# Patient Record
Sex: Male | Born: 1940 | Race: Black or African American | Hispanic: No | Marital: Married | Smoking: Former smoker
Health system: Southern US, Community
[De-identification: ages and names within clinical notes are randomized; demographics above are authoritative.]

## PROBLEM LIST (undated history)

## (undated) DIAGNOSIS — E785 Hyperlipidemia, unspecified: Secondary | ICD-10-CM

## (undated) DIAGNOSIS — E119 Type 2 diabetes mellitus without complications: Secondary | ICD-10-CM

## (undated) DIAGNOSIS — D126 Benign neoplasm of colon, unspecified: Secondary | ICD-10-CM

## (undated) DIAGNOSIS — G4730 Sleep apnea, unspecified: Secondary | ICD-10-CM

## (undated) DIAGNOSIS — I10 Essential (primary) hypertension: Secondary | ICD-10-CM

## (undated) HISTORY — DX: Hyperlipidemia, unspecified: E78.5

## (undated) HISTORY — DX: Sleep apnea, unspecified: G47.30

---
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
## 2000-11-21 ENCOUNTER — Encounter: Payer: Self-pay | Admitting: *Deleted

## 2000-11-21 ENCOUNTER — Emergency Department (HOSPITAL_COMMUNITY): Admission: EM | Admit: 2000-11-21 | Discharge: 2000-11-21 | Payer: Self-pay | Admitting: *Deleted

## 2000-11-29 ENCOUNTER — Ambulatory Visit (HOSPITAL_COMMUNITY): Admission: RE | Admit: 2000-11-29 | Discharge: 2000-11-29 | Payer: Self-pay | Admitting: Cardiology

## 2001-04-22 ENCOUNTER — Emergency Department (HOSPITAL_COMMUNITY): Admission: EM | Admit: 2001-04-22 | Discharge: 2001-04-22 | Payer: Self-pay

## 2001-05-23 ENCOUNTER — Encounter: Payer: Self-pay | Admitting: Urology

## 2001-05-23 ENCOUNTER — Ambulatory Visit (HOSPITAL_BASED_OUTPATIENT_CLINIC_OR_DEPARTMENT_OTHER): Admission: RE | Admit: 2001-05-23 | Discharge: 2001-05-23 | Payer: Self-pay | Admitting: Urology

## 2005-06-07 ENCOUNTER — Ambulatory Visit: Payer: Self-pay | Admitting: Orthopedic Surgery

## 2005-07-14 ENCOUNTER — Ambulatory Visit: Payer: Self-pay | Admitting: Orthopedic Surgery

## 2006-06-13 ENCOUNTER — Ambulatory Visit: Payer: Self-pay | Admitting: Orthopedic Surgery

## 2006-06-22 ENCOUNTER — Ambulatory Visit: Payer: Self-pay | Admitting: Gastroenterology

## 2006-06-22 LAB — CONVERTED CEMR LAB
Ferritin: 18.4 ng/mL — ABNORMAL LOW (ref 22.0–322.0)
Folate: >20 ng/mL
Iron: 64 ug/dL (ref 42–165)
Saturation Ratios: 17.1 % — ABNORMAL LOW (ref 20.0–50.0)
Transferrin: 266.7 mg/dL (ref >=212.0)
Vitamin B-12: 411 pg/mL (ref 211–911)

## 2006-07-03 DIAGNOSIS — D126 Benign neoplasm of colon, unspecified: Secondary | ICD-10-CM

## 2006-07-03 HISTORY — DX: Benign neoplasm of colon, unspecified: D12.6

## 2006-07-13 ENCOUNTER — Ambulatory Visit: Payer: Self-pay | Admitting: Gastroenterology

## 2006-07-13 ENCOUNTER — Encounter: Payer: Self-pay | Admitting: Gastroenterology

## 2006-07-13 LAB — CONVERTED CEMR LAB
Fecal Occult Blood: NEGATIVE
OCCULT 1: NEGATIVE
OCCULT 2: NEGATIVE
OCCULT 3: NEGATIVE
OCCULT 4: NEGATIVE
OCCULT 5: NEGATIVE

## 2008-11-11 DIAGNOSIS — M1990 Unspecified osteoarthritis, unspecified site: Secondary | ICD-10-CM

## 2008-11-11 HISTORY — DX: Unspecified osteoarthritis, unspecified site: M19.90

## 2009-02-01 HISTORY — PX: CATARACT EXTRACTION W/ INTRAOCULAR LENS  IMPLANT, BILATERAL: SHX1307

## 2010-06-16 NOTE — Assessment & Plan Note (Signed)
[facility]                         GASTROENTEROLOGY OFFICE NOTE   LORN, BUTCHER                         MRN:          [id number]  DATE:06/22/2006                            DOB:          [birth year]    OFFICE CONSULTATION:   REASON FOR REFERRAL:  Anemia.   HISTORY OF PRESENT ILLNESS:  Mr. Talarico is a very nice 70 year old AA male  referred through the courtesy of Dr. Jacky Kindle.  He underwent a screening  colonoscopy in March 2004 that showed internal hemorrhoids and three  small colon polyps, which proved to be hyperplastic.  During a routine  physical examination he was found to have a normocytic anemia with a  hemoglobin of 11.7 and an MCV of 82.  He denies any prior history of  anemia.  He occasionally has some mild itching related to his  hemorrhoids but he denies any rectal bleeding, change in stool caliber,  change in bowel habits, diarrhea, constipation, abdominal pain, rectal  pain, weight loss, nausea, vomiting or reflux symptoms.  He relates some  occasional dull left-sided chest pain that has been occurring  intermittently for about the past 2 to 3 years since he began Toprol.  There is no family history of colon cancer, colon polyps, or  inflammatory bowel disease.   PAST MEDICAL HISTORY:  1. Hypertension.  2. Diabetes mellitus.  3. Hyperlipidemia.  4. Internal hemorrhoids.  5. Hyperplastic colon polyps.  6. Status post circumcision in 2003.   CURRENT MEDICATIONS:  Listed on the chart, updated and reviewed.   MEDICATION ALLERGIES:  None known.   SOCIAL HISTORY:  Per the handwritten form.   REVIEW OF SYSTEMS:  Per the handwritten form.   PHYSICAL EXAMINATION:  GENERAL:  This is an overweight African-American  male in no acute distress.  VITAL SIGNS:  Height is 6 feet, weight 228.2 pounds.  Blood pressure is  146/66, pulse 88 and regular.  HEENT:  Anicteric sclerae.  Oropharynx clear.  CHEST:  Clear to auscultation  bilaterally.  CARDIAC:  Regular rate and rhythm without murmurs appreciated.  ABDOMEN:  Soft, nontender, nondistended, normoactive bowel sounds.  No  palpable organomegaly, masses or hernias.  RECTAL:  Deferred to the time of colonoscopy.  EXTREMITIES:  Without clubbing, cyanosis, or edema.  NEUROLOGIC:  Alert and oriented x3.  Grossly nonfocal.   ASSESSMENT AND PLAN:  Normocytic anemia.  Obtain iron, B12 and folate  studies.  Rule out occult gastrointestinal losses.  Obtain stool  Hemoccults.  Risks, benefits, and alternatives to colonoscopy with  possible biopsy and possible polypectomy and upper endoscopy with  possible biopsy discussed with the patient.  He consents to proceed and  this will be scheduled electively.     Venita Lick. Russella Dar, MD, [facility]  Electronically Signed    MTS/MedQ  DD: 06/22/2006  DT: 06/22/2006  Job #: [id number]   cc:   Geoffry Paradise, M.D.

## 2010-06-19 NOTE — Cardiovascular Report (Signed)
[place]. [facility]  Patient:    ROTH, RESS Visit Number: [id number] MRN: [id number]          Service Type: CAT Location: [facility] 2855 [id number] Attending Physician:  [place], Peter Manning Dictated by:   Peter M. [place], M.D. Proc. Date: 11/29/00 Admit Date:  11/29/2000 Discharge Date: 11/29/2000                          Cardiac Catheterization  INDICATIONS FOR PROCEDURE: The patient is a 70 year old black male with a history of diabetes mellitus, elevated blood pressure, mild hyperlipidemia, who has recent onset of chest pain. Stress test was abnormal.  ACCESS: Via the right femoral artery using the standard Seldinger technique.  EQUIPMENT: The 6 French 4 cm right and left Judkins catheter, 6 French pigtail catheter, 6 French arterial sheath.  MEDICATIONS: Local anesthesia with 1% Xylocaine.  CONTRAST: Omnipaque 120 cc.  HEMODYNAMIC DATA: Aortic pressure is 138/81 with a mean of 106.  Left ventricular pressure is 135 with an EDP of 17 mmHg.  ANGIOGRAPHIC DATA: Left coronary artery: The left coronary artery arises and distributes normally.  Left main: The left main coronary artery is normal.  Left anterior descending: The left anterior descending artery is normal. It supplies the two diagonal branches which are moderate in size and are also normal.  Left circumflex: The left circumflex coronary artery gives rise to a single marginal branch and this is normal.  Right coronary artery: The right coronary artery arises and distributes normally. It is a dominant vessel. It has minor wall irregularities in the proximal and mid vessel less than 10%.  LEFT VENTRICULAR ANGIOGRAPHY: The left ventricular angiography performed in the RAO view demonstrates normal left ventricular size and contractility with normal systolic function.  Ejection fraction is calculated at 67%. There is mild mitral insufficiency. There is no evidence of mitral valve prolapse.   The aortic valve appears normal.  FINAL INTERPRETATION: 1. No significant atherosclerotic coronary artery disease. 2. Normal left ventricular function. Dictated by:   Peter M. [place], M.D. Attending Physician:  [place], Peter Manning DD:  11/29/00 TD:  11/29/00 Job: [id number]

## 2010-06-19 NOTE — Op Note (Signed)
[facility]  Patient:    Carlos Gill, Carlos Gill Visit Number: [id number] MRN: [id number]          Service Type: NES Location: [facility] Attending Physician:  Evlyn Clines Dictated by:   Excell Seltzer. Annabell Howells, M.D. Proc. Date: 05/23/01 Admit Date:  05/23/2001   CC:         Richard A. Jacky Kindle, M.D.   Operative Report  PREOPERATIVE DIAGNOSIS:  Phimosis.  POSTOPERATIVE DIAGNOSIS:  Phimosis.  PROCEDURE:  Circumcision.  SURGEON:  Excell Seltzer. Annabell Howells, M.D.  ANESTHESIA:  General.  COMPLICATIONS:  None.  INDICATIONS:  The patient is a 70 year old black male with diabetes, who developed phimosis, and is to undergo circumcision.  FINDINGS AND DESCRIPTION OF PROCEDURE:  The patient was taken to the operating room where a general anesthetic was induced.  He was left in the supine position.  The penis and genitalia was prepped with Betadine solution, and he was draped in the usual sterile fashion.  A sleeve resection of the redundant foreskin was performed.  Hemostasis was achieved with the needle-tip Bovie. The skin edges were then reapproximated using a running 4-0 chromic interrupted in the quadrants.  At the completion of the circumcision, the patient was noted to have an appropriate amount of skin on stretch length to avoid any scrotal tethering with erection.  There was slightly redundancy when it was slashed, but I felt it best to leave it a little bit loose to ensure that he did not have tethering with the erection.  A dressing of Xeroform, Kling, and Coban was applied.  Approximately 8 cc of 1% lidocaine without epinephrine was used to perform a penile block for postoperative pain control.  His anesthetic was reversed.  He was taken to the recovery room in stable condition and there were no complications. Dictated by:   Excell Seltzer. Annabell Howells, M.D. Attending Physician:  Evlyn Clines DD:  05/23/01 TD:  05/23/01 Job: [id number]

## 2010-06-19 NOTE — H&P (Signed)
[place]. [facility]  Patient:    Carlos Gill, Carlos Gill Visit Number: [id number] MRN: [id number]          Service Type: EMS Location: [facility] Attending Physician:  Carmelina Peal Dictated by:   Peter M. [place], M.D. Admit Date:  11/21/2000 Discharge Date: 11/21/2000   CC:         Richard A. Jacky Kindle, M.D.                         History and Physical  CHIEF COMPLAINT: Chest pain.  HISTORY OF PRESENT ILLNESS: Carlos Gill is a 70 year old black male, with a history of diabetes mellitus, type 2, who presented for evaluation of chest pain.  The patient reports a one week history of substernal chest pain.  This is described as a pain and a pressure in the middle of his chest, radiating to his left arm.  It is worse with exertion but also has occurred with rest.  The patient was seen in the emergency room on November 21, 2000.  At that time his cardiac evaluation was unremarkable, with negative cardiac enzymes and ECG, and he was discharged home for further evaluation.  He was subsequently evaluated here on November 24, 2000.  He was started on Toprol XL and aspirin. He was scheduled for a stress Cardiolite study, which was performed on November 28, 2000.  On this study he was able to exercise for six minutes on the Bruce protocol.  He complained of marked fatigue and dyspnea.  He had no chest pain. ECG showed 3 mm of ST segment depression inferolaterally consistent with ischemia.  His Cardiolite study suggested apical ischemia.  He is now admitted for cardiac catheterization.  PAST MEDICAL HISTORY:  1. Diabetes mellitus, type 2.  2. History of mild hypercholesterolemia.  3. History of elevated blood pressure, but has not been treated for     hypertension in the past.  ALLERGIES: None known.  PAST SURGICAL HISTORY: No prior surgeries.  CURRENT MEDICATIONS:  1. Glucovance 25/500 mg b.i.d.  2. Toprol XL 50 mg q.d.  3. Aspirin q.d.  SOCIAL HISTORY: The patient is retired.   He is widowed.  He has one child, in good health.  He quit smoking ten years ago.  He denies alcohol use.  FAMILY HISTORY: Father died at age 72 of suicide.  Mother died at 61 of unknown causes.  One brother died with a brain aneurysm and one died in an accident.  Five other siblings are alive and well.  REVIEW OF SYSTEMS: The patient does note some increased fatigability and shortness of breath.  He has had no edema or orthopnea.  No history of TIA or stroke.  No claudication symptoms.  All other Review Of Systems are negative.  PHYSICAL EXAMINATION:  GENERAL: The patient is a pleasant white male, in no apparent distress.  VITAL SIGNS: Weight 224 pounds.  Blood pressure 140/80, pulse 68 and regular.  HEENT: Unremarkable.  PERRLA.  Extraocular movements were full.  Oropharynx clear.  NECK: Supple without JVD, adenopathy, thyromegaly, or bruits.  LUNGS: Clear to auscultation and percussion.  CARDIAC: Regular rate and rhythm without gallops, murmurs, rubs, or clicks.  ABDOMEN: Soft, nontender.  No hepatosplenomegaly, masses, or bruits.  EXTREMITIES: Femoral and pedal pulses are 2+ and symmetric.  No edema.  NEUROLOGIC: Nonfocal.  ASSESSMENT:  1. Recent onset of angina pectoris with abnormal stress Cardiolite study.  2. Diabetes mellitus, type 2.  3. Mild  hypercholesterolemia.  4. Hypertension.  PLAN: The patient will be admitted for cardiac catheterization, with further therapy pending these results. Dictated by:   Peter M. [place], M.D. Attending Physician:  Carmelina Peal DD:  11/28/00 TD:  11/29/00 Job: [id number]

## 2010-10-08 ENCOUNTER — Telehealth: Payer: Self-pay | Admitting: Cardiology

## 2010-10-08 NOTE — Telephone Encounter (Signed)
Nevermind the chart was purged.

## 2010-10-15 ENCOUNTER — Other Ambulatory Visit: Payer: Self-pay | Admitting: Specialist

## 2010-10-15 ENCOUNTER — Encounter (HOSPITAL_COMMUNITY): Payer: Medicare Other

## 2010-10-15 ENCOUNTER — Other Ambulatory Visit (HOSPITAL_COMMUNITY): Payer: Self-pay | Admitting: Specialist

## 2010-10-15 ENCOUNTER — Ambulatory Visit (HOSPITAL_COMMUNITY)
Admission: RE | Admit: 2010-10-15 | Discharge: 2010-10-15 | Disposition: A | Discharge status: Home / Self Care | Payer: Medicare Other | Source: Ambulatory Visit | Attending: Specialist | Admitting: Specialist

## 2010-10-15 DIAGNOSIS — Z01812 Encounter for preprocedural laboratory examination: Secondary | ICD-10-CM | POA: Insufficient documentation

## 2010-10-15 DIAGNOSIS — Z01818 Encounter for other preprocedural examination: Secondary | ICD-10-CM

## 2010-10-15 DIAGNOSIS — M7500 Adhesive capsulitis of unspecified shoulder: Secondary | ICD-10-CM | POA: Insufficient documentation

## 2010-10-15 DIAGNOSIS — M25819 Other specified joint disorders, unspecified shoulder: Secondary | ICD-10-CM | POA: Insufficient documentation

## 2010-10-15 LAB — BASIC METABOLIC PANEL
BUN: 23 mg/dL (ref 6–23)
CO2: 25 mEq/L (ref 19–32)
Calcium: 9.9 mg/dL (ref 8.4–10.5)
Chloride: 105 mEq/L (ref 96–112)
Creatinine, Ser: 1.53 mg/dL — ABNORMAL HIGH (ref 0.50–1.35)
GFR calc Af Amer: 55 mL/min — ABNORMAL LOW (ref >60)
GFR calc non Af Amer: 45 mL/min — ABNORMAL LOW (ref >60)
Glucose, Bld: 78 mg/dL (ref 70–99)
Potassium: 4.3 mEq/L (ref 3.5–5.1)
Sodium: 138 mEq/L (ref 135–145)

## 2010-10-15 LAB — SURGICAL PCR SCREEN
MRSA, PCR: NEGATIVE
Staphylococcus aureus: NEGATIVE

## 2010-10-15 LAB — CBC
HCT: 36.9 % — ABNORMAL LOW (ref 39.0–52.0)
Hemoglobin: 12.2 g/dL — ABNORMAL LOW (ref 13.0–17.0)
MCH: 27 pg (ref 26.0–34.0)
MCHC: 33.1 g/dL (ref 30.0–36.0)
MCV: 81.6 fL (ref 78.0–100.0)
Platelets: 189 10*3/uL (ref 150–400)
RBC: 4.52 MIL/uL (ref 4.22–5.81)
RDW: 13.6 % (ref 11.5–15.5)
WBC: 4.5 10*3/uL (ref 4.0–10.5)

## 2010-10-15 IMAGING — CR DG CHEST 2V
2 series · 2 of 2 positions shown · non-contrast
Comparison: None.

CLINICAL DATA: Preop, shoulder surgery

CHEST - 2 VIEW

[w chest pa]
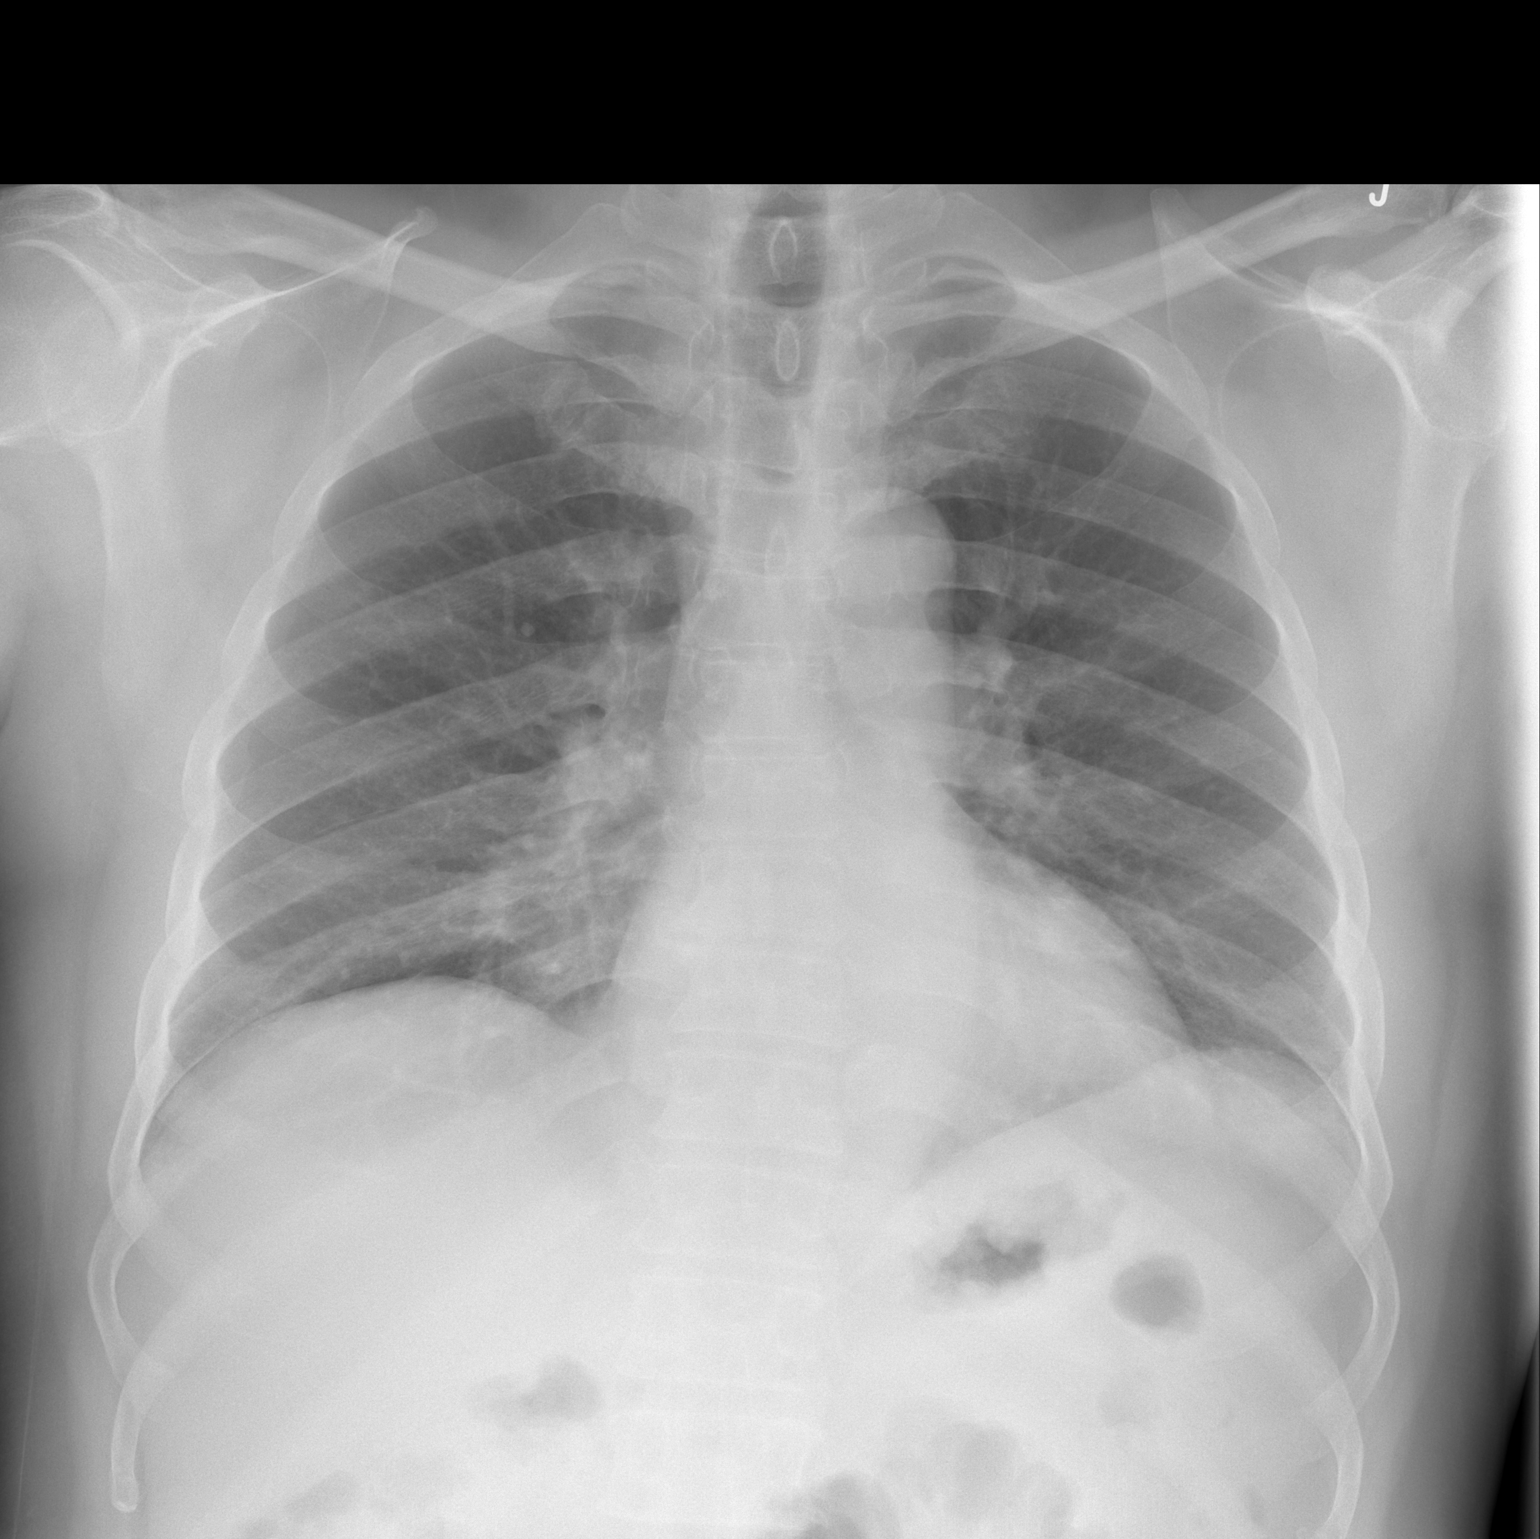

[w chest lat]
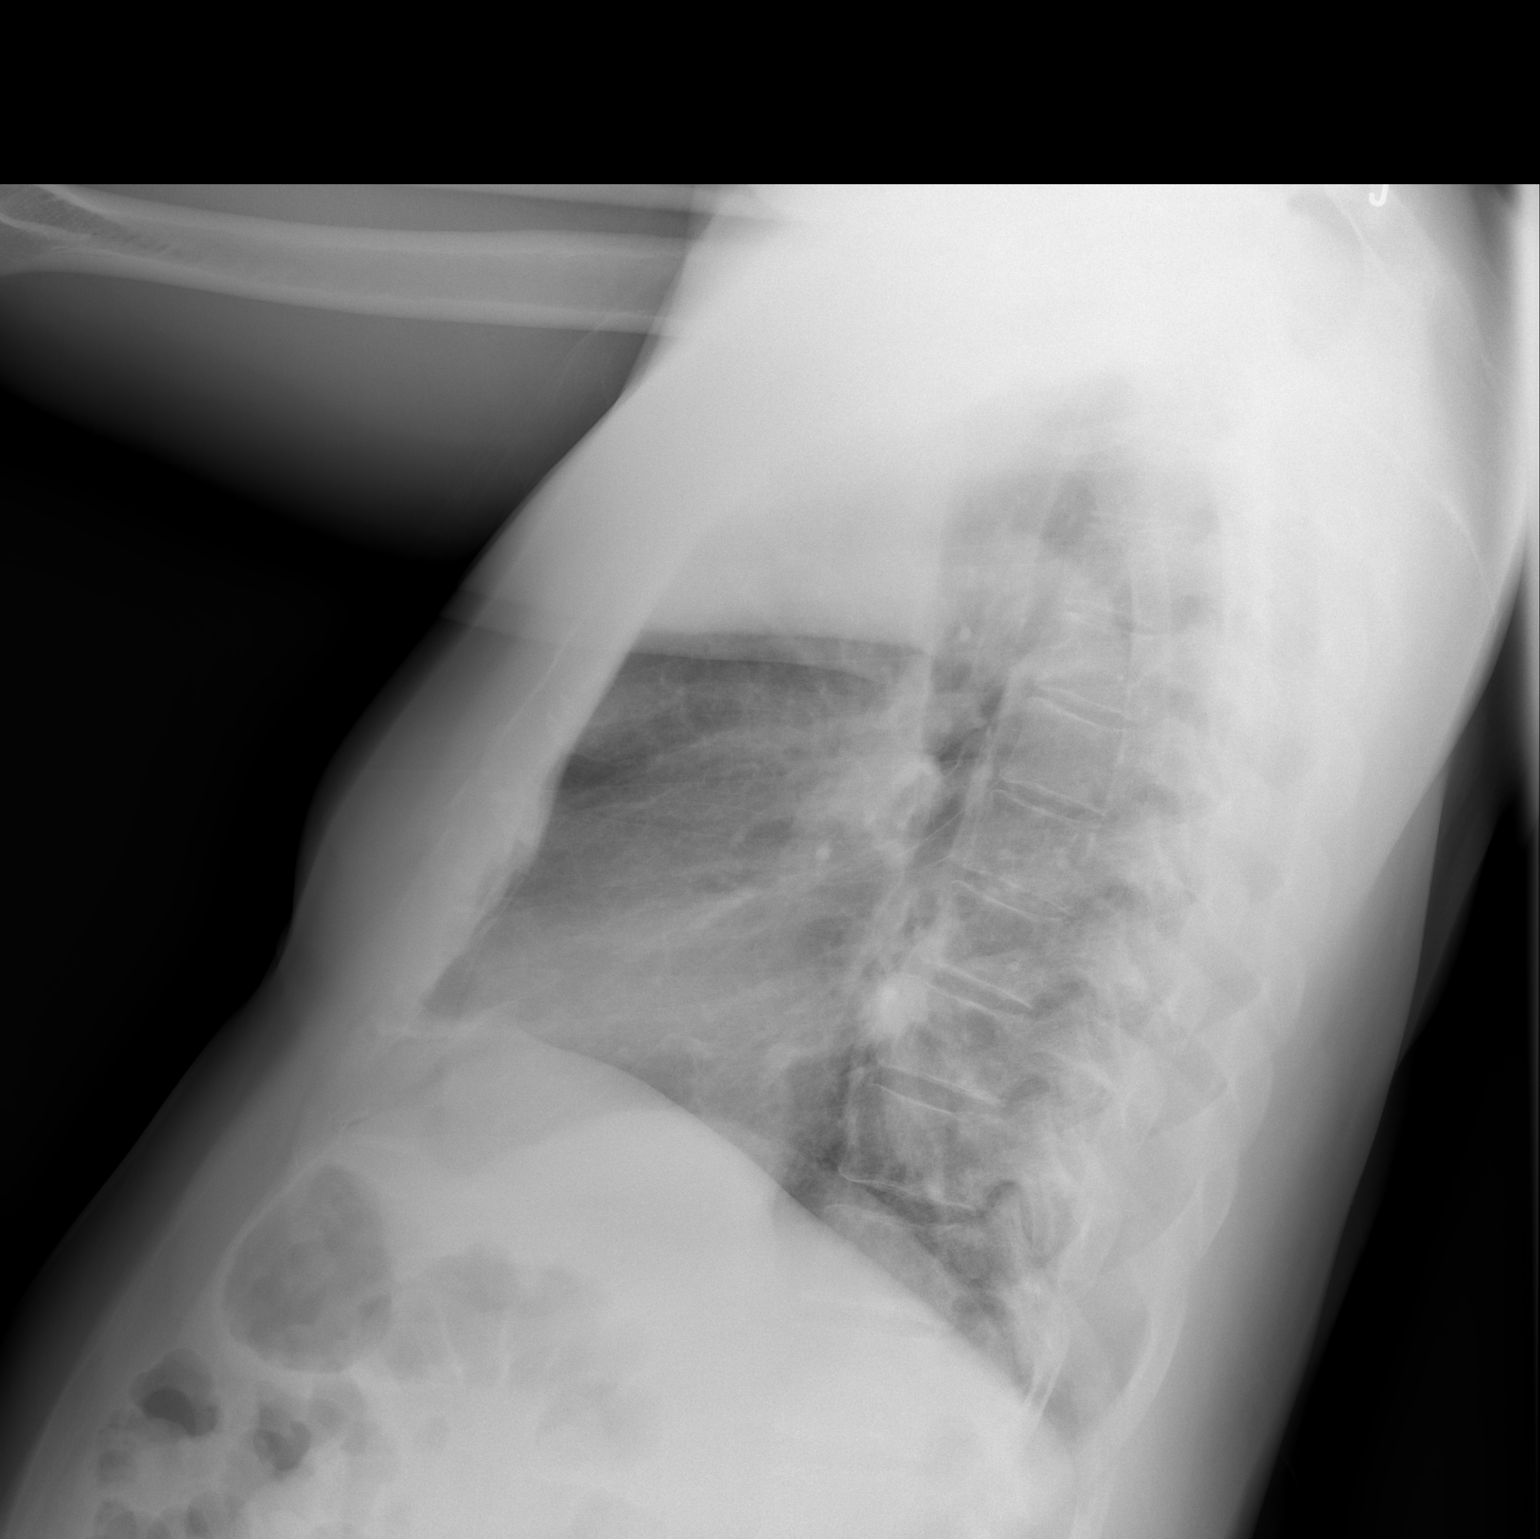

[2 of 2 positions shown; findings below may reference images not displayed]

FINDINGS: Normal mediastinum and heart silhouette.  Costophrenic
angles are clear.  No evidence effusion, infiltrate, or
pneumothorax.  The lateral projection there is a infrahilar nodular
density projecting over the anterior margin of the thoracic spine.
This is not placed on the anterior projection.
IMPRESSION: 1.  No acute cardiopulmonary process.
2.  Nodular density on the lateral projection is indeterminate.
Recommend CT of the thorax for further evaluation.

## 2010-10-22 ENCOUNTER — Ambulatory Visit (HOSPITAL_COMMUNITY): Payer: Medicare Other

## 2010-10-22 ENCOUNTER — Ambulatory Visit (HOSPITAL_COMMUNITY)
Admission: RE | Admit: 2010-10-22 | Discharge: 2010-10-22 | Disposition: A | Discharge status: Home / Self Care | Payer: Medicare Other | Source: Ambulatory Visit | Attending: Specialist | Admitting: Specialist

## 2010-10-22 DIAGNOSIS — E109 Type 1 diabetes mellitus without complications: Secondary | ICD-10-CM | POA: Insufficient documentation

## 2010-10-22 DIAGNOSIS — R748 Abnormal levels of other serum enzymes: Secondary | ICD-10-CM | POA: Insufficient documentation

## 2010-10-22 DIAGNOSIS — M25819 Other specified joint disorders, unspecified shoulder: Secondary | ICD-10-CM | POA: Insufficient documentation

## 2010-10-22 DIAGNOSIS — Z01810 Encounter for preprocedural cardiovascular examination: Secondary | ICD-10-CM | POA: Insufficient documentation

## 2010-10-22 DIAGNOSIS — M7500 Adhesive capsulitis of unspecified shoulder: Secondary | ICD-10-CM | POA: Insufficient documentation

## 2010-10-22 DIAGNOSIS — Z01812 Encounter for preprocedural laboratory examination: Secondary | ICD-10-CM | POA: Insufficient documentation

## 2010-10-22 DIAGNOSIS — I10 Essential (primary) hypertension: Secondary | ICD-10-CM | POA: Insufficient documentation

## 2010-10-22 LAB — GLUCOSE, CAPILLARY
Glucose-Capillary: 132 mg/dL — ABNORMAL HIGH (ref 70–99)
Glucose-Capillary: 158 mg/dL — ABNORMAL HIGH (ref 70–99)

## 2010-10-22 LAB — BASIC METABOLIC PANEL
BUN: 31 mg/dL — ABNORMAL HIGH (ref 6–23)
CO2: 24 mEq/L (ref 19–32)
Calcium: 9.1 mg/dL (ref 8.4–10.5)
Chloride: 105 mEq/L (ref 96–112)
Creatinine, Ser: 1.58 mg/dL — ABNORMAL HIGH (ref 0.50–1.35)
Glucose, Bld: 137 mg/dL — ABNORMAL HIGH (ref 70–99)

## 2010-10-23 NOTE — Op Note (Signed)
NAMEASHDON, GILLSON                  ACCOUNT NO.:  [id number]  MEDICAL RECORD NO.:  [id number]  LOCATION:  [facility]                         FACILITY:  [facility]  PHYSICIAN:  Jene Every, M.D.    DATE OF BIRTH:  08/08/1940  DATE OF PROCEDURE: DATE OF DISCHARGE:                              OPERATIVE REPORT   PREOPERATIVE DIAGNOSES:  Bilateral adhesive capsulitis, shoulders and impingement syndrome, left shoulder.  POSTOPERATIVE DIAGNOSES:  Bilateral adhesive capsulitis, shoulders and impingement syndrome, left shoulder.  PROCEDURES PERFORMED: 1. Bilateral exam under anesthesia of the shoulders. 2. Bilateral manipulation under anesthesia, shoulders. 3. Left shoulder arthroscopy, subacromial decompression,     acromioplasty, debridement, partial tear of the rotator cuff.  ANESTHESIA:  General.  ASSISTANT:  Roma Schanz, P.A.  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 Arthroscopy evaluation of the rotator cuff repair of significant tear was noted.  Risks and benefits were discussed including bleeding, infection, suboptimal range of motion, need for repeat manipulation, DVT, PE, anesthetic complications etc.  TECHNIQUE:  The patient in supine position after adequate general anesthesia 2 g Kefzol, examined the right shoulder under anesthesia.  He had 90 degrees of forward flexion and 90 degrees of abduction and 30 degrees of external rotation and internal rotation was full.  With the scapula stabilized, I performed a manipulative maneuver in the forward flexion plane, securing the humerus proximally.  With gentle manipulative maneuver, I was able to appreciate lysis of adhesions  and an increase in his forward flexion 140-160 degrees.  I then performed a manipulative maneuver in the abduction plane with the scapulothoracic region stabilized by abducting and then improved him to 120 degrees of abduction.  Then again in securing the humerus proximal and stabilizing the scapulothoracic region, we performed internal-external rotation manipulative maneuvers as well, increasing the range of motion of both. The neck and head was in the neutral position, retractor was applied to the extremity.  Next, attention was turned towards the contralateral side in a similar fashion.  We decreased forward flexion to 90 and abduction to 90, decreased external rotation as well.  Performed the same manipulative maneuver and increased his forward flexion to 160 degrees, abduction to 120 degrees, external rotation of 30 degrees, and internal rotation slightly diminished.  No significant pressure was applied to this and humerus was moving as one unit, when the final range of motion was evaluated.  Then placed the patient in the upright beach- chair position, and prepped and draped in the left upper extremity in usual sterile fashion, used a marker to delineate acromiocoracoid in the [facility] joint.  Then, I made small incision to the skin only with a #11 blade posterolaterally and anterolaterally.  With the arm in a 70-30 position with gentle traction applied by the assistant, I advanced the arthroscopic cannula in the glenohumeral joint, penetrating atraumatically in line with the coracoid.  Irrigant was utilized to insufflate the joint, we used 65 mmHg.  The joint was copiously lavaged. There was no evidence of rotator cuff tear, there was degenerative changes to glenohumeral joint, labral fraying was noted throughout.  The biceps tendon was intact as was the subscap.  I copiously lavaged the joint.  Next, we redirected camera in the subacromial space triangulating.  There was hypertrophic  and exuberant bursa, which was excised with a shaver 3.5 Cuda shaver.  However , it was fairly tight. We released the CA and morselized the CA ligament, performed acromioplasty and anterolateral aspect at the acromion.  Mild fraying of the rotator cuff was debrided as well.  Good bleeding tissue was noted. We had performed a full bursectomy and following this, there was ample room in the subacromial space.  No evidence of a significant tear requiring repair, and this was probed extensively.  Removed all instrumentations, portals were closed with 4-0 nylon simple sutures. 0.25% Marcaine with epinephrine was infiltrated.  The joint was dressed sterilely without difficulty, transported to recovery in satisfactory condition.  The patient tolerated the procedure well.  There were no complications. Minimal blood loss.     Jene Every, M.D.     Cordelia Pen  D:  10/22/2010  T:  10/22/2010  Job:  [id number]  Electronically Signed by Jene Every M.D. on 10/23/2010 03:11:12 PM

## 2010-11-05 DIAGNOSIS — E1121 Type 2 diabetes mellitus with diabetic nephropathy: Secondary | ICD-10-CM

## 2010-11-05 DIAGNOSIS — E1165 Type 2 diabetes mellitus with hyperglycemia: Secondary | ICD-10-CM | POA: Insufficient documentation

## 2010-11-05 HISTORY — DX: Type 2 diabetes mellitus with diabetic nephropathy: E11.21

## 2011-04-28 ENCOUNTER — Encounter: Payer: Self-pay | Admitting: Gastroenterology

## 2012-02-02 HISTORY — PX: ACHILLES TENDON REPAIR: SUR1153

## 2012-05-05 ENCOUNTER — Encounter: Payer: Self-pay | Admitting: Gastroenterology

## 2012-06-03 ENCOUNTER — Emergency Department (HOSPITAL_COMMUNITY): Payer: Medicare Other

## 2012-06-03 ENCOUNTER — Encounter (HOSPITAL_COMMUNITY): Payer: Self-pay | Admitting: Emergency Medicine

## 2012-06-03 ENCOUNTER — Emergency Department (HOSPITAL_COMMUNITY)
Admission: EM | Admit: 2012-06-03 | Discharge: 2012-06-03 | Disposition: A | Discharge status: Home / Self Care | Payer: Medicare Other | Attending: Emergency Medicine | Admitting: Emergency Medicine

## 2012-06-03 DIAGNOSIS — M66369 Spontaneous rupture of flexor tendons, unspecified lower leg: Secondary | ICD-10-CM | POA: Insufficient documentation

## 2012-06-03 DIAGNOSIS — Z79899 Other long term (current) drug therapy: Secondary | ICD-10-CM | POA: Insufficient documentation

## 2012-06-03 DIAGNOSIS — W010XXA Fall on same level from slipping, tripping and stumbling without subsequent striking against object, initial encounter: Secondary | ICD-10-CM | POA: Insufficient documentation

## 2012-06-03 DIAGNOSIS — I10 Essential (primary) hypertension: Secondary | ICD-10-CM | POA: Insufficient documentation

## 2012-06-03 DIAGNOSIS — Y939 Activity, unspecified: Secondary | ICD-10-CM | POA: Insufficient documentation

## 2012-06-03 DIAGNOSIS — S86012A Strain of left Achilles tendon, initial encounter: Secondary | ICD-10-CM

## 2012-06-03 DIAGNOSIS — Z794 Long term (current) use of insulin: Secondary | ICD-10-CM | POA: Insufficient documentation

## 2012-06-03 DIAGNOSIS — Z7982 Long term (current) use of aspirin: Secondary | ICD-10-CM | POA: Insufficient documentation

## 2012-06-03 DIAGNOSIS — Y929 Unspecified place or not applicable: Secondary | ICD-10-CM | POA: Insufficient documentation

## 2012-06-03 DIAGNOSIS — E119 Type 2 diabetes mellitus without complications: Secondary | ICD-10-CM | POA: Insufficient documentation

## 2012-06-03 HISTORY — DX: Type 2 diabetes mellitus without complications: E11.9

## 2012-06-03 HISTORY — DX: Essential (primary) hypertension: I10

## 2012-06-03 MED ORDER — IBUPROFEN 800 MG PO TABS: 800.0000 mg | ORAL_TABLET | Freq: Three times a day (TID) | ORAL | Status: DC

## 2012-06-03 MED ORDER — HYDROCODONE-ACETAMINOPHEN 5-325 MG PO TABS: 2.0000 | ORAL_TABLET | ORAL | Status: DC | PRN

## 2012-06-03 NOTE — ED Notes (Signed)
States that he tripped over a gait and landed on his left knee.  States that he is having left knee and left lower leg pain since then.

## 2012-06-03 NOTE — ED Provider Notes (Signed)
History  This chart was scribed for Carlos Octave, MD by Carlos Gill, ED Scribe. This patient was seen in room APA04/APA04 and the patient's care was started at 3:05 PM.  CSN: [id number]  Arrival date & time 06/03/12  1445   First MD Initiated Contact with Patient 06/03/12 1455      Chief Complaint  Patient presents with  . Fall  . Knee Pain  . Leg Pain    The history is provided by the patient. No language interpreter was used.    HPI Comments: Carlos Gill is a 72 y.o. male who presents to the Emergency Department complaining of a trip and fall today with associated left knee pain since then. He reports that he was stepping over a dog gait when his left foot got caught and sent him landing on his left knee. He denies hearing or feeling a "pop" or "crack". He denies LOC and head trauma. He reports that he has been using a cane to ambulate since the incident but at baseline walks unassisted. He denies HA, back pain, CP, abdominal pain and neck pain as associated symptoms. He is currently on ASA but denies being on any other anticoagulants. He has a h/o HTN and DM.   PCP is Dr. Jacky Gill  Past Medical History  Diagnosis Date  . Hypertension   . Diabetes mellitus without complication     Past Surgical History  Procedure Laterality Date  . Eye surgery      No family history on file.  History  Substance Use Topics  . Smoking status: Not on file  . Smokeless tobacco: Not on file  . Alcohol Use: Not on file      Review of Systems  A complete 10 system review of systems was obtained and all systems are negative except as noted in the HPI and PMH.   Allergies  Review of patient's allergies indicates no known allergies.  Home Medications   Current Outpatient Rx  Name  Route  Sig  Dispense  Refill  . aspirin EC 81 MG tablet   Oral   Take 81 mg by mouth every morning.         Marland Kitchen gemfibrozil (LOPID) 600 MG tablet   Oral   Take 600 mg by mouth 2 (two) times  daily.         . insulin aspart (NOVOLOG) 100 UNIT/ML injection   Subcutaneous   Inject 6-18 Units into the skin 3 (three) times daily with meals.         . insulin glargine (LANTUS) 100 units/mL SOLN   Subcutaneous   Inject 50 Units into the skin at bedtime.         . Multiple Vitamin (MULTIVITAMIN WITH MINERALS) TABS   Oral   Take 1 tablet by mouth daily.         . Multiple Vitamins-Minerals (ICAPS) TABS   Oral   Take 1 tablet by mouth daily.         . potassium chloride SA (K-DUR,KLOR-CON) 20 MEQ tablet   Oral   Take 20 mEq by mouth daily.         . traMADol (ULTRAM) 50 MG tablet   Oral   Take 50 mg by mouth every 6 (six) hours as needed for pain.         Marland Kitchen triamterene-hydrochlorothiazide (MAXZIDE-25) 37.5-25 MG per tablet   Oral   Take 1 tablet by mouth daily.         Marland Kitchen  HYDROcodone-acetaminophen (NORCO/VICODIN) 5-325 MG per tablet   Oral   Take 2 tablets by mouth every 4 (four) hours as needed for pain.   10 tablet   0   . ibuprofen (ADVIL,MOTRIN) 800 MG tablet   Oral   Take 1 tablet (800 mg total) by mouth 3 (three) times daily.   21 tablet   0     Triage Vitals: BP 141/65  Pulse 92  Temp(Src) 97.3 F (36.3 C) (Oral)  Resp 20  Ht 6' (1.829 m)  Wt 238 lb (107.956 kg)  BMI 32.27 kg/m2  SpO2 97%  Physical Exam  Nursing note and vitals reviewed. Constitutional: He is oriented to person, place, and time. He appears well-developed and well-nourished. No distress.  HENT:  Head: Normocephalic and atraumatic.  Eyes: Conjunctivae and EOM are normal.  Neck: Neck supple. No tracheal deviation present.  Cardiovascular: Normal rate.   Pulmonary/Chest: Effort normal. No respiratory distress.  Musculoskeletal:  Thompson's test shows no plantar flexion and there is a palpable defect on the left leg Achilles tendon, weak plantar flexion on the left, full ROM of the left knee, intact DP and PT pulses, compartments are soft  Neurological: He is alert  and oriented to person, place, and time.  Skin: Skin is warm and dry.  Psychiatric: He has a normal mood and affect. His behavior is normal.    ED Course  Procedures (including critical care time)  DIAGNOSTIC STUDIES: Oxygen Saturation is 97% on room air, normal by my interpretation.    COORDINATION OF CARE: 3:13 PM-Advised pt that I believe that he has a left Achille's tendon tear. Discussed treatment plan which includes consult with orthopedist with pt at bedside and pt agreed to plan. Pt declined pain medications.  Labs Reviewed - No data to display Dg Ankle Complete Left  06/03/2012  *RADIOLOGY REPORT*  Clinical Data: Ankle pain status post fall today.  LEFT ANKLE COMPLETE - 3+ VIEW  Comparison: None.  Findings: The mineralization and alignment are normal.  There is no evidence of acute fracture or dislocation.  The joint spaces are maintained.  No focal soft tissue swelling is evident.  IMPRESSION: No acute osseous findings.   Original Report Authenticated By: Carlos Gill, M.D.    Dg Knee Complete 4 Views Left  06/03/2012  *RADIOLOGY REPORT*  Clinical Data: Knee pain status post fall today.  LEFT KNEE - COMPLETE 4+ VIEW  Comparison: None.  Findings: The mineralization and alignment are normal.  There is no evidence of acute fracture or dislocation.  There is mild medial joint space loss.  No significant joint effusion is demonstrated on the lateral view.  IMPRESSION: No acute osseous findings.  Mild medial compartment joint space loss.   Original Report Authenticated By: Carlos Gill, M.D.      1. Achilles tendon rupture, left, initial encounter       MDM  Mechanical fall with posterior L leg pain. Did not hit head or LOC.  No anticoagulants. No dizziness or lightheadedness.  Palpable achilles defect with weak plantar flexion on L.  D/w Dr. Hilda Gill.  Advised to have patient come to office on Monday morning.  Will place in fracture boot and crutches.  X-rays are negative for  fracture. We'll treat patient for Achilles tendon rupture. Followup with Dr. Hilda Gill on Monday.  I personally performed the services described in this documentation, which was scribed in my presence. The recorded information has been reviewed and is accurate.  Carlos Octave, MD 06/03/12 [phone]

## 2012-06-03 NOTE — ED Notes (Signed)
Pt alert & oriented x4, stable gait. Patient given discharge instructions, paperwork & prescription(s). Patient  instructed to stop at the registration desk to finish any additional paperwork. Patient verbalized understanding. Pt left department w/ no further questions. 

## 2013-08-30 ENCOUNTER — Encounter: Payer: Self-pay | Admitting: Gastroenterology

## 2013-10-30 ENCOUNTER — Ambulatory Visit: Payer: Medicare Other | Admitting: *Deleted

## 2013-10-30 VITALS — Ht 72.0 in | Wt 239.4 lb

## 2013-10-30 DIAGNOSIS — Z86010 Personal history of colonic polyps: Secondary | ICD-10-CM

## 2013-10-30 MED ORDER — MOVIPREP 100 G PO SOLR: ORAL | Status: DC

## 2013-10-30 NOTE — Progress Notes (Signed)
No allergies to eggs or soy. No problems with anesthesia.  Pt given Emmi instructions for colonoscopy  No oxygen use  No diet drug use  

## 2013-11-08 ENCOUNTER — Encounter: Payer: Self-pay | Admitting: Gastroenterology

## 2013-11-13 ENCOUNTER — Encounter: Payer: Self-pay | Admitting: Gastroenterology

## 2013-11-13 ENCOUNTER — Ambulatory Visit (AMBULATORY_SURGERY_CENTER): Payer: Medicare Other | Admitting: Gastroenterology

## 2013-11-13 VITALS — BP 139/79 | HR 66 | Temp 96.6°F | Resp 24 | Ht 72.0 in | Wt 239.0 lb

## 2013-11-13 DIAGNOSIS — D122 Benign neoplasm of ascending colon: Secondary | ICD-10-CM

## 2013-11-13 DIAGNOSIS — Z86010 Personal history of colonic polyps: Secondary | ICD-10-CM

## 2013-11-13 LAB — GLUCOSE, CAPILLARY
GLUCOSE-CAPILLARY: 154 mg/dL — AB (ref 70–99)
Glucose-Capillary: 134 mg/dL — ABNORMAL HIGH (ref 70–99)

## 2013-11-13 MED ORDER — SODIUM CHLORIDE 0.9 % IV SOLN
500.0000 mL | INTRAVENOUS | Status: DC
Start: 2013-11-13 — End: 2013-11-13

## 2013-11-13 NOTE — Patient Instructions (Signed)
Discharge instructions given with verbal understanding. Handouts on polyps,diverticulosis and hemorrhoids. Resume previous medications. YOU HAD AN ENDOSCOPIC PROCEDURE TODAY AT THE [facility]: Refer to the procedure report that was given to you for any specific questions about what was found during the examination.  If the procedure report does not answer your questions, please call your gastroenterologist to clarify.  If you requested that your care partner not be given the details of your procedure findings, then the procedure report has been included in a sealed envelope for you to review at your convenience later.  YOU SHOULD EXPECT: Some feelings of bloating in the abdomen. Passage of more gas than usual.  Walking can help get rid of the air that was put into your GI tract during the procedure and reduce the bloating. If you had a lower endoscopy (such as a colonoscopy or flexible sigmoidoscopy) you may notice spotting of blood in your stool or on the toilet paper. If you underwent a bowel prep for your procedure, then you may not have a normal bowel movement for a few days.  DIET: Your first meal following the procedure should be a light meal and then it is ok to progress to your normal diet.  A half-sandwich or bowl of soup is an example of a good first meal.  Heavy or fried foods are harder to digest and may make you feel nauseous or bloated.  Likewise meals heavy in dairy and vegetables can cause extra gas to form and this can also increase the bloating.  Drink plenty of fluids but you should avoid alcoholic beverages for 24 hours.  ACTIVITY: Your care partner should take you home directly after the procedure.  You should plan to take it easy, moving slowly for the rest of the day.  You can resume normal activity the day after the procedure however you should NOT DRIVE or use heavy machinery for 24 hours (because of the sedation medicines used during the test).    SYMPTOMS TO REPORT  IMMEDIATELY: A gastroenterologist can be reached at any hour.  During normal business hours, 8:30 AM to 5:00 PM Monday through Friday, call [phone].  After hours and on weekends, please call the GI answering service at [phone] who will take a message and have the physician on call contact you.   Following lower endoscopy (colonoscopy or flexible sigmoidoscopy):  Excessive amounts of blood in the stool  Significant tenderness or worsening of abdominal pains  Swelling of the abdomen that is new, acute  Fever of 100F or higher  FOLLOW UP: If any biopsies were taken you will be contacted by phone or by letter within the next 1-3 weeks.  Call your gastroenterologist if you have not heard about the biopsies in 3 weeks.  Our staff will call the home number listed on your records the next business day following your procedure to check on you and address any questions or concerns that you may have at that time regarding the information given to you following your procedure. This is a courtesy call and so if there is no answer at the home number and we have not heard from you through the emergency physician on call, we will assume that you have returned to your regular daily activities without incident.  SIGNATURES/CONFIDENTIALITY: You and/or your care partner have signed paperwork which will be entered into your electronic medical record.  These signatures attest to the fact that that the information above on your After Visit Summary   has been reviewed and is understood.  Full responsibility of the confidentiality of this discharge information lies with you and/or your care-partner. 

## 2013-11-13 NOTE — Progress Notes (Signed)
Report to PACU, RN, vss, BBS= Clear.  

## 2013-11-13 NOTE — Op Note (Signed)
[place]  [address] [place], [id number]   COLONOSCOPY PROCEDURE REPORT  PATIENT: Carlos Gill, Carlos Gill  MR#: [id number] BIRTHDATE: [birth year] , 73  yrs. old GENDER: male ENDOSCOPIST: Ladene Artist, MD, [facility] PROCEDURE DATE:  11/13/2013 PROCEDURE:   Colonoscopy with snare polypectomy First Screening Colonoscopy - Avg.  risk and is 50 yrs.  old or older - No.  Prior Negative Screening - Now for repeat screening. N/A  History of Adenoma - Now for follow-up colonoscopy & has been > or = to 3 yrs.  Yes hx of adenoma.  Has been 3 or more years since last colonoscopy.  Polyps Removed Today? Yes. ASA CLASS:   Class III INDICATIONS:surveillance colonoscopy based on a history of adenomatous colonic polyp(s). MEDICATIONS: Monitored anesthesia care and Propofol 200 mg IV DESCRIPTION OF PROCEDURE:   After the risks benefits and alternatives of the procedure were thoroughly explained, informed consent was obtained.  The digital rectal exam revealed no abnormalities of the rectum.   The LB [id number]  endoscope was introduced through the anus and advanced to the cecum, which was identified by both the appendix and ileocecal valve. No adverse events experienced.   The quality of the prep was excellent, using MoviPrep  The instrument was then slowly withdrawn as the colon was fully examined.  COLON FINDINGS: A sessile polyp measuring 7 mm in size was found in the ascending colon.  A polypectomy was performed with a cold snare.  The resection was complete, the polyp tissue was completely retrieved and sent to histology.   There was mild diverticulosis noted in the transverse colon and ascending colon.   The examination was otherwise normal.  Retroflexed views revealed internal Grade II hemorrhoids. The time to cecum=1 minutes 56 seconds.  Withdrawal time=9 minutes 01 seconds.  The scope was withdrawn and the procedure completed. COMPLICATIONS: There were no immediate  complications.  ENDOSCOPIC IMPRESSION: 1.   Sessile polyp in the ascending colon; polypectomy performed with a cold snare 2.   Mild diverticulosis in the transverse colon and ascending colon  3.   Grade II internal hemorrhoids  RECOMMENDATIONS: 1.  Await pathology results 2.  High fiber diet with liberal fluid intake. 3.  Repeat Colonoscopy in 5 years.  eSigned:  Ladene Artist, MD, [facility] 11/13/2013 10:14 AM   cc: Burnard Bunting, MD

## 2013-11-13 NOTE — Progress Notes (Signed)
Called to room to assist during endoscopic procedure.  Patient ID and intended procedure confirmed with present staff. Received instructions for my participation in the procedure from the performing physician.  

## 2013-11-14 ENCOUNTER — Telehealth: Payer: Self-pay | Admitting: *Deleted

## 2013-11-14 NOTE — Telephone Encounter (Signed)
  Follow up Call-  Call back number 11/13/2013  Post procedure Call Back phone  # [phone]  Permission to leave phone message Yes     Patient questions:  Do you have a fever, pain , or abdominal swelling? No. Pain Score  0 *  Have you tolerated food without any problems? Yes.    Have you been able to return to your normal activities? Yes.    Do you have any questions about your discharge instructions: Diet   No. Medications  No. Follow up visit  No.  Do you have questions or concerns about your Care? No.  Actions: * If pain score is 4 or above: No action needed, pain <4.

## 2013-11-20 ENCOUNTER — Encounter: Payer: Self-pay | Admitting: Gastroenterology

## 2014-05-19 ENCOUNTER — Emergency Department (HOSPITAL_COMMUNITY): Payer: Medicare Other

## 2014-05-19 ENCOUNTER — Emergency Department (HOSPITAL_COMMUNITY)
Admission: EM | Admit: 2014-05-19 | Discharge: 2014-05-19 | Disposition: A | Discharge status: Home / Self Care | Payer: Medicare Other | Attending: Emergency Medicine | Admitting: Emergency Medicine

## 2014-05-19 ENCOUNTER — Encounter (HOSPITAL_COMMUNITY): Payer: Self-pay | Admitting: Emergency Medicine

## 2014-05-19 DIAGNOSIS — R1030 Lower abdominal pain, unspecified: Secondary | ICD-10-CM | POA: Diagnosis present

## 2014-05-19 DIAGNOSIS — Z7982 Long term (current) use of aspirin: Secondary | ICD-10-CM | POA: Insufficient documentation

## 2014-05-19 DIAGNOSIS — G4733 Obstructive sleep apnea (adult) (pediatric): Secondary | ICD-10-CM | POA: Insufficient documentation

## 2014-05-19 DIAGNOSIS — Z9981 Dependence on supplemental oxygen: Secondary | ICD-10-CM | POA: Diagnosis not present

## 2014-05-19 DIAGNOSIS — I10 Essential (primary) hypertension: Secondary | ICD-10-CM | POA: Diagnosis not present

## 2014-05-19 DIAGNOSIS — Z87891 Personal history of nicotine dependence: Secondary | ICD-10-CM | POA: Diagnosis not present

## 2014-05-19 DIAGNOSIS — Z791 Long term (current) use of non-steroidal anti-inflammatories (NSAID): Secondary | ICD-10-CM | POA: Insufficient documentation

## 2014-05-19 DIAGNOSIS — Z79899 Other long term (current) drug therapy: Secondary | ICD-10-CM | POA: Insufficient documentation

## 2014-05-19 DIAGNOSIS — N492 Inflammatory disorders of scrotum: Secondary | ICD-10-CM | POA: Diagnosis not present

## 2014-05-19 DIAGNOSIS — R609 Edema, unspecified: Secondary | ICD-10-CM

## 2014-05-19 DIAGNOSIS — E119 Type 2 diabetes mellitus without complications: Secondary | ICD-10-CM | POA: Diagnosis not present

## 2014-05-19 DIAGNOSIS — Z794 Long term (current) use of insulin: Secondary | ICD-10-CM | POA: Diagnosis not present

## 2014-05-19 DIAGNOSIS — Z792 Long term (current) use of antibiotics: Secondary | ICD-10-CM | POA: Insufficient documentation

## 2014-05-19 LAB — URINALYSIS, ROUTINE W REFLEX MICROSCOPIC
BILIRUBIN URINE: NEGATIVE
Glucose, UA: NEGATIVE mg/dL
Hgb urine dipstick: NEGATIVE
Ketones, ur: NEGATIVE mg/dL
LEUKOCYTES UA: NEGATIVE
NITRITE: NEGATIVE
Protein, ur: NEGATIVE mg/dL
Specific Gravity, Urine: 1.018 (ref 1.005–1.030)
Urobilinogen, UA: 0.2 mg/dL (ref 0.0–1.0)
pH: 5 (ref 5.0–8.0)

## 2014-05-19 LAB — BASIC METABOLIC PANEL
Anion gap: 12 (ref 5–15)
BUN: 28 mg/dL — AB (ref 6–23)
CHLORIDE: 103 mmol/L (ref 96–112)
CO2: 22 mmol/L (ref 19–32)
CREATININE: 1.72 mg/dL — AB (ref 0.50–1.35)
Calcium: 9.3 mg/dL (ref 8.4–10.5)
GFR calc Af Amer: 43 mL/min — ABNORMAL LOW (ref >90)
GFR calc non Af Amer: 37 mL/min — ABNORMAL LOW (ref >90)
Glucose, Bld: 125 mg/dL — ABNORMAL HIGH (ref 70–99)
Potassium: 4.2 mmol/L (ref 3.5–5.1)
Sodium: 137 mmol/L (ref 135–145)

## 2014-05-19 LAB — CBC
HCT: 35.4 % — ABNORMAL LOW (ref 39.0–52.0)
Hemoglobin: 11.8 g/dL — ABNORMAL LOW (ref 13.0–17.0)
MCH: 27 pg (ref 26.0–34.0)
MCHC: 33.3 g/dL (ref 30.0–36.0)
MCV: 81 fL (ref 78.0–100.0)
PLATELETS: 236 10*3/uL (ref 150–400)
RBC: 4.37 MIL/uL (ref 4.22–5.81)
RDW: 14.1 % (ref 11.5–15.5)
WBC: 8.1 10*3/uL (ref 4.0–10.5)

## 2014-05-19 MED ORDER — DOXYCYCLINE HYCLATE 100 MG PO TABS
100.0000 mg | ORAL_TABLET | Freq: Once | ORAL | Status: AC
  Administered 2014-05-19: 100 mg via ORAL
  Filled 2014-05-19: qty 1

## 2014-05-19 MED ORDER — DOXYCYCLINE HYCLATE 100 MG PO CAPS: 100.0000 mg | ORAL_CAPSULE | Freq: Two times a day (BID) | ORAL | Status: DC

## 2014-05-19 NOTE — ED Notes (Addendum)
C/o "knot"/swelling to L groin x 2 days.  Reports same symptoms approx 6 weeks ago.  Seen at [facility] hospital and states symptoms resolved after taking antibiotic.  Reports frequent urination but states that is normal for him.

## 2014-05-19 NOTE — Discharge Instructions (Signed)
Return to the ED with any concerns incuding fever/chills, increased area of pain or swelling, vomiting and not able to keep down liquids, decreased level of alertness/lethargy, or any other alarming symptoms

## 2014-05-19 NOTE — ED Notes (Addendum)
Called Carlos Gill.  They stated pt is up for Carlos Gill in 45 minutes.  Pt notified.

## 2014-05-19 NOTE — ED Provider Notes (Signed)
CSN: [id number]     Arrival date & time 05/19/14  1538 History   First MD Initiated Contact with Patient 05/19/14 1713     Chief Complaint  Patient presents with  . Groin Pain     (Consider location/radiation/quality/duration/timing/severity/associated sxs/prior Treatment) HPI  Pt presents with swelling and pain overlying left scrotum.  He states symptoms have been present for the past 2 days.  He has had similar symptoms several weeks ago- he was seen at [facility] and given course of antibiotics and this area completely resolved.  Wife thinks it may have been clindamycin. No dysuria, noabdominal pain, no vomiting or change in stools.  No fever/chills or other systemic symptoms.  Pain in the area is worse with palpation.  There are no other associated systemic symptoms, there are no other alleviating or modifying factors.   Past Medical History  Diagnosis Date  . Hypertension   . Diabetes mellitus without complication   . Sleep apnea     cpap  . Hyperlipidemia    Past Surgical History  Procedure Laterality Date  . Cataract extraction w/ intraocular lens  implant, bilateral  2011  . Achilles tendon repair Left 2014   Family History  Problem Relation Age of Onset  . Colon cancer Neg Hx   . Diabetes Mother   . Prostate cancer Maternal Grandfather    History  Substance Use Topics  . Smoking status: Former Smoker    Quit date: 02/01/1990  . Smokeless tobacco: Never Used  . Alcohol Use: No    Review of Systems  ROS reviewed and all otherwise negative except for mentioned in HPI    Allergies  Review of patient's allergies indicates no known allergies.  Home Medications   Prior to Admission medications   Medication Sig Start Date End Date Taking? Authorizing Provider  gemfibrozil (LOPID) 600 MG tablet Take 600 mg by mouth 2 (two) times daily.   Yes Historical Provider, MD  ACCU-CHEK AVIVA PLUS test strip  10/02/13   Historical Provider, MD  aspirin EC 81 MG tablet Take 81 mg by  mouth every morning.    Historical Provider, MD  doxycycline (VIBRAMYCIN) 100 MG capsule Take 1 capsule (100 mg total) by mouth 2 (two) times daily. 05/19/14   Alfonzo Beers, MD  ibuprofen (ADVIL,MOTRIN) 800 MG tablet Take 1 tablet (800 mg total) by mouth 3 (three) times daily. 06/03/12   Ezequiel Essex, MD  insulin aspart (NOVOLOG) 100 UNIT/ML injection Inject 6-18 Units into the skin 3 (three) times daily with meals.    Historical Provider, MD  insulin glargine (LANTUS) 100 units/mL SOLN Inject 30 Units into the skin 2 (two) times daily.     Historical Provider, MD  losartan (COZAAR) 100 MG tablet Take 100 mg by mouth daily.    Historical Provider, MD  meloxicam (MOBIC) 15 MG tablet Take 15 mg by mouth daily. 05/14/14   Historical Provider, MD  Multiple Vitamin (MULTIVITAMIN WITH MINERALS) TABS Take 1 tablet by mouth daily.    Historical Provider, MD  Multiple Vitamins-Minerals (ICAPS) TABS Take 1 tablet by mouth daily.    Historical Provider, MD  omeprazole (PRILOSEC) 20 MG capsule Take 20 mg by mouth daily.    Historical Provider, MD  potassium chloride SA (K-DUR,KLOR-CON) 20 MEQ tablet Take 20 mEq by mouth daily.    Historical Provider, MD  tamsulosin (FLOMAX) 0.4 MG CAPS capsule Take 0.4 mg by mouth. Takes 2 tablets daily    Historical Provider, MD  traMADol Veatrice Bourbon) 50  MG tablet Take 50 mg by mouth every 6 (six) hours as needed for pain.    Historical Provider, MD  triamterene-hydrochlorothiazide (MAXZIDE-25) 37.5-25 MG per tablet Take 1 tablet by mouth daily.    Historical Provider, MD  trospium (SANCTURA) 20 MG tablet Take 20 mg by mouth 2 (two) times daily.    Historical Provider, MD   BP 146/72 mmHg  Pulse 97  Temp(Src) 98.1 F (36.7 C) (Oral)  Resp 16  Ht 6' (1.829 m)  Wt 236 lb 7 oz (107.247 kg)  BMI 32.06 kg/m2  SpO2 100%  Vitals reviewed Physical Exam  Physical Examination: General appearance - alert, well appearing, and in no distress Mental status - alert, oriented to  person, place, and time Eyes - no conjunctival injection no scleral icterus Chest - clear to auscultation, no wheezes, rales or rhonchi, symmetric air entry Heart - normal rate, regular rhythm, normal S1, S2, no murmurs, rubs, clicks or gallops Abdomen - soft, nontender, nondistended, no masses or organomegaly GU Male - no penile lesions or discharge, ttp over thickened area of scrotal skin on left, no fluctuance, no drainage, no hernias, no testicular tenderness Extremities - peripheral pulses normal, no pedal edema, no clubbing or cyanosis Skin - normal coloration and turgor, no rashes other than noted on left scrotum above  ED Course  Procedures (including critical care time) Labs Review Labs Reviewed  CBC - Abnormal; Notable for the following:    Hemoglobin 11.8 (*)    HCT 35.4 (*)    All other components within normal limits  BASIC METABOLIC PANEL - Abnormal; Notable for the following:    Glucose, Bld 125 (*)    BUN 28 (*)    Creatinine, Ser 1.72 (*)    GFR calc non Af Amer 37 (*)    GFR calc Af Amer 43 (*)    All other components within normal limits  URINALYSIS, ROUTINE W REFLEX MICROSCOPIC    Imaging Review US Scrotum  05/19/2014   CLINICAL DATA:  Left testicle swelling  EXAM: SCROTAL ULTRASOUND  DOPPLER ULTRASOUND OF THE TESTICLES  TECHNIQUE: Complete ultrasound examination of the testicles, epididymis, and other scrotal structures was performed. Color and spectral Doppler ultrasound were also utilized to evaluate blood flow to the testicles.  COMPARISON:  None.  FINDINGS: Right testicle  Measurements: 4.4 x 2.1 x 2.5 cm. No mass or microlithiasis visualized.  Left testicle  Measurements: 4.3 x 2.3 x 3.0 cm. No mass or microlithiasis visualized.  Right epididymis:  3 mm epididymal cyst.  No hypervascularity.  Left epididymis:  3 mm epididymal cyst.  No hypervascularity.  Hydrocele:  Right greater than left, small to moderate  Varicocele:  Present bilaterally.  Other: Marked  scrotal wall thickening and increased vascularity, left greater than right.  Pulsed Doppler interrogation of both testes demonstrates normal low resistance arterial and venous waveforms bilaterally.  IMPRESSION: Marked scrotal wall thickening and increased vascularity is nonspecific. This can be seen in the setting of cellulitis or third spacing/ edema.   Electronically Signed   By: Carlos Levering M.D.   On: 05/19/2014 21:20   [place] Art/ven Flow Abd Pelv Doppler  05/19/2014   CLINICAL DATA:  Left testicle swelling  EXAM: SCROTAL ULTRASOUND  DOPPLER ULTRASOUND OF THE TESTICLES  TECHNIQUE: Complete ultrasound examination of the testicles, epididymis, and other scrotal structures was performed. Color and spectral Doppler ultrasound were also utilized to evaluate blood flow to the testicles.  COMPARISON:  None.  FINDINGS: Right testicle  Measurements: 4.4 x 2.1 x 2.5 cm. No mass or microlithiasis visualized.  Left testicle  Measurements: 4.3 x 2.3 x 3.0 cm. No mass or microlithiasis visualized.  Right epididymis:  3 mm epididymal cyst.  No hypervascularity.  Left epididymis:  3 mm epididymal cyst.  No hypervascularity.  Hydrocele:  Right greater than left, small to moderate  Varicocele:  Present bilaterally.  Other: Marked scrotal wall thickening and increased vascularity, left greater than right.  Pulsed Doppler interrogation of both testes demonstrates normal low resistance arterial and venous waveforms bilaterally.  IMPRESSION: Marked scrotal wall thickening and increased vascularity is nonspecific. This can be seen in the setting of cellulitis or third spacing/ edema.   Electronically Signed   By: Carlos Levering M.D.   On: 05/19/2014 21:20     EKG Interpretation None      MDM   Final diagnoses:  Swelling  Cellulitis of scrotum    Pt with area of swelling and pain in left scrotum- ultrasound shows celllulitis- no abscess, no testicular abnormalities.  Pt was given course of clindmaycin several  weeks ago and symptoms resolved.  Will treat with doxycyline today and given information for followup with urology.  No signs of fourniers gangrene or rapidly spreading infection.  Discharged with strict return precautions.  Pt agreeable with plan.    Alfonzo Beers, MD 05/21/14 1040

## 2015-08-15 DIAGNOSIS — D649 Anemia, unspecified: Secondary | ICD-10-CM | POA: Insufficient documentation

## 2015-08-15 HISTORY — DX: Anemia, unspecified: D64.9

## 2015-12-23 DIAGNOSIS — R5383 Other fatigue: Secondary | ICD-10-CM | POA: Insufficient documentation

## 2015-12-23 HISTORY — DX: Other fatigue: R53.83

## 2016-05-02 ENCOUNTER — Emergency Department (HOSPITAL_COMMUNITY): Payer: Medicare Other

## 2016-05-02 ENCOUNTER — Encounter (HOSPITAL_COMMUNITY): Payer: Self-pay | Admitting: *Deleted

## 2016-05-02 ENCOUNTER — Emergency Department (HOSPITAL_COMMUNITY)
Admission: EM | Admit: 2016-05-02 | Discharge: 2016-05-02 | Disposition: A | Discharge status: Home / Self Care | Payer: Medicare Other | Attending: Emergency Medicine | Admitting: Emergency Medicine

## 2016-05-02 DIAGNOSIS — R6889 Other general symptoms and signs: Secondary | ICD-10-CM

## 2016-05-02 DIAGNOSIS — R509 Fever, unspecified: Secondary | ICD-10-CM | POA: Diagnosis present

## 2016-05-02 DIAGNOSIS — R05 Cough: Secondary | ICD-10-CM | POA: Diagnosis not present

## 2016-05-02 DIAGNOSIS — Z794 Long term (current) use of insulin: Secondary | ICD-10-CM | POA: Diagnosis not present

## 2016-05-02 DIAGNOSIS — I10 Essential (primary) hypertension: Secondary | ICD-10-CM | POA: Insufficient documentation

## 2016-05-02 DIAGNOSIS — E119 Type 2 diabetes mellitus without complications: Secondary | ICD-10-CM | POA: Insufficient documentation

## 2016-05-02 DIAGNOSIS — R062 Wheezing: Secondary | ICD-10-CM | POA: Insufficient documentation

## 2016-05-02 DIAGNOSIS — Z7982 Long term (current) use of aspirin: Secondary | ICD-10-CM | POA: Insufficient documentation

## 2016-05-02 DIAGNOSIS — Z87891 Personal history of nicotine dependence: Secondary | ICD-10-CM | POA: Diagnosis not present

## 2016-05-02 LAB — BASIC METABOLIC PANEL
Anion gap: 9 (ref 5–15)
BUN: 24 mg/dL — ABNORMAL HIGH (ref 6–20)
CHLORIDE: 108 mmol/L (ref 101–111)
CO2: 21 mmol/L — ABNORMAL LOW (ref 22–32)
Calcium: 9.3 mg/dL (ref 8.9–10.3)
Creatinine, Ser: 1.73 mg/dL — ABNORMAL HIGH (ref 0.61–1.24)
GFR calc Af Amer: 42 mL/min — ABNORMAL LOW (ref >60)
GFR, EST NON AFRICAN AMERICAN: 37 mL/min — AB (ref >60)
Glucose, Bld: 249 mg/dL — ABNORMAL HIGH (ref 65–99)
POTASSIUM: 4.7 mmol/L (ref 3.5–5.1)
SODIUM: 138 mmol/L (ref 135–145)

## 2016-05-02 LAB — CBC
HEMATOCRIT: 35.2 % — AB (ref 39.0–52.0)
Hemoglobin: 11.3 g/dL — ABNORMAL LOW (ref 13.0–17.0)
MCH: 26.7 pg (ref 26.0–34.0)
MCHC: 32.1 g/dL (ref 30.0–36.0)
MCV: 83 fL (ref 78.0–100.0)
Platelets: 189 10*3/uL (ref 150–400)
RBC: 4.24 MIL/uL (ref 4.22–5.81)
RDW: 14.2 % (ref 11.5–15.5)
WBC: 3.5 10*3/uL — ABNORMAL LOW (ref 4.0–10.5)

## 2016-05-02 LAB — I-STAT TROPONIN, ED: Troponin i, poc: 0 ng/mL (ref 0.00–0.08)

## 2016-05-02 LAB — D-DIMER, QUANTITATIVE (NOT AT ARMC): D DIMER QUANT: 1.01 ug{FEU}/mL — AB (ref 0.00–0.50)

## 2016-05-02 MED ORDER — OSELTAMIVIR PHOSPHATE 75 MG PO CAPS: 75.0000 mg | ORAL_CAPSULE | Freq: Two times a day (BID) | ORAL | 0 refills | Status: AC

## 2016-05-02 MED ORDER — IOPAMIDOL (ISOVUE-370) INJECTION 76%
INTRAVENOUS | Status: AC
  Administered 2016-05-02: 80 mL
  Filled 2016-05-02: qty 100

## 2016-05-02 MED ORDER — SODIUM CHLORIDE 0.9 % IV BOLUS (SEPSIS)
1000.0000 mL | Freq: Once | INTRAVENOUS | Status: AC
  Administered 2016-05-02: 1000 mL via INTRAVENOUS

## 2016-05-02 MED ORDER — BENZONATATE 100 MG PO CAPS: 100.0000 mg | ORAL_CAPSULE | Freq: Three times a day (TID) | ORAL | 0 refills | Status: DC | PRN

## 2016-05-02 NOTE — ED Notes (Signed)
ED Provider at bedside. 

## 2016-05-02 NOTE — ED Provider Notes (Signed)
[facility] DEPT Provider Note   CSN: [id number] Arrival date & time: 05/02/16  1326     History   Chief Complaint Chief Complaint  Patient presents with  . Chest Pain    HPI Carlos Gill is a 76 y.o. male presenting with 24 hours of sudden onset body aches, fever, chills, cough, mild congestion, chest pain with coughing only or slightly pleuritic in nature on deep inhale. He has been feeling fatigued since yesterday. No chest pain/sob on exertion or at rest unless coughing, no syncope or near-syncope. Reports likely ill contacts with children sick. Patient reports having had a flu shot back in the fall. Denies history of DVT/PE, recent surgery, recent prolonged immobilization or travel, lower leg swelling, calf pain, hemoptysis, nausea, vomiting, diarrhea.  HPI  Past Medical History:  Diagnosis Date  . Diabetes mellitus without complication ([place])   . Hyperlipidemia   . Hypertension   . Sleep apnea    cpap    There are no active problems to display for this patient.   Past Surgical History:  Procedure Laterality Date  . ACHILLES TENDON REPAIR Left 2014  . CATARACT EXTRACTION W/ INTRAOCULAR LENS  IMPLANT, BILATERAL  2011       Home Medications    Prior to Admission medications   Medication Sig Start Date End Date Taking? Authorizing Provider  aspirin EC 81 MG tablet Take 81 mg by mouth every morning.   Yes Historical Provider, MD  DM-Doxylamine-Acetaminophen (NYQUIL COLD & FLU PO) Take 30 mLs by mouth daily as needed (cold).   Yes Historical Provider, MD  gemfibrozil (LOPID) 600 MG tablet Take 600 mg by mouth 2 (two) times daily.   Yes Historical Provider, MD  insulin aspart (NOVOLOG) 100 UNIT/ML injection Inject 6-18 Units into the skin 3 (three) times daily with meals.   Yes Historical Provider, MD  insulin glargine (LANTUS) 100 units/mL SOLN Inject 46 Units into the skin 2 (two) times daily.    Yes Historical Provider, MD  losartan (COZAAR) 100 MG tablet Take 100  mg by mouth daily.   Yes Historical Provider, MD  meloxicam (MOBIC) 15 MG tablet Take 15 mg by mouth daily. 05/14/14  Yes Historical Provider, MD  Multiple Vitamin (MULTIVITAMIN WITH MINERALS) TABS Take 1 tablet by mouth daily.   Yes Historical Provider, MD  Multiple Vitamins-Minerals (PRESERVISION AREDS 2) CAPS Take 1 capsule by mouth 2 (two) times daily.   Yes Historical Provider, MD  omeprazole (PRILOSEC) 20 MG capsule Take 20 mg by mouth daily.   Yes Historical Provider, MD  polyvinyl alcohol (LIQUIFILM TEARS) 1.4 % ophthalmic solution Place 1 drop into both eyes daily.   Yes Historical Provider, MD  potassium chloride SA (K-DUR,KLOR-CON) 20 MEQ tablet Take 20 mEq by mouth daily as needed (for cramping).    Yes Historical Provider, MD  tamsulosin (FLOMAX) 0.4 MG CAPS capsule Take 0.4 mg by mouth 2 (two) times daily. Takes 2 tablets daily    Yes Historical Provider, MD  triamterene-hydrochlorothiazide (MAXZIDE-25) 37.5-25 MG per tablet Take 1 tablet by mouth daily.   Yes Historical Provider, MD  trospium (SANCTURA) 20 MG tablet Take 20 mg by mouth 2 (two) times daily.   Yes Historical Provider, MD  benzonatate (TESSALON PERLES) 100 MG capsule Take 1 capsule (100 mg total) by mouth 3 (three) times daily as needed for cough. 05/02/16   Emeline General, PA-C  oseltamivir (TAMIFLU) 75 MG capsule Take 1 capsule (75 mg total) by mouth 2 (two) times  daily. 05/02/16 05/07/16  Emeline General, PA-C    Family History Family History  Problem Relation Age of Onset  . Diabetes Mother   . Prostate cancer Maternal Grandfather   . Colon cancer Neg Hx     Social History Social History  Substance Use Topics  . Smoking status: Former Smoker    Quit date: 02/01/1990  . Smokeless tobacco: Never Used  . Alcohol use No     Allergies   Patient has no known allergies.   Review of Systems Review of Systems  Constitutional: Positive for chills, fatigue and fever. Negative for appetite change and  diaphoresis.  HENT: Positive for congestion. Negative for ear pain and sore throat.   Eyes: Negative for pain and visual disturbance.  Respiratory: Negative for cough, choking, chest tightness, shortness of breath, wheezing and stridor.   Cardiovascular: Positive for chest pain. Negative for palpitations and leg swelling.  Gastrointestinal: Negative for abdominal distention, abdominal pain, diarrhea, nausea and vomiting.  Genitourinary: Negative for dysuria and hematuria.  Musculoskeletal: Positive for myalgias. Negative for arthralgias, back pain, joint swelling, neck pain and neck stiffness.  Skin: Negative for color change, pallor and rash.  Neurological: Negative for dizziness, tremors, seizures, syncope, speech difficulty, weakness and light-headedness.     Physical Exam Updated Vital Signs BP (!) 173/87   Pulse 91   Temp 98.8 F (37.1 C) (Oral)   Resp (!) 22   SpO2 99%   Physical Exam  Constitutional: He appears well-developed and well-nourished. No distress.  Patient is afebrile, nontoxic-appearing, sitting comfortably in bed in no acute distress.  HENT:  Head: Normocephalic and atraumatic.  Eyes: Conjunctivae and EOM are normal. Right eye exhibits no discharge. Left eye exhibits no discharge.  Neck: Normal range of motion. Neck supple. No JVD present.  Cardiovascular: Normal rate, regular rhythm, normal heart sounds and intact distal pulses.   No murmur heard. Pulmonary/Chest: Effort normal. No stridor. No respiratory distress. He has no wheezes. He has no rales. He exhibits no tenderness.  Very faint expiratory wheezing in the right posterior fields  Abdominal: Soft. He exhibits no distension and no mass. There is no tenderness. There is no rebound and no guarding.  Musculoskeletal: Normal range of motion. He exhibits no edema, tenderness or deformity.  Right calf larger than left on exam. No TTP of calf bilaterally. No edema   Neurological: He is alert.  Skin: Skin is  warm and dry. No rash noted. He is not diaphoretic. No erythema. No pallor.  Psychiatric: He has a normal mood and affect.  Nursing note and vitals reviewed.    ED Treatments / Results  Labs (all labs ordered are listed, but only abnormal results are displayed) Labs Reviewed  BASIC METABOLIC PANEL - Abnormal; Notable for the following:       Result Value   CO2 21 (*)    Glucose, Bld 249 (*)    BUN 24 (*)    Creatinine, Ser 1.73 (*)    GFR calc non Af Amer 37 (*)    GFR calc Af Amer 42 (*)    All other components within normal limits  CBC - Abnormal; Notable for the following:    WBC 3.5 (*)    Hemoglobin 11.3 (*)    HCT 35.2 (*)    All other components within normal limits  D-DIMER, QUANTITATIVE (NOT AT [facility]) - Abnormal; Notable for the following:    D-Dimer, Quant 1.01 (*)    All other components within  normal limits  I-STAT TROPOININ, ED   BUN  Date Value Ref Range Status  05/02/2016 24 (H) 6 - 20 mg/dL Final  05/19/2014 28 (H) 6 - 23 mg/dL Final  10/22/2010 31 (H) 6 - 23 mg/dL Final  10/15/2010 23 6 - 23 mg/dL Final   Creatinine, Ser  Date Value Ref Range Status  05/02/2016 1.73 (H) 0.61 - 1.24 mg/dL Final  05/19/2014 1.72 (H) 0.50 - 1.35 mg/dL Final  10/22/2010 1.58 (H) 0.50 - 1.35 mg/dL Final  10/15/2010 1.53 (H) 0.50 - 1.35 mg/dL Final     EKG  EKG Interpretation  Date/Time:  Sunday May 02 2016 13:25:17 EDT Ventricular Rate:  102 PR Interval:  134 QRS Duration: 84 QT Interval:  334 QTC Calculation: 435 R Axis:   36 Text Interpretation:  Sinus tachycardia Possible Anterior infarct , age undetermined Abnormal ECG Since last tracing rate faster Confirmed by KNAPP  MD-J, JON [phone]) on 05/02/2016 4:14:31 PM       Radiology Dg Chest 2 View  Result Date: 05/02/2016 CLINICAL DATA:  Chest pain EXAM: CHEST  2 VIEW COMPARISON:  10/15/2010 FINDINGS: Increased interstitial markings in the bases have progressed since the prior study. This may be atelectasis or  scarring. Doubt pneumonia. Negative for heart failure or effusion. IMPRESSION: Increased markings in lung bases most likely due to scarring or possibly atelectasis. Electronically Signed   By: Franchot Gallo M.D.   On: 05/02/2016 14:24   Ct Angio Chest Pe W And/or Wo Contrast  Result Date: 05/02/2016 CLINICAL DATA:  Mid chest pain with cough x2 days EXAM: CT ANGIOGRAPHY CHEST WITH CONTRAST TECHNIQUE: Multidetector CT imaging of the chest was performed using the standard protocol during bolus administration of intravenous contrast. Multiplanar CT image reconstructions and MIPs were obtained to evaluate the vascular anatomy. CONTRAST:  80 cc Isovue 370 IV COMPARISON:  None. FINDINGS: Cardiovascular: Satisfactory opacification of the pulmonary arteries to the segmental level. Heart is normal in size without pericardial effusion. No evidence of pulmonary embolism. No aortic aneurysm or dissection is noted. Mediastinum/Nodes: Small mediastinal and hilar lymph nodes are noted possibly reactive in etiology, representative areas including the prevascular lymph nodes measuring 7 mm in short axis and right hilar lymph nodes measuring up to 9 mm. Lungs/Pleura: There is bibasilar atelectasis. Minimal atelectasis or scarring in the left lower lobe. No pneumothorax or significant pleural effusion. No dominant mass. Upper Abdomen: No acute abnormality. Musculoskeletal: Straightening of thoracic curvature. No acute nor suspicious osseous abnormalities. Review of the MIP images confirms the above findings. IMPRESSION: 1. No acute pulmonary embolus, aortic aneurysm or dissection. 2. Bibasilar atelectasis.  No acute pulmonary disease. Electronically Signed   By: Ashley Royalty M.D.   On: 05/02/2016 18:51    Procedures Procedures (including critical care time)  Medications Ordered in ED Medications  sodium chloride 0.9 % bolus 1,000 mL (0 mLs Intravenous Stopped 05/02/16 1704)  iopamidol (ISOVUE-370) 76 % injection (80 mLs   Contrast Given 05/02/16 1821)     Initial Impression / Assessment and Plan / ED Course  I have reviewed the triage vital signs and the nursing notes.  Pertinent labs & imaging results that were available during my care of the patient were reviewed by me and considered in my medical decision making (see chart for details).     Patient presents with generalized body aches, fever, chills, cough and pleuritic chest discomfort. Chest pain only when coughing and somewhat with deep inhale. O2 sats 100% on room air  slightly tachycardic on arrival. Patient is nontoxic-appearing and afebrile.  Workup unremarkable, negative troponin, EKG unremarkable, chest x-ray unremarkable and labs unremarkable. Although patient has risk factors such as DM, HTN and HLD, this chest pain does not appear to be cardiac related and based on hx and negative workup, more likely due to upper respiratory infection. Patient presenting with influenza-like illness with ill-contacts. Because of tachycardia and letf lower extremity larger then right, I had suspicion for PE and D-dimer was elevated. Dimer: 1.01 CTa negative for PE  Reassuring workup and exam. Discharge home with Tamiflu, symptomatic relief and close follow-up with PCP. Discussed strict return precautions and advised to return to the emergency department if experiencing any new or worsening symptoms. Instructions were understood and patient agreed with discharge plan. Patient was discussed with Dr. Tomi Bamberger who agrees with assessment and plan.   Final Clinical Impressions(s) / ED Diagnoses   Final diagnoses:  Flu-like symptoms    New Prescriptions Discharge Medication List as of 05/02/2016  7:16 PM    START taking these medications   Details  oseltamivir (TAMIFLU) 75 MG capsule Take 1 capsule (75 mg total) by mouth 2 (two) times daily., Starting Sun 05/02/2016, Until Fri 05/07/2016, Print         Emeline General, PA-C 05/02/16 2007    Dorie Rank,  MD 05/03/16 2229

## 2016-05-02 NOTE — ED Notes (Signed)
Pt returned from CT and connected to the monitor. Nad at this time. Family at bedside

## 2016-05-02 NOTE — ED Triage Notes (Signed)
Pt reports generalized chest pain and weakness that started yesterday. Pt states that pain is intermittent. Pt states hat pain is worse when he coughs. Reports non productive cough.

## 2016-05-02 NOTE — Discharge Instructions (Signed)
Stay well-hydrated, get some rest, take Tamiflu for the next 5 days. Follow-up with your primary care provider as needed if symptoms persist. Return to the emergency department if he experiencing chest pain, chest pressure, shortness of breath, fever, chills or any other new concerning symptoms.

## 2016-05-02 NOTE — ED Notes (Signed)
Patient transported to CT 

## 2017-04-11 DIAGNOSIS — E669 Obesity, unspecified: Secondary | ICD-10-CM

## 2017-04-11 HISTORY — DX: Obesity, unspecified: E66.9

## 2017-04-27 DIAGNOSIS — M7061 Trochanteric bursitis, right hip: Secondary | ICD-10-CM

## 2017-04-27 HISTORY — DX: Trochanteric bursitis, right hip: M70.61

## 2017-12-19 DIAGNOSIS — T466X5A Adverse effect of antihyperlipidemic and antiarteriosclerotic drugs, initial encounter: Secondary | ICD-10-CM

## 2017-12-19 HISTORY — DX: Adverse effect of antihyperlipidemic and antiarteriosclerotic drugs, initial encounter: T46.6X5A

## 2018-04-19 DIAGNOSIS — H353190 Nonexudative age-related macular degeneration, unspecified eye, stage unspecified: Secondary | ICD-10-CM | POA: Insufficient documentation

## 2018-04-19 HISTORY — DX: Nonexudative age-related macular degeneration, unspecified eye, stage unspecified: H35.3190

## 2018-09-13 ENCOUNTER — Encounter: Payer: Self-pay | Admitting: Gastroenterology

## 2018-09-28 ENCOUNTER — Other Ambulatory Visit: Payer: Self-pay

## 2018-09-28 DIAGNOSIS — Z20822 Contact with and (suspected) exposure to covid-19: Secondary | ICD-10-CM

## 2018-09-29 LAB — NOVEL CORONAVIRUS, NAA: SARS-CoV-2, NAA: NOT DETECTED

## 2018-10-04 ENCOUNTER — Telehealth: Payer: Self-pay | Admitting: Internal Medicine

## 2018-10-04 NOTE — Telephone Encounter (Signed)
Negative COVID results given. Patient results "NOT Detected." Caller expressed understanding. ° °

## 2018-10-09 ENCOUNTER — Encounter: Payer: Self-pay | Admitting: Gastroenterology

## 2019-01-09 ENCOUNTER — Other Ambulatory Visit: Payer: Self-pay

## 2019-01-09 DIAGNOSIS — Z20822 Contact with and (suspected) exposure to covid-19: Secondary | ICD-10-CM

## 2019-01-12 LAB — NOVEL CORONAVIRUS, NAA: SARS-CoV-2, NAA: NOT DETECTED

## 2019-10-17 ENCOUNTER — Other Ambulatory Visit: Payer: Self-pay

## 2019-10-17 ENCOUNTER — Other Ambulatory Visit: Payer: Medicare Other

## 2019-10-17 DIAGNOSIS — Z20822 Contact with and (suspected) exposure to covid-19: Secondary | ICD-10-CM

## 2019-10-19 LAB — SARS-COV-2, NAA 2 DAY TAT

## 2019-10-19 LAB — NOVEL CORONAVIRUS, NAA: SARS-CoV-2, NAA: NOT DETECTED

## 2019-12-03 ENCOUNTER — Other Ambulatory Visit: Payer: Self-pay | Admitting: Internal Medicine

## 2019-12-03 DIAGNOSIS — I129 Hypertensive chronic kidney disease with stage 1 through stage 4 chronic kidney disease, or unspecified chronic kidney disease: Secondary | ICD-10-CM

## 2020-01-17 ENCOUNTER — Other Ambulatory Visit: Payer: Medicare Other

## 2020-01-22 ENCOUNTER — Other Ambulatory Visit: Payer: Self-pay

## 2020-01-22 ENCOUNTER — Other Ambulatory Visit: Payer: Medicare Other

## 2020-01-22 DIAGNOSIS — Z20822 Contact with and (suspected) exposure to covid-19: Secondary | ICD-10-CM

## 2020-01-25 LAB — NOVEL CORONAVIRUS, NAA: SARS-CoV-2, NAA: NOT DETECTED

## 2020-10-05 ENCOUNTER — Other Ambulatory Visit: Payer: Self-pay

## 2020-10-05 ENCOUNTER — Emergency Department (HOSPITAL_COMMUNITY)
Admission: EM | Admit: 2020-10-05 | Discharge: 2020-10-05 | Disposition: A | Discharge status: Home / Self Care | Payer: Medicare Other | Attending: Emergency Medicine | Admitting: Emergency Medicine

## 2020-10-05 ENCOUNTER — Encounter (HOSPITAL_COMMUNITY): Payer: Self-pay

## 2020-10-05 DIAGNOSIS — E119 Type 2 diabetes mellitus without complications: Secondary | ICD-10-CM | POA: Diagnosis not present

## 2020-10-05 DIAGNOSIS — Z87891 Personal history of nicotine dependence: Secondary | ICD-10-CM | POA: Insufficient documentation

## 2020-10-05 DIAGNOSIS — Z794 Long term (current) use of insulin: Secondary | ICD-10-CM | POA: Insufficient documentation

## 2020-10-05 DIAGNOSIS — B349 Viral infection, unspecified: Secondary | ICD-10-CM | POA: Diagnosis not present

## 2020-10-05 DIAGNOSIS — Z86010 Personal history of colonic polyps: Secondary | ICD-10-CM | POA: Insufficient documentation

## 2020-10-05 DIAGNOSIS — Z7982 Long term (current) use of aspirin: Secondary | ICD-10-CM | POA: Insufficient documentation

## 2020-10-05 DIAGNOSIS — U071 COVID-19: Secondary | ICD-10-CM | POA: Diagnosis not present

## 2020-10-05 DIAGNOSIS — I10 Essential (primary) hypertension: Secondary | ICD-10-CM | POA: Insufficient documentation

## 2020-10-05 DIAGNOSIS — R509 Fever, unspecified: Secondary | ICD-10-CM | POA: Diagnosis present

## 2020-10-05 DIAGNOSIS — Z79899 Other long term (current) drug therapy: Secondary | ICD-10-CM | POA: Insufficient documentation

## 2020-10-05 LAB — RESP PANEL BY RT-PCR (FLU A&B, COVID) ARPGX2
Influenza A by PCR: NEGATIVE
Influenza B by PCR: NEGATIVE
SARS Coronavirus 2 by RT PCR: POSITIVE — AB

## 2020-10-05 MED ORDER — MOLNUPIRAVIR EUA 200MG CAPSULE: 4.0000 | ORAL_CAPSULE | Freq: Two times a day (BID) | ORAL | 0 refills | Status: AC

## 2020-10-05 NOTE — ED Triage Notes (Signed)
Pt presents to ED with complaints of chills, headache, fever up to 100.3 started last night. Wants to be checked for covid.

## 2020-10-05 NOTE — Discharge Instructions (Addendum)
You have had a cough fevers and some chills.  Your COVID test is pending.  If the COVID test comes back positive fill the medication.  If it is negative it is likely some other virus causing the symptoms.  Watch for worsening symptoms and return if needed

## 2020-10-05 NOTE — ED Provider Notes (Signed)
[facility] EMERGENCY DEPARTMENT Provider Note   CSN: [id number] Arrival date & time: 10/05/20  [id number]     History Chief Complaint  Patient presents with   Generalized Body Aches    Carlos Gill is a 80 y.o. male.  HPI Patient presents worried that he has COVID.  States starting yesterday had fevers headache and chills.  Temperature up to 100.3.  No known sick contacts.  Occasional cough without sputum production.  No dysuria.  No nausea vomiting diarrhea.  Is vaccinated for COVID.    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   Past Surgical History:  Procedure Laterality Date   ACHILLES TENDON REPAIR Left 2014   CATARACT EXTRACTION W/ INTRAOCULAR LENS  IMPLANT, BILATERAL  2011       Family History  Problem Relation Age of Onset   Diabetes Mother    Prostate cancer Maternal Grandfather    Colon cancer Neg Hx     Social History   Tobacco Use   Smoking status: Former    Types: Cigarettes    Quit date: 02/01/1990    Years since quitting: 30.6   Smokeless tobacco: Never  Substance Use Topics   Alcohol use: No   Drug use: No    Home Medications Prior to Admission medications   Medication Sig Start Date End Date Taking? Authorizing Provider  molnupiravir EUA 200 mg CAPS Take 4 capsules (800 mg total) by mouth 2 (two) times daily for 5 days. 10/05/20 10/10/20 Yes Davonna Belling, MD  aspirin EC 81 MG tablet Take 81 mg by mouth every morning.    [provider]  benzonatate (TESSALON PERLES) 100 MG capsule Take 1 capsule (100 mg total) by mouth 3 (three) times daily as needed for cough. 05/02/16   Emeline General, PA-C  DM-Doxylamine-Acetaminophen (NYQUIL COLD & FLU PO) Take 30 mLs by mouth daily as needed (cold).    [provider]  gemfibrozil (LOPID) 600 MG  tablet Take 600 mg by mouth 2 (two) times daily.    [provider]  insulin aspart (NOVOLOG) 100 UNIT/ML injection Inject 6-18 Units into the skin 3 (three) times daily with meals.    [provider]  insulin glargine (LANTUS) 100 units/mL SOLN Inject 46 Units into the skin 2 (two) times daily.     [provider]  losartan (COZAAR) 100 MG tablet Take 100 mg by mouth daily.    [provider]  meloxicam (MOBIC) 15 MG tablet Take 15 mg by mouth daily. 05/14/14   [provider]  Multiple Vitamin (MULTIVITAMIN WITH MINERALS) TABS Take 1 tablet by mouth daily.    [provider]  Multiple Vitamins-Minerals (PRESERVISION AREDS 2) CAPS Take 1 capsule by mouth 2 (two) times daily.    [provider]  omeprazole (PRILOSEC) 20 MG capsule Take 20 mg by mouth daily.    [provider]  polyvinyl alcohol (LIQUIFILM TEARS) 1.4 % ophthalmic solution Place 1 drop into both eyes daily.    [provider]  potassium chloride SA (K-DUR,KLOR-CON) 20 MEQ tablet Take 20 mEq by mouth daily as needed (for cramping).     [provider]  tamsulosin (FLOMAX) 0.4 MG CAPS capsule Take 0.4 mg by mouth 2 (two) times  daily. Takes 2 tablets daily     [provider]  triamterene-hydrochlorothiazide (MAXZIDE-25) 37.5-25 MG per tablet Take 1 tablet by mouth daily.    [provider]  trospium (SANCTURA) 20 MG tablet Take 20 mg by mouth 2 (two) times daily.    [provider]    Allergies    Patient has no known allergies.  Review of Systems   Review of Systems  Constitutional:  Positive for chills and fever. Negative for appetite change.  HENT:  Negative for congestion.   Respiratory:  Positive for cough.   Cardiovascular:  Negative for chest pain.  Gastrointestinal:  Negative for abdominal pain.  Genitourinary:  Negative for dysuria.  Musculoskeletal:  Positive for myalgias.  Skin:  Negative for rash.   Neurological:  Negative for weakness.  Psychiatric/Behavioral:  Negative for confusion.    Physical Exam Updated Vital Signs BP (!) 147/64 (BP Location: Right Arm)   Pulse 86   Temp 99.2 F (37.3 C) (Oral)   Resp 18   Ht 6' (1.829 m)   Wt 101.7 kg   SpO2 98%   BMI 30.41 kg/m   Physical Exam Vitals and nursing note reviewed.  HENT:     Head: Atraumatic.     Mouth/Throat:     Mouth: Mucous membranes are moist.  Eyes:     Pupils: Pupils are equal, round, and reactive to light.  Cardiovascular:     Rate and Rhythm: Regular rhythm.  Pulmonary:     Breath sounds: No wheezing or rhonchi.  Abdominal:     Tenderness: There is no abdominal tenderness.  Musculoskeletal:        General: No tenderness.     Cervical back: Neck supple.  Skin:    General: Skin is warm.     Capillary Refill: Capillary refill takes less than 2 seconds.  Neurological:     Mental Status: He is alert and oriented to person, place, and time.    ED Results / Procedures / Treatments   Labs (all labs ordered are listed, but only abnormal results are displayed) Labs Reviewed  RESP PANEL BY RT-PCR (FLU A&B, COVID) [id number]    EKG None  Radiology No results found.  Procedures Procedures   Medications Ordered in ED Medications - No data to display  ED Course  I have reviewed the triage vital signs and the nursing notes.  Pertinent labs & imaging results that were available during my care of the patient were reviewed by me and considered in my medical decision making (see chart for details).    MDM Rules/Calculators/A&P                           Patient with URI symptoms.  Chills.  COVID test done but not resulted during ER visit.  Lungs clear.  No dysuria.  No other clear source of infection.  Is vaccinated.  Discussed with patient about antiviral treatment.  Will treat with antivirals if patient is positive.  Prescription given.  We will follow-up as an outpatient.  Patient has renal issues  and molnupiravir prescription given doubt pneumonia.  Doubt urinary tract infection Final Clinical Impression(s) / ED Diagnoses Final diagnoses:  Viral illness    Rx / DC Orders ED Discharge Orders          Ordered    molnupiravir EUA 200 mg CAPS  2 times daily        10/05/20 0851  Davonna Belling, MD 10/05/20 [phone]

## 2020-10-05 NOTE — ED Notes (Signed)
Pt called and informed covid positive.

## 2021-01-21 DIAGNOSIS — R809 Proteinuria, unspecified: Secondary | ICD-10-CM | POA: Insufficient documentation

## 2021-01-21 HISTORY — DX: Proteinuria, unspecified: R80.9

## 2021-03-09 DIAGNOSIS — J310 Chronic rhinitis: Secondary | ICD-10-CM

## 2021-03-09 DIAGNOSIS — J324 Chronic pansinusitis: Secondary | ICD-10-CM

## 2021-03-09 HISTORY — DX: Chronic rhinitis: J31.0

## 2021-03-09 HISTORY — DX: Chronic pansinusitis: J32.4

## 2021-04-21 DIAGNOSIS — I5033 Acute on chronic diastolic (congestive) heart failure: Secondary | ICD-10-CM | POA: Insufficient documentation

## 2021-04-21 DIAGNOSIS — N401 Enlarged prostate with lower urinary tract symptoms: Secondary | ICD-10-CM

## 2021-04-21 DIAGNOSIS — H3530 Unspecified macular degeneration: Secondary | ICD-10-CM

## 2021-04-21 DIAGNOSIS — N179 Acute kidney failure, unspecified: Secondary | ICD-10-CM

## 2021-04-21 DIAGNOSIS — N400 Enlarged prostate without lower urinary tract symptoms: Secondary | ICD-10-CM

## 2021-04-21 DIAGNOSIS — R0789 Other chest pain: Secondary | ICD-10-CM

## 2021-04-21 DIAGNOSIS — R779 Abnormality of plasma protein, unspecified: Secondary | ICD-10-CM

## 2021-04-21 DIAGNOSIS — Z961 Presence of intraocular lens: Secondary | ICD-10-CM | POA: Insufficient documentation

## 2021-04-21 DIAGNOSIS — R0602 Shortness of breath: Secondary | ICD-10-CM

## 2021-04-21 DIAGNOSIS — N138 Other obstructive and reflux uropathy: Secondary | ICD-10-CM

## 2021-04-21 DIAGNOSIS — R9439 Abnormal result of other cardiovascular function study: Secondary | ICD-10-CM

## 2021-04-21 HISTORY — DX: Acute kidney failure, unspecified: N17.9

## 2021-04-21 HISTORY — DX: Acute on chronic diastolic (congestive) heart failure: I50.33

## 2021-04-21 HISTORY — DX: Benign prostatic hyperplasia without lower urinary tract symptoms: N40.0

## 2021-04-21 HISTORY — DX: Other chest pain: R07.89

## 2021-04-21 HISTORY — DX: Presence of intraocular lens: Z96.1

## 2021-04-21 HISTORY — DX: Shortness of breath: R06.02

## 2021-04-21 HISTORY — DX: Other obstructive and reflux uropathy: N40.1

## 2021-04-21 HISTORY — DX: Other obstructive and reflux uropathy: N13.8

## 2021-04-21 HISTORY — DX: Unspecified macular degeneration: H35.30

## 2021-04-21 HISTORY — DX: Abnormality of plasma protein, unspecified: R77.9

## 2021-04-21 HISTORY — DX: Abnormal result of other cardiovascular function study: R94.39

## 2021-11-01 ENCOUNTER — Inpatient Hospital Stay (HOSPITAL_COMMUNITY)
Admission: EM | Admit: 2021-11-01 | Discharge: 2021-11-04 | DRG: 872 | Disposition: A | Discharge status: Home / Self Care | Payer: Medicare Other | Attending: Internal Medicine | Admitting: Internal Medicine

## 2021-11-01 ENCOUNTER — Encounter (HOSPITAL_COMMUNITY): Payer: Self-pay | Admitting: Emergency Medicine

## 2021-11-01 ENCOUNTER — Emergency Department (HOSPITAL_COMMUNITY): Payer: Medicare Other

## 2021-11-01 ENCOUNTER — Other Ambulatory Visit: Payer: Self-pay

## 2021-11-01 DIAGNOSIS — E785 Hyperlipidemia, unspecified: Secondary | ICD-10-CM

## 2021-11-01 DIAGNOSIS — I129 Hypertensive chronic kidney disease with stage 1 through stage 4 chronic kidney disease, or unspecified chronic kidney disease: Secondary | ICD-10-CM | POA: Diagnosis present

## 2021-11-01 DIAGNOSIS — N12 Tubulo-interstitial nephritis, not specified as acute or chronic: Secondary | ICD-10-CM | POA: Diagnosis not present

## 2021-11-01 DIAGNOSIS — Z87891 Personal history of nicotine dependence: Secondary | ICD-10-CM

## 2021-11-01 DIAGNOSIS — N179 Acute kidney failure, unspecified: Secondary | ICD-10-CM

## 2021-11-01 DIAGNOSIS — Z7985 Long-term (current) use of injectable non-insulin antidiabetic drugs: Secondary | ICD-10-CM

## 2021-11-01 DIAGNOSIS — D631 Anemia in chronic kidney disease: Secondary | ICD-10-CM | POA: Diagnosis present

## 2021-11-01 DIAGNOSIS — E1122 Type 2 diabetes mellitus with diabetic chronic kidney disease: Secondary | ICD-10-CM | POA: Diagnosis present

## 2021-11-01 DIAGNOSIS — Z7902 Long term (current) use of antithrombotics/antiplatelets: Secondary | ICD-10-CM

## 2021-11-01 DIAGNOSIS — E872 Acidosis, unspecified: Secondary | ICD-10-CM | POA: Diagnosis present

## 2021-11-01 DIAGNOSIS — N1832 Chronic kidney disease, stage 3b: Secondary | ICD-10-CM | POA: Diagnosis present

## 2021-11-01 DIAGNOSIS — Z833 Family history of diabetes mellitus: Secondary | ICD-10-CM

## 2021-11-01 DIAGNOSIS — E11649 Type 2 diabetes mellitus with hypoglycemia without coma: Secondary | ICD-10-CM | POA: Diagnosis not present

## 2021-11-01 DIAGNOSIS — N281 Cyst of kidney, acquired: Secondary | ICD-10-CM | POA: Diagnosis present

## 2021-11-01 DIAGNOSIS — Z888 Allergy status to other drugs, medicaments and biological substances status: Secondary | ICD-10-CM

## 2021-11-01 DIAGNOSIS — K219 Gastro-esophageal reflux disease without esophagitis: Secondary | ICD-10-CM

## 2021-11-01 DIAGNOSIS — Z794 Long term (current) use of insulin: Secondary | ICD-10-CM

## 2021-11-01 DIAGNOSIS — N10 Acute tubulo-interstitial nephritis: Secondary | ICD-10-CM

## 2021-11-01 DIAGNOSIS — G4733 Obstructive sleep apnea (adult) (pediatric): Secondary | ICD-10-CM | POA: Diagnosis present

## 2021-11-01 DIAGNOSIS — N138 Other obstructive and reflux uropathy: Secondary | ICD-10-CM

## 2021-11-01 DIAGNOSIS — N2889 Other specified disorders of kidney and ureter: Secondary | ICD-10-CM

## 2021-11-01 DIAGNOSIS — A4150 Gram-negative sepsis, unspecified: Secondary | ICD-10-CM | POA: Diagnosis not present

## 2021-11-01 DIAGNOSIS — N289 Disorder of kidney and ureter, unspecified: Secondary | ICD-10-CM

## 2021-11-01 DIAGNOSIS — E876 Hypokalemia: Secondary | ICD-10-CM | POA: Diagnosis not present

## 2021-11-01 DIAGNOSIS — R6520 Severe sepsis without septic shock: Secondary | ICD-10-CM | POA: Diagnosis present

## 2021-11-01 DIAGNOSIS — N400 Enlarged prostate without lower urinary tract symptoms: Secondary | ICD-10-CM | POA: Diagnosis present

## 2021-11-01 DIAGNOSIS — Z961 Presence of intraocular lens: Secondary | ICD-10-CM | POA: Diagnosis present

## 2021-11-01 DIAGNOSIS — I10 Essential (primary) hypertension: Secondary | ICD-10-CM

## 2021-11-01 DIAGNOSIS — N401 Enlarged prostate with lower urinary tract symptoms: Secondary | ICD-10-CM

## 2021-11-01 DIAGNOSIS — Z79899 Other long term (current) drug therapy: Secondary | ICD-10-CM

## 2021-11-01 DIAGNOSIS — Z7982 Long term (current) use of aspirin: Secondary | ICD-10-CM

## 2021-11-01 LAB — COMPREHENSIVE METABOLIC PANEL
ALT: 11 U/L (ref 0–44)
AST: 16 U/L (ref 15–41)
Albumin: 3.1 g/dL — ABNORMAL LOW (ref 3.5–5.0)
Alkaline Phosphatase: 87 U/L (ref 38–126)
Anion gap: 10 (ref 5–15)
BUN: 33 mg/dL — ABNORMAL HIGH (ref 8–23)
CO2: 18 mmol/L — ABNORMAL LOW (ref 22–32)
Calcium: 8.9 mg/dL (ref 8.9–10.3)
Chloride: 110 mmol/L (ref 98–111)
Creatinine, Ser: 2.98 mg/dL — ABNORMAL HIGH (ref 0.61–1.24)
GFR, Estimated: 20 mL/min — ABNORMAL LOW (ref >60)
Glucose, Bld: 122 mg/dL — ABNORMAL HIGH (ref 70–99)
Potassium: 4.3 mmol/L (ref 3.5–5.1)
Sodium: 138 mmol/L (ref 135–145)
Total Bilirubin: 0.5 mg/dL (ref 0.3–1.2)
Total Protein: 6.8 g/dL (ref 6.5–8.1)

## 2021-11-01 LAB — URINALYSIS, ROUTINE W REFLEX MICROSCOPIC
Bilirubin Urine: NEGATIVE
Glucose, UA: NEGATIVE mg/dL
Ketones, ur: NEGATIVE mg/dL
Nitrite: NEGATIVE
Protein, ur: >=300 mg/dL — AB
Specific Gravity, Urine: 1.013 (ref 1.005–1.030)
WBC, UA: >50 WBC/hpf — ABNORMAL HIGH (ref 0–5)
pH: 5 (ref 5.0–8.0)

## 2021-11-01 LAB — CBC WITH DIFFERENTIAL/PLATELET
Abs Immature Granulocytes: 0.04 10*3/uL (ref 0.00–0.07)
Basophils Absolute: 0 10*3/uL (ref 0.0–0.1)
Basophils Relative: 0 %
Eosinophils Absolute: 0 10*3/uL (ref 0.0–0.5)
Eosinophils Relative: 0 %
HCT: 31.7 % — ABNORMAL LOW (ref 39.0–52.0)
Hemoglobin: 10.3 g/dL — ABNORMAL LOW (ref 13.0–17.0)
Immature Granulocytes: 0 %
Lymphocytes Relative: 11 %
Lymphs Abs: 1.4 10*3/uL (ref 0.7–4.0)
MCH: 28.1 pg (ref 26.0–34.0)
MCHC: 32.5 g/dL (ref 30.0–36.0)
MCV: 86.4 fL (ref 80.0–100.0)
Monocytes Absolute: 1.3 10*3/uL — ABNORMAL HIGH (ref 0.1–1.0)
Monocytes Relative: 10 %
Neutro Abs: 9.3 10*3/uL — ABNORMAL HIGH (ref 1.7–7.7)
Neutrophils Relative %: 79 %
Platelets: 218 10*3/uL (ref 150–400)
RBC: 3.67 MIL/uL — ABNORMAL LOW (ref 4.22–5.81)
RDW: 13.8 % (ref 11.5–15.5)
WBC: 12.1 10*3/uL — ABNORMAL HIGH (ref 4.0–10.5)
nRBC: 0 % (ref 0.0–0.2)

## 2021-11-01 LAB — LIPASE, BLOOD: Lipase: 25 U/L (ref 11–51)

## 2021-11-01 NOTE — ED Triage Notes (Signed)
Patient reports right lower flank pain radiating down to right lower abdomen and groin , denies injury , no hematuria or dysuria .

## 2021-11-01 NOTE — ED Provider Triage Note (Signed)
Emergency Medicine Provider Triage Evaluation Note  Carlos Gill , a 81 y.o. male  was evaluated in triage.  Pt complains of 2 days of right-sided flank pain and right-sided groin pain.  Patient denies urinary symptoms at this time.  Does endorse mild nausea.  Patient with history of stage IV kidney disease.  Patient denies shortness of breath, chest pain.  Review of Systems  Positive: As above Negative: As above  Physical Exam  BP (!) 143/69 (BP Location: Right Arm)   Pulse 99   Temp 100.3 F (37.9 C) (Oral)   Resp 17   SpO2 97%  Gen:   Awake, no distress   Resp:  Normal effort  MSK:   Moves extremities without difficulty  Other:    Medical Decision Making  Medically screening exam initiated at 7:24 PM.  Appropriate orders placed.  THEUS ESPIN was informed that the remainder of the evaluation will be completed by another provider, this initial triage assessment does not replace that evaluation, and the importance of remaining in the ED until their evaluation is complete.     Dorothyann Peng, PA-C 11/01/21 1924

## 2021-11-02 ENCOUNTER — Encounter (HOSPITAL_COMMUNITY): Payer: Self-pay | Admitting: Emergency Medicine

## 2021-11-02 DIAGNOSIS — I10 Essential (primary) hypertension: Secondary | ICD-10-CM

## 2021-11-02 DIAGNOSIS — D631 Anemia in chronic kidney disease: Secondary | ICD-10-CM | POA: Diagnosis present

## 2021-11-02 DIAGNOSIS — Z961 Presence of intraocular lens: Secondary | ICD-10-CM | POA: Diagnosis present

## 2021-11-02 DIAGNOSIS — N10 Acute tubulo-interstitial nephritis: Secondary | ICD-10-CM | POA: Diagnosis present

## 2021-11-02 DIAGNOSIS — K219 Gastro-esophageal reflux disease without esophagitis: Secondary | ICD-10-CM | POA: Diagnosis present

## 2021-11-02 DIAGNOSIS — E785 Hyperlipidemia, unspecified: Secondary | ICD-10-CM | POA: Diagnosis present

## 2021-11-02 DIAGNOSIS — Z7982 Long term (current) use of aspirin: Secondary | ICD-10-CM | POA: Diagnosis not present

## 2021-11-02 DIAGNOSIS — N400 Enlarged prostate without lower urinary tract symptoms: Secondary | ICD-10-CM

## 2021-11-02 DIAGNOSIS — N179 Acute kidney failure, unspecified: Secondary | ICD-10-CM

## 2021-11-02 DIAGNOSIS — E1122 Type 2 diabetes mellitus with diabetic chronic kidney disease: Secondary | ICD-10-CM | POA: Diagnosis present

## 2021-11-02 DIAGNOSIS — R6520 Severe sepsis without septic shock: Secondary | ICD-10-CM | POA: Diagnosis present

## 2021-11-02 DIAGNOSIS — N2889 Other specified disorders of kidney and ureter: Secondary | ICD-10-CM | POA: Diagnosis present

## 2021-11-02 DIAGNOSIS — N390 Urinary tract infection, site not specified: Secondary | ICD-10-CM | POA: Diagnosis present

## 2021-11-02 DIAGNOSIS — N1832 Chronic kidney disease, stage 3b: Secondary | ICD-10-CM | POA: Diagnosis present

## 2021-11-02 DIAGNOSIS — Z833 Family history of diabetes mellitus: Secondary | ICD-10-CM | POA: Diagnosis not present

## 2021-11-02 DIAGNOSIS — N12 Tubulo-interstitial nephritis, not specified as acute or chronic: Secondary | ICD-10-CM | POA: Diagnosis present

## 2021-11-02 DIAGNOSIS — A4150 Gram-negative sepsis, unspecified: Secondary | ICD-10-CM | POA: Diagnosis present

## 2021-11-02 DIAGNOSIS — I129 Hypertensive chronic kidney disease with stage 1 through stage 4 chronic kidney disease, or unspecified chronic kidney disease: Secondary | ICD-10-CM | POA: Diagnosis present

## 2021-11-02 DIAGNOSIS — Z79899 Other long term (current) drug therapy: Secondary | ICD-10-CM | POA: Diagnosis not present

## 2021-11-02 DIAGNOSIS — Z794 Long term (current) use of insulin: Secondary | ICD-10-CM | POA: Diagnosis not present

## 2021-11-02 DIAGNOSIS — E876 Hypokalemia: Secondary | ICD-10-CM | POA: Diagnosis not present

## 2021-11-02 DIAGNOSIS — Z87891 Personal history of nicotine dependence: Secondary | ICD-10-CM | POA: Diagnosis not present

## 2021-11-02 DIAGNOSIS — E872 Acidosis, unspecified: Secondary | ICD-10-CM | POA: Diagnosis present

## 2021-11-02 DIAGNOSIS — E11649 Type 2 diabetes mellitus with hypoglycemia without coma: Secondary | ICD-10-CM | POA: Diagnosis not present

## 2021-11-02 DIAGNOSIS — N289 Disorder of kidney and ureter, unspecified: Secondary | ICD-10-CM | POA: Diagnosis present

## 2021-11-02 DIAGNOSIS — G4733 Obstructive sleep apnea (adult) (pediatric): Secondary | ICD-10-CM | POA: Diagnosis present

## 2021-11-02 DIAGNOSIS — N281 Cyst of kidney, acquired: Secondary | ICD-10-CM | POA: Diagnosis present

## 2021-11-02 HISTORY — DX: Essential (primary) hypertension: I10

## 2021-11-02 HISTORY — DX: Gastro-esophageal reflux disease without esophagitis: K21.9

## 2021-11-02 LAB — BASIC METABOLIC PANEL
Anion gap: 9 (ref 5–15)
BUN: 36 mg/dL — ABNORMAL HIGH (ref 8–23)
CO2: 19 mmol/L — ABNORMAL LOW (ref 22–32)
Calcium: 8.9 mg/dL (ref 8.9–10.3)
Chloride: 112 mmol/L — ABNORMAL HIGH (ref 98–111)
Creatinine, Ser: 2.92 mg/dL — ABNORMAL HIGH (ref 0.61–1.24)
GFR, Estimated: 21 mL/min — ABNORMAL LOW (ref >60)
Glucose, Bld: 152 mg/dL — ABNORMAL HIGH (ref 70–99)
Potassium: 4.2 mmol/L (ref 3.5–5.1)
Sodium: 140 mmol/L (ref 135–145)

## 2021-11-02 LAB — CBC
HCT: 28.8 % — ABNORMAL LOW (ref 39.0–52.0)
Hemoglobin: 9.4 g/dL — ABNORMAL LOW (ref 13.0–17.0)
MCH: 27.7 pg (ref 26.0–34.0)
MCHC: 32.6 g/dL (ref 30.0–36.0)
MCV: 85 fL (ref 80.0–100.0)
Platelets: 211 10*3/uL (ref 150–400)
RBC: 3.39 MIL/uL — ABNORMAL LOW (ref 4.22–5.81)
RDW: 13.8 % (ref 11.5–15.5)
WBC: 12.4 10*3/uL — ABNORMAL HIGH (ref 4.0–10.5)
nRBC: 0 % (ref 0.0–0.2)

## 2021-11-02 LAB — PROTIME-INR
INR: 1.4 — ABNORMAL HIGH (ref 0.8–1.2)
Prothrombin Time: 16.9 seconds — ABNORMAL HIGH (ref 11.4–15.2)

## 2021-11-02 LAB — CBG MONITORING, ED
Glucose-Capillary: 161 mg/dL — ABNORMAL HIGH (ref 70–99)
Glucose-Capillary: 111 mg/dL — ABNORMAL HIGH (ref 70–99)
Glucose-Capillary: 158 mg/dL — ABNORMAL HIGH (ref 70–99)

## 2021-11-02 LAB — PROCALCITONIN: Procalcitonin: 0.61 ng/mL

## 2021-11-02 LAB — CORTISOL-AM, BLOOD: Cortisol - AM: 17.6 ug/dL (ref 6.7–22.6)

## 2021-11-02 LAB — GLUCOSE, CAPILLARY: Glucose-Capillary: 139 mg/dL — ABNORMAL HIGH (ref 70–99)

## 2021-11-02 MED ORDER — ENOXAPARIN SODIUM 30 MG/0.3ML IJ SOSY
30.0000 mg | PREFILLED_SYRINGE | INTRAMUSCULAR | Status: DC
  Administered 2021-11-02 – 2021-11-03 (×2): 30 mg via SUBCUTANEOUS
  Filled 2021-11-02 (×2): qty 0.3

## 2021-11-02 MED ORDER — TRAZODONE HCL 50 MG PO TABS: 25.0000 mg | ORAL_TABLET | Freq: Every evening | ORAL | Status: DC | PRN

## 2021-11-02 MED ORDER — SODIUM CHLORIDE 0.9 % IV SOLN
2.0000 g | INTRAVENOUS | Status: DC
  Administered 2021-11-02 – 2021-11-04 (×3): 2 g via INTRAVENOUS
  Filled 2021-11-02 (×3): qty 20

## 2021-11-02 MED ORDER — ACETAMINOPHEN 325 MG PO TABS
650.0000 mg | ORAL_TABLET | Freq: Four times a day (QID) | ORAL | Status: DC | PRN
  Administered 2021-11-02 (×2): 650 mg via ORAL
  Filled 2021-11-02 (×2): qty 2

## 2021-11-02 MED ORDER — INSULIN GLARGINE 100 UNITS/ML SOLOSTAR PEN
46.0000 [IU] | PEN_INJECTOR | Freq: Two times a day (BID) | SUBCUTANEOUS | Status: DC
  Filled 2021-11-02: qty 3

## 2021-11-02 MED ORDER — POLYVINYL ALCOHOL 1.4 % OP SOLN
1.0000 [drp] | Freq: Every day | OPHTHALMIC | Status: DC
  Administered 2021-11-03 – 2021-11-04 (×2): 1 [drp] via OPHTHALMIC
  Filled 2021-11-02: qty 15

## 2021-11-02 MED ORDER — ONDANSETRON HCL 4 MG PO TABS: 4.0000 mg | ORAL_TABLET | Freq: Four times a day (QID) | ORAL | Status: DC | PRN

## 2021-11-02 MED ORDER — PROSIGHT PO TABS
1.0000 | ORAL_TABLET | Freq: Two times a day (BID) | ORAL | Status: DC
  Administered 2021-11-02 – 2021-11-04 (×4): 1 via ORAL
  Filled 2021-11-02 (×4): qty 1

## 2021-11-02 MED ORDER — BENZONATATE 100 MG PO CAPS: 100.0000 mg | ORAL_CAPSULE | Freq: Three times a day (TID) | ORAL | Status: DC | PRN

## 2021-11-02 MED ORDER — POTASSIUM CHLORIDE CRYS ER 20 MEQ PO TBCR: 20.0000 meq | EXTENDED_RELEASE_TABLET | Freq: Every day | ORAL | Status: DC | PRN

## 2021-11-02 MED ORDER — MAGNESIUM HYDROXIDE 400 MG/5ML PO SUSP: 30.0000 mL | Freq: Every day | ORAL | Status: DC | PRN

## 2021-11-02 MED ORDER — GEMFIBROZIL 600 MG PO TABS
600.0000 mg | ORAL_TABLET | Freq: Two times a day (BID) | ORAL | Status: DC
  Administered 2021-11-02 – 2021-11-04 (×4): 600 mg via ORAL
  Filled 2021-11-02 (×4): qty 1

## 2021-11-02 MED ORDER — LABETALOL HCL 5 MG/ML IV SOLN: 10.0000 mg | INTRAVENOUS | Status: DC | PRN

## 2021-11-02 MED ORDER — FESOTERODINE FUMARATE ER 4 MG PO TB24: 4.0000 mg | ORAL_TABLET | Freq: Every day | ORAL | Status: DC

## 2021-11-02 MED ORDER — PANTOPRAZOLE SODIUM 40 MG PO TBEC
40.0000 mg | DELAYED_RELEASE_TABLET | Freq: Every day | ORAL | Status: DC
  Administered 2021-11-02 – 2021-11-04 (×3): 40 mg via ORAL
  Filled 2021-11-02 (×3): qty 1

## 2021-11-02 MED ORDER — ACETAMINOPHEN 650 MG RE SUPP: 650.0000 mg | Freq: Four times a day (QID) | RECTAL | Status: DC | PRN

## 2021-11-02 MED ORDER — ONDANSETRON HCL 4 MG/2ML IJ SOLN: 4.0000 mg | Freq: Four times a day (QID) | INTRAMUSCULAR | Status: DC | PRN

## 2021-11-02 MED ORDER — METOPROLOL TARTRATE 25 MG PO TABS
50.0000 mg | ORAL_TABLET | Freq: Two times a day (BID) | ORAL | Status: DC
  Administered 2021-11-02: 50 mg via ORAL
  Filled 2021-11-02: qty 2

## 2021-11-02 MED ORDER — ADULT MULTIVITAMIN W/MINERALS CH
1.0000 | ORAL_TABLET | Freq: Every day | ORAL | Status: DC
  Administered 2021-11-02 – 2021-11-04 (×3): 1 via ORAL
  Filled 2021-11-02 (×3): qty 1

## 2021-11-02 MED ORDER — SODIUM CHLORIDE 0.9 % IV SOLN: INTRAVENOUS | Status: DC

## 2021-11-02 MED ORDER — INSULIN GLARGINE-YFGN 100 UNIT/ML ~~LOC~~ SOLN
46.0000 [IU] | Freq: Two times a day (BID) | SUBCUTANEOUS | Status: DC
  Administered 2021-11-02 – 2021-11-04 (×4): 46 [IU] via SUBCUTANEOUS
  Filled 2021-11-02 (×7): qty 0.46

## 2021-11-02 MED ORDER — SODIUM CHLORIDE 0.9 % IV SOLN: 1.0000 g | Freq: Once | INTRAVENOUS | Status: DC

## 2021-11-02 MED ORDER — AMLODIPINE BESYLATE 10 MG PO TABS
10.0000 mg | ORAL_TABLET | Freq: Every day | ORAL | Status: DC
  Administered 2021-11-02 – 2021-11-04 (×3): 10 mg via ORAL
  Filled 2021-11-02: qty 2
  Filled 2021-11-02 (×2): qty 1

## 2021-11-02 MED ORDER — ASPIRIN 81 MG PO TBEC
81.0000 mg | DELAYED_RELEASE_TABLET | Freq: Every morning | ORAL | Status: DC
  Administered 2021-11-02 – 2021-11-04 (×3): 81 mg via ORAL
  Filled 2021-11-02 (×3): qty 1

## 2021-11-02 MED ORDER — INSULIN ASPART 100 UNIT/ML IJ SOLN
0.0000 [IU] | Freq: Three times a day (TID) | INTRAMUSCULAR | Status: DC
  Administered 2021-11-02 – 2021-11-03 (×3): 3 [IU] via SUBCUTANEOUS
  Administered 2021-11-03: 2 [IU] via SUBCUTANEOUS

## 2021-11-02 MED ORDER — METOPROLOL TARTRATE 50 MG PO TABS
75.0000 mg | ORAL_TABLET | Freq: Two times a day (BID) | ORAL | Status: DC
  Administered 2021-11-02 – 2021-11-04 (×4): 75 mg via ORAL
  Filled 2021-11-02: qty 3
  Filled 2021-11-02 (×3): qty 1

## 2021-11-02 MED ORDER — TAMSULOSIN HCL 0.4 MG PO CAPS
0.4000 mg | ORAL_CAPSULE | Freq: Two times a day (BID) | ORAL | Status: DC
  Administered 2021-11-02 – 2021-11-04 (×4): 0.4 mg via ORAL
  Filled 2021-11-02 (×4): qty 1

## 2021-11-02 NOTE — Assessment & Plan Note (Signed)
-   We will continue Flomax 

## 2021-11-02 NOTE — Consult Note (Signed)
I was asked to provide recommendations in regards to the patient's cyst.  Chart reviewed.  Currently admitted for acute pyelonephritis.  CT shows no hydronephrosis.  No ureteral obstruction.  No renal or ureteral calculi.  No bladder calculi.  Incidentally shows a 1.5 cm simple cyst.  This requires no further work-up.  Also has an 8 mm isodense lesion incompletely characterized.  Radiology recommended renal ultrasound.  I agree with this.  Typically observe renal masses that are 4 mm in 59 year olds with surveillance imaging.  Can get renal ultrasound in 3 months.

## 2021-11-02 NOTE — Assessment & Plan Note (Signed)
-   We will continue Lopid.

## 2021-11-02 NOTE — H&P (Addendum)
[place]   PATIENT NAME: Carlos Gill    MR#:  [id number]  DATE OF BIRTH:  [birth year]  DATE OF ADMISSION:  11/01/2021  PRIMARY CARE PHYSICIAN: Burnard Bunting, MD   Patient is coming from: Home  REQUESTING/REFERRING PHYSICIAN: Mathews, April, MD  CHIEF COMPLAINT:   Chief Complaint  Patient presents with   Flank Pain    HISTORY OF PRESENT ILLNESS:  Carlos Gill is a 81 y.o. African-American male with medical history significant for type 2 diabetes mellitus, hypertension, dyslipidemia and OSA on CPAP, who presented to the emergency room with acute onset of right flank pain which has been going on over the last couple of days.  It has been radiating to his right groin.  He denies any nausea or vomiting.  No dysuria or urinary frequency or urgency or hematuria or flank pain.  No chest pain or palpitations.  No cough or wheezing or dyspnea.  He denies any fever or chills.  ED Course: When he came to the ER, BP was 143/69 with temperature of 100.3 and otherwise normal vital signs.  Labs revealed a BUN of 83 with a creatinine of 2.98 and a CBC with leukocytosis 12.1 with neutrophilia as well as anemia.  Urinalysis was positive for UTI and blood cultures as well as urine culture were drawn.  Renal stone study CT scan revealed the following:  EKG as reviewed by me : None. Imaging: CT renal stone study revealed the following: 1. Colonic diverticulosis. 2. Bilateral, moderate severity nonspecific perinephric inflammatory fat stranding, which may represent associated with acute pyelonephritis. Correlation with urinalysis is recommended. 3. Small right renal cyst. No additional follow-up imaging is recommended. This recommendation follows ACR consensus guidelines: Management of the Incidental Renal Mass on CT: A White Paper of the ACR Incidental Findings Committee. J Am Coll Radiol 2018;15:264-273. 4. 8 mm diameter round, isodense lesion along the anterior aspect of the lower pole  of the left kidney. While this may represent a small hemorrhagic cyst, further evaluation with renal ultrasound is recommended.  The patient was given IV Rocephin.  He will be admitted to a medical telemetry bed for further evaluation and management.  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   Diabetes mellitus without complication ([place])    Hyperlipidemia    Hypertension    Sleep apnea    cpap    PAST SURGICAL HISTORY:   Past Surgical History:  Procedure Laterality Date   ACHILLES TENDON REPAIR Left 2014   CATARACT EXTRACTION W/ INTRAOCULAR LENS  IMPLANT, BILATERAL  2011    SOCIAL HISTORY:   Social History   Tobacco Use   Smoking status: Former    Types: Cigarettes    Quit date: 02/01/1990    Years since quitting: 31.7   Smokeless tobacco: Never  Substance Use Topics   Alcohol use: No    FAMILY HISTORY:   Family History  Problem Relation Age of Onset   Diabetes Mother    Prostate cancer Maternal Grandfather    Colon cancer Neg Hx     DRUG ALLERGIES:   Allergies  Allergen Reactions   Statins Other (See Comments)    Muscle aches    REVIEW OF SYSTEMS:   ROS As per history of present illness. All pertinent systems were reviewed above. Constitutional, HEENT, cardiovascular, respiratory, GI, GU, musculoskeletal, neuro, psychiatric, endocrine, integumentary and hematologic systems were reviewed and are otherwise negative/unremarkable except  for positive findings mentioned above in the HPI.   MEDICATIONS AT HOME:   Prior to Admission medications   Medication Sig Start Date End Date Taking? Authorizing Provider  acetaminophen (TYLENOL) 500 MG tablet Take 1,000 mg by mouth daily as needed for moderate pain.   Yes [provider]  amLODipine (NORVASC) 2.5 MG tablet Take 10 mg by mouth daily. 01/31/21  Yes [provider]  aspirin EC 81 MG tablet Take 81 mg by mouth every morning.   Yes  [provider]  calcitRIOL (ROCALTROL) 0.25 MCG capsule Take 0.25 mcg by mouth daily. 02/13/21  Yes [provider]  Cholecalciferol (VITAMIN D3) 25 MCG (1000 UT) CAPS Take 1,000 Units by mouth daily. 02/20/20  Yes [provider]  ferrous sulfate 325 (65 FE) MG tablet Take 325 mg by mouth every Monday, Wednesday, and Friday. 02/13/21  Yes [provider]  gemfibrozil (LOPID) 600 MG tablet Take 600 mg by mouth 2 (two) times daily.   Yes [provider]  insulin aspart (NOVOLOG) 100 UNIT/ML injection Inject 6-18 Units into the skin 3 (three) times daily with meals. Per sliding scale   Yes [provider]  insulin glargine (LANTUS) 100 units/mL SOLN Inject 50 Units into the skin 2 (two) times daily.   Yes [provider]  losartan (COZAAR) 100 MG tablet Take 50 mg by mouth 2 (two) times daily.   Yes [provider]  Multiple Vitamins-Minerals (PRESERVISION AREDS 2) CAPS Take 1 capsule by mouth 2 (two) times daily.   Yes [provider]  omeprazole (PRILOSEC) 20 MG capsule Take 20 mg by mouth daily.   Yes [provider]  OZEMPIC, 0.25 OR 0.5 MG/DOSE, 2 MG/3ML SOPN Inject 0.5 mg into the skin once a week. Monday 08/05/21  Yes [provider]  polyvinyl alcohol (LIQUIFILM TEARS) 1.4 % ophthalmic solution Place 1 drop into both eyes daily.   Yes [provider]  tamsulosin (FLOMAX) 0.4 MG CAPS capsule Take 0.4 mg by mouth daily. Takes 2 tablets daily   Yes [provider]  benzonatate (TESSALON PERLES) 100 MG capsule Take 1 capsule (100 mg total) by mouth 3 (three) times daily as needed for cough. Patient not taking: Reported on 11/02/2021 05/02/16   Emeline General, PA-C  Multiple Vitamin (MULTIVITAMIN WITH MINERALS) TABS Take 1 tablet by mouth daily. Patient not taking: Reported on 11/02/2021    [provider]  potassium chloride SA (K-DUR,KLOR-CON) 20 MEQ tablet Take 20 mEq by  mouth daily as needed (for cramping).  Patient not taking: Reported on 11/02/2021    [provider]  trospium (SANCTURA) 20 MG tablet Take 20 mg by mouth 2 (two) times daily. Patient not taking: Reported on 11/02/2021    [provider]      VITAL SIGNS:  Blood pressure (!) 167/82, pulse 97, temperature 98.9 F (37.2 C), resp. rate 18, height 6' (1.829 m), weight 101 kg, SpO2 100 %.  PHYSICAL EXAMINATION:  Physical Exam  GENERAL:  81 y.o.-year-old African-American male patient lying in the bed with no acute distress.  EYES: Pupils equal, round, reactive to light and accommodation. No scleral icterus. Extraocular muscles intact.  HEENT: Head atraumatic, normocephalic. Oropharynx and nasopharynx clear.  NECK:  Supple, no jugular venous distention. No thyroid enlargement, no tenderness.  LUNGS: Normal breath sounds bilaterally, no wheezing, rales,rhonchi or crepitation. No use of accessory muscles of respiration.  CARDIOVASCULAR: Regular rate and rhythm, S1, S2 normal. No murmurs, rubs, or  gallops.  ABDOMEN: Soft, nondistended, nontender. Bowel sounds present. No organomegaly or mass.  Positive right CVA tenderness. EXTREMITIES: No pedal edema, cyanosis, or clubbing.  NEUROLOGIC: Cranial nerves II through XII are intact. Muscle strength 5/5 in all extremities. Sensation intact. Gait not checked.  PSYCHIATRIC: The patient is alert and oriented x 3.  Normal affect and good eye contact. SKIN: No obvious rash, lesion, or ulcer.   LABORATORY PANEL:   CBC Recent Labs  Lab 11/01/21 1930  WBC 12.1*  HGB 10.3*  HCT 31.7*  PLT 218   ------------------------------------------------------------------------------------------------------------------  Chemistries  Recent Labs  Lab 11/01/21 1930  NA 138  K 4.3  CL 110  CO2 18*  GLUCOSE 122*  BUN 33*  CREATININE 2.98*  CALCIUM 8.9  AST 16  ALT 11  ALKPHOS 87  BILITOT 0.5    ------------------------------------------------------------------------------------------------------------------  Cardiac Enzymes No results for input(s): "TROPONINI" in the last 168 hours. ------------------------------------------------------------------------------------------------------------------  RADIOLOGY:  CT Renal Stone Study  Result Date: 11/01/2021 CLINICAL DATA:  Right lower quadrant pain. EXAM: CT ABDOMEN AND PELVIS WITHOUT CONTRAST TECHNIQUE: Multidetector CT imaging of the abdomen and pelvis was performed following the standard protocol without IV contrast. RADIATION DOSE REDUCTION: This exam was performed according to the departmental dose-optimization program which includes automated exposure control, adjustment of the mA and/or kV according to patient size and/or use of iterative reconstruction technique. COMPARISON:  None Available. FINDINGS: Lower chest: No acute abnormality. Hepatobiliary: No focal liver abnormality is seen. No gallstones, gallbladder wall thickening, or biliary dilatation. Pancreas: Unremarkable. No pancreatic ductal dilatation or surrounding inflammatory changes. Spleen: Normal in size without focal abnormality. Adrenals/Urinary Tract: Adrenal glands are unremarkable. Kidneys are normal in size, without renal calculi or hydronephrosis. A 1.5 cm diameter cyst is seen within the anterior aspect of the mid right kidney. An 8 mm diameter round, isodense lesion is seen along the anterior aspect of the lower pole of the left kidney. There is bilateral, moderate severity nonspecific perinephric inflammatory fat stranding. The urinary bladder is poorly distended and subsequently limited in evaluation. Mild diffuse urinary bladder wall thickening is noted. Stomach/Bowel: Stomach is within normal limits. Appendix appears normal. No evidence of bowel wall thickening, distention, or inflammatory changes. Noninflamed diverticula are seen throughout the large bowel.  Vascular/Lymphatic: No significant vascular findings are present. No enlarged abdominal or pelvic lymph nodes. Reproductive: The prostate gland is not clearly identified. Other: No abdominal wall hernia or abnormality. No abdominopelvic ascites. Musculoskeletal: Multilevel degenerative changes seen throughout the lumbar spine. IMPRESSION: 1. Colonic diverticulosis. 2. Bilateral, moderate severity nonspecific perinephric inflammatory fat stranding, which may represent associated with acute pyelonephritis. Correlation with urinalysis is recommended. 3. Small right renal cyst. No additional follow-up imaging is recommended. This recommendation follows ACR consensus guidelines: Management of the Incidental Renal Mass on CT: A White Paper of the ACR Incidental Findings Committee. J Am Coll Radiol 2018;15:264-273. 4. 8 mm diameter round, isodense lesion along the anterior aspect of the lower pole of the left kidney. While this may represent a small hemorrhagic cyst, further evaluation with renal ultrasound is recommended. This recommendation follows ACR consensus guidelines: Management of the Incidental Renal Mass on CT: A White Paper of the ACR Incidental Findings Committee. J Am Coll Radiol [id number]. Electronically Signed   By: Virgina Norfolk M.D.   On: 11/01/2021 20:12      IMPRESSION AND PLAN:  Assessment and Plan: * Sepsis due to gram-negative UTI [facility]) - The patient will be admitted to a medical telemetry bed. -  We will continue antibiotic therapy with IV Rocephin. - We will check blood cultures and follow urine culture and sensitivity. - The patient will be hydrated with IV normal saline.  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 - This is associated with mild metabolic acidosis. - We will continue hydration with IV normal saline and follow BMP. - We will avoid nephrotoxins.   Acute  pyelonephritis - Management as above.  Renal mass, right - The patient also has a left renal lower pole renal mass that could be hemorrhagic cyst. - I discussed the case with Dr. Link Snuffer with urology who believes that follow-up for the patient can be arranged on an outpatient basis.  He will arrange a follow-up appointment.  Dyslipidemia - We will continue Lopid.  GERD without esophagitis - We will continue PPI therapy.  BPH (benign prostatic hyperplasia) - We will continue Flomax.  Essential hypertension We will continue Norvasc and Lopressor and hold off Cozaar given acute kidney injury.   DVT prophylaxis: Lovenox.  Advanced Care Planning:  Code Status: full code.  Family Communication:  The plan of care was discussed in details with the patient (and family). I answered all questions. The patient agreed to proceed with the above mentioned plan. Further management will depend upon hospital course. Disposition Plan: Back to previous home environment Consults called: none.  All the records are reviewed and case discussed with ED provider.  Status is: Inpatient  At the time of the admission, it appears that the appropriate admission status for this patient is inpatient.  This is judged to be reasonable and necessary in order to provide the required intensity of service to ensure the patient's safety given the presenting symptoms, physical exam findings and initial radiographic and laboratory data in the context of comorbid conditions.  The patient requires inpatient status due to high intensity of service, high risk of further deterioration and high frequency of surveillance required.  I certify that at the time of admission, it is my clinical judgment that the patient will require inpatient hospital care extending more than 2 midnights.                            Dispo: The patient is from: Home              Anticipated d/c is to: Home              Patient currently is not  medically stable to d/c.              Difficult to place patient: No  Christel Mormon M.D on 11/02/2021 at 6:28 AM  [facility]   From 7 PM-7 AM, contact night-coverage [email or web address]  CC: Primary care physician; Burnard Bunting, MD

## 2021-11-02 NOTE — Assessment & Plan Note (Signed)
-   We will continue PPI therapy 

## 2021-11-02 NOTE — Assessment & Plan Note (Signed)
We will continue Norvasc and Lopressor and hold off Cozaar given acute kidney injury.

## 2021-11-02 NOTE — Assessment & Plan Note (Addendum)
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 - This is associated with mild metabolic acidosis. - We will continue hydration with IV normal saline and follow BMP. - We will avoid nephrotoxins.

## 2021-11-02 NOTE — Progress Notes (Signed)
Patient is admitted early this am , detail please refer to HPI He is seen and examined, he reports he is doing better, right flank pain has improved, he appears a lot younger than states age, aaox3, NAD, appear to be a reliable historian, lung clears, RRR, no lower extremity edema, reports poor appetite for several days. Reports take lasix for  lower extremity edema, today does not appear to have edema  UTI/pyelonephritis/AKI, currently on Rocephin, culture in process. Hold home meds lasix and losartan Hypertension, bp elevated,  increase Lopressor, continue norvasc, add on prn labetalol ,need to hold losartan due to aki Insulin-dependent type 2 diabetes, a.m. blood glucose 152, continue lantus 46unit bid, change diet to carb mod, add ssi, reports a1c was 7.3 in 07/2021, he has a continous blood glucose monitoring device Reports h/o OSA, does not use cpap, does not use home o2

## 2021-11-02 NOTE — Assessment & Plan Note (Signed)
-   The patient will be admitted to a medical telemetry bed. - We will continue antibiotic therapy with IV Rocephin. - We will check blood cultures and follow urine culture and sensitivity. - The patient will be hydrated with IV normal saline.

## 2021-11-02 NOTE — ED Notes (Signed)
One blue blood culture obtained. Iv team was placed due to multiple attempts

## 2021-11-02 NOTE — Assessment & Plan Note (Signed)
Management as above °

## 2021-11-02 NOTE — Assessment & Plan Note (Signed)
-   The patient also has a left renal lower pole renal mass that could be hemorrhagic cyst. - I discussed the case with Dr. Link Snuffer with urology who believes that follow-up for the patient can be arranged on an outpatient basis.  He will arrange a follow-up appointment.

## 2021-11-02 NOTE — Discharge Instructions (Signed)
Please call [facility] [phone] for follow-up in regards to your kidney cyst.

## 2021-11-02 NOTE — ED Notes (Signed)
ED TO INPATIENT HANDOFF REPORT  ED Nurse Name and Phone #: Philip Aspen Name/Age/Gender Carlos Gill 81 y.o. male Room/Bed: 037C/037C  Code Status   Code Status: Full Code  Home/SNF/Other Home Patient oriented to: self, place, and time Is this baseline? Yes   Triage Complete: Triage complete  Chief Complaint Sepsis due to gram-negative UTI ([place]) [A41.50, N39.0]  Triage Note Patient reports right lower flank pain radiating down to right lower abdomen and groin , denies injury , no hematuria or dysuria .    Allergies Allergies  Allergen Reactions   Statins Other (See Comments)    Muscle aches    Level of Care/Admitting Diagnosis ED Disposition     ED Disposition  Admit   Condition  --   Comment  Hospital Area: [facility] [[id number]]  Level of Care: Telemetry Medical [[id number]]  May admit patient to [facility] or [facility] if equivalent level of care is available:: No  Covid Evaluation: Asymptomatic - no recent exposure (last 10 days) testing not required  Diagnosis: Sepsis due to gram-negative UTI [facility]) [3220254]  Admitting Physician: Christel Mormon [[id number]]  Attending Physician: Christel Mormon [[id number]]  Certification:: I certify this patient will need inpatient services for at least 2 midnights  Estimated Length of Stay: 2          B Medical/Surgery History Past Medical History:  Diagnosis Date   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   Diabetes mellitus without complication ([place])    Hyperlipidemia    Hypertension    Sleep apnea    cpap   Past Surgical History:  Procedure Laterality Date   ACHILLES TENDON REPAIR Left 2014   CATARACT EXTRACTION W/ INTRAOCULAR LENS  IMPLANT, BILATERAL  2011     A IV Location/Drains/Wounds Patient Lines/Drains/Airways Status     Active Line/Drains/Airways     Name Placement date Placement time Site Days   Peripheral IV 11/02/21 22 G 1" Right;Anterior Forearm 11/02/21  0646  Forearm  less than 1             Intake/Output Last 24 hours  Intake/Output Summary (Last 24 hours) at 11/02/2021 2150 Last data filed at 11/02/2021 1702 Gross per 24 hour  Intake 1810.34 ml  Output --  Net 1810.34 ml    Labs/Imaging Results for orders placed or performed during the hospital encounter of 11/01/21 (from the past 48 hour(s))  Urinalysis, Routine w reflex microscopic     Status: Abnormal   Collection Time: 11/01/21  7:24 PM  Result Value Ref Range   Color, Urine YELLOW YELLOW   APPearance CLOUDY (A) CLEAR   Specific Gravity, Urine 1.013 1.005 - 1.030   pH 5.0 5.0 - 8.0   Glucose, UA NEGATIVE NEGATIVE mg/dL   Hgb urine dipstick SMALL (A) NEGATIVE   Bilirubin Urine NEGATIVE NEGATIVE   Ketones, ur NEGATIVE NEGATIVE mg/dL   Protein, ur >=300 (A) NEGATIVE mg/dL   Nitrite NEGATIVE NEGATIVE   Leukocytes,Ua LARGE (A) NEGATIVE   RBC / HPF 11-20 0 - 5 RBC/hpf   WBC, UA >50 (H) 0 - 5 WBC/hpf   Bacteria, UA MANY (A) NONE SEEN   Squamous Epithelial / LPF 0-5 0 - 5   WBC Clumps PRESENT    Mucus PRESENT    Non Squamous Epithelial 0-5 (A) NONE SEEN    Comment: Performed at [facility], [address] [place], [place] [address]  CBC with Differential     Status: Abnormal  Collection Time: 11/01/21  7:30 PM  Result Value Ref Range   WBC 12.1 (H) 4.0 - 10.5 K/uL   RBC 3.67 (L) 4.22 - 5.81 MIL/uL   Hemoglobin 10.3 (L) 13.0 - 17.0 g/dL   HCT 31.7 (L) 39.0 - 52.0 %   MCV 86.4 80.0 - 100.0 fL   MCH 28.1 26.0 - 34.0 pg   MCHC 32.5 30.0 - 36.0 g/dL   RDW 13.8 11.5 - 15.5 %   Platelets 218 150 - 400 K/uL   nRBC 0.0 0.0 - 0.2 %   Neutrophils Relative % 79 %   Neutro Abs 9.3 (H) 1.7 - 7.7 K/uL   Lymphocytes Relative 11 %   Lymphs Abs 1.4 0.7 - 4.0 K/uL   Monocytes Relative 10 %   Monocytes Absolute 1.3 (H) 0.1 - 1.0 K/uL   Eosinophils Relative 0 %   Eosinophils Absolute 0.0 0.0 - 0.5 K/uL   Basophils Relative 0 %   Basophils Absolute 0.0 0.0 - 0.1 K/uL   Immature Granulocytes 0 %   Abs  Immature Granulocytes 0.04 0.00 - 0.07 K/uL    Comment: Performed at [facility], [address] [place], [place] [address]  Comprehensive metabolic panel     Status: Abnormal   Collection Time: 11/01/21  7:30 PM  Result Value Ref Range   Sodium 138 135 - 145 mmol/L   Potassium 4.3 3.5 - 5.1 mmol/L   Chloride 110 98 - 111 mmol/L   CO2 18 (L) 22 - 32 mmol/L   Glucose, Bld 122 (H) 70 - 99 mg/dL    Comment: Glucose reference range applies only to samples taken after fasting for at least 8 hours.   BUN 33 (H) 8 - 23 mg/dL   Creatinine, Ser 2.98 (H) 0.61 - 1.24 mg/dL   Calcium 8.9 8.9 - 10.3 mg/dL   Total Protein 6.8 6.5 - 8.1 g/dL   Albumin 3.1 (L) 3.5 - 5.0 g/dL   AST 16 15 - 41 U/L   ALT 11 0 - 44 U/L   Alkaline Phosphatase 87 38 - 126 U/L   Total Bilirubin 0.5 0.3 - 1.2 mg/dL   GFR, Estimated 20 (L) >60 mL/min    Comment: (NOTE) Calculated using the CKD-EPI Creatinine Equation (2021)    Anion gap 10 5 - 15    Comment: Performed at [facility] [address], [place], [place] [address]  Lipase, blood     Status: None   Collection Time: 11/01/21  7:30 PM  Result Value Ref Range   Lipase 25 11 - 51 U/L    Comment: Performed at [facility] [address] [place], [place] [address]  Protime-INR     Status: Abnormal   Collection Time: 11/02/21  6:36 AM  Result Value Ref Range   Prothrombin Time 16.9 (H) 11.4 - 15.2 seconds   INR 1.4 (H) 0.8 - 1.2    Comment: (NOTE) INR goal varies based on device and disease states. Performed at [facility], [place] [address], [place], [place] [address]   Cortisol-am, blood     Status: None   Collection Time: 11/02/21  6:36 AM  Result Value Ref Range   Cortisol - AM 17.6 6.7 - 22.6 ug/dL    Comment: Performed at [facility] [address] [place], [place] [address]  Procalcitonin     Status: None   Collection Time: 11/02/21  6:36 AM  Result Value Ref  Range   Procalcitonin 0.61 ng/mL    Comment:         Interpretation: PCT > 0.5 ng/mL and <= 2 ng/mL: Systemic infection (sepsis) is possible, but other conditions are known to elevate PCT as well. (NOTE)       Sepsis PCT Algorithm           Lower Respiratory Tract                                      Infection PCT Algorithm    ----------------------------     ----------------------------         PCT < 0.25 ng/mL                PCT < 0.10 ng/mL          Strongly encourage             Strongly discourage   discontinuation of antibiotics    initiation of antibiotics    ----------------------------     -----------------------------       PCT 0.25 - 0.50 ng/mL            PCT 0.10 - 0.25 ng/mL               OR       >80% decrease in PCT            Discourage initiation of                                            antibiotics      Encourage discontinuation           of antibiotics    ----------------------------     -----------------------------         PCT >= 0.50 ng/mL              PCT 0.26 - 0.50 ng/mL                AND       <80% decrease in PCT             Encourage initiation of                                             antibiotics       Encourage continuation           of antibiotics    ----------------------------     -----------------------------        PCT >= 0.50 ng/mL                  PCT > 0.50 ng/mL               AND         increase in PCT                  Strongly encourage                                      initiation of antibiotics    Strongly encourage escalation  of antibiotics                                     -----------------------------                                           PCT <= 0.25 ng/mL                                                 OR                                        > 80% decrease in PCT                                      Discontinue / Do not initiate                                             antibiotics  Performed at [facility], [place] [address],  [place], [place] [address]   Basic metabolic panel     Status: Abnormal   Collection Time: 11/02/21  6:36 AM  Result Value Ref Range   Sodium 140 135 - 145 mmol/L   Potassium 4.2 3.5 - 5.1 mmol/L   Chloride 112 (H) 98 - 111 mmol/L   CO2 19 (L) 22 - 32 mmol/L   Glucose, Bld 152 (H) 70 - 99 mg/dL    Comment: Glucose reference range applies only to samples taken after fasting for at least 8 hours.   BUN 36 (H) 8 - 23 mg/dL   Creatinine, Ser 2.92 (H) 0.61 - 1.24 mg/dL   Calcium 8.9 8.9 - 10.3 mg/dL   GFR, Estimated 21 (L) >60 mL/min    Comment: (NOTE) Calculated using the CKD-EPI Creatinine Equation (2021)    Anion gap 9 5 - 15    Comment: Performed at [facility] [address] [place], [place] [address]  CBC     Status: Abnormal   Collection Time: 11/02/21  6:36 AM  Result Value Ref Range   WBC 12.4 (H) 4.0 - 10.5 K/uL   RBC 3.39 (L) 4.22 - 5.81 MIL/uL   Hemoglobin 9.4 (L) 13.0 - 17.0 g/dL   HCT 28.8 (L) 39.0 - 52.0 %   MCV 85.0 80.0 - 100.0 fL   MCH 27.7 26.0 - 34.0 pg   MCHC 32.6 30.0 - 36.0 g/dL   RDW 13.8 11.5 - 15.5 %   Platelets 211 150 - 400 K/uL   nRBC 0.0 0.0 - 0.2 %    Comment: Performed at [facility], [place] [address] [place], [place] [address]  CBG monitoring, ED     Status: Abnormal   Collection Time: 11/02/21 12:25 PM  Result Value Ref Range   Glucose-Capillary 161 (H) 70 - 99 mg/dL    Comment: Glucose reference range  applies only to samples taken after fasting for at least 8 hours.  CBG monitoring, ED     Status: Abnormal   Collection Time: 11/02/21  5:58 PM  Result Value Ref Range   Glucose-Capillary 111 (H) 70 - 99 mg/dL    Comment: Glucose reference range applies only to samples taken after fasting for at least 8 hours.  CBG monitoring, ED     Status: Abnormal   Collection Time: 11/02/21  9:43 PM  Result Value Ref Range   Glucose-Capillary 158 (H) 70 - 99 mg/dL    Comment: Glucose reference range applies only to samples taken after fasting  for at least 8 hours.   CT Renal Stone Study  Result Date: 11/01/2021 CLINICAL DATA:  Right lower quadrant pain. EXAM: CT ABDOMEN AND PELVIS WITHOUT CONTRAST TECHNIQUE: Multidetector CT imaging of the abdomen and pelvis was performed following the standard protocol without IV contrast. RADIATION DOSE REDUCTION: This exam was performed according to the departmental dose-optimization program which includes automated exposure control, adjustment of the mA and/or kV according to patient size and/or use of iterative reconstruction technique. COMPARISON:  None Available. FINDINGS: Lower chest: No acute abnormality. Hepatobiliary: No focal liver abnormality is seen. No gallstones, gallbladder wall thickening, or biliary dilatation. Pancreas: Unremarkable. No pancreatic ductal dilatation or surrounding inflammatory changes. Spleen: Normal in size without focal abnormality. Adrenals/Urinary Tract: Adrenal glands are unremarkable. Kidneys are normal in size, without renal calculi or hydronephrosis. A 1.5 cm diameter cyst is seen within the anterior aspect of the mid right kidney. An 8 mm diameter round, isodense lesion is seen along the anterior aspect of the lower pole of the left kidney. There is bilateral, moderate severity nonspecific perinephric inflammatory fat stranding. The urinary bladder is poorly distended and subsequently limited in evaluation. Mild diffuse urinary bladder wall thickening is noted. Stomach/Bowel: Stomach is within normal limits. Appendix appears normal. No evidence of bowel wall thickening, distention, or inflammatory changes. Noninflamed diverticula are seen throughout the large bowel. Vascular/Lymphatic: No significant vascular findings are present. No enlarged abdominal or pelvic lymph nodes. Reproductive: The prostate gland is not clearly identified. Other: No abdominal wall hernia or abnormality. No abdominopelvic ascites. Musculoskeletal: Multilevel degenerative changes seen throughout  the lumbar spine. IMPRESSION: 1. Colonic diverticulosis. 2. Bilateral, moderate severity nonspecific perinephric inflammatory fat stranding, which may represent associated with acute pyelonephritis. Correlation with urinalysis is recommended. 3. Small right renal cyst. No additional follow-up imaging is recommended. This recommendation follows ACR consensus guidelines: Management of the Incidental Renal Mass on CT: A White Paper of the ACR Incidental Findings Committee. J Am Coll Radiol 2018;15:264-273. 4. 8 mm diameter round, isodense lesion along the anterior aspect of the lower pole of the left kidney. While this may represent a small hemorrhagic cyst, further evaluation with renal ultrasound is recommended. This recommendation follows ACR consensus guidelines: Management of the Incidental Renal Mass on CT: A White Paper of the ACR Incidental Findings Committee. J Am Coll Radiol [phone]. Electronically Signed   By: Virgina Norfolk M.D.   On: 11/01/2021 20:12    Pending Labs Unresulted Labs (From admission, onward)     Start     Ordered   11/03/21 0500  Hemoglobin A1c  Tomorrow morning,   R       Comments: To assess prior glycemic control    11/02/21 0738   11/03/21 0500  CBC with Differential/Platelet  Tomorrow morning,   R        11/02/21 1507   11/03/21  5188  Basic metabolic panel  Tomorrow morning,   R        11/02/21 1507   11/02/21 0351  Urine Culture  Once,   URGENT       Question Answer Comment  Indication Dysuria   Patient immune status Normal   Release to patient Immediate      11/02/21 0350   11/02/21 0351  Blood culture (routine x 2)  BLOOD CULTURE X 2,   R (with STAT occurrences)     Question:  Patient immune status  Answer:  Normal   11/02/21 0350            Vitals/Pain Today's Vitals   11/02/21 1745 11/02/21 1800 11/02/21 1810 11/02/21 2101  BP: (!) 147/66 (!) 152/69  (!) 155/77  Pulse: 64 81  79  Resp: (!) '27 19  18  '$ Temp:  99.1 F (37.3 C)     TempSrc:  Oral    SpO2: 100% 96%  99%  Weight:      Height:      PainSc:   0-No pain 0-No pain    Isolation Precautions No active isolations  Medications Medications  0.9 %  sodium chloride infusion ( Intravenous Not Given 11/02/21 0708)  aspirin EC tablet 81 mg (81 mg Oral Given 11/02/21 1130)  gemfibrozil (LOPID) tablet 600 mg (0 mg Oral Hold 11/02/21 1104)  pantoprazole (PROTONIX) EC tablet 40 mg (40 mg Oral Given 11/02/21 1130)  tamsulosin (FLOMAX) capsule 0.4 mg (0 mg Oral Hold 11/02/21 1106)  multivitamin with minerals tablet 1 tablet (1 tablet Oral Given 11/02/21 1131)  multivitamin (PROSIGHT) tablet 1 tablet (0 capsules Oral Hold 11/02/21 1105)  polyvinyl alcohol (LIQUIFILM TEARS) 1.4 % ophthalmic solution 1 drop (0 drops Both Eyes Hold 11/02/21 1106)  enoxaparin (LOVENOX) injection 30 mg (30 mg Subcutaneous Given 11/02/21 1622)  0.9 %  sodium chloride infusion ( Intravenous New Bag/Given 11/02/21 1705)  cefTRIAXone (ROCEPHIN) 2 g in sodium chloride 0.9 % 100 mL IVPB (0 g Intravenous Stopped 11/02/21 0745)  acetaminophen (TYLENOL) tablet 650 mg (650 mg Oral Given 11/02/21 1706)    Or  acetaminophen (TYLENOL) suppository 650 mg ( Rectal See Alternative 11/02/21 1706)  traZODone (DESYREL) tablet 25 mg (has no administration in time range)  magnesium hydroxide (MILK OF MAGNESIA) suspension 30 mL (has no administration in time range)  ondansetron (ZOFRAN) tablet 4 mg (has no administration in time range)    Or  ondansetron (ZOFRAN) injection 4 mg (has no administration in time range)  insulin aspart (novoLOG) injection 0-15 Units (0 Units Subcutaneous Not Given 11/02/21 1806)  insulin glargine-yfgn (SEMGLEE) injection 46 Units (46 Units Subcutaneous Given 11/02/21 1623)  amLODipine (NORVASC) tablet 10 mg (10 mg Oral Given 11/02/21 1622)  metoprolol tartrate (LOPRESSOR) tablet 75 mg (has no administration in time range)  labetalol (NORMODYNE) injection 10 mg (has no administration in time  range)    Mobility walks Moderate fall risk   Focused Assessments Renal Assessment Handoff:  Hemodialysis Schedule:   Last Hemodialysis date and time:    Restricted appendage:    R Recommendations: See Admitting Provider Note  Report given to:   Additional Notes:

## 2021-11-02 NOTE — ED Provider Notes (Addendum)
[facility] EMERGENCY DEPARTMENT Provider Note   CSN: [id number] Arrival date & time: 11/01/21  1914     History  Chief Complaint  Patient presents with   Flank Pain    Carlos Gill is a 81 y.o. male.  The history is provided by the patient and medical records.  Flank Pain This is a new problem. The current episode started 2 days ago. The problem occurs constantly. The problem has not changed since onset.Pertinent negatives include no chest pain. Associated symptoms comments: R groin pain . Nothing aggravates the symptoms. Nothing relieves the symptoms. He has tried nothing for the symptoms. The treatment provided no relief.       Home Medications Prior to Admission medications   Medication Sig Start Date End Date Taking? Authorizing Provider  aspirin EC 81 MG tablet Take 81 mg by mouth every morning.    [provider]  benzonatate (TESSALON PERLES) 100 MG capsule Take 1 capsule (100 mg total) by mouth 3 (three) times daily as needed for cough. 05/02/16   Emeline General, PA-C  DM-Doxylamine-Acetaminophen (NYQUIL COLD & FLU PO) Take 30 mLs by mouth daily as needed (cold).    [provider]  gemfibrozil (LOPID) 600 MG tablet Take 600 mg by mouth 2 (two) times daily.    [provider]  insulin aspart (NOVOLOG) 100 UNIT/ML injection Inject 6-18 Units into the skin 3 (three) times daily with meals.    [provider]  insulin glargine (LANTUS) 100 units/mL SOLN Inject 46 Units into the skin 2 (two) times daily.     [provider]  losartan (COZAAR) 100 MG tablet Take 100 mg by mouth daily.    [provider]  meloxicam (MOBIC) 15 MG tablet Take 15 mg by mouth daily. 05/14/14   [provider]  Multiple Vitamin (MULTIVITAMIN WITH MINERALS) TABS Take 1 tablet by mouth daily.    [provider]  Multiple Vitamins-Minerals (PRESERVISION AREDS 2) CAPS Take 1 capsule by mouth 2 (two) times daily.     [provider]  omeprazole (PRILOSEC) 20 MG capsule Take 20 mg by mouth daily.    [provider]  polyvinyl alcohol (LIQUIFILM TEARS) 1.4 % ophthalmic solution Place 1 drop into both eyes daily.    [provider]  potassium chloride SA (K-DUR,KLOR-CON) 20 MEQ tablet Take 20 mEq by mouth daily as needed (for cramping).     [provider]  tamsulosin (FLOMAX) 0.4 MG CAPS capsule Take 0.4 mg by mouth 2 (two) times daily. Takes 2 tablets daily     [provider]  triamterene-hydrochlorothiazide (MAXZIDE-25) 37.5-25 MG per tablet Take 1 tablet by mouth daily.    [provider]  trospium (SANCTURA) 20 MG tablet Take 20 mg by mouth 2 (two) times daily.    [provider]      Allergies    Patient has no known allergies.    Review of Systems   Review of Systems  Constitutional:  Negative for fever.  HENT:  Negative for facial swelling.   Eyes:  Negative for redness.  Respiratory:  Negative for wheezing and stridor.   Cardiovascular:  Negative for chest pain.  Gastrointestinal:  Negative for vomiting.  Genitourinary:  Positive for flank pain.       R groin pain    Physical Exam Updated Vital Signs BP (!) 160/75 (BP Location: Right Arm)   Pulse 98   Temp 99.1 F (37.3 C) (Oral)  Resp 18   SpO2 97%  Physical Exam Vitals and nursing note reviewed.  Constitutional:      General: He is not in acute distress.    Appearance: He is well-developed. He is not diaphoretic.  HENT:     Head: Normocephalic and atraumatic.     Nose: Nose normal.  Eyes:     Conjunctiva/sclera: Conjunctivae normal.     Pupils: Pupils are equal, round, and reactive to light.  Cardiovascular:     Rate and Rhythm: Normal rate and regular rhythm.     Pulses: Normal pulses.     Heart sounds: Normal heart sounds.  Pulmonary:     Effort: Pulmonary effort is normal.     Breath sounds: Normal breath sounds. No wheezing or rales.  Abdominal:      General: Bowel sounds are normal.     Palpations: Abdomen is soft.     Tenderness: There is abdominal tenderness. There is no guarding or rebound. Negative signs include Murphy's sign, Rovsing's sign and McBurney's sign.    Musculoskeletal:        General: Normal range of motion.     Cervical back: Normal range of motion and neck supple.  Skin:    General: Skin is warm and dry.     Capillary Refill: Capillary refill takes less than 2 seconds.  Neurological:     General: No focal deficit present.     Mental Status: He is alert and oriented to person, place, and time.  Psychiatric:        Mood and Affect: Mood normal.        Behavior: Behavior normal.     ED Results / Procedures / Treatments   Labs (all labs ordered are listed, but only abnormal results are displayed) Results for orders placed or performed during the hospital encounter of 11/01/21  CBC with Differential  Result Value Ref Range   WBC 12.1 (H) 4.0 - 10.5 K/uL   RBC 3.67 (L) 4.22 - 5.81 MIL/uL   Hemoglobin 10.3 (L) 13.0 - 17.0 g/dL   HCT 31.7 (L) 39.0 - 52.0 %   MCV 86.4 80.0 - 100.0 fL   MCH 28.1 26.0 - 34.0 pg   MCHC 32.5 30.0 - 36.0 g/dL   RDW 13.8 11.5 - 15.5 %   Platelets 218 150 - 400 K/uL   nRBC 0.0 0.0 - 0.2 %   Neutrophils Relative % 79 %   Neutro Abs 9.3 (H) 1.7 - 7.7 K/uL   Lymphocytes Relative 11 %   Lymphs Abs 1.4 0.7 - 4.0 K/uL   Monocytes Relative 10 %   Monocytes Absolute 1.3 (H) 0.1 - 1.0 K/uL   Eosinophils Relative 0 %   Eosinophils Absolute 0.0 0.0 - 0.5 K/uL   Basophils Relative 0 %   Basophils Absolute 0.0 0.0 - 0.1 K/uL   Immature Granulocytes 0 %   Abs Immature Granulocytes 0.04 0.00 - 0.07 K/uL  Comprehensive metabolic panel  Result Value Ref Range   Sodium 138 135 - 145 mmol/L   Potassium 4.3 3.5 - 5.1 mmol/L   Chloride 110 98 - 111 mmol/L   CO2 18 (L) 22 - 32 mmol/L   Glucose, Bld 122 (H) 70 - 99 mg/dL   BUN 33 (H) 8 - 23 mg/dL   Creatinine, Ser 2.98 (H) 0.61 - 1.24 mg/dL    Calcium 8.9 8.9 - 10.3 mg/dL   Total Protein 6.8 6.5 - 8.1 g/dL   Albumin 3.1 (L) 3.5 -  5.0 g/dL   AST 16 15 - 41 U/L   ALT 11 0 - 44 U/L   Alkaline Phosphatase 87 38 - 126 U/L   Total Bilirubin 0.5 0.3 - 1.2 mg/dL   GFR, Estimated 20 (L) >60 mL/min   Anion gap 10 5 - 15  Lipase, blood  Result Value Ref Range   Lipase 25 11 - 51 U/L  Urinalysis, Routine w reflex microscopic  Result Value Ref Range   Color, Urine YELLOW YELLOW   APPearance CLOUDY (A) CLEAR   Specific Gravity, Urine 1.013 1.005 - 1.030   pH 5.0 5.0 - 8.0   Glucose, UA NEGATIVE NEGATIVE mg/dL   Hgb urine dipstick SMALL (A) NEGATIVE   Bilirubin Urine NEGATIVE NEGATIVE   Ketones, ur NEGATIVE NEGATIVE mg/dL   Protein, ur >=300 (A) NEGATIVE mg/dL   Nitrite NEGATIVE NEGATIVE   Leukocytes,Ua LARGE (A) NEGATIVE   RBC / HPF 11-20 0 - 5 RBC/hpf   WBC, UA >50 (H) 0 - 5 WBC/hpf   Bacteria, UA MANY (A) NONE SEEN   Squamous Epithelial / LPF 0-5 0 - 5   WBC Clumps PRESENT    Mucus PRESENT    Non Squamous Epithelial 0-5 (A) NONE SEEN   CT Renal Stone Study  Result Date: 11/01/2021 CLINICAL DATA:  Right lower quadrant pain. EXAM: CT ABDOMEN AND PELVIS WITHOUT CONTRAST TECHNIQUE: Multidetector CT imaging of the abdomen and pelvis was performed following the standard protocol without IV contrast. RADIATION DOSE REDUCTION: This exam was performed according to the departmental dose-optimization program which includes automated exposure control, adjustment of the mA and/or kV according to patient size and/or use of iterative reconstruction technique. COMPARISON:  None Available. FINDINGS: Lower chest: No acute abnormality. Hepatobiliary: No focal liver abnormality is seen. No gallstones, gallbladder wall thickening, or biliary dilatation. Pancreas: Unremarkable. No pancreatic ductal dilatation or surrounding inflammatory changes. Spleen: Normal in size without focal abnormality. Adrenals/Urinary Tract: Adrenal glands are unremarkable.  Kidneys are normal in size, without renal calculi or hydronephrosis. A 1.5 cm diameter cyst is seen within the anterior aspect of the mid right kidney. An 8 mm diameter round, isodense lesion is seen along the anterior aspect of the lower pole of the left kidney. There is bilateral, moderate severity nonspecific perinephric inflammatory fat stranding. The urinary bladder is poorly distended and subsequently limited in evaluation. Mild diffuse urinary bladder wall thickening is noted. Stomach/Bowel: Stomach is within normal limits. Appendix appears normal. No evidence of bowel wall thickening, distention, or inflammatory changes. Noninflamed diverticula are seen throughout the large bowel. Vascular/Lymphatic: No significant vascular findings are present. No enlarged abdominal or pelvic lymph nodes. Reproductive: The prostate gland is not clearly identified. Other: No abdominal wall hernia or abnormality. No abdominopelvic ascites. Musculoskeletal: Multilevel degenerative changes seen throughout the lumbar spine. IMPRESSION: 1. Colonic diverticulosis. 2. Bilateral, moderate severity nonspecific perinephric inflammatory fat stranding, which may represent associated with acute pyelonephritis. Correlation with urinalysis is recommended. 3. Small right renal cyst. No additional follow-up imaging is recommended. This recommendation follows ACR consensus guidelines: Management of the Incidental Renal Mass on CT: A White Paper of the ACR Incidental Findings Committee. J Am Coll Radiol 2018;15:264-273. 4. 8 mm diameter round, isodense lesion along the anterior aspect of the lower pole of the left kidney. While this may represent a small hemorrhagic cyst, further evaluation with renal ultrasound is recommended. This recommendation follows ACR consensus guidelines: Management of the Incidental Renal Mass on CT: A White Paper of the ACR Incidental  Findings Committee. J Am Coll Radiol [phone]. Electronically Signed   By:  Virgina Norfolk M.D.   On: 11/01/2021 20:12    EKG None  Radiology CT Renal Stone Study  Result Date: 11/01/2021 CLINICAL DATA:  Right lower quadrant pain. EXAM: CT ABDOMEN AND PELVIS WITHOUT CONTRAST TECHNIQUE: Multidetector CT imaging of the abdomen and pelvis was performed following the standard protocol without IV contrast. RADIATION DOSE REDUCTION: This exam was performed according to the departmental dose-optimization program which includes automated exposure control, adjustment of the mA and/or kV according to patient size and/or use of iterative reconstruction technique. COMPARISON:  None Available. FINDINGS: Lower chest: No acute abnormality. Hepatobiliary: No focal liver abnormality is seen. No gallstones, gallbladder wall thickening, or biliary dilatation. Pancreas: Unremarkable. No pancreatic ductal dilatation or surrounding inflammatory changes. Spleen: Normal in size without focal abnormality. Adrenals/Urinary Tract: Adrenal glands are unremarkable. Kidneys are normal in size, without renal calculi or hydronephrosis. A 1.5 cm diameter cyst is seen within the anterior aspect of the mid right kidney. An 8 mm diameter round, isodense lesion is seen along the anterior aspect of the lower pole of the left kidney. There is bilateral, moderate severity nonspecific perinephric inflammatory fat stranding. The urinary bladder is poorly distended and subsequently limited in evaluation. Mild diffuse urinary bladder wall thickening is noted. Stomach/Bowel: Stomach is within normal limits. Appendix appears normal. No evidence of bowel wall thickening, distention, or inflammatory changes. Noninflamed diverticula are seen throughout the large bowel. Vascular/Lymphatic: No significant vascular findings are present. No enlarged abdominal or pelvic lymph nodes. Reproductive: The prostate gland is not clearly identified. Other: No abdominal wall hernia or abnormality. No abdominopelvic ascites. Musculoskeletal:  Multilevel degenerative changes seen throughout the lumbar spine. IMPRESSION: 1. Colonic diverticulosis. 2. Bilateral, moderate severity nonspecific perinephric inflammatory fat stranding, which may represent associated with acute pyelonephritis. Correlation with urinalysis is recommended. 3. Small right renal cyst. No additional follow-up imaging is recommended. This recommendation follows ACR consensus guidelines: Management of the Incidental Renal Mass on CT: A White Paper of the ACR Incidental Findings Committee. J Am Coll Radiol 2018;15:264-273. 4. 8 mm diameter round, isodense lesion along the anterior aspect of the lower pole of the left kidney. While this may represent a small hemorrhagic cyst, further evaluation with renal ultrasound is recommended. This recommendation follows ACR consensus guidelines: Management of the Incidental Renal Mass on CT: A White Paper of the ACR Incidental Findings Committee. J Am Coll Radiol [phone]. Electronically Signed   By: Virgina Norfolk M.D.   On: 11/01/2021 20:12    Procedures Procedures    Medications Ordered in ED Medications  cefTRIAXone (ROCEPHIN) 1 g in sodium chloride 0.9 % 100 mL IVPB (has no administration in time range)  0.9 %  sodium chloride infusion (has no administration in time range)      ED Course/ Medical Decision Making/ A&P                           Medical Decision Making Patient with 2 days of R flank pain and R groin pain No nausea and vomiting   Amount and/or Complexity of Data Reviewed External Data Reviewed: notes.    Details: Previous ED notes reviewed  Labs: ordered.    Details: All labs reviewed:  white count elevated 12.1, hemoglobin low 10.3, platelets normal 213K.  Urine is consistent with infection. Lipase is normal 25.  Normal sodium 138, normal potassium 4.3, BUN elevated 33, creatinine elevated  2.98. Normal LFTs Radiology: ordered and independent interpretation performed.    Details: Pyelonephritis on  CT by me   Risk Prescription drug management. Decision regarding hospitalization.    Final Clinical Impression(s) / ED Diagnoses Final diagnoses:  Pyelonephritis  Renal lesion   The patient appears reasonably stabilized for admission considering the current resources, flow, and capabilities available in the ED at this time, and I doubt any other [facility] requiring further screening and/or treatment in the ED prior to admission.      Reade Trefz, MD 11/02/21 [phone]

## 2021-11-03 ENCOUNTER — Inpatient Hospital Stay (HOSPITAL_COMMUNITY): Payer: Medicare Other

## 2021-11-03 DIAGNOSIS — A4150 Gram-negative sepsis, unspecified: Secondary | ICD-10-CM | POA: Diagnosis not present

## 2021-11-03 DIAGNOSIS — N390 Urinary tract infection, site not specified: Secondary | ICD-10-CM | POA: Diagnosis not present

## 2021-11-03 LAB — GLUCOSE, CAPILLARY
Glucose-Capillary: 106 mg/dL — ABNORMAL HIGH (ref 70–99)
Glucose-Capillary: 46 mg/dL — ABNORMAL LOW (ref 70–99)
Glucose-Capillary: 126 mg/dL — ABNORMAL HIGH (ref 70–99)
Glucose-Capillary: 87 mg/dL (ref 70–99)
Glucose-Capillary: 158 mg/dL — ABNORMAL HIGH (ref 70–99)
Glucose-Capillary: 181 mg/dL — ABNORMAL HIGH (ref 70–99)

## 2021-11-03 LAB — BASIC METABOLIC PANEL
Anion gap: 11 (ref 5–15)
BUN: 37 mg/dL — ABNORMAL HIGH (ref 8–23)
CO2: 18 mmol/L — ABNORMAL LOW (ref 22–32)
Calcium: 8.6 mg/dL — ABNORMAL LOW (ref 8.9–10.3)
Chloride: 112 mmol/L — ABNORMAL HIGH (ref 98–111)
Creatinine, Ser: 2.85 mg/dL — ABNORMAL HIGH (ref 0.61–1.24)
GFR, Estimated: 22 mL/min — ABNORMAL LOW (ref >60)
Glucose, Bld: 50 mg/dL — ABNORMAL LOW (ref 70–99)
Potassium: 3.4 mmol/L — ABNORMAL LOW (ref 3.5–5.1)
Sodium: 141 mmol/L (ref 135–145)

## 2021-11-03 LAB — CBC WITH DIFFERENTIAL/PLATELET
Abs Immature Granulocytes: 0.04 10*3/uL (ref 0.00–0.07)
Basophils Absolute: 0 10*3/uL (ref 0.0–0.1)
Basophils Relative: 0 %
Eosinophils Absolute: 0 10*3/uL (ref 0.0–0.5)
Eosinophils Relative: 0 %
HCT: 27 % — ABNORMAL LOW (ref 39.0–52.0)
Hemoglobin: 8.7 g/dL — ABNORMAL LOW (ref 13.0–17.0)
Immature Granulocytes: 0 %
Lymphocytes Relative: 14 %
Lymphs Abs: 1.7 10*3/uL (ref 0.7–4.0)
MCH: 27.4 pg (ref 26.0–34.0)
MCHC: 32.2 g/dL (ref 30.0–36.0)
MCV: 84.9 fL (ref 80.0–100.0)
Monocytes Absolute: 1.3 10*3/uL — ABNORMAL HIGH (ref 0.1–1.0)
Monocytes Relative: 11 %
Neutro Abs: 8.7 10*3/uL — ABNORMAL HIGH (ref 1.7–7.7)
Neutrophils Relative %: 75 %
Platelets: 194 10*3/uL (ref 150–400)
RBC: 3.18 MIL/uL — ABNORMAL LOW (ref 4.22–5.81)
RDW: 13.6 % (ref 11.5–15.5)
WBC: 11.8 10*3/uL — ABNORMAL HIGH (ref 4.0–10.5)
nRBC: 0 % (ref 0.0–0.2)

## 2021-11-03 LAB — HEMOGLOBIN A1C
Hgb A1c MFr Bld: 6.9 % — ABNORMAL HIGH (ref 4.8–5.6)
Mean Plasma Glucose: 151.33 mg/dL

## 2021-11-03 MED ORDER — POTASSIUM CHLORIDE CRYS ER 20 MEQ PO TBCR
40.0000 meq | EXTENDED_RELEASE_TABLET | Freq: Once | ORAL | Status: AC
  Administered 2021-11-03: 40 meq via ORAL
  Filled 2021-11-03: qty 2

## 2021-11-03 MED ORDER — IPRATROPIUM-ALBUTEROL 0.5-2.5 (3) MG/3ML IN SOLN: 3.0000 mL | RESPIRATORY_TRACT | Status: DC | PRN

## 2021-11-03 MED ORDER — OXYCODONE HCL 5 MG PO TABS: 5.0000 mg | ORAL_TABLET | ORAL | Status: DC | PRN

## 2021-11-03 MED ORDER — TRAZODONE HCL 50 MG PO TABS: 50.0000 mg | ORAL_TABLET | Freq: Every evening | ORAL | Status: DC | PRN

## 2021-11-03 MED ORDER — METOPROLOL TARTRATE 5 MG/5ML IV SOLN: 5.0000 mg | INTRAVENOUS | Status: DC | PRN

## 2021-11-03 MED ORDER — SENNOSIDES-DOCUSATE SODIUM 8.6-50 MG PO TABS: 1.0000 | ORAL_TABLET | Freq: Every evening | ORAL | Status: DC | PRN

## 2021-11-03 MED ORDER — HYDRALAZINE HCL 20 MG/ML IJ SOLN: 10.0000 mg | INTRAMUSCULAR | Status: DC | PRN

## 2021-11-03 NOTE — Evaluation (Signed)
Physical Therapy Evaluation Patient Details Name: Carlos Gill MRN: [id number] DOB: November 07, 1940 Today's Date: 11/03/2021  History of Present Illness  81 yo male presents to [facility] on 10/1 with R lower flank pain. Workup for UTI sepsis, AKI on CKD, PMH includes  type 2 diabetes mellitus, hypertension, dyslipidemia and OSA on CPAP.  Clinical Impression  Pt presents with R flank pain, otherwise pt at baseline level of strength, balance, and mobility. Pt ambulated great hallway distance without PT assist and increased time given pain only.  PT encouraged pt to continue to mobilize OOB with assist from staff for lines/leads as needed, pt is at baseline level of functioning. PT to sign off.      Recommendations for follow up therapy are one component of a multi-disciplinary discharge planning process, led by the attending physician.  Recommendations may be updated based on patient status, additional functional criteria and insurance authorization.  Follow Up Recommendations No PT follow up      Assistance Recommended at Discharge PRN  Patient can return home with the following       Equipment Recommendations None recommended by PT  Recommendations for Other Services       Functional Status Assessment Patient has not had a recent decline in their functional status     Precautions / Restrictions Precautions Precautions: None Restrictions Weight Bearing Restrictions: No      Mobility  Bed Mobility Overal bed mobility: Modified Independent                  Transfers Overall transfer level: Modified independent                      Ambulation/Gait Ambulation/Gait assistance: Modified independent (Device/Increase time) Gait Distance (Feet): 300 Feet           General Gait Details: increased time vs baseline suspect due to R flank pain, no evidence of imbalance  Stairs            Wheelchair Mobility    Modified Rankin (Stroke Patients Only)        Balance Overall balance assessment: Modified Independent                                           Pertinent Vitals/Pain Pain Assessment Pain Assessment: Faces Faces Pain Scale: Hurts a little bit Pain Location: R flank Pain Descriptors / Indicators: Sore Pain Intervention(s): Monitored during session, Limited activity within patient's tolerance, Repositioned    Home Living Family/patient expects to be discharged to:: Private residence Living Arrangements: Spouse/significant other Available Help at Discharge: Family Type of Home: House Home Access: Stairs to enter   Technical brewer of Steps: 5   Home Layout: One level Home Equipment: Shower seat      Prior Function Prior Level of Function : Independent/Modified Independent             Mobility Comments: pt takes care of his wife, pt is completely indep       Hand Dominance   Dominant Hand: Right    Extremity/Trunk Assessment   Upper Extremity Assessment Upper Extremity Assessment: Defer to OT evaluation    Lower Extremity Assessment Lower Extremity Assessment: Overall WFL for tasks assessed    Cervical / Trunk Assessment Cervical / Trunk Assessment: Normal  Communication   Communication: No difficulties  Cognition Arousal/Alertness: Awake/alert Behavior During Therapy: [facility]  for tasks assessed/performed Overall Cognitive Status: Within Functional Limits for tasks assessed                                          General Comments General comments (skin integrity, edema, etc.): HR 100-128 bpm during session    Exercises     Assessment/Plan    PT Assessment Patient does not need any further PT services  PT Problem List         PT Treatment Interventions      PT Goals (Current goals can be found in the Care Plan section)  Acute Rehab PT Goals Patient Stated Goal: home PT Goal Formulation: With patient Time For Goal Achievement: 11/03/21 Potential  to Achieve Goals: Good    Frequency       Co-evaluation               AM-PAC PT "6 Clicks" Mobility  Outcome Measure Help needed turning from your back to your side while in a flat bed without using bedrails?: None Help needed moving from lying on your back to sitting on the side of a flat bed without using bedrails?: None Help needed moving to and from a bed to a chair (including a wheelchair)?: None Help needed standing up from a chair using your arms (e.g., wheelchair or bedside chair)?: None Help needed to walk in hospital room?: None Help needed climbing 3-5 steps with a railing? : A Little 6 Click Score: 23    End of Session   Activity Tolerance: Patient tolerated treatment well Patient left: with call bell/phone within reach;Other (comment) (OOB to bathroom, can ambulate indep back to bed) Nurse Communication: Mobility status PT Visit Diagnosis: Other abnormalities of gait and mobility (R26.89)    Time: 0938-1829 PT Time Calculation (min) (ACUTE ONLY): 12 min   Charges:   PT Evaluation $PT Eval Low Complexity: 1 Low        Nafisa Olds S, PT DPT Acute Rehabilitation Services Pager [phone]  Office [phone]   Bryce E Stroup 11/03/2021, 11:04 AM

## 2021-11-03 NOTE — Evaluation (Signed)
Occupational Therapy Evaluation Patient Details Name: Carlos Gill MRN: [id number] DOB: November 12, 1940 Today's Date: 11/03/2021   History of Present Illness 81 yo male presents to [facility] on 10/1 with R lower flank pain. Workup for UTI sepsis, AKI on CKD, PMH includes  type 2 diabetes mellitus, hypertension, dyslipidemia and OSA on CPAP.   Clinical Impression   Pt is typically independent in ADL and mobility. 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 Pt today is mod I (increased time to allow for pain management of R flank pain) for UB ADL, LB ADL, standing grooming, self-feeding. Pt with no questions or concerns about being able to perform ADL in home setting, ambulating in room independently pushing his own IV pole. OT to sign off at this time. Encourage OOB as pain allows with RN staff and mobility specialists.       Recommendations for follow up therapy are one component of a multi-disciplinary discharge planning process, led by the attending physician.  Recommendations may be updated based on patient status, additional functional criteria and insurance authorization.   Follow Up Recommendations  No OT follow up    Assistance Recommended at Discharge PRN  Patient can return home with the following      Functional Status Assessment  Patient has not had a recent decline in their functional status  Equipment Recommendations  None recommended by OT    Recommendations for Other Services       Precautions / Restrictions Precautions Precautions: None Restrictions Weight Bearing Restrictions: No      Mobility Bed Mobility Overal bed mobility: Modified Independent             General bed mobility comments: no use of rail    Transfers Overall transfer level: Modified independent Equipment used: None               General transfer comment: increased time due to pain      Balance Overall balance assessment: Modified Independent                                          ADL either performed or assessed with clinical judgement   ADL Overall ADL's : Modified independent                                       General ADL Comments: increased time due to pain in flank, but able to don/doff socks, perform standing grooming at sink, open containers for lunch, no questions or concerns about being able to perform ADL in home setting     Vision Ability to See in Adequate Light: 0 Adequate Patient Visual Report: No change from baseline Vision Assessment?: No apparent visual deficits     Perception     Praxis      Pertinent Vitals/Pain Pain Assessment Pain Assessment: 0-10 Pain Score: 8  Pain Location: R flank Pain Descriptors / Indicators: Sore, Grimacing, Discomfort Pain Intervention(s): Monitored during session     Hand Dominance Right   Extremity/Trunk Assessment Upper Extremity Assessment Upper Extremity Assessment: Overall WFL for tasks assessed   Lower Extremity Assessment Lower Extremity Assessment: Defer to PT evaluation   Cervical / Trunk Assessment Cervical / Trunk Assessment: Normal   Communication Communication Communication: No difficulties   Cognition Arousal/Alertness: Awake/alert Behavior During Therapy: WFL for tasks assessed/performed Overall  Cognitive Status: Within Functional Limits for tasks assessed                                       General Comments  HR 100-128 bpm during session    Exercises     Shoulder Instructions      Home Living Family/patient expects to be discharged to:: Private residence Living Arrangements: Spouse/significant other Available Help at Discharge: Family Type of Home: House Home Access: Stairs to enter Technical brewer of Steps: [place]: One level     Bathroom Shower/Tub: Occupational psychologist: [place]: Shower seat          Prior Functioning/Environment Prior Level of Function :  Independent/Modified Independent             Mobility Comments: pt takes care of his wife, pt is completely indep          OT Problem List: Decreased activity tolerance;Pain      OT Treatment/Interventions:      OT Goals(Current goals can be found in the care plan section) Acute Rehab OT Goals Patient Stated Goal: get home ASAP OT Goal Formulation: With patient Time For Goal Achievement: 11/17/21 Potential to Achieve Goals: Good  OT Frequency:      Co-evaluation              AM-PAC OT "6 Clicks" Daily Activity     Outcome Measure Help from another person eating meals?: None Help from another person taking care of personal grooming?: None Help from another person toileting, which includes using toliet, bedpan, or urinal?: None Help from another person bathing (including washing, rinsing, drying)?: None Help from another person to put on and taking off regular upper body clothing?: None Help from another person to put on and taking off regular lower body clothing?: None 6 Click Score: 24   End of Session Nurse Communication: Mobility status  Activity Tolerance: Patient tolerated treatment well Patient left: in chair;with call bell/phone within reach  OT Visit Diagnosis: Pain Pain - Right/Left: Right Pain - part of body: Hip (flank)                Time: 3545-6256 OT Time Calculation (min): 22 min Charges:  OT General Charges $OT Visit: 1 Visit OT Evaluation $OT Eval Low Complexity: [employer] OTR/L Acute Rehabilitation Services Office: [place] 11/03/2021, 12:24 PM

## 2021-11-03 NOTE — Progress Notes (Addendum)
  Transition of Care [facility]) Screening Note   Patient Details  Name: Carlos Gill Date of Birth: 1941/01/12   Transition of Care [facility]) CM/SW Contact:    Tom-Johnson, Renea Ee, RN Phone Number: 11/03/2021, 4:15 PM  Patient is admitted for Sepsis 2/2 Acute Pyelonephritis,  started on IV Rocephin. CT showed bilateral Pyelonephritis and small right Renal Cyst, 8 mm round lesion in the left kidney. Urology consulted. On IV abx. No PT/OT f/u noted.  Transition of Care Department [facility]) has reviewed patient and no TOC needs or recommendations have been identified at this time. TOC will continue to monitor patient advancement through interdisciplinary progression rounds. If new patient transition needs arise, please place a TOC consult.

## 2021-11-03 NOTE — Progress Notes (Signed)
PROGRESS NOTE    Carlos Gill  TXM:[id number] DOB: 09-Sep-1940 DOA: 11/01/2021 PCP: Burnard Bunting, MD   Brief Narrative:  81 y.o. African-American male with medical history significant for type 2 diabetes mellitus, hypertension, dyslipidemia and OSA on CPAP, who presented to the emergency room with acute onset of right flank pain which has been going on over the last couple of days.  It has been radiating to his right groin.  Patient was diagnosed with sepsis secondary to acute pyelonephritis started on IV Rocephin, urology was consulted.  Renal stone CT showed bilateral pyelonephritis, small right renal cyst, 8 mm round lesion in the left kidney.  There was no evidence of hydronephrosis.   Assessment & Plan:  Principal Problem:   Sepsis due to gram-negative UTI [facility]) Active Problems:   Acute pyelonephritis   Acute kidney injury superimposed on chronic kidney disease (HCC)   Essential hypertension   BPH (benign prostatic hyperplasia)   GERD without esophagitis   Dyslipidemia   Renal mass, right     Assessment and Plan: * Sepsis due to gram-negative UTI ([place]) Acute bilateral pyelonephritis -Initially sepsis protocol initiated.  Prelim urine cultures growing Citrobacter koseri. continue IV fluids, empiric IV Rocephin.  Seen by urology team, no acute interventions planned at this time.  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  Continue IV fluids.  Previous baseline 1.7, today 2.8  Renal mass, right 8 mm - Concerns of hemorrhagic cyst, obtain renal ultrasound there after recommending repeat renal ultrasound in 3 months  Diabetes mellitus type 2 - A1c 6.9.  On Semglee 46 units twice daily.  Sliding scale and Accu-Cheks.  Dyslipidemia - On gemfibrozil  GERD without esophagitis - Daily Protonix  BPH (benign prostatic hyperplasia) - Flomax  Essential hypertension -Lopressor and Norvasc.  IV as needed  Hypokalemia -  Repletion    DVT prophylaxis: enoxaparin (LOVENOX) injection 30 mg Start: 11/02/21 1600 Code Status: Full Family Communication:    Maintain hospital stay to make sure his sepsis physiology continues to improve, remains afebrile and have further culture data including antibiotic sensitivities   Subjective: Seen and examined at bedside, feels a little better this morning after getting IV fluids but still overall feels weak   Examination:  General exam: Appears calm and comfortable  Respiratory system: Clear to auscultation. Respiratory effort normal. Cardiovascular system: S1 & S2 heard, RRR. No JVD, murmurs, rubs, gallops or clicks. No pedal edema. Gastrointestinal system: Abdomen is nondistended, soft and nontender. No organomegaly or masses felt. Normal bowel sounds heard. Central nervous system: Alert and oriented. No focal neurological deficits. Extremities: Symmetric 5 x 5 power. Skin: No rashes, lesions or ulcers Psychiatry: Judgement and insight appear normal. Mood & affect appropriate.     Objective: Vitals:   11/02/21 2226 11/02/21 2247 11/03/21 0112 11/03/21 0548  BP:  (!) 171/77 (!) 109/50 136/80  Pulse:  (!) 101 83 97  Resp:  '16 18 18  '$ Temp: 98.3 F (36.8 C) (!) 101 F (38.3 C) (!) 100.6 F (38.1 C) 98.6 F (37 C)  TempSrc:  Oral Oral Oral  SpO2:  100% 98% 100%  Weight:      Height:        Intake/Output Summary (Last 24 hours) at 11/03/2021 0804 Last data filed at 11/03/2021 0600 Gross per 24 hour  Intake 3304.44 ml  Output 400 ml  Net 2904.44 ml   Filed Weights   11/02/21 0413  Weight: 101 kg  Data Reviewed:   CBC: Recent Labs  Lab 11/01/21 1930 11/02/21 0636 11/03/21 0350  WBC 12.1* 12.4* 11.8*  NEUTROABS 9.3*  --  8.7*  HGB 10.3* 9.4* 8.7*  HCT 31.7* 28.8* 27.0*  MCV 86.4 85.0 84.9  PLT 218 211 381   Basic Metabolic Panel: Recent Labs  Lab 11/01/21 1930 11/02/21 0636 11/03/21 0350  NA 138 140 141  K 4.3 4.2 3.4*  CL 110  112* 112*  CO2 18* 19* 18*  GLUCOSE 122* 152* 50*  BUN 33* 36* 37*  CREATININE 2.98* 2.92* 2.85*  CALCIUM 8.9 8.9 8.6*   GFR: Estimated Creatinine Clearance: 25 mL/min (A) (by C-G formula based on SCr of 2.85 mg/dL (H)). Liver Function Tests: Recent Labs  Lab 11/01/21 1930  AST 16  ALT 11  ALKPHOS 87  BILITOT 0.5  PROT 6.8  ALBUMIN 3.1*   Recent Labs  Lab 11/01/21 1930  LIPASE 25   No results for input(s): "AMMONIA" in the last 168 hours. Coagulation Profile: Recent Labs  Lab 11/02/21 0636  INR 1.4*   Cardiac Enzymes: No results for input(s): "CKTOTAL", "CKMB", "CKMBINDEX", "TROPONINI" in the last 168 hours. BNP (last 3 results) No results for input(s): "PROBNP" in the last 8760 hours. HbA1C: Recent Labs    11/03/21 0350  HGBA1C 6.9*   CBG: Recent Labs  Lab 11/02/21 1758 11/02/21 2143 11/02/21 2257 11/03/21 0538 11/03/21 0619  GLUCAP 111* 158* 139* 46* 106*   Lipid Profile: No results for input(s): "CHOL", "HDL", "LDLCALC", "TRIG", "CHOLHDL", "LDLDIRECT" in the last 72 hours. Thyroid Function Tests: No results for input(s): "TSH", "T4TOTAL", "FREET4", "T3FREE", "THYROIDAB" in the last 72 hours. Anemia Panel: No results for input(s): "VITAMINB12", "FOLATE", "FERRITIN", "TIBC", "IRON", "RETICCTPCT" in the last 72 hours. Sepsis Labs: Recent Labs  Lab 11/02/21 0636  PROCALCITON 0.61    Recent Results (from the past 240 hour(s))  Blood culture (routine x 2)     Status: None (Preliminary result)   Collection Time: 11/02/21  4:31 AM   Specimen: BLOOD  Result Value Ref Range Status   Specimen Description BLOOD SITE NOT SPECIFIED  Final   Special Requests AEROBIC BOTTLE ONLY Blood Culture adequate volume  Final   Culture   Final    NO GROWTH 1 DAY Performed at [facility], [address], [place], [place] [address]    Report Status PENDING  Incomplete  Blood culture (routine x 2)     Status: None (Preliminary result)   Collection Time:  11/02/21  6:36 AM   Specimen: BLOOD RIGHT FOREARM  Result Value Ref Range Status   Specimen Description BLOOD RIGHT FOREARM  Final   Special Requests   Final    BOTTLES DRAWN AEROBIC AND ANAEROBIC Blood Culture adequate volume   Culture   Final    NO GROWTH 1 DAY Performed at [facility], [place] [address] [place], [place] [address]    Report Status PENDING  Incomplete         Radiology Studies: CT Renal Stone Study  Result Date: 11/01/2021 CLINICAL DATA:  Right lower quadrant pain. EXAM: CT ABDOMEN AND PELVIS WITHOUT CONTRAST TECHNIQUE: Multidetector CT imaging of the abdomen and pelvis was performed following the standard protocol without IV contrast. RADIATION DOSE REDUCTION: This exam was performed according to the departmental dose-optimization program which includes automated exposure control, adjustment of the mA and/or kV according to patient size and/or use of iterative reconstruction technique. COMPARISON:  None Available. FINDINGS: Lower chest:  No acute abnormality. Hepatobiliary: No focal liver abnormality is seen. No gallstones, gallbladder wall thickening, or biliary dilatation. Pancreas: Unremarkable. No pancreatic ductal dilatation or surrounding inflammatory changes. Spleen: Normal in size without focal abnormality. Adrenals/Urinary Tract: Adrenal glands are unremarkable. Kidneys are normal in size, without renal calculi or hydronephrosis. A 1.5 cm diameter cyst is seen within the anterior aspect of the mid right kidney. An 8 mm diameter round, isodense lesion is seen along the anterior aspect of the lower pole of the left kidney. There is bilateral, moderate severity nonspecific perinephric inflammatory fat stranding. The urinary bladder is poorly distended and subsequently limited in evaluation. Mild diffuse urinary bladder wall thickening is noted. Stomach/Bowel: Stomach is within normal limits. Appendix appears normal. No evidence of bowel wall thickening, distention, or  inflammatory changes. Noninflamed diverticula are seen throughout the large bowel. Vascular/Lymphatic: No significant vascular findings are present. No enlarged abdominal or pelvic lymph nodes. Reproductive: The prostate gland is not clearly identified. Other: No abdominal wall hernia or abnormality. No abdominopelvic ascites. Musculoskeletal: Multilevel degenerative changes seen throughout the lumbar spine. IMPRESSION: 1. Colonic diverticulosis. 2. Bilateral, moderate severity nonspecific perinephric inflammatory fat stranding, which may represent associated with acute pyelonephritis. Correlation with urinalysis is recommended. 3. Small right renal cyst. No additional follow-up imaging is recommended. This recommendation follows ACR consensus guidelines: Management of the Incidental Renal Mass on CT: A White Paper of the ACR Incidental Findings Committee. J Am Coll Radiol 2018;15:264-273. 4. 8 mm diameter round, isodense lesion along the anterior aspect of the lower pole of the left kidney. While this may represent a small hemorrhagic cyst, further evaluation with renal ultrasound is recommended. This recommendation follows ACR consensus guidelines: Management of the Incidental Renal Mass on CT: A White Paper of the ACR Incidental Findings Committee. J Am Coll Radiol [phone]. Electronically Signed   By: Virgina Norfolk M.D.   On: 11/01/2021 20:12        Scheduled Meds:  amLODipine  10 mg Oral Daily   aspirin EC  81 mg Oral q morning   enoxaparin (LOVENOX) injection  30 mg Subcutaneous Q24H   gemfibrozil  600 mg Oral BID   insulin aspart  0-15 Units Subcutaneous TID WC   insulin glargine-yfgn  46 Units Subcutaneous BID   metoprolol tartrate  75 mg Oral BID   multivitamin  1 tablet Oral BID   multivitamin with minerals  1 tablet Oral Daily   pantoprazole  40 mg Oral Daily   polyvinyl alcohol  1 drop Both Eyes Daily   tamsulosin  0.4 mg Oral BID   Continuous Infusions:  sodium chloride      sodium chloride 100 mL/hr at 11/02/21 1705   cefTRIAXone (ROCEPHIN)  IV 2 g (11/03/21 0545)     LOS: 1 day   Time spent= 35 mins    Deloris Mittag Arsenio Loader, MD [facility]  If 7PM-7AM, please contact night-coverage  11/03/2021, 8:04 AM

## 2021-11-04 DIAGNOSIS — A4150 Gram-negative sepsis, unspecified: Secondary | ICD-10-CM | POA: Diagnosis not present

## 2021-11-04 DIAGNOSIS — N390 Urinary tract infection, site not specified: Secondary | ICD-10-CM | POA: Diagnosis not present

## 2021-11-04 LAB — BASIC METABOLIC PANEL
Anion gap: 10 (ref 5–15)
BUN: 32 mg/dL — ABNORMAL HIGH (ref 8–23)
CO2: 18 mmol/L — ABNORMAL LOW (ref 22–32)
Calcium: 8.9 mg/dL (ref 8.9–10.3)
Chloride: 115 mmol/L — ABNORMAL HIGH (ref 98–111)
Creatinine, Ser: 2.78 mg/dL — ABNORMAL HIGH (ref 0.61–1.24)
GFR, Estimated: 22 mL/min — ABNORMAL LOW (ref >60)
Glucose, Bld: 58 mg/dL — ABNORMAL LOW (ref 70–99)
Potassium: 3.9 mmol/L (ref 3.5–5.1)
Sodium: 143 mmol/L (ref 135–145)

## 2021-11-04 LAB — URINE CULTURE
Culture: >=100000 — AB
Special Requests: NORMAL

## 2021-11-04 LAB — CBC
HCT: 24.8 % — ABNORMAL LOW (ref 39.0–52.0)
Hemoglobin: 8.1 g/dL — ABNORMAL LOW (ref 13.0–17.0)
MCH: 27.9 pg (ref 26.0–34.0)
MCHC: 32.7 g/dL (ref 30.0–36.0)
MCV: 85.5 fL (ref 80.0–100.0)
Platelets: 213 10*3/uL (ref 150–400)
RBC: 2.9 MIL/uL — ABNORMAL LOW (ref 4.22–5.81)
RDW: 13.5 % (ref 11.5–15.5)
WBC: 8.4 10*3/uL (ref 4.0–10.5)
nRBC: 0 % (ref 0.0–0.2)

## 2021-11-04 LAB — MAGNESIUM: Magnesium: 2 mg/dL (ref 1.7–2.4)

## 2021-11-04 LAB — GLUCOSE, CAPILLARY
Glucose-Capillary: 46 mg/dL — ABNORMAL LOW (ref 70–99)
Glucose-Capillary: 117 mg/dL — ABNORMAL HIGH (ref 70–99)
Glucose-Capillary: 111 mg/dL — ABNORMAL HIGH (ref 70–99)

## 2021-11-04 MED ORDER — CIPROFLOXACIN HCL 500 MG PO TABS: 500.0000 mg | ORAL_TABLET | Freq: Two times a day (BID) | ORAL | 0 refills | Status: AC

## 2021-11-04 NOTE — Progress Notes (Signed)
DISCHARGE NOTE HOME CIAN COSTANZO to be discharged Home per MD order. Discussed prescriptions and follow up appointments with the patient. Prescriptions given to patient; medication list explained in detail. Patient verbalized understanding.  Skin clean, dry and intact without evidence of skin break down, no evidence of skin tears noted. IV catheter discontinued intact. Site without signs and symptoms of complications. Dressing and pressure applied. Pt denies pain at the site currently. No complaints noted.  Patient free of lines, drains, and wounds.   An After Visit Summary (AVS) was printed and given to the patient. Patient escorted via wheelchair, and discharged home via private auto.  Anastasio Auerbach, RN

## 2021-11-04 NOTE — Plan of Care (Signed)
  Problem: Education: Goal: Knowledge of General Education information will improve Description: Including pain rating scale, medication(s)/side effects and non-pharmacologic comfort measures Outcome: Completed/Met   Problem: Health Behavior/Discharge Planning: Goal: Ability to manage health-related needs will improve Outcome: Completed/Met   Problem: Clinical Measurements: Goal: Ability to maintain clinical measurements within normal limits will improve Outcome: Completed/Met Goal: Will remain free from infection Outcome: Completed/Met Goal: Diagnostic test results will improve Outcome: Completed/Met Goal: Respiratory complications will improve Outcome: Completed/Met Goal: Cardiovascular complication will be avoided Outcome: Completed/Met   Problem: Activity: Goal: Risk for activity intolerance will decrease Outcome: Completed/Met   Problem: Nutrition: Goal: Adequate nutrition will be maintained Outcome: Completed/Met   Problem: Coping: Goal: Level of anxiety will decrease Outcome: Completed/Met   Problem: Elimination: Goal: Will not experience complications related to bowel motility Outcome: Completed/Met Goal: Will not experience complications related to urinary retention Outcome: Completed/Met   Problem: Pain Managment: Goal: General experience of comfort will improve Outcome: Completed/Met   Problem: Safety: Goal: Ability to remain free from injury will improve Outcome: Completed/Met   Problem: Skin Integrity: Goal: Risk for impaired skin integrity will decrease Outcome: Completed/Met   Problem: Fluid Volume: Goal: Hemodynamic stability will improve Outcome: Completed/Met   Problem: Clinical Measurements: Goal: Diagnostic test results will improve Outcome: Completed/Met Goal: Signs and symptoms of infection will decrease Outcome: Completed/Met   Problem: Respiratory: Goal: Ability to maintain adequate ventilation will improve Outcome:  Completed/Met   Problem: Education: Goal: Ability to describe self-care measures that may prevent or decrease complications (Diabetes Survival Skills Education) will improve Outcome: Completed/Met Goal: Individualized Educational Video(s) Outcome: Completed/Met   Problem: Coping: Goal: Ability to adjust to condition or change in health will improve Outcome: Completed/Met   Problem: Fluid Volume: Goal: Ability to maintain a balanced intake and output will improve Outcome: Completed/Met   Problem: Health Behavior/Discharge Planning: Goal: Ability to identify and utilize available resources and services will improve Outcome: Completed/Met Goal: Ability to manage health-related needs will improve Outcome: Completed/Met   Problem: Metabolic: Goal: Ability to maintain appropriate glucose levels will improve Outcome: Completed/Met   Problem: Nutritional: Goal: Maintenance of adequate nutrition will improve Outcome: Completed/Met Goal: Progress toward achieving an optimal weight will improve Outcome: Completed/Met   Problem: Skin Integrity: Goal: Risk for impaired skin integrity will decrease Outcome: Completed/Met   Problem: Tissue Perfusion: Goal: Adequacy of tissue perfusion will improve Outcome: Completed/Met   Problem: Education: Goal: Knowledge of General Education information will improve Description: Including pain rating scale, medication(s)/side effects and non-pharmacologic comfort measures Outcome: Completed/Met   Problem: Clinical Measurements: Goal: Ability to maintain clinical measurements within normal limits will improve Outcome: Completed/Met Goal: Will remain free from infection Outcome: Completed/Met Goal: Diagnostic test results will improve Outcome: Completed/Met   Problem: Coping: Goal: Level of anxiety will decrease Outcome: Completed/Met   Problem: Elimination: Goal: Will not experience complications related to bowel motility Outcome:  Completed/Met Goal: Will not experience complications related to urinary retention Outcome: Completed/Met   Problem: Pain Managment: Goal: General experience of comfort will improve Outcome: Completed/Met   Problem: Safety: Goal: Ability to remain free from injury will improve Outcome: Completed/Met

## 2021-11-04 NOTE — Discharge Summary (Signed)
Physician Discharge Summary  Carlos Gill PYP:[id number] DOB: March 15, 1940 DOA: 11/01/2021  PCP: Burnard Bunting, MD  Admit date: 11/01/2021 Discharge date: 11/04/2021  Admitted From: Home Disposition:  Home  Recommendations for Outpatient Follow-up:  Follow up with PCP in 1-2 weeks Please obtain BMP/CBC in one week your next doctors visit.  PO Cipro for 7 days Oral hydration advised.    Discharge Condition: Stable CODE STATUS: Full Diet recommendation:   Brief/Interim Summary: 81 y.o. African-American male with medical history significant for type 2 diabetes mellitus, hypertension, dyslipidemia and OSA on CPAP, who presented to the emergency room with acute onset of right flank pain which has been going on over the last couple of days.  It has been radiating to his right groin.  Patient was diagnosed with sepsis secondary to acute pyelonephritis started on IV Rocephin, urology was consulted.  Renal stone CT showed bilateral pyelonephritis, small right renal cyst, 8 mm round lesion in the left kidney.  There was no evidence of hydronephrosis.  Eventually cultures grew Citrobacter which was pansensitive, due to pyelonephritis I opted to discharge patient on 7 days of oral Cipro.  Advised oral hydration.  Advise repeat BMP to ensure renal function continues to improve in about 1 week.     Assessment & Plan:  Principal Problem:   Sepsis due to gram-negative UTI [facility]) Active Problems:   Acute pyelonephritis   Acute kidney injury superimposed on chronic kidney disease (HCC)   Essential hypertension   BPH (benign prostatic hyperplasia)   GERD without esophagitis   Dyslipidemia   Renal mass, right       Assessment and Plan: * Sepsis due to gram-negative UTI ([place]) Acute bilateral pyelonephritis - Sepsis physiology has resolved.  Urine cultures are growing Citrobacter koseri which is pansensitive.  We will transition patient to oral Cipro for 7 more days to finish the antibiotic course.   He will need to follow-up outpatient with PCP next 1-2 weeks.  During this time I would still advise oral hydration at home.  Acute kidney injury superimposed on chronic kidney disease ([place]) 3b - Likely from underlying UTI.  Continue IV fluids.  Previous baseline 1.7, Crea trending downwards, today 2.78.  Advised to continue oral hydration, repeat blood work with PCP in about 1 week.   Renal mass, right 8 mm - Concerns of hemorrhagic cyst, obtain renal ultrasound there after recommending repeat renal ultrasound in 3 months   Diabetes mellitus type 2 - A1c 6.9.  Had 1 episode of asymptomatic hypoglycemia this morning but after eating his breakfast his blood glucose stabilized.  Patient tells me he does not necessarily follow a strict diet at home therefore his insulin regimen is slightly higher.  He does not want any further changes in insulin regimen for now.   Dyslipidemia - On gemfibrozil   GERD without esophagitis - Daily Protonix   BPH (benign prostatic hyperplasia) - Flomax   Essential hypertension -Lopressor and Norvasc.    Hypokalemia - Repletion      Discharge Diagnoses:  Principal Problem:   Sepsis due to gram-negative UTI ([place]) Active Problems:   Acute pyelonephritis   Acute kidney injury superimposed on chronic kidney disease (HCC)   Essential hypertension   BPH (benign prostatic hyperplasia)   GERD without esophagitis   Dyslipidemia   Renal mass, right      Consultations: None  Subjective: Feels great wishes to go home.  Ambulating without any issues.  Discharge Exam: Vitals:   11/03/21 2025 11/04/21 1036  BP: (!) 136/58 (!) 162/77  Pulse: 88   Resp: 16 16  Temp: 98.9 F (37.2 C) 98 F (36.7 C)  SpO2: 100% 100%   Vitals:   11/03/21 0900 11/03/21 1652 11/03/21 2025 11/04/21 1036  BP: (!) 140/86 (!) 160/83 (!) 136/58 (!) 162/77  Pulse: 60 89 88   Resp: '19  16 16  '$ Temp: 98.2 F (36.8 C) 98.7 F (37.1 C) 98.9 F (37.2 C) 98 F (36.7 C)   TempSrc: Oral Oral  Oral  SpO2:  100% 100% 100%  Weight:      Height:        General: Pt is alert, awake, not in acute distress Cardiovascular: RRR, S1/S2 +, no rubs, no gallops Respiratory: CTA bilaterally, no wheezing, no rhonchi Abdominal: Soft, NT, ND, bowel sounds + Extremities: no edema, no cyanosis  Discharge Instructions   Allergies as of 11/04/2021       Reactions   Statins Other (See Comments)   Muscle aches        Medication List     TAKE these medications    acetaminophen 500 MG tablet Commonly known as: TYLENOL Take 1,000 mg by mouth daily as needed for moderate pain.   amLODipine 2.5 MG tablet Commonly known as: NORVASC Take 10 mg by mouth daily.   aspirin EC 81 MG tablet Take 81 mg by mouth every morning.   augmented betamethasone dipropionate 0.05 % cream Commonly known as: DIPROLENE-AF Apply 1 Application topically 2 (two) times daily.   benzonatate 100 MG capsule Commonly known as: Tessalon Perles Take 1 capsule (100 mg total) by mouth 3 (three) times daily as needed for cough.   calcitRIOL 0.25 MCG capsule Commonly known as: ROCALTROL Take 0.25 mcg by mouth daily.   ciprofloxacin 500 MG tablet Commonly known as: Cipro Take 1 tablet (500 mg total) by mouth 2 (two) times daily for 7 days.   ezetimibe 10 MG tablet Commonly known as: ZETIA Take 10 mg by mouth daily.   ferrous sulfate 325 (65 FE) MG tablet Take 325 mg by mouth every Monday, Wednesday, and Friday.   furosemide 20 MG tablet Commonly known as: LASIX Take 20-40 mg by mouth See admin instructions. Take 2 tablets (40 mg) in the morning and 1 tablet (20 mg) at night.   gemfibrozil 600 MG tablet Commonly known as: LOPID Take 600 mg by mouth 2 (two) times daily.   insulin aspart 100 UNIT/ML injection Commonly known as: novoLOG Inject 6-18 Units into the skin 3 (three) times daily with meals. Per sliding scale   insulin glargine 100 unit/mL Sopn Commonly known as:  LANTUS Inject 50 Units into the skin 2 (two) times daily.   losartan 100 MG tablet Commonly known as: COZAAR Take 50 mg by mouth 2 (two) times daily.   metoprolol tartrate 50 MG tablet Commonly known as: LOPRESSOR Take 50 mg by mouth 2 (two) times daily.   multivitamin with minerals Tabs tablet Take 1 tablet by mouth daily.   omeprazole 20 MG capsule Commonly known as: PRILOSEC Take 20 mg by mouth daily.   Ozempic (0.25 or 0.5 MG/DOSE) 2 MG/3ML Sopn Generic drug: Semaglutide(0.25 or 0.'5MG'$ /DOS) Inject 0.5 mg into the skin once a week. Monday   polyvinyl alcohol 1.4 % ophthalmic solution Commonly known as: LIQUIFILM TEARS Place 1 drop into both eyes daily.   potassium chloride SA 20 MEQ tablet Commonly known as: KLOR-CON M Take 20 mEq by mouth daily as needed (for cramping).   PreserVision  AREDS 2 Caps Take 1 capsule by mouth 2 (two) times daily.   tamsulosin 0.4 MG Caps capsule Commonly known as: FLOMAX Take 0.4 mg by mouth daily. Takes 2 tablets daily   trospium 20 MG tablet Commonly known as: SANCTURA Take 20 mg by mouth 2 (two) times daily.   Vitamin D3 25 MCG (1000 UT) Caps Take 1,000 Units by mouth daily.        Follow-up Information     Burnard Bunting, MD. Call in 1 week(s).   Specialty: Internal Medicine Contact information: [place] [address] [phone]                Allergies  Allergen Reactions   Statins Other (See Comments)    Muscle aches    You were cared for by a hospitalist during your hospital stay. If you have any questions about your discharge medications or the care you received while you were in the hospital after you are discharged, you can call the unit and asked to speak with the hospitalist on call if the hospitalist that took care of you is not available. Once you are discharged, your primary care physician will handle any further medical issues. Please note that no refills for any discharge  medications will be authorized once you are discharged, as it is imperative that you return to your primary care physician (or establish a relationship with a primary care physician if you do not have one) for your aftercare needs so that they can reassess your need for medications and monitor your lab values.   Procedures/Studies: US RENAL  Result Date: 11/03/2021 CLINICAL DATA:  Renal cyst EXAM: RENAL / URINARY TRACT ULTRASOUND COMPLETE COMPARISON:  CT done on 11/01/2021 FINDINGS: Right Kidney: Renal measurements: 10.4 x 6.6 x 6.1 cm = volume: 217.6 mL. There is no hydronephrosis. Cortical echogenicity is unremarkable. There is 1.8 cm cyst in the midportion of right kidney. Left Kidney: Renal measurements: 9.9 x 6 x 5.7 cm = volume: 177.6 mL. There is no hydronephrosis. Cortical echogenicity is unremarkable. 8 mm isodense lesion in the lower pole of left kidney seen in the previous CT is not sonographically visualized and not evaluated. Bladder: Appears normal for degree of bladder distention. Other: None. IMPRESSION: There is no hydronephrosis.  There is 1.8 cm right renal cyst. 8 mm isodense lesion in the lower pole of left kidney seen in the previous CT examination is not sonographically visualized and could not be evaluated. Follow-up multiphasic CT as clinically warranted should be considered. Electronically Signed   By: Elmer Picker M.D.   On: 11/03/2021 09:26   CT Renal Stone Study  Result Date: 11/01/2021 CLINICAL DATA:  Right lower quadrant pain. EXAM: CT ABDOMEN AND PELVIS WITHOUT CONTRAST TECHNIQUE: Multidetector CT imaging of the abdomen and pelvis was performed following the standard protocol without IV contrast. RADIATION DOSE REDUCTION: This exam was performed according to the departmental dose-optimization program which includes automated exposure control, adjustment of the mA and/or kV according to patient size and/or use of iterative reconstruction technique. COMPARISON:  None  Available. FINDINGS: Lower chest: No acute abnormality. Hepatobiliary: No focal liver abnormality is seen. No gallstones, gallbladder wall thickening, or biliary dilatation. Pancreas: Unremarkable. No pancreatic ductal dilatation or surrounding inflammatory changes. Spleen: Normal in size without focal abnormality. Adrenals/Urinary Tract: Adrenal glands are unremarkable. Kidneys are normal in size, without renal calculi or hydronephrosis. A 1.5 cm diameter cyst is seen within the anterior aspect of the mid right kidney. An  8 mm diameter round, isodense lesion is seen along the anterior aspect of the lower pole of the left kidney. There is bilateral, moderate severity nonspecific perinephric inflammatory fat stranding. The urinary bladder is poorly distended and subsequently limited in evaluation. Mild diffuse urinary bladder wall thickening is noted. Stomach/Bowel: Stomach is within normal limits. Appendix appears normal. No evidence of bowel wall thickening, distention, or inflammatory changes. Noninflamed diverticula are seen throughout the large bowel. Vascular/Lymphatic: No significant vascular findings are present. No enlarged abdominal or pelvic lymph nodes. Reproductive: The prostate gland is not clearly identified. Other: No abdominal wall hernia or abnormality. No abdominopelvic ascites. Musculoskeletal: Multilevel degenerative changes seen throughout the lumbar spine. IMPRESSION: 1. Colonic diverticulosis. 2. Bilateral, moderate severity nonspecific perinephric inflammatory fat stranding, which may represent associated with acute pyelonephritis. Correlation with urinalysis is recommended. 3. Small right renal cyst. No additional follow-up imaging is recommended. This recommendation follows ACR consensus guidelines: Management of the Incidental Renal Mass on CT: A White Paper of the ACR Incidental Findings Committee. J Am Coll Radiol 2018;15:264-273. 4. 8 mm diameter round, isodense lesion along the  anterior aspect of the lower pole of the left kidney. While this may represent a small hemorrhagic cyst, further evaluation with renal ultrasound is recommended. This recommendation follows ACR consensus guidelines: Management of the Incidental Renal Mass on CT: A White Paper of the ACR Incidental Findings Committee. J Am Coll Radiol [phone]. Electronically Signed   By: Virgina Norfolk M.D.   On: 11/01/2021 20:12     The results of significant diagnostics from this hospitalization (including imaging, microbiology, ancillary and laboratory) are listed below for reference.     Microbiology: Recent Results (from the past 240 hour(s))  Blood culture (routine x 2)     Status: None (Preliminary result)   Collection Time: 11/02/21  4:31 AM   Specimen: BLOOD  Result Value Ref Range Status   Specimen Description BLOOD SITE NOT SPECIFIED  Final   Special Requests AEROBIC BOTTLE ONLY Blood Culture adequate volume  Final   Culture   Final    NO GROWTH 2 DAYS Performed at [facility], [address], [place], [place] [address]    Report Status PENDING  Incomplete  Urine Culture     Status: Abnormal   Collection Time: 11/02/21  6:28 AM   Specimen: Urine, Clean Catch  Result Value Ref Range Status   Specimen Description URINE, CLEAN CATCH  Final   Special Requests   Final    Normal Performed at [facility], [place] [address] [place], [place] [address]    Culture >=100,000 COLONIES/mL CITROBACTER KOSERI (A)  Final   Report Status 11/04/2021 FINAL  Final   Organism ID, Bacteria CITROBACTER KOSERI (A)  Final      Susceptibility   Citrobacter koseri - MIC*    CEFAZOLIN <=4 SENSITIVE Sensitive     CEFEPIME <=0.12 SENSITIVE Sensitive     CEFTRIAXONE <=0.25 SENSITIVE Sensitive     CIPROFLOXACIN <=0.25 SENSITIVE Sensitive     GENTAMICIN <=1 SENSITIVE Sensitive     IMIPENEM <=0.25 SENSITIVE Sensitive     NITROFURANTOIN 32 SENSITIVE Sensitive     TRIMETH/SULFA <=20 SENSITIVE  Sensitive     PIP/TAZO <=4 SENSITIVE Sensitive     * >=100,000 COLONIES/mL CITROBACTER KOSERI  Blood culture (routine x 2)     Status: None (Preliminary result)   Collection Time: 11/02/21  6:36 AM   Specimen: BLOOD RIGHT FOREARM  Result Value Ref Range Status  Specimen Description BLOOD RIGHT FOREARM  Final   Special Requests   Final    BOTTLES DRAWN AEROBIC AND ANAEROBIC Blood Culture adequate volume   Culture   Final    NO GROWTH 2 DAYS Performed at [facility], [address] [place], [place] [address]    Report Status PENDING  Incomplete     Labs: BNP (last 3 results) No results for input(s): "BNP" in the last 8760 hours. Basic Metabolic Panel: Recent Labs  Lab 11/01/21 1930 11/02/21 0636 11/03/21 0350 11/04/21 0420  NA 138 140 141 143  K 4.3 4.2 3.4* 3.9  CL 110 112* 112* 115*  CO2 18* 19* 18* 18*  GLUCOSE 122* 152* 50* 58*  BUN 33* 36* 37* 32*  CREATININE 2.98* 2.92* 2.85* 2.78*  CALCIUM 8.9 8.9 8.6* 8.9  MG  --   --   --  2.0   Liver Function Tests: Recent Labs  Lab 11/01/21 1930  AST 16  ALT 11  ALKPHOS 87  BILITOT 0.5  PROT 6.8  ALBUMIN 3.1*   Recent Labs  Lab 11/01/21 1930  LIPASE 25   No results for input(s): "AMMONIA" in the last 168 hours. CBC: Recent Labs  Lab 11/01/21 1930 11/02/21 0636 11/03/21 0350 11/04/21 0420  WBC 12.1* 12.4* 11.8* 8.4  NEUTROABS 9.3*  --  8.7*  --   HGB 10.3* 9.4* 8.7* 8.1*  HCT 31.7* 28.8* 27.0* 24.8*  MCV 86.4 85.0 84.9 85.5  PLT 218 211 194 213   Cardiac Enzymes: No results for input(s): "CKTOTAL", "CKMB", "CKMBINDEX", "TROPONINI" in the last 168 hours. BNP: Invalid input(s): "POCBNP" CBG: Recent Labs  Lab 11/03/21 1652 11/03/21 2022 11/04/21 0449 11/04/21 0547 11/04/21 0751  GLUCAP 158* 181* 46* 117* 111*   D-Dimer No results for input(s): "DDIMER" in the last 72 hours. Hgb A1c Recent Labs    11/03/21 0350  HGBA1C 6.9*   Lipid Profile No results for input(s): "CHOL", "HDL",  "LDLCALC", "TRIG", "CHOLHDL", "LDLDIRECT" in the last 72 hours. Thyroid function studies No results for input(s): "TSH", "T4TOTAL", "T3FREE", "THYROIDAB" in the last 72 hours.  Invalid input(s): "FREET3" Anemia work up No results for input(s): "VITAMINB12", "FOLATE", "FERRITIN", "TIBC", "IRON", "RETICCTPCT" in the last 72 hours. Urinalysis    Component Value Date/Time   COLORURINE YELLOW 11/01/2021 1924   APPEARANCEUR CLOUDY (A) 11/01/2021 1924   LABSPEC 1.013 11/01/2021 1924   PHURINE 5.0 11/01/2021 1924   GLUCOSEU NEGATIVE 11/01/2021 1924   HGBUR SMALL (A) 11/01/2021 1924   BILIRUBINUR NEGATIVE 11/01/2021 1924   KETONESUR NEGATIVE 11/01/2021 1924   PROTEINUR >=300 (A) 11/01/2021 1924   UROBILINOGEN 0.2 05/19/2014 1847   NITRITE NEGATIVE 11/01/2021 1924   LEUKOCYTESUR LARGE (A) 11/01/2021 1924   Sepsis Labs Recent Labs  Lab 11/01/21 1930 11/02/21 0636 11/03/21 0350 11/04/21 0420  WBC 12.1* 12.4* 11.8* 8.4   Microbiology Recent Results (from the past 240 hour(s))  Blood culture (routine x 2)     Status: None (Preliminary result)   Collection Time: 11/02/21  4:31 AM   Specimen: BLOOD  Result Value Ref Range Status   Specimen Description BLOOD SITE NOT SPECIFIED  Final   Special Requests AEROBIC BOTTLE ONLY Blood Culture adequate volume  Final   Culture   Final    NO GROWTH 2 DAYS Performed at [facility], [address], [place], [place] [address]    Report Status PENDING  Incomplete  Urine Culture     Status: Abnormal  Collection Time: 11/02/21  6:28 AM   Specimen: Urine, Clean Catch  Result Value Ref Range Status   Specimen Description URINE, CLEAN CATCH  Final   Special Requests   Final    Normal Performed at [facility], [address], [place], [place] [address]    Culture >=100,000 COLONIES/mL CITROBACTER KOSERI (A)  Final   Report Status 11/04/2021 FINAL  Final   Organism ID, Bacteria CITROBACTER KOSERI (A)  Final      Susceptibility    Citrobacter koseri - MIC*    CEFAZOLIN <=4 SENSITIVE Sensitive     CEFEPIME <=0.12 SENSITIVE Sensitive     CEFTRIAXONE <=0.25 SENSITIVE Sensitive     CIPROFLOXACIN <=0.25 SENSITIVE Sensitive     GENTAMICIN <=1 SENSITIVE Sensitive     IMIPENEM <=0.25 SENSITIVE Sensitive     NITROFURANTOIN 32 SENSITIVE Sensitive     TRIMETH/SULFA <=20 SENSITIVE Sensitive     PIP/TAZO <=4 SENSITIVE Sensitive     * >=100,000 COLONIES/mL CITROBACTER KOSERI  Blood culture (routine x 2)     Status: None (Preliminary result)   Collection Time: 11/02/21  6:36 AM   Specimen: BLOOD RIGHT FOREARM  Result Value Ref Range Status   Specimen Description BLOOD RIGHT FOREARM  Final   Special Requests   Final    BOTTLES DRAWN AEROBIC AND ANAEROBIC Blood Culture adequate volume   Culture   Final    NO GROWTH 2 DAYS Performed at [facility], [address] [place], [place] [address]    Report Status PENDING  Incomplete     Time coordinating discharge:  I have spent 35 minutes face to face with the patient and on the ward discussing the patients care, assessment, plan and disposition with other care givers. >50% of the time was devoted counseling the patient about the risks and benefits of treatment/Discharge disposition and coordinating care.   SIGNED:   Damita Lack, MD  [facility] 11/04/2021, 11:54 AM   If 7PM-7AM, please contact night-coverage

## 2021-11-04 NOTE — Progress Notes (Signed)
Mobility Specialist Progress Note:   11/04/21 0933  Mobility  Activity Ambulated independently in hallway  Activity Response Tolerated well  Distance Ambulated (ft) 500 ft  $Mobility charge 1 Mobility  Level of Assistance Modified independent, requires aide device or extra time  Assistive Device Other (Comment) (IV Pole)  Mobility Referral Yes   Pt received sitting EOB and agreeable. No complaints. Pt left sitting EOB with all needs met and call bell in reach.   Shaine Mount Mobility Specialist-Acute Rehab Secure Chat only

## 2021-11-04 NOTE — TOC Transition Note (Signed)
Transition of Care [facility]) - CM/SW Discharge Note   Patient Details  Name: Carlos Gill MRN: [id number] Date of Birth: 1940-05-01  Transition of Care [facility]) CM/SW Contact:  Tom-Johnson, Renea Ee, RN Phone Number: 11/04/2021, 11:24 AM   Clinical Narrative:     Patient is scheduled for discharge today. No TOC needs or recommendations noted. Family to transport at discharge. No further TOC needs noted.    Final next level of care: Home/Self Care Barriers to Discharge: Barriers Resolved   Patient Goals and CMS Choice Patient states their goals for this hospitalization and ongoing recovery are:: To return home CMS Medicare.gov Compare Post Acute Care list provided to:: Patient Choice offered to / list presented to : NA  Discharge Placement                Patient to be transferred to facility by: Family      Discharge Plan and Services                DME Arranged: N/A DME Agency: NA       HH Arranged: NA HH Agency: NA        Social Determinants of Health (SDOH) Interventions     Readmission Risk Interventions     No data to display

## 2021-11-07 LAB — CULTURE, BLOOD (ROUTINE X 2)
Culture: NO GROWTH
Culture: NO GROWTH
Special Requests: ADEQUATE
Special Requests: ADEQUATE

## 2021-11-09 DIAGNOSIS — E1165 Type 2 diabetes mellitus with hyperglycemia: Secondary | ICD-10-CM | POA: Insufficient documentation

## 2021-11-09 DIAGNOSIS — N184 Chronic kidney disease, stage 4 (severe): Secondary | ICD-10-CM | POA: Insufficient documentation

## 2021-11-09 DIAGNOSIS — N451 Epididymitis: Secondary | ICD-10-CM

## 2021-11-09 HISTORY — DX: Chronic kidney disease, stage 4 (severe): N18.4

## 2021-11-09 HISTORY — DX: Type 2 diabetes mellitus with hyperglycemia: E11.65

## 2021-11-09 HISTORY — DX: Epididymitis: N45.1

## 2022-05-03 ENCOUNTER — Encounter (HOSPITAL_COMMUNITY): Payer: Self-pay | Admitting: Emergency Medicine

## 2022-05-03 ENCOUNTER — Emergency Department (HOSPITAL_COMMUNITY)
Admission: EM | Admit: 2022-05-03 | Discharge: 2022-05-03 | Disposition: A | Discharge status: Home / Self Care | Payer: Medicare Other | Attending: Emergency Medicine | Admitting: Emergency Medicine

## 2022-05-03 ENCOUNTER — Other Ambulatory Visit: Payer: Self-pay

## 2022-05-03 ENCOUNTER — Emergency Department (HOSPITAL_COMMUNITY): Payer: Medicare Other

## 2022-05-03 DIAGNOSIS — N189 Chronic kidney disease, unspecified: Secondary | ICD-10-CM | POA: Diagnosis not present

## 2022-05-03 DIAGNOSIS — R600 Localized edema: Secondary | ICD-10-CM | POA: Insufficient documentation

## 2022-05-03 DIAGNOSIS — Z7982 Long term (current) use of aspirin: Secondary | ICD-10-CM | POA: Insufficient documentation

## 2022-05-03 DIAGNOSIS — T68XXXA Hypothermia, initial encounter: Secondary | ICD-10-CM

## 2022-05-03 DIAGNOSIS — W938XXA Exposure to other excessive cold of man-made origin, initial encounter: Secondary | ICD-10-CM | POA: Diagnosis not present

## 2022-05-03 DIAGNOSIS — R4182 Altered mental status, unspecified: Secondary | ICD-10-CM | POA: Diagnosis present

## 2022-05-03 DIAGNOSIS — E162 Hypoglycemia, unspecified: Secondary | ICD-10-CM | POA: Diagnosis not present

## 2022-05-03 DIAGNOSIS — Z794 Long term (current) use of insulin: Secondary | ICD-10-CM | POA: Diagnosis not present

## 2022-05-03 LAB — TROPONIN I (HIGH SENSITIVITY)
Troponin I (High Sensitivity): 8 ng/L (ref <18)
Troponin I (High Sensitivity): 7 ng/L

## 2022-05-03 LAB — CBC WITH DIFFERENTIAL/PLATELET
Abs Immature Granulocytes: 0.02 K/uL (ref 0.00–0.07)
Basophils Absolute: 0 K/uL (ref 0.0–0.1)
Basophils Relative: 0 %
Eosinophils Absolute: 0.2 K/uL (ref 0.0–0.5)
Eosinophils Relative: 3 %
HCT: 26.9 % — ABNORMAL LOW (ref 39.0–52.0)
Hemoglobin: 8.4 g/dL — ABNORMAL LOW (ref 13.0–17.0)
Immature Granulocytes: 0 %
Lymphocytes Relative: 9 %
Lymphs Abs: 0.5 K/uL — ABNORMAL LOW (ref 0.7–4.0)
MCH: 27.5 pg (ref 26.0–34.0)
MCHC: 31.2 g/dL (ref 30.0–36.0)
MCV: 87.9 fL (ref 80.0–100.0)
Monocytes Absolute: 0.5 K/uL (ref 0.1–1.0)
Monocytes Relative: 9 %
Neutro Abs: 4.9 K/uL (ref 1.7–7.7)
Neutrophils Relative %: 79 %
Platelets: 193 K/uL (ref 150–400)
RBC: 3.06 MIL/uL — ABNORMAL LOW (ref 4.22–5.81)
RDW: 15.2 % (ref 11.5–15.5)
WBC: 6.1 K/uL (ref 4.0–10.5)
nRBC: 0 % (ref 0.0–0.2)

## 2022-05-03 LAB — I-STAT CHEM 8, ED
BUN: 42 mg/dL — ABNORMAL HIGH (ref 8–23)
Calcium, Ion: 1.22 mmol/L (ref 1.15–1.40)
Chloride: 110 mmol/L (ref 98–111)
Creatinine, Ser: 3.9 mg/dL — ABNORMAL HIGH (ref 0.61–1.24)
Glucose, Bld: 80 mg/dL (ref 70–99)
HCT: 29 % — ABNORMAL LOW (ref 39.0–52.0)
Hemoglobin: 9.9 g/dL — ABNORMAL LOW (ref 13.0–17.0)
Potassium: 4.3 mmol/L (ref 3.5–5.1)
Sodium: 143 mmol/L (ref 135–145)
TCO2: 20 mmol/L — ABNORMAL LOW (ref 22–32)

## 2022-05-03 LAB — COMPREHENSIVE METABOLIC PANEL
ALT: 13 U/L (ref 0–44)
AST: 19 U/L (ref 15–41)
Albumin: 3.3 g/dL — ABNORMAL LOW (ref 3.5–5.0)
Alkaline Phosphatase: 57 U/L (ref 38–126)
Anion gap: 10 (ref 5–15)
BUN: 48 mg/dL — ABNORMAL HIGH (ref 8–23)
CO2: 22 mmol/L (ref 22–32)
Calcium: 9 mg/dL (ref 8.9–10.3)
Chloride: 108 mmol/L (ref 98–111)
Creatinine, Ser: 3.46 mg/dL — ABNORMAL HIGH (ref 0.61–1.24)
GFR, Estimated: 17 mL/min — ABNORMAL LOW (ref >60)
Glucose, Bld: 83 mg/dL (ref 70–99)
Potassium: 4.2 mmol/L (ref 3.5–5.1)
Sodium: 140 mmol/L (ref 135–145)
Total Bilirubin: 0.6 mg/dL (ref 0.3–1.2)
Total Protein: 7 g/dL (ref 6.5–8.1)

## 2022-05-03 LAB — URINALYSIS, ROUTINE W REFLEX MICROSCOPIC
Bacteria, UA: NONE SEEN
Bilirubin Urine: NEGATIVE
Glucose, UA: NEGATIVE mg/dL
Hgb urine dipstick: NEGATIVE
Ketones, ur: NEGATIVE mg/dL
Leukocytes,Ua: NEGATIVE
Nitrite: NEGATIVE
Protein, ur: >=300 mg/dL — AB
Specific Gravity, Urine: 1.01 (ref 1.005–1.030)
pH: 5 (ref 5.0–8.0)

## 2022-05-03 LAB — LACTIC ACID, PLASMA
Lactic Acid, Venous: 2.1 mmol/L (ref 0.5–1.9)
Lactic Acid, Venous: 1 mmol/L (ref 0.5–1.9)

## 2022-05-03 LAB — APTT: aPTT: 29 seconds (ref 24–36)

## 2022-05-03 LAB — CBG MONITORING, ED
Glucose-Capillary: 46 mg/dL — ABNORMAL LOW (ref 70–99)
Glucose-Capillary: 80 mg/dL (ref 70–99)
Glucose-Capillary: 119 mg/dL — ABNORMAL HIGH (ref 70–99)
Glucose-Capillary: 123 mg/dL — ABNORMAL HIGH (ref 70–99)

## 2022-05-03 LAB — PROTIME-INR
INR: 1.1 (ref 0.8–1.2)
Prothrombin Time: 14.4 s (ref 11.4–15.2)

## 2022-05-03 MED ORDER — SODIUM CHLORIDE 0.9 % IV BOLUS
1000.0000 mL | Freq: Once | INTRAVENOUS | Status: AC
  Administered 2022-05-03: 1000 mL via INTRAVENOUS

## 2022-05-03 MED ORDER — SODIUM CHLORIDE 0.9 % IV BOLUS: 500.0000 mL | Freq: Once | INTRAVENOUS | Status: DC

## 2022-05-03 MED ORDER — DEXTROSE 50 % IV SOLN
1.0000 | Freq: Once | INTRAVENOUS | Status: AC
  Administered 2022-05-03: 50 mL via INTRAVENOUS
  Filled 2022-05-03: qty 50

## 2022-05-03 MED ORDER — LACTATED RINGERS IV BOLUS: 1000.0000 mL | Freq: Once | INTRAVENOUS | Status: DC

## 2022-05-03 NOTE — ED Triage Notes (Signed)
Family called ems for pt being slow to respond. EMS got a cbg of 56. Pt was given peanut butter, oral glucose and diet soda. Pt CBG is 46 at ED. Pt is slow to respond but is oriented X4

## 2022-05-03 NOTE — ED Provider Notes (Signed)
  Physical Exam  BP (!) 180/96   Pulse 66   Temp (!) 97.5 F (36.4 C) (Oral)   Resp 17   SpO2 99%   Physical Exam Constitutional:      General: He is not in acute distress.    Appearance: Normal appearance.  HENT:     Head: Normocephalic and atraumatic.     Nose: No congestion or rhinorrhea.  Eyes:     General:        Right eye: No discharge.        Left eye: No discharge.     Extraocular Movements: Extraocular movements intact.     Pupils: Pupils are equal, round, and reactive to light.  Cardiovascular:     Rate and Rhythm: Normal rate and regular rhythm.     Heart sounds: No murmur heard. Pulmonary:     Effort: No respiratory distress.     Breath sounds: No wheezing or rales.  Abdominal:     General: There is no distension.     Tenderness: There is no abdominal tenderness.  Musculoskeletal:        General: Normal range of motion.     Cervical back: Normal range of motion.     Right lower leg: Edema present.     Left lower leg: Edema present.  Skin:    General: Skin is warm and dry.  Neurological:     General: No focal deficit present.     Mental Status: He is alert.     Procedures  Procedures  ED Course / MDM   Clinical Course as of 05/03/22 2239  Mon May 03, 2022  1412 Patient had a rectal temp of 34.8.  Initiated sepsis screening labs.  EKG showing left bundle branch block new from prior.  Blood sugar 46 here have ordered D50. [MB]  1432 Repeat fingerstick up to 80.  His speech is more rapid and he is interacting more comfortably.  He said he has eaten a peanut butter sandwich and a ham sandwich and he is not sure why her sugar is low.  He has not received the D50 yet.  Nurses are still working on access. [MB]    Clinical Course User Index [MB] Hayden Rasmussen, MD   Medical Decision Making Amount and/or Complexity of Data Reviewed Labs: ordered. Radiology: ordered. ECG/medicine tests: ordered.  Risk Prescription drug management.   Patient was in  handoff.  Altered mental status with hypoglycemia and hypothermia.  Workup showing a slight worsening of his underlying CKD and patient does have follow-up with a new nephrologist soon.  He was given 1 L lactated Ringer's and after receiving D50 and oral glucose supplementation with food here in the ER, patient's blood sugars improved as well as his altered mental status.  Patient able to ambulate the emergency room without difficulty and he has no evidence of respiratory distress.  Patient discharged with outpatient follow-up      Teressa Lower, MD 05/03/22 2243

## 2022-05-03 NOTE — ED Notes (Signed)
Patient waiting on ride from family.

## 2022-05-03 NOTE — ED Provider Notes (Signed)
[place] Provider Note   CSN: [id number] Arrival date & time: 05/03/22  1339     History  Chief Complaint  Patient presents with   Altered Mental Status    Carlos Gill is a 82 y.o. male.  He is brought in by EMS after being slow to respond.  Had a blood sugar of 56.  Was given peanut butter oral glucose diet soda.  Blood sugar remains low.  Patient is awake slow to respond denies any complaints.  Specifically no headache chest pain abdominal pain vomiting diarrhea urinary symptoms.  Found to have a low temperature here.  The history is provided by the patient.  Altered Mental Status Presenting symptoms: partial responsiveness   Most recent episode:  Today Timing:  Constant Progression:  Unchanged Associated symptoms: no abdominal pain, no difficulty breathing, no headaches, no nausea and no vomiting        Home Medications Prior to Admission medications   Medication Sig Start Date End Date Taking? Authorizing Provider  acetaminophen (TYLENOL) 500 MG tablet Take 1,000 mg by mouth daily as needed for moderate pain.    [provider]  amLODipine (NORVASC) 2.5 MG tablet Take 10 mg by mouth daily. 01/31/21   [provider]  aspirin EC 81 MG tablet Take 81 mg by mouth every morning.    [provider]  augmented betamethasone dipropionate (DIPROLENE-AF) 0.05 % cream Apply 1 Application topically 2 (two) times daily.    [provider]  benzonatate (TESSALON PERLES) 100 MG capsule Take 1 capsule (100 mg total) by mouth 3 (three) times daily as needed for cough. Patient not taking: Reported on 11/02/2021 05/02/16   Avie Echevaria B, PA-C  calcitRIOL (ROCALTROL) 0.25 MCG capsule Take 0.25 mcg by mouth daily. 02/13/21   [provider]  Cholecalciferol (VITAMIN D3) 25 MCG (1000 UT) CAPS Take 1,000 Units by mouth daily. 02/20/20   [provider]  ezetimibe (ZETIA) 10 MG tablet Take 10 mg  by mouth daily.    [provider]  ferrous sulfate 325 (65 FE) MG tablet Take 325 mg by mouth every Monday, Wednesday, and Friday. 02/13/21   [provider]  furosemide (LASIX) 20 MG tablet Take 20-40 mg by mouth See admin instructions. Take 2 tablets (40 mg) in the morning and 1 tablet (20 mg) at night.    [provider]  gemfibrozil (LOPID) 600 MG tablet Take 600 mg by mouth 2 (two) times daily.    [provider]  insulin aspart (NOVOLOG) 100 UNIT/ML injection Inject 6-18 Units into the skin 3 (three) times daily with meals. Per sliding scale    [provider]  insulin glargine (LANTUS) 100 units/mL SOLN Inject 50 Units into the skin 2 (two) times daily.    [provider]  losartan (COZAAR) 100 MG tablet Take 50 mg by mouth 2 (two) times daily.    [provider]  metoprolol tartrate (LOPRESSOR) 50 MG tablet Take 50 mg by mouth 2 (two) times daily.    [provider]  Multiple Vitamin (MULTIVITAMIN WITH MINERALS) TABS Take 1 tablet by mouth daily. Patient not taking: Reported on 11/02/2021    [provider]  Multiple Vitamins-Minerals (PRESERVISION AREDS 2) CAPS Take 1 capsule by mouth 2 (two) times daily.    [provider]  omeprazole (PRILOSEC) 20 MG capsule Take 20 mg by mouth daily.    [provider]  OZEMPIC, 0.25 OR 0.5  MG/DOSE, 2 MG/3ML SOPN Inject 0.5 mg into the skin once a week. Monday 08/05/21   [provider]  polyvinyl alcohol (LIQUIFILM TEARS) 1.4 % ophthalmic solution Place 1 drop into both eyes daily.    [provider]  potassium chloride SA (K-DUR,KLOR-CON) 20 MEQ tablet Take 20 mEq by mouth daily as needed (for cramping).  Patient not taking: Reported on 11/02/2021    [provider]  tamsulosin (FLOMAX) 0.4 MG CAPS capsule Take 0.4 mg by mouth daily. Takes 2 tablets daily Patient not taking: Reported on 11/02/2021    [provider]   trospium (SANCTURA) 20 MG tablet Take 20 mg by mouth 2 (two) times daily. Patient not taking: Reported on 11/02/2021    [provider]      Allergies    Statins    Review of Systems   Review of Systems  Gastrointestinal:  Negative for abdominal pain, nausea and vomiting.  Neurological:  Negative for headaches.    Physical Exam Updated Vital Signs BP (!) 167/76   Pulse 75   Temp (!) 94.6 F (34.8 C) (Rectal)   Resp 17   SpO2 100%  Physical Exam Vitals and nursing note reviewed.  Constitutional:      General: He is not in acute distress.    Appearance: Normal appearance. He is well-developed.  HENT:     Head: Normocephalic and atraumatic.  Eyes:     Conjunctiva/sclera: Conjunctivae normal.  Cardiovascular:     Rate and Rhythm: Normal rate and regular rhythm.     Heart sounds: No murmur heard. Pulmonary:     Effort: Pulmonary effort is normal. No respiratory distress.     Breath sounds: Normal breath sounds.  Abdominal:     Palpations: Abdomen is soft.     Tenderness: There is no abdominal tenderness. There is no guarding or rebound.  Musculoskeletal:        General: No deformity.     Cervical back: Neck supple.     Right lower leg: Edema present.     Left lower leg: Edema present.  Skin:    General: Skin is warm and dry.     Capillary Refill: Capillary refill takes less than 2 seconds.  Neurological:     General: No focal deficit present.     Mental Status: He is alert and oriented to person, place, and time.     Comments: Patient is slow to respond but is oriented.  He is able to move all extremities without any difficulty.     ED Results / Procedures / Treatments   Labs (all labs ordered are listed, but only abnormal results are displayed) Labs Reviewed  LACTIC ACID, PLASMA - Abnormal; Notable for the following components:      Result Value   Lactic Acid, Venous 2.1 (*)    All other components within normal limits  COMPREHENSIVE METABOLIC PANEL  - Abnormal; Notable for the following components:   BUN 48 (*)    Creatinine, Ser 3.46 (*)    Albumin 3.3 (*)    GFR, Estimated 17 (*)    All other components within normal limits  CBC WITH DIFFERENTIAL/PLATELET - Abnormal; Notable for the following components:   RBC 3.06 (*)    Hemoglobin 8.4 (*)    HCT 26.9 (*)    Lymphs Abs 0.5 (*)    All other components within normal limits  URINALYSIS, ROUTINE W REFLEX MICROSCOPIC - Abnormal; Notable for the following components:   Color, Urine  STRAW (*)    Protein, ur >=300 (*)    All other components within normal limits  CBG MONITORING, ED - Abnormal; Notable for the following components:   Glucose-Capillary 46 (*)    All other components within normal limits  I-STAT CHEM 8, ED - Abnormal; Notable for the following components:   BUN 42 (*)    Creatinine, Ser 3.90 (*)    TCO2 20 (*)    Hemoglobin 9.9 (*)    HCT 29.0 (*)    All other components within normal limits  CBG MONITORING, ED - Abnormal; Notable for the following components:   Glucose-Capillary 123 (*)    All other components within normal limits  CULTURE, BLOOD (ROUTINE X 2)  CULTURE, BLOOD (ROUTINE X 2)  LACTIC ACID, PLASMA  PROTIME-INR  APTT  CBG MONITORING, ED  TROPONIN I (HIGH SENSITIVITY)  TROPONIN I (HIGH SENSITIVITY)    EKG EKG Interpretation  Date/Time:  Monday May 03 2022 13:51:42 EDT Ventricular Rate:  60 PR Interval:  160 QRS Duration: 161 QT Interval:  482 QTC Calculation: 482 R Axis:   -68 Text Interpretation: Sinus rhythm Left bundle branch block Baseline wander in lead(s) V5 new LBBB from prior 4/18 Confirmed by Aletta Edouard [phone]) on 05/03/2022 1:55:53 PM  Radiology DG Chest Port 1 View  Result Date: 05/03/2022 CLINICAL DATA:  ?  Sepsis EXAM: PORTABLE CHEST 1 VIEW COMPARISON:  For 118 FINDINGS: Cardiac silhouette appears prominent. No pneumonia or pulmonary edema. No pneumothorax or pleural effusion. IMPRESSION: Enlarged cardiac silhouette.  Electronically Signed   By: Sammie Bench M.D.   On: 05/03/2022 14:28    Procedures Procedures    Medications Ordered in ED Medications  dextrose 50 % solution 50 mL (50 mLs Intravenous Given 05/03/22 1433)  sodium chloride 0.9 % bolus 1,000 mL (1,000 mLs Intravenous New Bag/Given 05/03/22 1539)    ED Course/ Medical Decision Making/ A&P Clinical Course as of 05/03/22 1745  Mon May 03, 2022  1412 Patient had a rectal temp of 34.8.  Initiated sepsis screening labs.  EKG showing left bundle branch block new from prior.  Blood sugar 46 here have ordered D50. [MB]  1432 Repeat fingerstick up to 80.  His speech is more rapid and he is interacting more comfortably.  He said he has eaten a peanut butter sandwich and a ham sandwich and he is not sure why her sugar is low.  He has not received the D50 yet.  Nurses are still working on access. [MB]    Clinical Course User Index [MB] Hayden Rasmussen, MD                             Medical Decision Making Amount and/or Complexity of Data Reviewed Labs: ordered. Radiology: ordered. ECG/medicine tests: ordered.  Risk Prescription drug management.   This patient complains of change in mental status low blood sugar; this involves an extensive number of treatment Options and is a complaint that carries with it a high risk of complications and morbidity. The differential includes sepsis, Sirs, hypoglycemia, renal failure, dehydration, hypothyroidism, infection  I ordered, reviewed and interpreted labs, which included CBC with normal white count low stable hemoglobin, chemistries with worsening CKD troponin unremarkable lactate normal, fingerstick low given D50, urinalysis without clear signs of infection I ordered medication D50 IV fluids and reviewed PMP when indicated. I ordered imaging studies which included chest x-ray and I independently    visualized  and interpreted imaging which showed no acute findings Additional history obtained from  EMS Previous records obtained and reviewed in epic no recent admissions Cardiac monitoring reviewed, normal sinus rhythm Social determinants considered, no significant barriers Critical Interventions: None  After the interventions stated above, I reevaluated the patient and found patient's mental status to be improved as his blood sugar improved Admission and further testing considered, patient will need further workup due to his hypothermia.  His care is signed out to Dr. Matilde Sprang to follow-up on results of testing and repeat blood sugar evaluation.  If he remains asymptomatic and his temperature improves he likely can be discharged to follow-up outpatient with his treatment team.         Final Clinical Impression(s) / ED Diagnoses Final diagnoses:  Hypoglycemia  Chronic kidney disease, unspecified CKD stage  Hypothermia, initial encounter    Rx / DC Orders ED Discharge Orders     None         Hayden Rasmussen, MD 05/03/22 [phone]

## 2022-05-08 LAB — CULTURE, BLOOD (ROUTINE X 2)
Culture: NO GROWTH
Culture: NO GROWTH
Special Requests: ADEQUATE
Special Requests: ADEQUATE

## 2022-07-27 ENCOUNTER — Encounter: Payer: Self-pay | Admitting: Vascular Surgery

## 2022-07-28 ENCOUNTER — Ambulatory Visit (INDEPENDENT_AMBULATORY_CARE_PROVIDER_SITE_OTHER): Payer: Medicare Other | Admitting: Vascular Surgery

## 2022-07-28 ENCOUNTER — Encounter: Payer: Self-pay | Admitting: Vascular Surgery

## 2022-07-28 VITALS — BP 179/69 | HR 70 | Temp 96.9°F | Ht 72.0 in | Wt 229.6 lb

## 2022-07-28 DIAGNOSIS — N185 Chronic kidney disease, stage 5: Secondary | ICD-10-CM

## 2022-07-28 NOTE — H&P (View-Only) (Signed)
 [facility] [place]  Patient name: Carlos Gill MRN: [id number] DOB: [birth year] Sex: male  REASON FOR CONSULT: Discuss access for hemodialysis, CKD 5  HPI: Carlos Gill is a 82 y.o. male, who is here today for discussion of hemodialysis access.  He is felt to be approaching need for hemodialysis.  He is right-handed.  He does not have a pacemaker.  He is not on anticoagulant.  His renal insufficiency is felt to be due to diabetes and hypertension.  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 Last Assessment & Plan:  Formatting of this note is different from the original.  - We will continue Flomax.   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   Obesity 04/11/2017   Osteoarthritis 11/11/2008   Other chest pain 04/21/2021   Proteinuria 01/21/2021   Rhinitis, chronic 03/09/2021   Shortness of breath 04/21/2021   Sleep apnea    cpap    Trochanteric bursitis of right hip 04/27/2017   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    Family History  Problem Relation Age of Onset   Diabetes Mother    Prostate cancer Maternal Grandfather    Colon cancer Neg Hx     SOCIAL HISTORY: Social History   Socioeconomic History   Marital status: Married    Spouse name: Not on file   Number of children: Not on file   Years of education: Not on file   Highest education level: Not on file  Occupational History   Not on file  Tobacco Use   Smoking status: Former    Types: Cigarettes    Quit date: 02/01/1990    Years since quitting: 32.5   Smokeless tobacco: Never  Vaping Use   Vaping Use: Never used  Substance and Sexual Activity   Alcohol use: No   Drug use: No   Sexual activity: Not on file  Other Topics Concern   Not on file  Social History Narrative   Not on file   Social Determinants of Health   Financial Resource Strain: Not on file  Food Insecurity: Not on file  Transportation Needs: Not on file  Physical Activity: Not on file  Stress: Not on file  Social Connections: Not on file  Intimate Partner Violence: Not on file    Allergies  Allergen Reactions   Statins Other (See Comments)    Muscle aches    Current Outpatient Medications  Medication Sig Dispense Refill   acetaminophen (TYLENOL) 500 MG tablet Take 1,000 mg by mouth daily as needed for moderate pain.  amLODipine (NORVASC) 10 MG tablet Take 10 mg by mouth daily.     aspirin EC 81 MG tablet Take 81 mg by mouth every morning.     augmented betamethasone dipropionate (DIPROLENE-AF) 0.05 % cream Apply 1 Application topically 2 (two) times daily.     calcitRIOL (ROCALTROL) 0.25 MCG capsule Take 0.25 mcg by mouth daily.     Carboxymethylcellulose Sod PF (THERATEARS PF) 0.25 % SOLN      Cholecalciferol (VITAMIN D3) 25 MCG (1000 UT) CAPS Take 1,000 Units by mouth daily.     Continuous Glucose Sensor (FREESTYLE LIBRE 2  SENSOR) MISC as directed topically change every 14 days to monitor blood glucose continuously     ezetimibe (ZETIA) 10 MG tablet Take 10 mg by mouth daily.     ferrous sulfate 325 (65 FE) MG tablet Take 325 mg by mouth every Monday, Wednesday, and Friday.     fluticasone (FLONASE) 50 MCG/ACT nasal spray 2 sprays  in the morning.     furosemide (LASIX) 20 MG tablet Take 20-40 mg by mouth See admin instructions. Take 2 tablets (40 mg) in the morning and 1 tablet (20 mg) at night.     gabapentin (NEURONTIN) 100 MG capsule Take 100 mg by mouth.     gemfibrozil (LOPID) 600 MG tablet Take 600 mg by mouth 2 (two) times daily.     hydrALAZINE (APRESOLINE) 25 MG tablet Take 25 mg by mouth.     insulin aspart (NOVOLOG) 100 UNIT/ML injection Inject 6-18 Units into the skin 3 (three) times daily with meals. Per sliding scale     insulin aspart protamine- aspart (NOVOLOG MIX 70/30) (70-30) 100 UNIT/ML injection Inject into the skin.     insulin glargine (LANTUS) 100 units/mL SOLN Inject 50 Units into the skin 2 (two) times daily.     insulin lispro (HUMALOG) 100 UNIT/ML KwikPen inject 4-12  units before each meal Subcutaneous     metoprolol tartrate (LOPRESSOR) 50 MG tablet Take 50 mg by mouth 2 (two) times daily.     Multiple Vitamins-Minerals (PRESERVISION AREDS 2) CAPS Take 1 capsule by mouth 2 (two) times daily.     NIFEdipine (ADALAT CC) 30 MG 24 hr tablet Take 30 mg by mouth daily.     omeprazole (PRILOSEC) 20 MG capsule Take 20 mg by mouth daily.     OZEMPIC, 0.25 OR 0.5 MG/DOSE, 2 MG/3ML SOPN Inject 0.5 mg into the skin once a week. Monday     polyvinyl alcohol (LIQUIFILM TEARS) 1.4 % ophthalmic solution Place 1 drop into both eyes daily.     potassium chloride SA (K-DUR,KLOR-CON) 20 MEQ tablet Take 20 mEq by mouth daily as needed (for cramping).     rosuvastatin (CRESTOR) 20 MG tablet Take 20 mg by mouth once a week.     tamsulosin (FLOMAX) 0.4 MG CAPS capsule Take 0.4 mg by mouth daily. Takes 2  tablets daily     No current facility-administered medications for this visit.    REVIEW OF SYSTEMS:  [X]  denotes positive finding, [ ]  denotes negative finding Cardiac  Comments:  Chest pain or chest pressure:    Shortness of breath upon exertion:    Short of breath when lying flat:    Irregular heart rhythm:        Vascular    Pain in calf, thigh, or hip brought on by ambulation:    Pain in feet at night that wakes you up from your sleep:     Blood  clot in your veins:    Leg swelling:         Pulmonary    Oxygen at home:    Productive cough:     Wheezing:         Neurologic    Sudden weakness in arms or legs:     Sudden numbness in arms or legs:     Sudden onset of difficulty speaking or slurred speech:    Temporary loss of vision in one eye:     Problems with dizziness:         Gastrointestinal    Blood in stool:     Vomited blood:         Genitourinary    Burning when urinating:     Blood in urine:        Psychiatric    Major depression:         Hematologic    Bleeding problems:    Problems with blood clotting too easily:        Skin    Rashes or ulcers:        Constitutional    Fever or chills:      PHYSICAL EXAM: Vitals:   07/28/22 1341  BP: (!) 179/69  Pulse: 70  Temp: (!) 96.9 F (36.1 C)  SpO2: 100%  Weight: 229 lb 9.6 oz (104.1 kg)  Height: 6' (1.829 m)    GENERAL: The patient is a well-nourished male, in no acute distress. The vital signs are documented above. CARDIOVASCULAR: 2+ radial pulses bilaterally.  Moderate size left antecubital vein. PULMONARY: There is good air exchange  MUSCULOSKELETAL: There are no major deformities or cyanosis. NEUROLOGIC: No focal weakness or paresthesias are detected. SKIN: There are no ulcers or rashes noted. PSYCHIATRIC: The patient has a normal affect.  DATA:  Imaged his left arm with Sono[employer] ultrasound.  He has a small cephalic vein in the forearm but does have a large cephalic vein at the  antecubital space and moderate cephalic vein above the antecubital space.  The vein does run deep to the fat.  MEDICAL ISSUES: I discussed options for hemodialysis at length with the patient.  I discussed AV fistula, AV graft, tunneled hemodialysis catheter.  He does appear to be a good candidate for left brachiocephalic fistula creation.  I discussed this would be as an outpatient at [facility] with one of my [employer] partners.  We will schedule this at his earliest convenience.  I also discussed the potential for nonmaturation.   Larina Earthly, MD FACS [facility] [phone] Pager [phone]  Note: Portions of this report may have been transcribed using voice recognition software.  Every effort has been made to ensure accuracy; however, inadvertent computerized transcription errors may still be present.

## 2022-07-28 NOTE — Progress Notes (Signed)
[facility] [place]  Patient name: Carlos Gill MRN: [id number] DOB: [birth year] Sex: male  REASON FOR CONSULT: Discuss access for hemodialysis, CKD 5  HPI: Carlos Gill is a 82 y.o. male, who is here today for discussion of hemodialysis access.  He is felt to be approaching need for hemodialysis.  He is right-handed.  He does not have a pacemaker.  He is not on anticoagulant.  His renal insufficiency is felt to be due to diabetes and hypertension.  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 Last Assessment & Plan:  Formatting of this note is different from the original.  - We will continue Flomax.   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   Obesity 04/11/2017   Osteoarthritis 11/11/2008   Other chest pain 04/21/2021   Proteinuria 01/21/2021   Rhinitis, chronic 03/09/2021   Shortness of breath 04/21/2021   Sleep apnea    cpap    Trochanteric bursitis of right hip 04/27/2017   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    Family History  Problem Relation Age of Onset   Diabetes Mother    Prostate cancer Maternal Grandfather    Colon cancer Neg Hx     SOCIAL HISTORY: Social History   Socioeconomic History   Marital status: Married    Spouse name: Not on file   Number of children: Not on file   Years of education: Not on file   Highest education level: Not on file  Occupational History   Not on file  Tobacco Use   Smoking status: Former    Types: Cigarettes    Quit date: 02/01/1990    Years since quitting: 32.5   Smokeless tobacco: Never  Vaping Use   Vaping Use: Never used  Substance and Sexual Activity   Alcohol use: No   Drug use: No   Sexual activity: Not on file  Other Topics Concern   Not on file  Social History Narrative   Not on file   Social Determinants of Health   Financial Resource Strain: Not on file  Food Insecurity: Not on file  Transportation Needs: Not on file  Physical Activity: Not on file  Stress: Not on file  Social Connections: Not on file  Intimate Partner Violence: Not on file    Allergies  Allergen Reactions   Statins Other (See Comments)    Muscle aches    Current Outpatient Medications  Medication Sig Dispense Refill   acetaminophen (TYLENOL) 500 MG tablet Take 1,000 mg by mouth daily as needed for moderate pain.  amLODipine (NORVASC) 10 MG tablet Take 10 mg by mouth daily.     aspirin EC 81 MG tablet Take 81 mg by mouth every morning.     augmented betamethasone dipropionate (DIPROLENE-AF) 0.05 % cream Apply 1 Application topically 2 (two) times daily.     calcitRIOL (ROCALTROL) 0.25 MCG capsule Take 0.25 mcg by mouth daily.     Carboxymethylcellulose Sod PF (THERATEARS PF) 0.25 % SOLN      Cholecalciferol (VITAMIN D3) 25 MCG (1000 UT) CAPS Take 1,000 Units by mouth daily.     Continuous Glucose Sensor (FREESTYLE LIBRE 2  SENSOR) MISC as directed topically change every 14 days to monitor blood glucose continuously     ezetimibe (ZETIA) 10 MG tablet Take 10 mg by mouth daily.     ferrous sulfate 325 (65 FE) MG tablet Take 325 mg by mouth every Monday, Wednesday, and Friday.     fluticasone (FLONASE) 50 MCG/ACT nasal spray 2 sprays  in the morning.     furosemide (LASIX) 20 MG tablet Take 20-40 mg by mouth See admin instructions. Take 2 tablets (40 mg) in the morning and 1 tablet (20 mg) at night.     gabapentin (NEURONTIN) 100 MG capsule Take 100 mg by mouth.     gemfibrozil (LOPID) 600 MG tablet Take 600 mg by mouth 2 (two) times daily.     hydrALAZINE (APRESOLINE) 25 MG tablet Take 25 mg by mouth.     insulin aspart (NOVOLOG) 100 UNIT/ML injection Inject 6-18 Units into the skin 3 (three) times daily with meals. Per sliding scale     insulin aspart protamine- aspart (NOVOLOG MIX 70/30) (70-30) 100 UNIT/ML injection Inject into the skin.     insulin glargine (LANTUS) 100 units/mL SOLN Inject 50 Units into the skin 2 (two) times daily.     insulin lispro (HUMALOG) 100 UNIT/ML KwikPen inject 4-12  units before each meal Subcutaneous     metoprolol tartrate (LOPRESSOR) 50 MG tablet Take 50 mg by mouth 2 (two) times daily.     Multiple Vitamins-Minerals (PRESERVISION AREDS 2) CAPS Take 1 capsule by mouth 2 (two) times daily.     NIFEdipine (ADALAT CC) 30 MG 24 hr tablet Take 30 mg by mouth daily.     omeprazole (PRILOSEC) 20 MG capsule Take 20 mg by mouth daily.     OZEMPIC, 0.25 OR 0.5 MG/DOSE, 2 MG/3ML SOPN Inject 0.5 mg into the skin once a week. Monday     polyvinyl alcohol (LIQUIFILM TEARS) 1.4 % ophthalmic solution Place 1 drop into both eyes daily.     potassium chloride SA (K-DUR,KLOR-CON) 20 MEQ tablet Take 20 mEq by mouth daily as needed (for cramping).     rosuvastatin (CRESTOR) 20 MG tablet Take 20 mg by mouth once a week.     tamsulosin (FLOMAX) 0.4 MG CAPS capsule Take 0.4 mg by mouth daily. Takes 2  tablets daily     No current facility-administered medications for this visit.    REVIEW OF SYSTEMS:  [X]  denotes positive finding, [ ]  denotes negative finding Cardiac  Comments:  Chest pain or chest pressure:    Shortness of breath upon exertion:    Short of breath when lying flat:    Irregular heart rhythm:        Vascular    Pain in calf, thigh, or hip brought on by ambulation:    Pain in feet at night that wakes you up from your sleep:     Blood  clot in your veins:    Leg swelling:         Pulmonary    Oxygen at home:    Productive cough:     Wheezing:         Neurologic    Sudden weakness in arms or legs:     Sudden numbness in arms or legs:     Sudden onset of difficulty speaking or slurred speech:    Temporary loss of vision in one eye:     Problems with dizziness:         Gastrointestinal    Blood in stool:     Vomited blood:         Genitourinary    Burning when urinating:     Blood in urine:        Psychiatric    Major depression:         Hematologic    Bleeding problems:    Problems with blood clotting too easily:        Skin    Rashes or ulcers:        Constitutional    Fever or chills:      PHYSICAL EXAM: Vitals:   07/28/22 1341  BP: (!) 179/69  Pulse: 70  Temp: (!) 96.9 F (36.1 C)  SpO2: 100%  Weight: 229 lb 9.6 oz (104.1 kg)  Height: 6' (1.829 m)    GENERAL: The patient is a well-nourished male, in no acute distress. The vital signs are documented above. CARDIOVASCULAR: 2+ radial pulses bilaterally.  Moderate size left antecubital vein. PULMONARY: There is good air exchange  MUSCULOSKELETAL: There are no major deformities or cyanosis. NEUROLOGIC: No focal weakness or paresthesias are detected. SKIN: There are no ulcers or rashes noted. PSYCHIATRIC: The patient has a normal affect.  DATA:  Imaged his left arm with SonoSite ultrasound.  He has a small cephalic vein in the forearm but does have a large cephalic vein at the  antecubital space and moderate cephalic vein above the antecubital space.  The vein does run deep to the fat.  MEDICAL ISSUES: I discussed options for hemodialysis at length with the patient.  I discussed AV fistula, AV graft, tunneled hemodialysis catheter.  He does appear to be a good candidate for left brachiocephalic fistula creation.  I discussed this would be as an outpatient at [facility] with one of my [employer] partners.  We will schedule this at his earliest convenience.  I also discussed the potential for nonmaturation.   Larina Earthly, MD FACS [facility] [phone] Pager [phone]  Note: Portions of this report may have been transcribed using voice recognition software.  Every effort has been made to ensure accuracy; however, inadvertent computerized transcription errors may still be present.

## 2022-08-02 ENCOUNTER — Telehealth: Payer: Self-pay

## 2022-08-02 NOTE — Telephone Encounter (Signed)
Attempted to reach pt to schedule his AVF surgery. Pt's voicemail box was full-unable to leave voicemail.

## 2022-08-10 ENCOUNTER — Other Ambulatory Visit: Payer: Self-pay

## 2022-08-10 DIAGNOSIS — N185 Chronic kidney disease, stage 5: Secondary | ICD-10-CM

## 2022-08-10 NOTE — Telephone Encounter (Signed)
Patient returned call. Scheduled surgery for 7/15. Instructions provided and pt verbalized understanding. Pt reports he no longer takes Ozempic. Medication list updated.

## 2022-08-13 ENCOUNTER — Other Ambulatory Visit: Payer: Self-pay

## 2022-08-13 ENCOUNTER — Encounter (HOSPITAL_COMMUNITY): Payer: Self-pay | Admitting: Vascular Surgery

## 2022-08-13 NOTE — Progress Notes (Addendum)
Mr. Carlos Gill denies chest pain or shortness of breath at this time. Patient denies having any s/s of Covid in her household, also denies any known exposure to Covid. Mr. Carlos Gill denies  any s/s of upper or lower respiratory in the past 8 weeks.  Mr. Carlos Gill has type II diabetes, patient has a Free style continuous glucose reader.  Mr. Carlos Gill reports that CBG this am was 101  and it may go up to 200.  Mr. Carlos Gill was seen in [facility] ED on 05/03/22 with hypoglycemia, glucose was 56, he presented with altered mental status.- A1C on that date was 7.1.   I instructed Mr. Carlos Gill to take 1/2 of Lantus- 22 units Sunday evening, Monday if CBG is greater than 70 take 22 units of Lantus Insulin.  If CBG is > than 220 take 1/2 of  Novolog sliding scale. If CBG is less than 70, do not take Lantus, treat low glucose with 1/2 cup of clear juice or 1 tube of glucose gel or 4 glucose tablets, recheck CBG in 15 minutes, call pre- op desk for more instructions. Mr. Carlos Gill continuous glucose reader is on his left are, I instructed that it will need to be remove since surgery is on his left arm, Patient said he will remove reader and leave it at home.  Mr. Carlos Gill has a history of chest pain, patient states that his Drs know, PCP is Dr. Senaida Ores, cardiologist is at the [place]. ( I do not see any recent visits to cardiologist).  Mr Carlos Gill states that he  had chest pain 2- 3 weeks ago, he becomes short of breath with chest pain, patient rates chest pain at a 3-4, mid chest, dull ache. Chest pain happens when he is sitting, may last as long as 1/2 hour and he does not take anything to treat the pain. I spoke with Dr Aleene Davidson, he said patient will be evaluated on Monday am, he may be cancelled.

## 2022-08-16 ENCOUNTER — Telehealth: Payer: Self-pay

## 2022-08-16 ENCOUNTER — Ambulatory Visit (HOSPITAL_COMMUNITY)
Admission: RE | Admit: 2022-08-16 | Discharge: 2022-08-16 | Disposition: A | Discharge status: Home / Self Care | Payer: No Typology Code available for payment source | Attending: Vascular Surgery | Admitting: Vascular Surgery

## 2022-08-16 ENCOUNTER — Ambulatory Visit (HOSPITAL_BASED_OUTPATIENT_CLINIC_OR_DEPARTMENT_OTHER): Payer: No Typology Code available for payment source | Admitting: Anesthesiology

## 2022-08-16 ENCOUNTER — Encounter (HOSPITAL_COMMUNITY)
Admission: RE | Disposition: A | Discharge status: Home / Self Care | Payer: Self-pay | Source: Home / Self Care | Attending: Vascular Surgery

## 2022-08-16 ENCOUNTER — Ambulatory Visit (HOSPITAL_COMMUNITY): Payer: No Typology Code available for payment source | Admitting: Anesthesiology

## 2022-08-16 ENCOUNTER — Other Ambulatory Visit: Payer: Self-pay

## 2022-08-16 DIAGNOSIS — I132 Hypertensive heart and chronic kidney disease with heart failure and with stage 5 chronic kidney disease, or end stage renal disease: Secondary | ICD-10-CM

## 2022-08-16 DIAGNOSIS — Z6831 Body mass index (BMI) 31.0-31.9, adult: Secondary | ICD-10-CM | POA: Insufficient documentation

## 2022-08-16 DIAGNOSIS — Z79899 Other long term (current) drug therapy: Secondary | ICD-10-CM | POA: Diagnosis not present

## 2022-08-16 DIAGNOSIS — J449 Chronic obstructive pulmonary disease, unspecified: Secondary | ICD-10-CM | POA: Insufficient documentation

## 2022-08-16 DIAGNOSIS — Z87891 Personal history of nicotine dependence: Secondary | ICD-10-CM

## 2022-08-16 DIAGNOSIS — G4730 Sleep apnea, unspecified: Secondary | ICD-10-CM | POA: Diagnosis not present

## 2022-08-16 DIAGNOSIS — I5033 Acute on chronic diastolic (congestive) heart failure: Secondary | ICD-10-CM

## 2022-08-16 DIAGNOSIS — Z794 Long term (current) use of insulin: Secondary | ICD-10-CM | POA: Diagnosis not present

## 2022-08-16 DIAGNOSIS — N401 Enlarged prostate with lower urinary tract symptoms: Secondary | ICD-10-CM | POA: Insufficient documentation

## 2022-08-16 DIAGNOSIS — K219 Gastro-esophageal reflux disease without esophagitis: Secondary | ICD-10-CM | POA: Insufficient documentation

## 2022-08-16 DIAGNOSIS — E1122 Type 2 diabetes mellitus with diabetic chronic kidney disease: Secondary | ICD-10-CM | POA: Diagnosis not present

## 2022-08-16 DIAGNOSIS — M1990 Unspecified osteoarthritis, unspecified site: Secondary | ICD-10-CM | POA: Diagnosis not present

## 2022-08-16 DIAGNOSIS — N186 End stage renal disease: Secondary | ICD-10-CM | POA: Diagnosis not present

## 2022-08-16 DIAGNOSIS — E669 Obesity, unspecified: Secondary | ICD-10-CM | POA: Diagnosis not present

## 2022-08-16 DIAGNOSIS — N185 Chronic kidney disease, stage 5: Secondary | ICD-10-CM

## 2022-08-16 DIAGNOSIS — E785 Hyperlipidemia, unspecified: Secondary | ICD-10-CM | POA: Insufficient documentation

## 2022-08-16 HISTORY — PX: AV FISTULA PLACEMENT: SHX1204

## 2022-08-16 LAB — POCT I-STAT, CHEM 8
BUN: 57 mg/dL — ABNORMAL HIGH (ref 8–23)
Calcium, Ion: 1.14 mmol/L — ABNORMAL LOW (ref 1.15–1.40)
Chloride: 115 mmol/L — ABNORMAL HIGH (ref 98–111)
Creatinine, Ser: 5.2 mg/dL — ABNORMAL HIGH (ref 0.61–1.24)
Glucose, Bld: 59 mg/dL — ABNORMAL LOW (ref 70–99)
HCT: 26 % — ABNORMAL LOW (ref 39.0–52.0)
Hemoglobin: 8.8 g/dL — ABNORMAL LOW (ref 13.0–17.0)
Potassium: 3.7 mmol/L (ref 3.5–5.1)
Sodium: 142 mmol/L (ref 135–145)
TCO2: 18 mmol/L — ABNORMAL LOW (ref 22–32)

## 2022-08-16 LAB — GLUCOSE, CAPILLARY
Glucose-Capillary: 100 mg/dL — ABNORMAL HIGH (ref 70–99)
Glucose-Capillary: 55 mg/dL — ABNORMAL LOW (ref 70–99)
Glucose-Capillary: 70 mg/dL (ref 70–99)
Glucose-Capillary: 93 mg/dL (ref 70–99)
Glucose-Capillary: 114 mg/dL — ABNORMAL HIGH (ref 70–99)
Glucose-Capillary: 104 mg/dL — ABNORMAL HIGH (ref 70–99)

## 2022-08-16 SURGERY — ARTERIOVENOUS (AV) FISTULA CREATION
Anesthesia: Monitor Anesthesia Care | Site: Arm Lower | Laterality: Left

## 2022-08-16 MED ORDER — LIDOCAINE-EPINEPHRINE (PF) 1 %-1:200000 IJ SOLN
INTRAMUSCULAR | Status: AC
  Filled 2022-08-16: qty 30

## 2022-08-16 MED ORDER — EPHEDRINE SULFATE-NACL 50-0.9 MG/10ML-% IV SOSY
PREFILLED_SYRINGE | INTRAVENOUS | Status: DC | PRN
  Administered 2022-08-16 (×2): 5 mg via INTRAVENOUS

## 2022-08-16 MED ORDER — CHLORHEXIDINE GLUCONATE 4 % EX SOLN: 60.0000 mL | Freq: Once | CUTANEOUS | Status: DC

## 2022-08-16 MED ORDER — CHLORHEXIDINE GLUCONATE 0.12 % MT SOLN
15.0000 mL | Freq: Once | OROMUCOSAL | Status: AC
  Filled 2022-08-16: qty 15

## 2022-08-16 MED ORDER — ONDANSETRON HCL 4 MG/2ML IJ SOLN
INTRAMUSCULAR | Status: DC | PRN
  Administered 2022-08-16: 4 mg via INTRAVENOUS

## 2022-08-16 MED ORDER — CHLORHEXIDINE GLUCONATE 0.12 % MT SOLN
OROMUCOSAL | Status: AC
  Administered 2022-08-16: 15 mL via OROMUCOSAL
  Filled 2022-08-16: qty 15

## 2022-08-16 MED ORDER — FENTANYL CITRATE (PF) 100 MCG/2ML IJ SOLN: 50.0000 ug | Freq: Once | INTRAMUSCULAR | Status: DC

## 2022-08-16 MED ORDER — FENTANYL CITRATE (PF) 100 MCG/2ML IJ SOLN
INTRAMUSCULAR | Status: AC
  Administered 2022-08-16: 100 ug
  Filled 2022-08-16: qty 2

## 2022-08-16 MED ORDER — ACETAMINOPHEN 500 MG PO TABS
1000.0000 mg | ORAL_TABLET | Freq: Once | ORAL | Status: AC
  Administered 2022-08-16: 1000 mg via ORAL
  Filled 2022-08-16: qty 2

## 2022-08-16 MED ORDER — OXYCODONE-ACETAMINOPHEN 5-325 MG PO TABS: 1.0000 | ORAL_TABLET | Freq: Four times a day (QID) | ORAL | 0 refills | Status: DC | PRN

## 2022-08-16 MED ORDER — ORAL CARE MOUTH RINSE: 15.0000 mL | Freq: Once | OROMUCOSAL | Status: AC

## 2022-08-16 MED ORDER — LIDOCAINE-EPINEPHRINE (PF) 1.5 %-1:200000 IJ SOLN
INTRAMUSCULAR | Status: DC | PRN
  Administered 2022-08-16: 30 mL via PERINEURAL

## 2022-08-16 MED ORDER — DEXTROSE 50 % IV SOLN
INTRAVENOUS | Status: AC
  Administered 2022-08-16 (×2): 25 mL
  Filled 2022-08-16: qty 50

## 2022-08-16 MED ORDER — SODIUM CHLORIDE 0.9 % IV SOLN: INTRAVENOUS | Status: DC

## 2022-08-16 MED ORDER — CEFAZOLIN SODIUM-DEXTROSE 2-4 GM/100ML-% IV SOLN
2.0000 g | INTRAVENOUS | Status: AC
  Administered 2022-08-16: 2 g via INTRAVENOUS
  Filled 2022-08-16: qty 100

## 2022-08-16 MED ORDER — PROPOFOL 500 MG/50ML IV EMUL
INTRAVENOUS | Status: DC | PRN
  Administered 2022-08-16: 40 ug/kg/min via INTRAVENOUS

## 2022-08-16 MED ORDER — 0.9 % SODIUM CHLORIDE (POUR BTL) OPTIME
TOPICAL | Status: DC | PRN
  Administered 2022-08-16: 1000 mL

## 2022-08-16 MED ORDER — INSULIN ASPART 100 UNIT/ML IJ SOLN: 0.0000 [IU] | INTRAMUSCULAR | Status: DC | PRN

## 2022-08-16 MED ORDER — HEPARIN 6000 UNIT IRRIGATION SOLUTION
Status: AC
  Filled 2022-08-16: qty 500

## 2022-08-16 MED ORDER — HEPARIN 6000 UNIT IRRIGATION SOLUTION
Status: DC | PRN
  Administered 2022-08-16: 1

## 2022-08-16 SURGICAL SUPPLY — 41 items
APL PRP STRL LF DISP 70% ISPRP (MISCELLANEOUS) ×1
APL SKNCLS STERI-STRIP NONHPOA (GAUZE/BANDAGES/DRESSINGS) ×1
ARMBAND PINK RESTRICT EXTREMIT (MISCELLANEOUS) ×1 IMPLANT
BENZOIN TINCTURE PRP APPL 2/3 (GAUZE/BANDAGES/DRESSINGS) ×1 IMPLANT
CANISTER SUCT 3000ML PPV (MISCELLANEOUS) ×1 IMPLANT
CANNULA VESSEL 3MM 2 BLNT TIP (CANNULA) ×1 IMPLANT
CHLORAPREP W/TINT 26 (MISCELLANEOUS) ×1 IMPLANT
CLIP LIGATING EXTRA MED SLVR (CLIP) ×1 IMPLANT
CLIP LIGATING EXTRA SM BLUE (MISCELLANEOUS) ×1 IMPLANT
CLSR STERI-STRIP ANTIMIC 1/2X4 (GAUZE/BANDAGES/DRESSINGS) IMPLANT
COVER PROBE W GEL 5X96 (DRAPES) IMPLANT
DRSG TEGADERM 4X4.75 (GAUZE/BANDAGES/DRESSINGS) IMPLANT
ELECT REM PT RETURN 9FT ADLT (ELECTROSURGICAL) ×1
ELECTRODE REM PT RTRN 9FT ADLT (ELECTROSURGICAL) ×1 IMPLANT
GAUZE SPONGE 4X4 12PLY STRL (GAUZE/BANDAGES/DRESSINGS) IMPLANT
GLOVE BIO SURGEON STRL SZ8 (GLOVE) ×1 IMPLANT
GOWN STRL REUS W/ TWL LRG LVL3 (GOWN DISPOSABLE) ×2 IMPLANT
GOWN STRL REUS W/ TWL XL LVL3 (GOWN DISPOSABLE) ×1 IMPLANT
GOWN STRL REUS W/TWL LRG LVL3 (GOWN DISPOSABLE) ×2
GOWN STRL REUS W/TWL XL LVL3 (GOWN DISPOSABLE) ×1
INSERT FOGARTY SM (MISCELLANEOUS) IMPLANT
KIT BASIN OR (CUSTOM PROCEDURE TRAY) ×1 IMPLANT
KIT TURNOVER KIT B (KITS) ×1 IMPLANT
LOOP VASCULAR MINI 18 RED (MISCELLANEOUS) ×1
NDL 18GX1X1/2 (RX/OR ONLY) (NEEDLE) IMPLANT
NEEDLE 18GX1X1/2 (RX/OR ONLY) (NEEDLE) IMPLANT
NS IRRIG 1000ML POUR BTL (IV SOLUTION) ×1 IMPLANT
PACK CV ACCESS (CUSTOM PROCEDURE TRAY) ×1 IMPLANT
PAD ARMBOARD 7.5X6 YLW CONV (MISCELLANEOUS) ×2 IMPLANT
SLING ARM FOAM STRAP LRG (SOFTGOODS) IMPLANT
SLING ARM FOAM STRAP MED (SOFTGOODS) IMPLANT
STRIP CLOSURE SKIN 1/2X4 (GAUZE/BANDAGES/DRESSINGS) ×1 IMPLANT
SUT MNCRL AB 4-0 PS2 18 (SUTURE) ×1 IMPLANT
SUT PROLENE 6 0 BV (SUTURE) ×1 IMPLANT
SUT VIC AB 3-0 SH 27 (SUTURE) ×1
SUT VIC AB 3-0 SH 27X BRD (SUTURE) ×1 IMPLANT
SYR 3ML LL SCALE MARK (SYRINGE) IMPLANT
TOWEL GREEN STERILE (TOWEL DISPOSABLE) ×1 IMPLANT
UNDERPAD 30X36 HEAVY ABSORB (UNDERPADS AND DIAPERS) ×1 IMPLANT
VASCULAR TIE MINI RED 18IN STL (MISCELLANEOUS) IMPLANT
WATER STERILE IRR 1000ML POUR (IV SOLUTION) ×1 IMPLANT

## 2022-08-16 NOTE — Telephone Encounter (Signed)
Pt's grandson called with pt somewhat audible in background. Pt just got home from hospital s/p LUE AVF. He stated pt's arm is starting to swell and they are noticing bleeding from where dressing is. I have advised him to elevate his arm above heart level, ideally using pillows and to monitor the bleeding. They are going to go out and get him gauze pads to redress the area and will call us back if bleeding continues.

## 2022-08-16 NOTE — Anesthesia Procedure Notes (Signed)
Anesthesia Regional Block: Interscalene brachial plexus block   Pre-Anesthetic Checklist: , timeout performed,  Correct Patient, Correct Site, Correct Laterality,  Correct Procedure, Correct Position, site marked,  Risks and benefits discussed,  Surgical consent,  Pre-op evaluation,  At surgeon's request and post-op pain management  Laterality: Left  Prep: chloraprep       Needles:  Injection technique: Single-shot  Needle Type: Echogenic Needle     Needle Length: 9cm  Needle Gauge: 21     Additional Needles:   Procedures:,,,, ultrasound used (permanent image in chart),,    Narrative:  Start time: 08/16/2022 9:06 AM End time: 08/16/2022 9:13 AM Injection made incrementally with aspirations every 5 mL.  Performed by: Personally  Anesthesiologist: Jairo Ben, MD  Additional Notes: Pt identified in Holding room.  Monitors applied. Working IV access confirmed. Sterile prep L clavicle and neck.  #21ga ECHOgenic Arrow block needle to supraclavicular brachial plexus with US guidance.  30cc 1.5% Lidocaine 1:200k epi injected incrementally after negative test dose.  Patient asymptomatic, VSS, no heme aspirated, tolerated well.   Sandford Craze, MD

## 2022-08-16 NOTE — Transfer of Care (Signed)
Immediate Anesthesia Transfer of Care Note  Patient: Carlos Gill  Procedure(s) Performed: LEFT ARM  BRACHIOCEPHALIC ARTERIOVENOUS (AV) FISTULA CREATION (Left: Arm Lower)  Patient Location: PACU  Anesthesia Type:MAC  Level of Consciousness: drowsy and responds to stimulation  Airway & Oxygen Therapy: Patient Spontanous Breathing  Post-op Assessment: Report given to RN and Post -op Vital signs reviewed and stable  Post vital signs: Reviewed and stable  Last Vitals:  Vitals Value Taken Time  BP 151/67 08/16/22 1124  Temp    Pulse 58 08/16/22 1126  Resp 15 08/16/22 1126  SpO2 100 % 08/16/22 1126  Vitals shown include unfiled device data.  Last Pain:  Vitals:   08/16/22 0825  TempSrc:   PainSc: 0-No pain         Complications: No notable events documented.

## 2022-08-16 NOTE — Op Note (Signed)
DATE OF SERVICE: 08/16/2022  PATIENT:  Carlos Gill  82 y.o. male  PRE-OPERATIVE DIAGNOSIS:  CKD V  POST-OPERATIVE DIAGNOSIS:  Same  PROCEDURE:   Left brachiocephalic arteriovenous fistula  SURGEON:  Surgeons and Role:    * Leonie Douglas, MD - Primary  ASSISTANT: Lianne Cure, PA-C  An experienced assistant was required given the complexity of this procedure and the standard of surgical care. My assistant helped with exposure through counter tension, suctioning, ligation and retraction to better visualize the surgical field.  My assistant expedited sewing during the case by following my sutures. Wherever I use the term "we" in the report, my assistant actively helped me with that portion of the procedure.  ANESTHESIA:   regional and MAC  EBL: minimal  BLOOD ADMINISTERED:none  DRAINS: none   LOCAL MEDICATIONS USED:  NONE  SPECIMEN:  none  COUNTS: confirmed correct.  TOURNIQUET:  none  PATIENT DISPOSITION:  PACU - hemodynamically stable.   Delay start of Pharmacological VTE agent (>24hrs) due to surgical blood loss or risk of bleeding: no  INDICATION FOR PROCEDURE: Carlos Gill is a 82 y.o. male with CKD V nearing ESRD. After careful discussion of risks, benefits, and alternatives the patient was offered left arm arteriovenous fistula creation. The patient understood and wished to proceed.  OPERATIVE FINDINGS:  Healthy cephalic vein.  Healthy brachial artery.  Anastomosis performed at brachial artery bifurcation in the antecubitum.  At completion, thrill and fistula, Doppler bruit and fistula, palpable left radial artery, strong Doppler flow in left radial artery.  DESCRIPTION OF PROCEDURE: After identification of the patient in the pre-operative holding area, the patient was transferred to the operating room. The patient was positioned supine on the operating room table. Anesthesia was induced. The left arm was prepped and draped in standard fashion. A surgical pause  was performed confirming correct patient, procedure, and operative location.  Using intraoperative ultrasound, the course of the left upper extremity superficial veins was mapped.  The cephalic vein appeared adequate for arteriovenous fistula creation.  The brachial artery was similarly mapped.  The artery appeared adequate for arterial venous creation. We ensured there was no anomalous arterial anatomy such as a high bifurcation.  A transverse incision was made in the left arm just distal to the antecubital crease.  Incision was carried down through subcutaneous tissue until the cephalic vein was identified and skeletonized.  We continued our exposure through the aponeurosis of the biceps.  The brachial artery was encountered its usual position.  The artery was circumferentially exposed and encircled with Silastic Vesseloops.  The distal cephalic vein was transected.  The distal stump of the cephalic vein was oversewn with a 2-0 silk suture.  The proximal vein was controlled with a bulldog clamp.  The brachial artery was clamped proximally and distally.  An anterior arteriotomy was made with a 11 blade.  The arteriotomy was extended with Potts scissors.  Using a parachute technique the cephalic vein was anastomosed to the brachial arteriotomy in end-to-side fashion with continuous running suture of 6-0 Prolene.  Immediately prior to completion the anastomosis was flushed and de-aired.  The anastomosis was completed.  Hemostasis was assured.  The fistula was interrogated with Doppler.  Audible bruit was heard throughout the course of the cephalic vein.  A radial artery signal was heard which augmented slightly with compression of the fistula. Upon completion of the case instrument and sharps counts were confirmed correct. The patient was transferred to the  PACU in  good condition. I was present for all portions of the procedure.  FOLLOW UP PLAN: Assuming a normal postoperative course, VVS PA will see the  patient in 6 weeks with AVF duplex.   Rande Brunt. Lenell Antu, MD [facility] Phone Number: [phone] 08/16/2022 11:14 AM

## 2022-08-16 NOTE — Progress Notes (Addendum)
Hypoglycemic Event  CBG: 70  Treatment: D50 25 mL (12.5 gm)  Symptoms: None  Follow-up CBG: Time: 0940 CBG Result: 93   Possible Reasons for Event: Inadequate meal intake  Comments/MD notified:[name] present in patient room     Carlos Gill [name]

## 2022-08-16 NOTE — Telephone Encounter (Signed)
Called pt again to f/u- 30 min after pt's grandson called [place]. He reports the bleeding has stopped and swelling is unchanged and pt is currently elevating his arm. No further questions/concerns at this time.

## 2022-08-16 NOTE — Discharge Instructions (Signed)

## 2022-08-16 NOTE — Progress Notes (Addendum)
Hypoglycemic Event  CBG: 55  Treatment: D50 25 mL (12.5 gm)  Symptoms: None  Follow-up CBG Time: 0820 CBG Result: 100  Possible Reasons for Event: Inadequate meal intake  Comments/MD notified:Will notify [name]    Carlos Gill [name]

## 2022-08-16 NOTE — Anesthesia Preprocedure Evaluation (Addendum)
Anesthesia Evaluation  Patient identified by MRN, date of birth, ID band Patient awake    Reviewed: Allergy & Precautions, NPO status , Patient's Chart, lab work & pertinent test results, reviewed documented beta blocker date and time   History of Anesthesia Complications Negative for: history of anesthetic complications  Airway Mallampati: II  TM Distance: >3 FB Neck ROM: Full    Dental  (+) Poor Dentition, Missing, Chipped, Dental Advisory Given   Pulmonary shortness of breath, sleep apnea (does not use CPAP) , COPD, former smoker   breath sounds clear to auscultation       Cardiovascular hypertension, Pt. on medications and Pt. 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     Musculoskeletal  (+) Arthritis ,    Abdominal   Peds  Hematology  (+) Blood dyscrasia (Hb 8.8), anemia   Anesthesia Other Findings   Reproductive/Obstetrics                             Anesthesia Physical Anesthesia Plan  ASA: 3  Anesthesia Plan: MAC and Regional   Post-op Pain Management: Regional block* and Tylenol PO (pre-op)*   Induction:   PONV Risk Score and Plan: 1 and Ondansetron and Treatment may vary due to age or medical condition  Airway Management Planned: Natural Airway and Simple Face Mask  Additional Equipment: None  Intra-op Plan:   Post-operative Plan:   Informed Consent: I have reviewed the patients History and Physical, chart, labs and discussed the procedure including the risks, benefits and alternatives for the proposed anesthesia with the patient or authorized representative who has indicated his/her understanding and acceptance.     Dental advisory given  Plan  Discussed with: Surgeon and CRNA  Anesthesia Plan Comments: (Plan routine monitors, supraclavicular block)        Anesthesia Quick Evaluation

## 2022-08-16 NOTE — Anesthesia Postprocedure Evaluation (Signed)
Anesthesia Post Note  Patient: RICKARDO BRINEGAR  Procedure(s) Performed: LEFT ARM  BRACHIOCEPHALIC ARTERIOVENOUS (AV) FISTULA CREATION (Left: Arm Lower)     Patient location during evaluation: PACU Anesthesia Type: Regional Level of consciousness: awake and alert, patient cooperative and oriented Pain management: pain level controlled Vital Signs Assessment: post-procedure vital signs reviewed and stable Respiratory status: nonlabored ventilation, spontaneous breathing and respiratory function stable Cardiovascular status: blood pressure returned to baseline and stable Postop Assessment: no apparent nausea or vomiting, able to ambulate and adequate PO intake Anesthetic complications: no   No notable events documented.  Last Vitals:  Vitals:   08/16/22 0935 08/16/22 1130  BP: (!) 147/75 (!) 150/73  Pulse: 61 (!) 58  Resp: (!) 25 (!) 23  Temp:  36.4 C  SpO2: 96% 100%    Last Pain:  Vitals:   08/16/22 1130  TempSrc:   PainSc: 0-No pain                 Glennis Montenegro,E. Arnetia Bronk

## 2022-08-16 NOTE — Interval H&P Note (Signed)
History and Physical Interval Note:  08/16/2022 7:23 AM  Carlos Gill  has presented today for surgery, with the diagnosis of CKD V.  The various methods of treatment have been discussed with the patient and family. After consideration of risks, benefits and other options for treatment, the patient has consented to  Procedure(s): LEFT ARM ARTERIOVENOUS (AV) FISTULA CREATION (Left) as a surgical intervention.  The patient's history has been reviewed, patient examined, no change in status, stable for surgery.  I have reviewed the patient's chart and labs.  Questions were answered to the patient's satisfaction.     Leonie Douglas

## 2022-08-17 ENCOUNTER — Encounter (HOSPITAL_COMMUNITY): Payer: Self-pay | Admitting: Vascular Surgery

## 2022-08-18 ENCOUNTER — Emergency Department (HOSPITAL_BASED_OUTPATIENT_CLINIC_OR_DEPARTMENT_OTHER)
Admit: 2022-08-18 | Discharge: 2022-08-18 | Disposition: A | Discharge status: Home / Self Care | Payer: No Typology Code available for payment source

## 2022-08-18 ENCOUNTER — Other Ambulatory Visit: Payer: Self-pay

## 2022-08-18 ENCOUNTER — Emergency Department (HOSPITAL_COMMUNITY)
Admission: EM | Admit: 2022-08-18 | Discharge: 2022-08-18 | Disposition: A | Discharge status: Home / Self Care | Payer: No Typology Code available for payment source | Attending: Emergency Medicine | Admitting: Emergency Medicine

## 2022-08-18 ENCOUNTER — Telehealth: Payer: Self-pay

## 2022-08-18 ENCOUNTER — Encounter (HOSPITAL_COMMUNITY): Payer: Self-pay

## 2022-08-18 DIAGNOSIS — E119 Type 2 diabetes mellitus without complications: Secondary | ICD-10-CM | POA: Insufficient documentation

## 2022-08-18 DIAGNOSIS — Z7982 Long term (current) use of aspirin: Secondary | ICD-10-CM | POA: Diagnosis not present

## 2022-08-18 DIAGNOSIS — R600 Localized edema: Secondary | ICD-10-CM | POA: Diagnosis present

## 2022-08-18 DIAGNOSIS — M7989 Other specified soft tissue disorders: Secondary | ICD-10-CM

## 2022-08-18 DIAGNOSIS — I509 Heart failure, unspecified: Secondary | ICD-10-CM | POA: Diagnosis not present

## 2022-08-18 DIAGNOSIS — Z794 Long term (current) use of insulin: Secondary | ICD-10-CM | POA: Insufficient documentation

## 2022-08-18 DIAGNOSIS — Z79899 Other long term (current) drug therapy: Secondary | ICD-10-CM | POA: Insufficient documentation

## 2022-08-18 DIAGNOSIS — N185 Chronic kidney disease, stage 5: Secondary | ICD-10-CM | POA: Insufficient documentation

## 2022-08-18 DIAGNOSIS — I132 Hypertensive heart and chronic kidney disease with heart failure and with stage 5 chronic kidney disease, or end stage renal disease: Secondary | ICD-10-CM | POA: Diagnosis not present

## 2022-08-18 LAB — COMPREHENSIVE METABOLIC PANEL
ALT: 8 U/L (ref 0–44)
AST: 15 U/L (ref 15–41)
Albumin: 3.2 g/dL — ABNORMAL LOW (ref 3.5–5.0)
Alkaline Phosphatase: 50 U/L (ref 38–126)
Anion gap: 11 (ref 5–15)
BUN: 62 mg/dL — ABNORMAL HIGH (ref 8–23)
CO2: 18 mmol/L — ABNORMAL LOW (ref 22–32)
Calcium: 9.2 mg/dL (ref 8.9–10.3)
Chloride: 110 mmol/L (ref 98–111)
Creatinine, Ser: 5.05 mg/dL — ABNORMAL HIGH (ref 0.61–1.24)
GFR, Estimated: 11 mL/min — ABNORMAL LOW (ref >60)
Glucose, Bld: 187 mg/dL — ABNORMAL HIGH (ref 70–99)
Potassium: 4.3 mmol/L (ref 3.5–5.1)
Sodium: 139 mmol/L (ref 135–145)
Total Bilirubin: 0.3 mg/dL (ref 0.3–1.2)
Total Protein: 6.6 g/dL (ref 6.5–8.1)

## 2022-08-18 LAB — CBC WITH DIFFERENTIAL/PLATELET
Abs Immature Granulocytes: 0 10*3/uL (ref 0.00–0.07)
Basophils Absolute: 0 10*3/uL (ref 0.0–0.1)
Basophils Relative: 0 %
Eosinophils Absolute: 0.1 10*3/uL (ref 0.0–0.5)
Eosinophils Relative: 2 %
HCT: 24 % — ABNORMAL LOW (ref 39.0–52.0)
Hemoglobin: 7.4 g/dL — ABNORMAL LOW (ref 13.0–17.0)
Immature Granulocytes: 0 %
Lymphocytes Relative: 19 %
Lymphs Abs: 1 10*3/uL (ref 0.7–4.0)
MCH: 27.2 pg (ref 26.0–34.0)
MCHC: 30.8 g/dL (ref 30.0–36.0)
MCV: 88.2 fL (ref 80.0–100.0)
Monocytes Absolute: 0.8 10*3/uL (ref 0.1–1.0)
Monocytes Relative: 14 %
Neutro Abs: 3.3 10*3/uL (ref 1.7–7.7)
Neutrophils Relative %: 65 %
Platelets: 171 10*3/uL (ref 150–400)
RBC: 2.72 MIL/uL — ABNORMAL LOW (ref 4.22–5.81)
RDW: 14.7 % (ref 11.5–15.5)
WBC: 5.2 10*3/uL (ref 4.0–10.5)
nRBC: 0 % (ref 0.0–0.2)

## 2022-08-18 NOTE — ED Notes (Signed)
Applied an ace wrap to pt's L forearm, per provider's order.

## 2022-08-18 NOTE — ED Provider Triage Note (Signed)
Emergency Medicine Provider Triage Evaluation Note  Carlos Gill , a 82 y.o. male  was evaluated in triage. Patient had surgery for fistula creation 08/15/21.  Pt complains of left arm swelling since the surgery.  Denies any fevers or chills at home.  Review of Systems  Positive: As above Negative: As above  Physical Exam  BP (!) 145/65 (BP Location: Right Arm)   Pulse 73   Temp 97.7 F (36.5 C) (Oral)   Resp 14   SpO2 99%  Gen:   Awake, no distress   Resp:  Normal effort  MSK:   Moves extremities without difficulty  Other:  Left arm with swelling diffusely, no erythema, no rashes Surgical site covered by gauze and Tegaderm dressing  Medical Decision Making  Medically screening exam initiated at 3:12 PM.  Appropriate orders placed.  MAYCO WALROND was informed that the remainder of the evaluation will be completed by another provider, this initial triage assessment does not replace that evaluation, and the importance of remaining in the ED until their evaluation is complete.     Arabella Merles, PA-C 08/18/22 1517

## 2022-08-18 NOTE — ED Triage Notes (Signed)
Pt states that he had dialysis fistula placed in L arm Monday and that evening began noticing swelling which has continued to get worse. PMS intact. No recent fevers.

## 2022-08-18 NOTE — Discharge Instructions (Addendum)
It was a pleasure taking care of you today. As discussed, your ultrasound did not show evidence of a blood clot. I spoke to the vascular doctor while you were in the ED who recommends using an ace bandage. Keep arm elevated. Call the office tomorrow to have a recheck. Return to the ER for new or worsening symptoms.

## 2022-08-18 NOTE — Progress Notes (Signed)
Left upper extremity venous duplex has been completed. Preliminary results can be found in CV Proc through chart review.  Results were given to Claudette Stapler PA.  08/18/22 5:49 PM Olen Cordial RVT

## 2022-08-18 NOTE — ED Provider Notes (Signed)
[place] EMERGENCY DEPARTMENT AT [facility] Provider Note   CSN: [id number] Arrival date & time: 08/18/22  1414     History  Chief Complaint  Patient presents with   Post-op Problem    Carlos Gill is a 82 y.o. male with a past medical history significant for hypertension, hyperlipidemia, type 2 diabetes, CKD stage IV, and CHF who presents to the ED due to left upper extremity edema.  Patient had a fistula placed on 7/15 by Dr. Lenell Antu. He notes swelling started directly after fistula placement and has gradually worsened. Has a new blister around fistula. No injury. Denies numbness/tingling. No chest pain or shortness of breath. Denies fever and chills. Patient is awaiting to start dialysis due to CKD.    History obtained from patient and past medical records. No interpreter used during encounter.       Home Medications Prior to Admission medications   Medication Sig Start Date End Date Taking? Authorizing Provider  acetaminophen (TYLENOL) 500 MG tablet Take 1,000 mg by mouth daily as needed for moderate pain.    [provider]  aspirin EC 81 MG tablet Take 81 mg by mouth every morning.    [provider]  calcitRIOL (ROCALTROL) 0.5 MCG capsule Take 0.5 mcg by mouth 2 (two) times daily. 02/13/21   [provider]  Carboxymethylcellulose Sod PF (THERATEARS PF) 0.25 % SOLN Place 1 drop into both eyes in the morning. 12/30/20   [provider]  Continuous Glucose Sensor (FREESTYLE LIBRE 2 SENSOR) MISC as directed topically change every 14 days to monitor blood glucose continuously 06/24/21   [provider]  ezetimibe (ZETIA) 10 MG tablet Take 10 mg by mouth daily.    [provider]  ferrous sulfate 325 (65 FE) MG tablet Take 325 mg by mouth every Monday, Wednesday, and Friday. 02/13/21   [provider]  fluticasone (FLONASE) 50 MCG/ACT nasal spray Place 1 spray into both nostrils daily as needed for rhinitis.  03/09/21   [provider]  furosemide (LASIX) 20 MG tablet Take 40 mg by mouth every morning.    [provider]  gabapentin (NEURONTIN) 100 MG capsule Take 100-200 mg by mouth See admin instructions. Take 100 mg in the morning and 200 mg at bedtime 02/08/19   [provider]  gemfibrozil (LOPID) 600 MG tablet Take 600 mg by mouth 2 (two) times daily.    [provider]  hydrALAZINE (APRESOLINE) 50 MG tablet Take 50 mg by mouth 3 (three) times daily. 05/25/22   [provider]  insulin aspart (NOVOLOG) 100 UNIT/ML injection Inject 6-18 Units into the skin 3 (three) times daily with meals. Per sliding scale    [provider]  insulin glargine (LANTUS) 100 units/mL SOLN Inject 45 Units into the skin 2 (two) times daily.    [provider]  metoprolol tartrate (LOPRESSOR) 50 MG tablet Take 75-100 mg by mouth See admin instructions. Take 100 mg in the morning and 75 mg at bedtime    [provider]  Multiple Vitamins-Minerals (PRESERVISION AREDS 2) CAPS Take 1 capsule by mouth 2 (two) times daily.    [provider]  NIFEdipine (ADALAT CC) 30 MG 24 hr tablet Take 30 mg by mouth daily.    [provider]  omeprazole (PRILOSEC) 20 MG capsule Take 20 mg by mouth daily.    [provider]  oxyCODONE-acetaminophen (PERCOCET/ROXICET) 5-325 MG tablet Take 1 tablet by mouth every 6 (six) hours  as needed. 08/16/22   Lars Mage, PA-C  oxyCODONE-acetaminophen (PERCOCET/ROXICET) 5-325 MG tablet Take 1 tablet by mouth every 6 (six) hours as needed. 08/16/22   Lars Mage, PA-C  rosuvastatin (CRESTOR) 20 MG tablet Take 20 mg by mouth once a week.    [provider]  tamsulosin (FLOMAX) 0.4 MG CAPS capsule Take 0.4 mg by mouth at bedtime.    [provider]  triamcinolone ointment (KENALOG) 0.1 % Apply 1 Application topically daily as needed (rash). 01/21/21   [provider]       Allergies    Statins    Review of Systems   Review of Systems  Constitutional:  Negative for fever.  Respiratory:  Negative for shortness of breath.   Cardiovascular:  Negative for chest pain.  Musculoskeletal:  Positive for arthralgias and joint swelling.    Physical Exam Updated Vital Signs BP (!) 149/66 (BP Location: Right Arm)   Pulse 77   Temp 97.7 F (36.5 C) (Oral)   Resp 18   Ht 6' (1.829 m)   Wt 104.3 kg   SpO2 98%   BMI 31.19 kg/m  Physical Exam Vitals and nursing note reviewed.  Constitutional:      General: He is not in acute distress.    Appearance: He is not ill-appearing.  HENT:     Head: Normocephalic.  Eyes:     Pupils: Pupils are equal, round, and reactive to light.  Cardiovascular:     Rate and Rhythm: Normal rate and regular rhythm.     Pulses: Normal pulses.     Heart sounds: Normal heart sounds. No murmur heard.    No friction rub. No gallop.  Pulmonary:     Effort: Pulmonary effort is normal.     Breath sounds: Normal breath sounds.  Abdominal:     General: Abdomen is flat. There is no distension.     Palpations: Abdomen is soft.     Tenderness: There is no abdominal tenderness. There is no guarding or rebound.  Musculoskeletal:        General: Normal range of motion.     Cervical back: Neck supple.     Comments: Edema to LUE. Radial pulse intact. Blister surrounding fistula.  Skin:    General: Skin is warm and dry.  Neurological:     General: No focal deficit present.     Mental Status: He is alert.  Psychiatric:        Mood and Affect: Mood normal.        Behavior: Behavior normal.     ED Results / Procedures / Treatments   Labs (all labs ordered are listed, but only abnormal results are displayed) Labs Reviewed  CBC WITH DIFFERENTIAL/PLATELET - Abnormal; Notable for the following components:      Result Value   RBC 2.72 (*)    Hemoglobin 7.4 (*)    HCT 24.0 (*)    All other components within normal limits   COMPREHENSIVE METABOLIC PANEL - Abnormal; Notable for the following components:   CO2 18 (*)    Glucose, Bld 187 (*)    BUN 62 (*)    Creatinine, Ser 5.05 (*)    Albumin 3.2 (*)    GFR, Estimated 11 (*)    All other components within normal limits    EKG None  Radiology UE VENOUS DUPLEX (7am - 7pm)  Result Date: 08/18/2022 UPPER VENOUS STUDY  Patient Name:  Carlos Gill  Date of Exam:  08/18/2022 Medical Rec #: [id number]     Accession #:    [id number] Date of Birth: May 14, 1940     Patient Gender: M Patient Age:   55 years Exam Location:  [facility] Procedure:      VAS [place] UPPER EXTREMITY VENOUS DUPLEX Referring Phys: Claudette Stapler --------------------------------------------------------------------------------  Indications: Swelling Risk Factors: Surgery 08/16/2022 - LEFT ARM BRACHIOCEPHALIC ARTERIOVENOUS (AV) FISTULA CREATION. Limitations: Poor ultrasound/tissue interface, bandages and patient pain tolerance. Comparison Study: No prior studies. Performing Technologist: Chanda Busing RVT  Examination Guidelines: A complete evaluation includes B-mode imaging, spectral Doppler, color Doppler, and power Doppler as needed of all accessible portions of each vessel. Bilateral testing is considered an integral part of a complete examination. Limited examinations for reoccurring indications may be performed as noted.  Right Findings: +----------+------------+---------+-----------+----------+-------+ RIGHT     CompressiblePhasicitySpontaneousPropertiesSummary +----------+------------+---------+-----------+----------+-------+ Subclavian    Full       Yes       Yes                      +----------+------------+---------+-----------+----------+-------+  Left Findings: +----------+------------+---------+-----------+----------+-------+ LEFT      CompressiblePhasicitySpontaneousPropertiesSummary +----------+------------+---------+-----------+----------+-------+ IJV           Full        Yes       Yes                      +----------+------------+---------+-----------+----------+-------+ Subclavian    Full       Yes       Yes                      +----------+------------+---------+-----------+----------+-------+ Axillary      Full       Yes       Yes                      +----------+------------+---------+-----------+----------+-------+ Brachial      Full                                          +----------+------------+---------+-----------+----------+-------+ Radial        Full                                          +----------+------------+---------+-----------+----------+-------+ Ulnar         Full                                          +----------+------------+---------+-----------+----------+-------+ Cephalic      Full                                          +----------+------------+---------+-----------+----------+-------+ Basilic       Full                                          +----------+------------+---------+-----------+----------+-------+  Summary:  Right: No evidence of thrombosis in the subclavian.  Left: No evidence of deep  vein thrombosis in the upper extremity. No evidence of superficial vein thrombosis in the upper extremity.  *See table(s) above for measurements and observations.     Preliminary     Procedures Procedures    Medications Ordered in ED Medications - No data to display  ED Course/ Medical Decision Making/ A&P                             Medical Decision Making  This patient presents to the ED for concern of LUE edema, this involves an extensive number of treatment options, and is a complaint that carries with it a high risk of complications and morbidity.  The differential diagnosis includes DVT, infection, fistula complication, etc  82 year old male presents to the ED due to left upper extremity edema after fistula placement on 7/15 by Dr. Lenell Antu.  Patient has a history of CKD  awaiting to start dialysis.  No chest pain or shortness of breath.  No fever.  No injury to left arm.  Upon arrival, patient afebrile, not tachycardic or hypoxic.  Patient in no acute distress.  Significant left upper extremity edema.  Radial pulse intact. Soft compartments. Low suspicion for compartment syndrome. Blister surrounding fistula.  Routine labs ordered at triage.  Ultrasound to rule out DVT.  Patient denies any pain and declined pain medication. No evidence of infection on exam. No injury to suggest bony fracture.  CBC significant for anemia with hemoglobin at 7.4.  Baseline in the 8s.  Patient denies any bleeding.  No chest pain, shortness of breath, or lightheadedness. Low suspicion for GI bleed. CMP significant for hyperglycemia 187.  No anion gap.  Low suspicion for DKA. [place] negative for DVT.  Discussed with Dr. 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 He recommends loose ace bandage and elevation. Ace bandage placed in the ED. Advised patient to call vascular office tomorrow to schedule an appointment for further evaluation. Strict ED precautions discussed with patient. Patient states understanding and agrees to plan. Patient discharged home in no acute distress and stable vitals  Lives at home Hx CKD Has PCP  Discussed with Dr. Anitra Lauth who evaluated patient at bedside and agrees with assessment and plan.        Final Clinical Impression(s) / ED Diagnoses Final diagnoses:  Edema of left upper arm    Rx / DC Orders ED Discharge Orders     None         Jesusita Oka 08/18/22 Edwinna Areola, MD 08/22/22 1315

## 2022-08-18 NOTE — Telephone Encounter (Signed)
Pt called stating that he had surgery 2 days ago and his arm and hand are still swollen.   Reviewed pt's chart, returned call for clarification, no answer, lf vm.

## 2022-09-02 ENCOUNTER — Other Ambulatory Visit: Payer: Self-pay | Admitting: *Deleted

## 2022-09-02 DIAGNOSIS — N185 Chronic kidney disease, stage 5: Secondary | ICD-10-CM

## 2022-09-28 ENCOUNTER — Ambulatory Visit (INDEPENDENT_AMBULATORY_CARE_PROVIDER_SITE_OTHER): Payer: No Typology Code available for payment source | Admitting: Physician Assistant

## 2022-09-28 ENCOUNTER — Ambulatory Visit (HOSPITAL_COMMUNITY)
Admission: RE | Admit: 2022-09-28 | Discharge: 2022-09-28 | Disposition: A | Discharge status: Home / Self Care | Payer: No Typology Code available for payment source | Source: Ambulatory Visit | Attending: Vascular Surgery | Admitting: Vascular Surgery

## 2022-09-28 VITALS — BP 167/83 | HR 61 | Temp 98.0°F | Resp 16 | Ht 72.0 in | Wt 233.0 lb

## 2022-09-28 DIAGNOSIS — N185 Chronic kidney disease, stage 5: Secondary | ICD-10-CM

## 2022-09-28 NOTE — Progress Notes (Signed)
POST OPERATIVE OFFICE NOTE    CC:  F/u for surgery  HPI:  Carlos Gill is a 82 y.o. male who is s/p left brachiocephalic AV fistula creation on 08/16/2022 by Dr. Lenell Antu.  This was done for permanent dialysis access.  The patient has a history of CKD5 nearing ESRD.  Pt returns today for follow up. He developed a large blister on his forearm from the skin tape that was used after surgery, but this is now well healed. He has some occasional left forearm stinging pain, but this does not go into the hand. He denies any symptoms of steal such as left hand pain, weakness, numbness, or coldness.   He is not yet on dialysis and has an unknown start date.   Allergies  Allergen Reactions   Statins Other (See Comments)    Muscle aches    Current Outpatient Medications  Medication Sig Dispense Refill   acetaminophen (TYLENOL) 500 MG tablet Take 1,000 mg by mouth daily as needed for moderate pain.     aspirin EC 81 MG tablet Take 81 mg by mouth every morning.     calcitRIOL (ROCALTROL) 0.5 MCG capsule Take 0.5 mcg by mouth 2 (two) times daily.     Carboxymethylcellulose Sod PF (THERATEARS PF) 0.25 % SOLN Place 1 drop into both eyes in the morning.     Continuous Glucose Sensor (FREESTYLE LIBRE 2 SENSOR) MISC as directed topically change every 14 days to monitor blood glucose continuously     ezetimibe (ZETIA) 10 MG tablet Take 10 mg by mouth daily.     ferrous sulfate 325 (65 FE) MG tablet Take 325 mg by mouth every Monday, Wednesday, and Friday.     fluticasone (FLONASE) 50 MCG/ACT nasal spray Place 1 spray into both nostrils daily as needed for rhinitis.     furosemide (LASIX) 20 MG tablet Take 40 mg by mouth every morning.     gabapentin (NEURONTIN) 100 MG capsule Take 100-200 mg by mouth See admin instructions. Take 100 mg in the morning and 200 mg at bedtime     gemfibrozil (LOPID) 600 MG tablet Take 600 mg by mouth 2 (two) times daily.     hydrALAZINE (APRESOLINE) 50 MG tablet Take 50 mg by  mouth 3 (three) times daily.     insulin aspart (NOVOLOG) 100 UNIT/ML injection Inject 6-18 Units into the skin 3 (three) times daily with meals. Per sliding scale     insulin glargine (LANTUS) 100 units/mL SOLN Inject 45 Units into the skin 2 (two) times daily.     metoprolol tartrate (LOPRESSOR) 50 MG tablet Take 75-100 mg by mouth See admin instructions. Take 100 mg in the morning and 75 mg at bedtime     Multiple Vitamins-Minerals (PRESERVISION AREDS 2) CAPS Take 1 capsule by mouth 2 (two) times daily.     NIFEdipine (ADALAT CC) 30 MG 24 hr tablet Take 30 mg by mouth daily.     omeprazole (PRILOSEC) 20 MG capsule Take 20 mg by mouth daily.     oxyCODONE-acetaminophen (PERCOCET/ROXICET) 5-325 MG tablet Take 1 tablet by mouth every 6 (six) hours as needed. 12 tablet 0   oxyCODONE-acetaminophen (PERCOCET/ROXICET) 5-325 MG tablet Take 1 tablet by mouth every 6 (six) hours as needed. 12 tablet 0   rosuvastatin (CRESTOR) 20 MG tablet Take 20 mg by mouth once a week.     tamsulosin (FLOMAX) 0.4 MG CAPS capsule Take 0.4 mg by mouth at bedtime.     triamcinolone ointment (KENALOG)  0.1 % Apply 1 Application topically daily as needed (rash).     No current facility-administered medications for this visit.     ROS:  See HPI  Physical Exam:  Incision:  Left arm AC fossa incision well healed Extremities:  palpable left radial pulse. Left brachiocephalic fistula easily palpable in the [facility] fossa and goes slightly deeper in the upper arm. Decent thrill on exam Neuro: intact motor and sensation of LUE  Studies:  Dialysis Duplex (09/28/2022) +--------------------+----------+-----------------+--------+  AVF                PSV (cm/s)Flow Vol (mL/min)Comments  +--------------------+----------+-----------------+--------+  Native artery inflow   102           440                 +--------------------+----------+-----------------+--------+  AVF Anastomosis        308                                +--------------------+----------+-----------------+--------+     +------------+----------+-------------+----------+-------------------------  ----+  OUTFLOW VEINPSV (cm/s)Diameter (cm)Depth (cm)          Describe              +------------+----------+-------------+----------+-------------------------  ----+  Shoulder      136        0.63        1.35                                   +------------+----------+-------------+----------+-------------------------  ----+  Prox UA        108        0.68        0.47                                   +------------+----------+-------------+----------+-------------------------  ----+  Mid UA          94        0.77        0.56          Retained valve           +------------+----------+-------------+----------+-------------------------  ----+  Dist UA         90        0.85        0.61                                   +------------+----------+-------------+----------+-------------------------  ----+  AC Fossa       485        0.64        0.48   competing branch just  distal                                                to the anastomosis  measuring  0.68cm and Retained  valve       Assessment/Plan:  This is a 82 y.o. male who is s/p: left brachiocephalic fistula creation  -His left arm incision is fully healed without infection or hematoma -Duplex demonstrates a nearly matured fistula with good diameters and depth. The fistula is >53mm in size throughout the arm. Flow volume is less than ideal at 440 ml/min -On exam the fistula has a decent thrill but is harder to identify in the upper arm -He denies any symptoms of steal such as left hand pain, coldness, or numbness -I would like to give him more time to see if his fistula improves in maturity before use. He can follow-up with our office in 1 month with repeat dialysis duplex.  Hopefully  by then his fistula has improved flow volume.   Loel Dubonnet, PA-C [facility] [phone]   Clinic MD:  Steve Rattler

## 2022-10-05 ENCOUNTER — Other Ambulatory Visit (HOSPITAL_COMMUNITY): Payer: Self-pay | Admitting: Nephrology

## 2022-10-05 DIAGNOSIS — A159 Respiratory tuberculosis unspecified: Secondary | ICD-10-CM

## 2022-10-15 ENCOUNTER — Other Ambulatory Visit: Payer: Self-pay

## 2022-10-15 DIAGNOSIS — N185 Chronic kidney disease, stage 5: Secondary | ICD-10-CM

## 2022-10-17 ENCOUNTER — Other Ambulatory Visit: Payer: Self-pay

## 2022-10-17 ENCOUNTER — Emergency Department (HOSPITAL_COMMUNITY): Payer: No Typology Code available for payment source

## 2022-10-17 ENCOUNTER — Inpatient Hospital Stay (HOSPITAL_COMMUNITY)
Admission: EM | Admit: 2022-10-17 | Discharge: 2022-10-19 | DRG: 292 | Disposition: A | Discharge status: Home / Self Care | Payer: No Typology Code available for payment source | Attending: Internal Medicine | Admitting: Internal Medicine

## 2022-10-17 ENCOUNTER — Encounter (HOSPITAL_COMMUNITY): Payer: Self-pay | Admitting: *Deleted

## 2022-10-17 DIAGNOSIS — Z961 Presence of intraocular lens: Secondary | ICD-10-CM | POA: Diagnosis present

## 2022-10-17 DIAGNOSIS — I1311 Hypertensive heart and chronic kidney disease without heart failure, with stage 5 chronic kidney disease, or end stage renal disease: Secondary | ICD-10-CM | POA: Diagnosis not present

## 2022-10-17 DIAGNOSIS — D638 Anemia in other chronic diseases classified elsewhere: Secondary | ICD-10-CM | POA: Diagnosis not present

## 2022-10-17 DIAGNOSIS — Z7982 Long term (current) use of aspirin: Secondary | ICD-10-CM

## 2022-10-17 DIAGNOSIS — N185 Chronic kidney disease, stage 5: Secondary | ICD-10-CM | POA: Diagnosis present

## 2022-10-17 DIAGNOSIS — R7989 Other specified abnormal findings of blood chemistry: Secondary | ICD-10-CM | POA: Diagnosis not present

## 2022-10-17 DIAGNOSIS — Z9841 Cataract extraction status, right eye: Secondary | ICD-10-CM

## 2022-10-17 DIAGNOSIS — N138 Other obstructive and reflux uropathy: Secondary | ICD-10-CM | POA: Diagnosis present

## 2022-10-17 DIAGNOSIS — E1140 Type 2 diabetes mellitus with diabetic neuropathy, unspecified: Secondary | ICD-10-CM | POA: Diagnosis present

## 2022-10-17 DIAGNOSIS — I132 Hypertensive heart and chronic kidney disease with heart failure and with stage 5 chronic kidney disease, or end stage renal disease: Principal | ICD-10-CM | POA: Diagnosis present

## 2022-10-17 DIAGNOSIS — E441 Mild protein-calorie malnutrition: Secondary | ICD-10-CM | POA: Diagnosis present

## 2022-10-17 DIAGNOSIS — Z6831 Body mass index (BMI) 31.0-31.9, adult: Secondary | ICD-10-CM

## 2022-10-17 DIAGNOSIS — E782 Mixed hyperlipidemia: Secondary | ICD-10-CM | POA: Diagnosis present

## 2022-10-17 DIAGNOSIS — R531 Weakness: Principal | ICD-10-CM

## 2022-10-17 DIAGNOSIS — E11649 Type 2 diabetes mellitus with hypoglycemia without coma: Secondary | ICD-10-CM | POA: Diagnosis present

## 2022-10-17 DIAGNOSIS — N401 Enlarged prostate with lower urinary tract symptoms: Secondary | ICD-10-CM | POA: Diagnosis present

## 2022-10-17 DIAGNOSIS — I10 Essential (primary) hypertension: Secondary | ICD-10-CM | POA: Diagnosis not present

## 2022-10-17 DIAGNOSIS — E1165 Type 2 diabetes mellitus with hyperglycemia: Secondary | ICD-10-CM | POA: Diagnosis present

## 2022-10-17 DIAGNOSIS — Z1152 Encounter for screening for COVID-19: Secondary | ICD-10-CM | POA: Diagnosis not present

## 2022-10-17 DIAGNOSIS — I5032 Chronic diastolic (congestive) heart failure: Secondary | ICD-10-CM | POA: Diagnosis present

## 2022-10-17 DIAGNOSIS — E669 Obesity, unspecified: Secondary | ICD-10-CM | POA: Diagnosis present

## 2022-10-17 DIAGNOSIS — G629 Polyneuropathy, unspecified: Secondary | ICD-10-CM

## 2022-10-17 DIAGNOSIS — Z79899 Other long term (current) drug therapy: Secondary | ICD-10-CM

## 2022-10-17 DIAGNOSIS — K219 Gastro-esophageal reflux disease without esophagitis: Secondary | ICD-10-CM | POA: Diagnosis present

## 2022-10-17 DIAGNOSIS — E46 Unspecified protein-calorie malnutrition: Secondary | ICD-10-CM | POA: Insufficient documentation

## 2022-10-17 DIAGNOSIS — K7682 Hepatic encephalopathy: Secondary | ICD-10-CM | POA: Diagnosis present

## 2022-10-17 DIAGNOSIS — G4733 Obstructive sleep apnea (adult) (pediatric): Secondary | ICD-10-CM

## 2022-10-17 DIAGNOSIS — E1122 Type 2 diabetes mellitus with diabetic chronic kidney disease: Secondary | ICD-10-CM | POA: Diagnosis present

## 2022-10-17 DIAGNOSIS — I5031 Acute diastolic (congestive) heart failure: Secondary | ICD-10-CM | POA: Diagnosis not present

## 2022-10-17 DIAGNOSIS — Z833 Family history of diabetes mellitus: Secondary | ICD-10-CM

## 2022-10-17 DIAGNOSIS — Z87891 Personal history of nicotine dependence: Secondary | ICD-10-CM

## 2022-10-17 DIAGNOSIS — D631 Anemia in chronic kidney disease: Secondary | ICD-10-CM | POA: Diagnosis present

## 2022-10-17 DIAGNOSIS — Z9842 Cataract extraction status, left eye: Secondary | ICD-10-CM

## 2022-10-17 DIAGNOSIS — Z888 Allergy status to other drugs, medicaments and biological substances status: Secondary | ICD-10-CM

## 2022-10-17 DIAGNOSIS — E8809 Other disorders of plasma-protein metabolism, not elsewhere classified: Secondary | ICD-10-CM | POA: Diagnosis present

## 2022-10-17 DIAGNOSIS — Z794 Long term (current) use of insulin: Secondary | ICD-10-CM | POA: Diagnosis not present

## 2022-10-17 LAB — CBC WITH DIFFERENTIAL/PLATELET
Abs Immature Granulocytes: 0.02 10*3/uL (ref 0.00–0.07)
Basophils Absolute: 0 10*3/uL (ref 0.0–0.1)
Basophils Relative: 0 %
Eosinophils Absolute: 0.1 10*3/uL (ref 0.0–0.5)
Eosinophils Relative: 2 %
HCT: 24 % — ABNORMAL LOW (ref 39.0–52.0)
Hemoglobin: 7.5 g/dL — ABNORMAL LOW (ref 13.0–17.0)
Immature Granulocytes: 0 %
Lymphocytes Relative: 16 %
Lymphs Abs: 0.9 10*3/uL (ref 0.7–4.0)
MCH: 27.7 pg (ref 26.0–34.0)
MCHC: 31.3 g/dL (ref 30.0–36.0)
MCV: 88.6 fL (ref 80.0–100.0)
Monocytes Absolute: 0.5 10*3/uL (ref 0.1–1.0)
Monocytes Relative: 10 %
Neutro Abs: 4 10*3/uL (ref 1.7–7.7)
Neutrophils Relative %: 72 %
Platelets: 251 10*3/uL (ref 150–400)
RBC: 2.71 MIL/uL — ABNORMAL LOW (ref 4.22–5.81)
RDW: 14.9 % (ref 11.5–15.5)
WBC: 5.5 10*3/uL (ref 4.0–10.5)
nRBC: 0 % (ref 0.0–0.2)

## 2022-10-17 LAB — COMPREHENSIVE METABOLIC PANEL
ALT: 13 U/L (ref 0–44)
AST: 19 U/L (ref 15–41)
Albumin: 3.3 g/dL — ABNORMAL LOW (ref 3.5–5.0)
Alkaline Phosphatase: 52 U/L (ref 38–126)
Anion gap: 15 (ref 5–15)
BUN: 56 mg/dL — ABNORMAL HIGH (ref 8–23)
CO2: 19 mmol/L — ABNORMAL LOW (ref 22–32)
Calcium: 9.6 mg/dL (ref 8.9–10.3)
Chloride: 105 mmol/L (ref 98–111)
Creatinine, Ser: 4.81 mg/dL — ABNORMAL HIGH (ref 0.61–1.24)
GFR, Estimated: 11 mL/min — ABNORMAL LOW (ref >60)
Glucose, Bld: 192 mg/dL — ABNORMAL HIGH (ref 70–99)
Potassium: 4.2 mmol/L (ref 3.5–5.1)
Sodium: 139 mmol/L (ref 135–145)
Total Bilirubin: 0.9 mg/dL (ref 0.3–1.2)
Total Protein: 7.2 g/dL (ref 6.5–8.1)

## 2022-10-17 LAB — URINALYSIS, ROUTINE W REFLEX MICROSCOPIC
Bacteria, UA: NONE SEEN
Bilirubin Urine: NEGATIVE
Glucose, UA: NEGATIVE mg/dL
Hgb urine dipstick: NEGATIVE
Ketones, ur: NEGATIVE mg/dL
Leukocytes,Ua: NEGATIVE
Nitrite: NEGATIVE
Protein, ur: >=300 mg/dL — AB
Specific Gravity, Urine: 1.012 (ref 1.005–1.030)
pH: 5 (ref 5.0–8.0)

## 2022-10-17 LAB — GLUCOSE, CAPILLARY: Glucose-Capillary: 112 mg/dL — ABNORMAL HIGH (ref 70–99)

## 2022-10-17 LAB — BRAIN NATRIURETIC PEPTIDE: B Natriuretic Peptide: 1115 pg/mL — ABNORMAL HIGH (ref 0.0–100.0)

## 2022-10-17 LAB — TROPONIN I (HIGH SENSITIVITY)
Troponin I (High Sensitivity): 16 ng/L (ref <18)
Troponin I (High Sensitivity): 18 ng/L — ABNORMAL HIGH (ref <18)
Troponin I (High Sensitivity): 16 ng/L (ref <18)
Troponin I (High Sensitivity): 18 ng/L — ABNORMAL HIGH (ref <18)

## 2022-10-17 LAB — RESP PANEL BY RT-PCR (RSV, FLU A&B, COVID)  RVPGX2
Influenza A by PCR: NEGATIVE
Influenza B by PCR: NEGATIVE
Resp Syncytial Virus by PCR: NEGATIVE
SARS Coronavirus 2 by RT PCR: NEGATIVE

## 2022-10-17 LAB — LACTIC ACID, PLASMA
Lactic Acid, Venous: 1.9 mmol/L (ref 0.5–1.9)
Lactic Acid, Venous: 1.4 mmol/L (ref 0.5–1.9)

## 2022-10-17 LAB — CBG MONITORING, ED: Glucose-Capillary: 180 mg/dL — ABNORMAL HIGH (ref 70–99)

## 2022-10-17 MED ORDER — ACETAMINOPHEN 325 MG PO TABS: 650.0000 mg | ORAL_TABLET | Freq: Four times a day (QID) | ORAL | Status: DC | PRN

## 2022-10-17 MED ORDER — HYDRALAZINE HCL 20 MG/ML IJ SOLN: 10.0000 mg | Freq: Four times a day (QID) | INTRAMUSCULAR | Status: DC | PRN

## 2022-10-17 MED ORDER — GABAPENTIN 100 MG PO CAPS
200.0000 mg | ORAL_CAPSULE | Freq: Every day | ORAL | Status: DC
  Administered 2022-10-17: 200 mg via ORAL
  Filled 2022-10-17 (×2): qty 2

## 2022-10-17 MED ORDER — METOPROLOL TARTRATE 25 MG PO TABS: 75.0000 mg | ORAL_TABLET | ORAL | Status: DC

## 2022-10-17 MED ORDER — INFLUENZA VAC A&B SURF ANT ADJ 0.5 ML IM SUSY: 0.5000 mL | PREFILLED_SYRINGE | INTRAMUSCULAR | Status: DC

## 2022-10-17 MED ORDER — GABAPENTIN 100 MG PO CAPS
100.0000 mg | ORAL_CAPSULE | Freq: Every morning | ORAL | Status: DC
  Administered 2022-10-18 – 2022-10-19 (×2): 100 mg via ORAL
  Filled 2022-10-17 (×2): qty 1

## 2022-10-17 MED ORDER — GEMFIBROZIL 600 MG PO TABS
600.0000 mg | ORAL_TABLET | Freq: Two times a day (BID) | ORAL | Status: DC
  Administered 2022-10-17 – 2022-10-19 (×4): 600 mg via ORAL
  Filled 2022-10-17 (×4): qty 1

## 2022-10-17 MED ORDER — PANTOPRAZOLE SODIUM 40 MG PO TBEC
40.0000 mg | DELAYED_RELEASE_TABLET | Freq: Every day | ORAL | Status: DC
  Administered 2022-10-18 – 2022-10-19 (×2): 40 mg via ORAL
  Filled 2022-10-17 (×2): qty 1

## 2022-10-17 MED ORDER — INSULIN ASPART 100 UNIT/ML IJ SOLN: 0.0000 [IU] | Freq: Every day | INTRAMUSCULAR | Status: DC

## 2022-10-17 MED ORDER — FERROUS SULFATE 325 (65 FE) MG PO TABS
325.0000 mg | ORAL_TABLET | ORAL | Status: DC
  Administered 2022-10-18: 325 mg via ORAL
  Filled 2022-10-17: qty 1

## 2022-10-17 MED ORDER — INSULIN GLARGINE-YFGN 100 UNIT/ML ~~LOC~~ SOLN
10.0000 [IU] | Freq: Every day | SUBCUTANEOUS | Status: DC
  Administered 2022-10-17: 10 [IU] via SUBCUTANEOUS
  Filled 2022-10-17 (×2): qty 0.1

## 2022-10-17 MED ORDER — METOPROLOL TARTRATE 50 MG PO TABS
75.0000 mg | ORAL_TABLET | Freq: Every day | ORAL | Status: DC
  Administered 2022-10-17 – 2022-10-18 (×2): 75 mg via ORAL
  Filled 2022-10-17 (×2): qty 1

## 2022-10-17 MED ORDER — ONDANSETRON HCL 4 MG/2ML IJ SOLN: 4.0000 mg | Freq: Four times a day (QID) | INTRAMUSCULAR | Status: DC | PRN

## 2022-10-17 MED ORDER — GLUCERNA SHAKE PO LIQD
237.0000 mL | Freq: Three times a day (TID) | ORAL | Status: DC
  Administered 2022-10-17 – 2022-10-18 (×3): 237 mL via ORAL

## 2022-10-17 MED ORDER — GABAPENTIN 100 MG PO CAPS: 100.0000 mg | ORAL_CAPSULE | ORAL | Status: DC

## 2022-10-17 MED ORDER — METOPROLOL TARTRATE 50 MG PO TABS
100.0000 mg | ORAL_TABLET | Freq: Every morning | ORAL | Status: DC
  Administered 2022-10-18 – 2022-10-19 (×2): 100 mg via ORAL
  Filled 2022-10-17 (×2): qty 2

## 2022-10-17 MED ORDER — NIFEDIPINE ER OSMOTIC RELEASE 30 MG PO TB24
60.0000 mg | ORAL_TABLET | Freq: Every day | ORAL | Status: DC
  Administered 2022-10-18 – 2022-10-19 (×2): 60 mg via ORAL
  Filled 2022-10-17 (×2): qty 2

## 2022-10-17 MED ORDER — PNEUMOCOCCAL 20-VAL CONJ VACC 0.5 ML IM SUSY: 0.5000 mL | PREFILLED_SYRINGE | INTRAMUSCULAR | Status: DC

## 2022-10-17 MED ORDER — HEPARIN SODIUM (PORCINE) 5000 UNIT/ML IJ SOLN
5000.0000 [IU] | Freq: Three times a day (TID) | INTRAMUSCULAR | Status: DC
  Administered 2022-10-17 – 2022-10-19 (×5): 5000 [IU] via SUBCUTANEOUS
  Filled 2022-10-17 (×5): qty 1

## 2022-10-17 MED ORDER — ASPIRIN 81 MG PO TBEC
81.0000 mg | DELAYED_RELEASE_TABLET | Freq: Every morning | ORAL | Status: DC
  Administered 2022-10-18 – 2022-10-19 (×2): 81 mg via ORAL
  Filled 2022-10-17 (×2): qty 1

## 2022-10-17 MED ORDER — INSULIN ASPART 100 UNIT/ML IJ SOLN: 0.0000 [IU] | Freq: Three times a day (TID) | INTRAMUSCULAR | Status: DC

## 2022-10-17 MED ORDER — EZETIMIBE 10 MG PO TABS
10.0000 mg | ORAL_TABLET | Freq: Every day | ORAL | Status: DC
  Administered 2022-10-18 – 2022-10-19 (×2): 10 mg via ORAL
  Filled 2022-10-17 (×2): qty 1

## 2022-10-17 MED ORDER — CALCITRIOL 0.25 MCG PO CAPS
0.5000 ug | ORAL_CAPSULE | Freq: Two times a day (BID) | ORAL | Status: DC
  Administered 2022-10-17 – 2022-10-19 (×4): 0.5 ug via ORAL
  Filled 2022-10-17 (×4): qty 2

## 2022-10-17 MED ORDER — BUMETANIDE 0.25 MG/ML IJ SOLN
1.0000 mg | Freq: Once | INTRAMUSCULAR | Status: AC
  Administered 2022-10-17: 1 mg via INTRAVENOUS
  Filled 2022-10-17: qty 4

## 2022-10-17 MED ORDER — TAMSULOSIN HCL 0.4 MG PO CAPS
0.4000 mg | ORAL_CAPSULE | Freq: Every day | ORAL | Status: DC
  Administered 2022-10-17 – 2022-10-18 (×2): 0.4 mg via ORAL
  Filled 2022-10-17 (×2): qty 1

## 2022-10-17 MED ORDER — ONDANSETRON HCL 4 MG PO TABS: 4.0000 mg | ORAL_TABLET | Freq: Four times a day (QID) | ORAL | Status: DC | PRN

## 2022-10-17 MED ORDER — ACETAMINOPHEN 650 MG RE SUPP: 650.0000 mg | Freq: Four times a day (QID) | RECTAL | Status: DC | PRN

## 2022-10-17 MED ORDER — HYDRALAZINE HCL 25 MG PO TABS
100.0000 mg | ORAL_TABLET | Freq: Three times a day (TID) | ORAL | Status: DC
  Administered 2022-10-17 – 2022-10-19 (×5): 100 mg via ORAL
  Filled 2022-10-17 (×4): qty 4

## 2022-10-17 NOTE — ED Notes (Addendum)
Per pt, he does still produce urine and he does know when he has to use the bathroom. Pt given urinal and informed of need for urine specimen

## 2022-10-17 NOTE — ED Notes (Signed)
Called [facility] pertaining to medication ordered, AC to bring down medication to ER

## 2022-10-17 NOTE — ED Provider Notes (Signed)
[place] EMERGENCY DEPARTMENT AT [facility] Provider Note   CSN: [id number] Arrival date & time: 10/17/22  1340     History Chief Complaint  Patient presents with   Weakness    Carlos Gill is a 82 y.o. male. Patient with past history significant for type 2 diabetes, CKD, GERD, HTN, CHF, and chronic anemia presents to the ED with concerns of weakness. States that this has been ongoing issue for several months but worsened in the last few days. No recent fevers, chest pain, shortness of breath, nausea, vomiting, diarrhea, or urinary complaints. Does feel that it is somewhat harder to breath particularly on exertion. Denies any recent medication changes. Patient with family at bedside.   Weakness      Home Medications Prior to Admission medications   Medication Sig Start Date End Date Taking? Authorizing Provider  acetaminophen (TYLENOL) 500 MG tablet Take 1,000 mg by mouth daily as needed for moderate pain.    [provider]  aspirin EC 81 MG tablet Take 81 mg by mouth every morning.    [provider]  calcitRIOL (ROCALTROL) 0.5 MCG capsule Take 0.5 mcg by mouth 2 (two) times daily. 02/13/21   [provider]  Carboxymethylcellulose Sod PF (THERATEARS PF) 0.25 % SOLN Place 1 drop into both eyes in the morning. 12/30/20   [provider]  Continuous Glucose Sensor (FREESTYLE LIBRE 2 SENSOR) MISC as directed topically change every 14 days to monitor blood glucose continuously 06/24/21   [provider]  ezetimibe (ZETIA) 10 MG tablet Take 10 mg by mouth daily.    [provider]  ferrous sulfate 325 (65 FE) MG tablet Take 325 mg by mouth every Monday, Wednesday, and Friday. 02/13/21   [provider]  fluticasone (FLONASE) 50 MCG/ACT nasal spray Place 1 spray into both nostrils daily as needed for rhinitis. 03/09/21   [provider]  furosemide (LASIX) 20 MG tablet Take 40 mg by mouth every morning.     [provider]  gabapentin (NEURONTIN) 100 MG capsule Take 100-200 mg by mouth See admin instructions. Take 100 mg in the morning and 200 mg at bedtime 02/08/19   [provider]  gemfibrozil (LOPID) 600 MG tablet Take 600 mg by mouth 2 (two) times daily.    [provider]  hydrALAZINE (APRESOLINE) 50 MG tablet Take 100 mg by mouth 3 (three) times daily. 05/25/22   [provider]  insulin aspart (NOVOLOG) 100 UNIT/ML injection Inject 6-18 Units into the skin 3 (three) times daily with meals. Per sliding scale    [provider]  insulin glargine (LANTUS) 100 units/mL SOLN Inject 45 Units into the skin 2 (two) times daily.    [provider]  metoprolol tartrate (LOPRESSOR) 50 MG tablet Take 75-100 mg by mouth See admin instructions. Take 100 mg in the morning and 75 mg at bedtime    [provider]  Multiple Vitamins-Minerals (PRESERVISION AREDS 2) CAPS Take 1 capsule by mouth 2 (two) times daily.    [provider]  NIFEdipine (ADALAT CC) 30 MG 24 hr tablet Take 60 mg by mouth daily.    [provider]  omeprazole (PRILOSEC) 20 MG capsule Take 20 mg by mouth daily.    [provider]  oxyCODONE-acetaminophen (PERCOCET/ROXICET) 5-325 MG tablet Take 1 tablet by mouth every 6 (six) hours as needed. 08/16/22   Lars Mage, PA-C  oxyCODONE-acetaminophen (PERCOCET/ROXICET) 5-325 MG tablet Take 1 tablet by mouth  every 6 (six) hours as needed. 08/16/22   Lars Mage, PA-C  rosuvastatin (CRESTOR) 20 MG tablet Take 20 mg by mouth once a week.    [provider]  tamsulosin (FLOMAX) 0.4 MG CAPS capsule Take 0.4 mg by mouth at bedtime.    [provider]  triamcinolone ointment (KENALOG) 0.1 % Apply 1 Application topically daily as needed (rash). 01/21/21   [provider]      Allergies    Statins    Review of Systems   Review of Systems  Neurological:  Positive for weakness.  All  other systems reviewed and are negative.   Physical Exam Updated Vital Signs BP (!) 175/71   Pulse 70   Temp 98.4 F (36.9 C)   Resp 20   Ht 6' (1.829 m)   Wt 105.2 kg   SpO2 100%   BMI 31.46 kg/m  Physical Exam Vitals and nursing note reviewed.  Constitutional:      General: He is not in acute distress.    Appearance: He is well-developed.  HENT:     Head: Normocephalic and atraumatic.  Eyes:     Conjunctiva/sclera: Conjunctivae normal.  Cardiovascular:     Rate and Rhythm: Normal rate and regular rhythm.     Heart sounds: No murmur heard. Pulmonary:     Effort: Pulmonary effort is normal. No respiratory distress.     Breath sounds: Normal breath sounds.  Abdominal:     Palpations: Abdomen is soft.     Tenderness: There is no abdominal tenderness.  Musculoskeletal:        General: No swelling.     Cervical back: Neck supple.     Right lower leg: 1+ Edema present.     Left lower leg: 1+ Edema present.     Comments: Bilateral trace edema, wearing compression stockings  Skin:    General: Skin is warm and dry.     Capillary Refill: Capillary refill takes less than 2 seconds.  Neurological:     Mental Status: He is lethargic.     Cranial Nerves: Cranial nerves 2-12 are intact.     Sensory: Sensation is intact.  Psychiatric:        Mood and Affect: Mood normal.     ED Results / Procedures / Treatments   Labs (all labs ordered are listed, but only abnormal results are displayed) Labs Reviewed  CBC WITH DIFFERENTIAL/PLATELET - Abnormal; Notable for the following components:      Result Value   RBC 2.71 (*)    Hemoglobin 7.5 (*)    HCT 24.0 (*)    All other components within normal limits  COMPREHENSIVE METABOLIC PANEL - Abnormal; Notable for the following components:   CO2 19 (*)    Glucose, Bld 192 (*)    BUN 56 (*)    Creatinine, Ser 4.81 (*)    Albumin 3.3 (*)    GFR, Estimated 11 (*)    All other components within normal limits  URINALYSIS, ROUTINE W  REFLEX MICROSCOPIC - Abnormal; Notable for the following components:   Protein, ur >=300 (*)    All other components within normal limits  BRAIN NATRIURETIC PEPTIDE - Abnormal; Notable for the following components:   B Natriuretic Peptide 1,115.0 (*)    All other components within normal limits  GLUCOSE, CAPILLARY - Abnormal; Notable for the following components:   Glucose-Capillary 112 (*)    All other components within normal limits  CBG MONITORING, ED - Abnormal;  Notable for the following components:   Glucose-Capillary 180 (*)    All other components within normal limits  TROPONIN I (HIGH SENSITIVITY) - Abnormal; Notable for the following components:   Troponin I (High Sensitivity) 18 (*)    All other components within normal limits  TROPONIN I (HIGH SENSITIVITY) - Abnormal; Notable for the following components:   Troponin I (High Sensitivity) 18 (*)    All other components within normal limits  RESP PANEL BY RT-PCR (RSV, FLU A&B, COVID)  RVPGX2  LACTIC ACID, PLASMA  LACTIC ACID, PLASMA  HEMOGLOBIN A1C  COMPREHENSIVE METABOLIC PANEL  CBC  MAGNESIUM  PHOSPHORUS  TROPONIN I (HIGH SENSITIVITY)  TROPONIN I (HIGH SENSITIVITY)    EKG None  Radiology CT Head Wo Contrast  Result Date: 10/17/2022 CLINICAL DATA:  Mental status change. Generalized weakness. Dizziness. EXAM: CT HEAD WITHOUT CONTRAST TECHNIQUE: Contiguous axial images were obtained from the base of the skull through the vertex without intravenous contrast. RADIATION DOSE REDUCTION: This exam was performed according to the departmental dose-optimization program which includes automated exposure control, adjustment of the mA and/or kV according to patient size and/or use of iterative reconstruction technique. COMPARISON:  None Available. FINDINGS: Brain: No evidence of acute infarction, hemorrhage, hydrocephalus, or mass lesion/mass effect. 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 There is mild diffuse low-attenuation within the subcortical and periventricular white matter compatible with chronic microvascular disease. Vascular: No hyperdense vessel or unexpected calcification. Skull: Normal. Negative for fracture or focal lesion. Sinuses/Orbits: Asymmetric opacification of the left mastoid air cells. Paranasal sinuses are clear. Other: None. IMPRESSION: 1. No acute intracranial abnormalities. 2. Chronic microvascular disease. 3. 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 4. Left mastoid air cell effusion Electronically Signed   By: Signa Kell M.D.   On: 10/17/2022 15:12   DG Chest 1 View  Result Date: 10/17/2022 CLINICAL DATA:  Weakness. EXAM: CHEST  1 VIEW COMPARISON:  05/03/2022 and older exams. FINDINGS: Cardiac silhouette borderline enlarged. No mediastinal or hilar masses. Clear lungs.  No pleural effusion or pneumothorax. Skeletal structures are grossly intact. IMPRESSION: No acute cardiopulmonary disease. Electronically Signed   By: Amie Portland M.D.   On: 10/17/2022 15:04    Procedures Procedures   Medications Ordered in ED Medications  pneumococcal 20-valent conjugate vaccine (PREVNAR 20) injection 0.5 mL (has no administration in time range)  influenza vaccine adjuvanted (FLUAD) injection 0.5 mL (has no administration in time range)  hydrALAZINE (APRESOLINE) injection 10 mg (has no administration in time range)  feeding supplement (GLUCERNA SHAKE) (GLUCERNA SHAKE) liquid 237 mL (237 mLs Oral Given 10/17/22 2038)  insulin glargine-yfgn (SEMGLEE) injection 10 Units (10 Units Subcutaneous Given 10/17/22 2242)  insulin aspart (novoLOG) injection 0-6 Units (has no administration in time range)  insulin aspart (novoLOG) injection 0-5 Units ( Subcutaneous Not Given 10/17/22 2202)  heparin injection 5,000 Units (5,000 Units Subcutaneous Given 10/17/22 2037)  acetaminophen (TYLENOL) tablet 650 mg (has no administration  in time range)    Or  acetaminophen (TYLENOL) suppository 650 mg (has no administration in time range)  ondansetron (ZOFRAN) tablet 4 mg (has no administration in time range)    Or  ondansetron (ZOFRAN) injection 4 mg (has no administration in time range)  aspirin EC tablet 81 mg (has no administration in time range)  ezetimibe (ZETIA) tablet 10 mg (has no administration in time range)  gemfibrozil (LOPID) tablet 600 mg (600 mg Oral Given 10/17/22 2211)  hydrALAZINE (  APRESOLINE) tablet 100 mg (100 mg Oral Given 10/17/22 2210)  NIFEdipine (PROCARDIA-XL/NIFEDICAL-XL) 24 hr tablet 60 mg (has no administration in time range)  calcitRIOL (ROCALTROL) capsule 0.5 mcg (0.5 mcg Oral Given 10/17/22 2211)  pantoprazole (PROTONIX) EC tablet 40 mg (has no administration in time range)  tamsulosin (FLOMAX) capsule 0.4 mg (0.4 mg Oral Given 10/17/22 2211)  ferrous sulfate tablet 325 mg (has no administration in time range)  metoprolol tartrate (LOPRESSOR) tablet 100 mg (has no administration in time range)    And  metoprolol tartrate (LOPRESSOR) tablet 75 mg (75 mg Oral Given 10/17/22 2210)  gabapentin (NEURONTIN) capsule 100 mg (has no administration in time range)    And  gabapentin (NEURONTIN) capsule 200 mg (200 mg Oral Given 10/17/22 2211)  bumetanide (BUMEX) injection 1 mg (1 mg Intravenous Given 10/17/22 1752)    ED Course/ Medical Decision Making/ A&P                               Medical Decision Making Amount and/or Complexity of Data Reviewed Labs: ordered. Radiology: ordered.  Risk Prescription drug management. Decision regarding hospitalization.   This patient presents to the ED for concern of weakness.  Differential diagnosis includes viral URI, pneumonia, sepsis, UTI, CHF exacerbation   Lab Tests:  I Ordered, and personally interpreted labs.  The pertinent results include:  CBC with baseline anemia with RBC at 2.71, hemoglobin at 7.5, and HCT at 24, CMP with poor renal function  with BUN at 56, creatinine at 4.81, and GFR at 11, UA without signs of infection but significant protein, troponin at 16 and 18, respiratory panel negative, lactic at 1.4   Imaging Studies ordered:  I ordered imaging studies including chest x-ray, CT head I independently visualized and interpreted imaging which showed no acute cardiopulmonary process, no intracranial abnormality I agree with the radiologist interpretation   Medicines ordered and prescription drug management:  I ordered medication including Bumex  for fluid overload  Reevaluation of the patient after these medicines showed that the patient improved I have reviewed the patients home medicines and have made adjustments as needed   Problem List / ED Course:  Patient presents emergency department concerns of weakness.  Reports has been ongoing issue for several months but worse over the last few days.  Patient's wife reported that patient was having hard time even getting out of bed except to use the bathroom.  Patient reports he still eating and drinking without significant changes.  No recent fevers, nausea, vomiting, diarrhea, abdominal pain.  Also currently denies chest pain.  Does report some increased shortness of breath typically worse with exertion patient has a history of CHF.  Given presentation and age, will evaluate with extensive and broad workup but patient not appearing to be acutely septic. Patient's lab largely at baseline but BNP elevated at 1115. Suspect this is likely cause of increasing weakness and worsening SOB. Patient also with history of CKD which could be explaining elevated BNP. Otherwise not acute or focal abnormality. Patient at baseline anemia levels. No signs of pneumonia, bronchitis, or cardiopulmonary process. No evidence of pulmonary edema but very minimal enlargement of cardiac margins but no true cardiomegaly. Will plan on admission given worsening SOB, suspect CHF exacerbation in the setting of  worsening renal function. Spoke with Dr. Thomes Dinning, hospitalist, who will be admitting patient. Patient agreeable with this plan for admission at this time.   Final Clinical Impression(s) /  ED Diagnoses Final diagnoses:  Generalized weakness    Rx / DC Orders ED Discharge Orders     None         Salomon Mast 10/17/22 2340    Benjiman Core, MD 10/18/22 1544

## 2022-10-17 NOTE — ED Notes (Signed)
Per family member and caregiver at bedside, pt does not know when he has to go to the bathroom, incontinent

## 2022-10-17 NOTE — ED Triage Notes (Signed)
Pt with generalized weakness x month off and on.  + dizziness per wife. Wife states pt is sleeping more than usual.

## 2022-10-17 NOTE — Progress Notes (Signed)
Carlos Gill declined use of CPAP. States he does not wear at home.

## 2022-10-17 NOTE — ED Notes (Signed)
Patient transported to CT 

## 2022-10-17 NOTE — ED Notes (Signed)
Restricted extremity armband placed on L wrist

## 2022-10-17 NOTE — H&P (Signed)
History and Physical    Patient: Carlos Gill GMW:[id number] DOB: 03-26-1940 DOA: 10/17/2022 DOS: the patient was seen and examined on 10/17/2022 PCP: Geoffry Paradise, MD  Patient coming from: Home  Chief Complaint:  Chief Complaint  Patient presents with   Weakness   HPI: Carlos Gill is an 82 y.o. male with medical history significant of hypertension, hyperlipidemia, type 2 diabetes mellitus, OSA on CPAP, CKD stage V who presents to the emergency department from home due to generalized weakness which has been ongoing for a few months, but which worsened within the last 48 hours.  Apparently, patient has anemia of chronic disease and has been receiving Epoeitin.  Daughter (over the phone) states that patient has been feeling weak and has been having occasional dizziness. He was noted to easily get tired by just walking a little within the house in the last 2 weeks and has spent most of the last week in bed due to weakness.  Patient complained of increased leg swelling during this time. He denies chest pain, fever, chills, nausea, vomiting, abdominal pain or diarrhea.  ED Course:  In the emergency department, BP was 140/57 on arrival to the ED and other vital signs were within normal range.  Workup in the ED showed WBC 5.5, hemoglobin 7.5, hematocrit 24.0, MCV 88.6, platelets 251.  CMP was normal except for bicarb of 19, blood glucose 192, BUN 56, creatinine 4.81, albumin 3.3, EGFR 11.  BNP 1115, urinalysis was normal except for proteinuria, lactic acid was normal, troponin 16 > 18.  Influenza A, B, RSV and SARS coronavirus 2 was negative. Chest x-ray showed no acute cardiopulmonary disease CT head without contrast showed no acute intracranial abnormalities. Patient was treated with 1 mg of IV Bumex.  Hospitalist was asked to admit patient for further evaluation and management.  Review of Systems: Review of systems as noted in the HPI. All other systems reviewed and are negative.   Past  Medical History:  Diagnosis Date   Abnormal result of other cardiovascular function study 04/21/2021   Abnormality of plasma protein, unspecified 04/21/2021   Acute kidney failure, unspecified (HCC) 04/21/2021   Acute on chronic diastolic (congestive) heart failure (HCC) 04/21/2021   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   Adverse effect of antihyperlipidemic and antiarteriosclerotic drugs, initial encounter 12/19/2017   Benign prostatic hyperplasia with urinary obstruction 04/21/2021   Formatting of this note might be different from the original. Last Assessment & Plan:  Formatting of this note is different from the original.  - We will continue Flomax.   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   Obesity 04/11/2017   Osteoarthritis 11/11/2008   Other chest pain 04/21/2021   Proteinuria 01/21/2021   Rhinitis, chronic 03/09/2021   Shortness of breath 04/21/2021   Sleep apnea    cpap   Trochanteric bursitis of right hip 04/27/2017   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   Past Surgical History:  Procedure Laterality Date   ACHILLES TENDON REPAIR Left 2014   AV FISTULA PLACEMENT  Left 08/16/2022   Procedure: LEFT ARM  BRACHIOCEPHALIC ARTERIOVENOUS (AV) FISTULA CREATION;  Surgeon: Leonie Douglas, MD;  Location: [facility] OR;  Service: Vascular;  Laterality: Left;  with regional block   CATARACT EXTRACTION W/ INTRAOCULAR LENS  IMPLANT, BILATERAL  2011    Social History:   reports that he quit smoking about 32 years ago. His smoking use included cigarettes. He has never used smokeless tobacco. He reports that he does not drink alcohol and does not use drugs.   Allergies  Allergen Reactions   Statins Other (See Comments)    Muscle aches    Family History  Problem Relation Age of Onset   Diabetes Mother    Prostate cancer Maternal Grandfather    Colon cancer Neg Hx      Prior to Admission medications   Medication Sig Start Date End Date Taking? Authorizing Provider  acetaminophen (TYLENOL) 500 MG tablet Take 1,000 mg by mouth daily as needed for moderate pain.    [provider]  aspirin EC 81 MG tablet Take 81 mg by mouth every morning.    [provider]  calcitRIOL (ROCALTROL) 0.5 MCG capsule Take 0.5 mcg by mouth 2 (two) times daily. 02/13/21   [provider]  Carboxymethylcellulose Sod PF (THERATEARS PF) 0.25 % SOLN Place 1 drop into both eyes in the morning. 12/30/20   [provider]  Continuous Glucose Sensor (FREESTYLE LIBRE 2 SENSOR) MISC as directed topically change every 14 days to monitor blood glucose continuously 06/24/21   [provider]  ezetimibe (ZETIA) 10 MG tablet Take 10 mg by mouth daily.    [provider]  ferrous sulfate 325 (65 FE) MG tablet Take 325 mg by mouth every Monday, Wednesday, and Friday. 02/13/21   [provider]  fluticasone (FLONASE) 50 MCG/ACT nasal spray Place 1 spray into both nostrils daily as needed for rhinitis. 03/09/21   [provider]  furosemide (LASIX) 20 MG tablet Take 40 mg by mouth every morning.    [provider]  gabapentin (NEURONTIN) 100 MG capsule Take 100-200 mg by mouth See admin instructions. Take 100 mg in the morning and 200 mg at bedtime 02/08/19   [provider]  gemfibrozil (LOPID) 600 MG tablet Take 600 mg by mouth 2 (two) times daily.    [provider]  hydrALAZINE (APRESOLINE) 50 MG tablet  Take 100 mg by mouth 3 (three) times daily. 05/25/22   [provider]  insulin aspart (NOVOLOG) 100 UNIT/ML injection Inject 6-18 Units into the skin 3 (three) times daily with meals. Per sliding scale    [provider]  insulin glargine (LANTUS) 100 units/mL SOLN Inject 45 Units into the skin 2 (two) times daily.    [provider]  metoprolol tartrate (LOPRESSOR) 50 MG tablet Take 75-100 mg by mouth See admin instructions. Take 100 mg in the morning and 75 mg at bedtime    [provider]  Multiple Vitamins-Minerals (PRESERVISION AREDS 2) CAPS Take 1 capsule by mouth 2 (two) times daily.    [provider]  NIFEdipine (ADALAT CC) 30 MG 24 hr tablet Take 60 mg by mouth daily.    [provider]  omeprazole (PRILOSEC) 20 MG capsule Take 20 mg by mouth daily.    [provider]  oxyCODONE-acetaminophen (PERCOCET/ROXICET) 5-325 MG tablet Take 1 tablet by mouth every 6 (six) hours as needed. 08/16/22   Lars Mage, PA-C  oxyCODONE-acetaminophen (PERCOCET/ROXICET) 5-325 MG tablet Take 1  tablet by mouth every 6 (six) hours as needed. 08/16/22   Lars Mage, PA-C  rosuvastatin (CRESTOR) 20 MG tablet Take 20 mg by mouth once a week.    [provider]  tamsulosin (FLOMAX) 0.4 MG CAPS capsule Take 0.4 mg by mouth at bedtime.    [provider]  triamcinolone ointment (KENALOG) 0.1 % Apply 1 Application topically daily as needed (rash). 01/21/21   [provider]    Physical Exam: BP (!) 193/78 (BP Location: Right Arm)   Pulse 72   Temp 97.8 F (36.6 C) (Oral)   Resp 16   Ht 6' (1.829 m)   Wt 105.2 kg   SpO2 99%   BMI 31.46 kg/m   General: 82 y.o. year-old male well developed well nourished in no acute distress.  Alert and oriented x3. HEENT: NCAT, EOMI Neck: Supple, trachea medial Cardiovascular: Regular rate and rhythm with no rubs or gallops.  No thyromegaly or JVD noted.  +2 lower extremity edema  bilaterally. 2/4 pulses in all 4 extremities. Respiratory: Clear to auscultation with no wheezes or rales. Good inspiratory effort. Abdomen: Soft, nontender nondistended with normal bowel sounds x4 quadrants. Muskuloskeletal: Left-sided AV fistula with normal thrill noted.  No cyanosis, clubbing noted bilaterally Neuro: CN II-XII intact, strength 5/5 x 4, sensation, reflexes intact Skin: No ulcerative lesions noted or rashes Psychiatry: Mood is appropriate for condition and setting          Labs on Admission:  Basic Metabolic Panel: Recent Labs  Lab 10/17/22 1513  NA 139  K 4.2  CL 105  CO2 19*  GLUCOSE 192*  BUN 56*  CREATININE 4.81*  CALCIUM 9.6   Liver Function Tests: Recent Labs  Lab 10/17/22 1513  AST 19  ALT 13  ALKPHOS 52  BILITOT 0.9  PROT 7.2  ALBUMIN 3.3*   No results for input(s): "LIPASE", "AMYLASE" in the last 168 hours. No results for input(s): "AMMONIA" in the last 168 hours. CBC: Recent Labs  Lab 10/17/22 1513  WBC 5.5  NEUTROABS 4.0  HGB 7.5*  HCT 24.0*  MCV 88.6  PLT 251   Cardiac Enzymes: No results for input(s): "CKTOTAL", "CKMB", "CKMBINDEX", "TROPONINI" in the last 168 hours.  BNP (last 3 results) Recent Labs    10/17/22 1513  BNP 1,115.0*    ProBNP (last 3 results) No results for input(s): "PROBNP" in the last 8760 hours.  CBG: Recent Labs  Lab 10/17/22 1447  GLUCAP 180*    Radiological Exams on Admission: CT Head Wo Contrast  Result Date: 10/17/2022 CLINICAL DATA:  Mental status change. Generalized weakness. Dizziness. EXAM: CT HEAD WITHOUT CONTRAST TECHNIQUE: Contiguous axial images were obtained from the base of the skull through the vertex without intravenous contrast. RADIATION DOSE REDUCTION: This exam was performed according to the departmental dose-optimization program which includes automated exposure control, adjustment of the mA and/or kV according to patient size and/or use of iterative reconstruction technique.  COMPARISON:  None Available. FINDINGS: Brain: No evidence of acute infarction, hemorrhage, hydrocephalus, or mass lesion/mass effect. 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 There is mild diffuse low-attenuation within the subcortical and periventricular white matter compatible with chronic microvascular disease. Vascular: No hyperdense vessel or unexpected calcification. Skull: Normal. Negative for fracture or focal lesion. Sinuses/Orbits: Asymmetric opacification of the left mastoid air cells. Paranasal sinuses are clear. Other: None. IMPRESSION: 1. No acute intracranial abnormalities. 2. Chronic microvascular disease. 3. 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 4. Left mastoid air cell effusion Electronically Signed   By: Signa Kell M.D.   On: 10/17/2022 15:12   DG Chest 1 View  Result Date: 10/17/2022 CLINICAL DATA:  Weakness. EXAM: CHEST  1 VIEW COMPARISON:  05/03/2022 and older exams. FINDINGS: Cardiac silhouette borderline enlarged. No mediastinal or hilar masses. Clear lungs.  No pleural effusion or pneumothorax. Skeletal structures are grossly intact. IMPRESSION: No acute cardiopulmonary disease. Electronically Signed   By: Amie Portland M.D.   On: 10/17/2022 15:04    EKG: I independently viewed the EKG done and my findings are as followed: Normal sinus rhythm at a rate of 65 bpm with LBBB.  Assessment/Plan Present on Admission:  Benign prostatic hyperplasia with urinary obstruction  Essential hypertension  Obesity (BMI 30-39.9)  GERD without esophagitis  Principal Problem:   Generalized weakness Active Problems:   Essential hypertension   Benign prostatic hyperplasia with urinary obstruction   GERD without esophagitis   Uncontrolled type 2 diabetes mellitus with hyperglycemia, with long-term current use of insulin (HCC)   Obesity (BMI 30-39.9)   Anemia of chronic disease   Elevated brain  natriuretic peptide (BNP) level   Chronic kidney disease, stage V (HCC)   Mixed hyperlipidemia   Hypoalbuminemia due to protein-calorie malnutrition (HCC)   OSA on CPAP   Elevated troponin   Peripheral neuropathy  Generalized weakness possibly due to multifactorial Patient generalized weakness was thought to be due to history of chronic anemia.  CHF is also a differential diagnosis due to increased bilateral lower extremities edema  Anemia of chronic disease Hemoglobin at 7.5 this was 8.8 on 08/16/2022.  Patient denies any GI bleed or any known acute blood loss. Consider blood transfusion for hemoglobin < 7.0 or with current hemoglobin level if symptoms persist s/p echocardiogram rule out of CHF for symptomatic anemia Continue ferrous sulfate Continue to monitor H/H  Elevated BNP rule out CHF BNP 1,115, this may still be due to patient's history of CKD stage V, however, it will be reasonable to rule out CHF considering increased bilateral lower extremities with increased dyspnea on exertion. Continue total input/output, daily weights and fluid restriction Continue heart healthy/modified carb diet  Echocardiogram in the morning   CKD stage V Patient with fistula in left upper extremity, this is yet to be used as he has not started dialysis Patient endorsed still being able to urinate Renally adjust medications, avoid nephrotoxic agents/dehydration/hypotension Continue calcitriol Nephrology will be consulted and we shall await further recommendations  Hypoalbuminemia possibly secondary to mild protein calorie malnutrition Albumin 3.3, protein supplement will be provided  Elevated troponin possibly secondary to type II demand ischemia Troponin 16 > 18, patient denies chest pain, continue to monitor troponin level  Type 2 diabetes mellitus with hyperglycemia Continue Semglee 10 units and adjust dose accordingly Continue ISS and hypoglycemia protocol  Obesity (BMI 31.46) Diet and  lifestyle modification  Essential hypertension (uncontrolled) Continue hydralazine, Lopressor, nifedipine  Mixed hyperlipidemia Continue Zetia and Lopid  GERD Continue Protonix  BPH Continue Flomax  Peripheral neuropathy Continue gabapentin  OSA on CPAP Continue CPAP  DVT prophylaxis: Heparin subcu  Advance Care Planning: CODE STATUS: Full code  Consults: .Nephrology  Family Communication: Daughters at bedside and on phone (all questions answered to satisfaction)  Severity of Illness: The appropriate patient status for this patient is INPATIENT. Inpatient status is judged to be reasonable and necessary in order to provide the required intensity of service to ensure the patient's safety.  The patient's presenting symptoms, physical exam findings, and initial radiographic and laboratory data in the context of their chronic comorbidities is felt to place them at high risk for further clinical deterioration. Furthermore, it is not anticipated that the patient will be medically stable for discharge from the hospital within 2 midnights of admission.   * I certify that at the point of admission it is my clinical judgment that the patient will require inpatient hospital care spanning beyond 2 midnights from the point of admission due to high intensity of service, high risk for further deterioration and high frequency of surveillance required.*  Author: Frankey Shown, DO 10/17/2022 8:07 PM  For on call review [email or web address].

## 2022-10-17 NOTE — ED Notes (Signed)
Pt able to use urinal without assistance

## 2022-10-18 ENCOUNTER — Inpatient Hospital Stay (HOSPITAL_COMMUNITY): Payer: No Typology Code available for payment source

## 2022-10-18 ENCOUNTER — Other Ambulatory Visit (HOSPITAL_COMMUNITY): Payer: Self-pay | Admitting: *Deleted

## 2022-10-18 DIAGNOSIS — I5031 Acute diastolic (congestive) heart failure: Secondary | ICD-10-CM | POA: Diagnosis not present

## 2022-10-18 DIAGNOSIS — R531 Weakness: Secondary | ICD-10-CM | POA: Diagnosis not present

## 2022-10-18 LAB — BLOOD GAS, VENOUS
Acid-base deficit: 1 mmol/L (ref 0.0–2.0)
Bicarbonate: 23.5 mmol/L (ref 20.0–28.0)
Drawn by: 1528
O2 Saturation: 72 %
Patient temperature: 36.9
pCO2, Ven: 37 mmHg — ABNORMAL LOW (ref 44–60)
pH, Ven: 7.41 (ref 7.25–7.43)
pO2, Ven: 39 mmHg (ref 32–45)

## 2022-10-18 LAB — COMPREHENSIVE METABOLIC PANEL
ALT: 11 U/L (ref 0–44)
AST: 16 U/L (ref 15–41)
Albumin: 3.1 g/dL — ABNORMAL LOW (ref 3.5–5.0)
Alkaline Phosphatase: 49 U/L (ref 38–126)
Anion gap: 14 (ref 5–15)
BUN: 53 mg/dL — ABNORMAL HIGH (ref 8–23)
CO2: 22 mmol/L (ref 22–32)
Calcium: 9.5 mg/dL (ref 8.9–10.3)
Chloride: 106 mmol/L (ref 98–111)
Creatinine, Ser: 4.67 mg/dL — ABNORMAL HIGH (ref 0.61–1.24)
GFR, Estimated: 12 mL/min — ABNORMAL LOW (ref >60)
Glucose, Bld: 62 mg/dL — ABNORMAL LOW (ref 70–99)
Potassium: 3.7 mmol/L (ref 3.5–5.1)
Sodium: 142 mmol/L (ref 135–145)
Total Bilirubin: 0.9 mg/dL (ref 0.3–1.2)
Total Protein: 6.6 g/dL (ref 6.5–8.1)

## 2022-10-18 LAB — GLUCOSE, CAPILLARY
Glucose-Capillary: 56 mg/dL — ABNORMAL LOW (ref 70–99)
Glucose-Capillary: 137 mg/dL — ABNORMAL HIGH (ref 70–99)
Glucose-Capillary: 150 mg/dL — ABNORMAL HIGH (ref 70–99)
Glucose-Capillary: 133 mg/dL — ABNORMAL HIGH (ref 70–99)
Glucose-Capillary: 199 mg/dL — ABNORMAL HIGH (ref 70–99)

## 2022-10-18 LAB — AMMONIA
Ammonia: 75 umol/L — ABNORMAL HIGH (ref 9–35)
Ammonia: 38 umol/L — ABNORMAL HIGH (ref 9–35)

## 2022-10-18 LAB — MAGNESIUM
Magnesium: 2 mg/dL (ref 1.7–2.4)
Magnesium: 2.1 mg/dL (ref 1.7–2.4)

## 2022-10-18 LAB — CBC
HCT: 23.3 % — ABNORMAL LOW (ref 39.0–52.0)
Hemoglobin: 7 g/dL — ABNORMAL LOW (ref 13.0–17.0)
MCH: 26.8 pg (ref 26.0–34.0)
MCHC: 30 g/dL (ref 30.0–36.0)
MCV: 89.3 fL (ref 80.0–100.0)
Platelets: 225 10*3/uL (ref 150–400)
RBC: 2.61 MIL/uL — ABNORMAL LOW (ref 4.22–5.81)
RDW: 14.9 % (ref 11.5–15.5)
WBC: 4.9 10*3/uL (ref 4.0–10.5)
nRBC: 0 % (ref 0.0–0.2)

## 2022-10-18 LAB — ECHOCARDIOGRAM COMPLETE
AR max vel: 1.73 cm2
AV Area VTI: 1.62 cm2
AV Area mean vel: 1.79 cm2
AV Mean grad: 8 mmHg
AV Peak grad: 16.8 mmHg
Ao pk vel: 2.05 m/s
Area-P 1/2: 3.53 cm2
Height: 72 in
S' Lateral: 3.4 cm
Weight: 3495.61 [oz_av]

## 2022-10-18 LAB — PHOSPHORUS: Phosphorus: 4.2 mg/dL (ref 2.5–4.6)

## 2022-10-18 MED ORDER — FUROSEMIDE 40 MG PO TABS
40.0000 mg | ORAL_TABLET | Freq: Every morning | ORAL | Status: DC
  Administered 2022-10-19: 40 mg via ORAL
  Filled 2022-10-18: qty 1

## 2022-10-18 MED ORDER — METOPROLOL TARTRATE 5 MG/5ML IV SOLN: 5.0000 mg | INTRAVENOUS | Status: DC | PRN

## 2022-10-18 MED ORDER — IPRATROPIUM-ALBUTEROL 0.5-2.5 (3) MG/3ML IN SOLN: 3.0000 mL | RESPIRATORY_TRACT | Status: DC | PRN

## 2022-10-18 MED ORDER — OXYCODONE-ACETAMINOPHEN 5-325 MG PO TABS
1.0000 | ORAL_TABLET | Freq: Four times a day (QID) | ORAL | Status: DC | PRN
  Administered 2022-10-18: 1 via ORAL
  Filled 2022-10-18: qty 1

## 2022-10-18 MED ORDER — HYDRALAZINE HCL 20 MG/ML IJ SOLN
10.0000 mg | INTRAMUSCULAR | Status: DC | PRN
  Administered 2022-10-18: 10 mg via INTRAVENOUS
  Filled 2022-10-18: qty 1

## 2022-10-18 MED ORDER — GUAIFENESIN 100 MG/5ML PO LIQD: 5.0000 mL | ORAL | Status: DC | PRN

## 2022-10-18 MED ORDER — LACTULOSE 10 GM/15ML PO SOLN
20.0000 g | Freq: Two times a day (BID) | ORAL | Status: DC
  Administered 2022-10-18 – 2022-10-19 (×3): 20 g via ORAL
  Filled 2022-10-18 (×3): qty 30

## 2022-10-18 MED ORDER — TRAZODONE HCL 50 MG PO TABS: 50.0000 mg | ORAL_TABLET | Freq: Every evening | ORAL | Status: DC | PRN

## 2022-10-18 MED ORDER — SENNOSIDES-DOCUSATE SODIUM 8.6-50 MG PO TABS: 1.0000 | ORAL_TABLET | Freq: Every evening | ORAL | Status: DC | PRN

## 2022-10-18 NOTE — Progress Notes (Signed)
Ambulated in hall with assistance this shift walking about 200 feet, also ambulated in room and sat in chair for 2 hours. No events this shift.

## 2022-10-18 NOTE — TOC CM/SW Note (Signed)
Transition of Care [facility]) - Inpatient Brief Assessment   Patient Details  Name: JEFERSON REYMOND MRN: [id number] Date of Birth: [birth year]  [facility]) CM/SW Contact:    Villa Herb, LCSWA Phone Number: 10/18/2022, 10:05 AM   Clinical Narrative: [facility] Notification ID is [id number].   Transition of Care Department [facility]) has reviewed patient and no TOC needs have been identified at this time. We will continue to monitor patient advancement through interdisciplinary progression rounds. If new patient transition needs arise, please place a TOC consult.  Transition of Care Asessment: Insurance and Status: Insurance coverage has been reviewed Patient has primary care physician: Yes Home environment has been reviewed: from home Prior level of function:: independent Prior/Current Home Services: No current home services Social Determinants of Health Reivew: SDOH reviewed no interventions necessary Readmission risk has been reviewed: Yes Transition of care needs: no transition of care needs at this time

## 2022-10-18 NOTE — Progress Notes (Signed)
Patient CBG 56. Patient given two cups of orange juice and a pack of peanut butter crackers.

## 2022-10-18 NOTE — Progress Notes (Signed)
*  PRELIMINARY RESULTS* Echocardiogram 2D Echocardiogram has been performed.  Stacey Drain 10/18/2022, 9:17 AM

## 2022-10-18 NOTE — Progress Notes (Signed)
PROGRESS NOTE    Carlos Gill  EPP:[id number] DOB: 26-Dec-1940 DOA: 10/17/2022 PCP: Geoffry Paradise, MD   Brief Narrative:  82 year old with history of HTN, HLD, DM2, OSA on CPAP, CKD stage V presents to the ED with generalized weakness ongoing for few months but worsened over the last couple of days.  In the ER noted to have hemoglobin 7.5, creatinine 4.8.  Chest x-ray and CT head were negative.   Assessment & Plan:  Principal Problem:   Generalized weakness Active Problems:   Essential hypertension   Benign prostatic hyperplasia with urinary obstruction   GERD without esophagitis   Uncontrolled type 2 diabetes mellitus with hyperglycemia, with long-term current use of insulin (HCC)   Obesity (BMI 30-39.9)   Anemia of chronic disease   Elevated brain natriuretic peptide (BNP) level   Chronic kidney disease, stage V (HCC)   Mixed hyperlipidemia   Hypoalbuminemia due to protein-calorie malnutrition (HCC)   OSA on CPAP   Elevated troponin   Peripheral neuropathy     Generalized weakness possibly due to multifactorial Likely from versus renal disease   Anemia of chronic disease Baseline hemoglobin 8.5, admission hemoglobin 7.5.  Closely monitor.  Iron supplements.   Elevated BNP -Some signs of volume overload but no overt hypoxia.  Has elevated BNP   CKD stage V Has left upper extremity AV fistula but has not been placed on dialysis.  Over the last couple of months his baseline creatinine has been 4.5-5.5.  Close to his baseline currently.  Potassium and bicarb stable.  BUN 53.  Will consult neprho if needed otherwise follow up back with Dr Wolfgang Phoenix outptn   Hypoalbuminemia possibly secondary to mild protein calorie malnutrition -Increase nutrition   Type 2 diabetes mellitus with hyperglycemia Long-acting and sliding scale.  Adjust as necessary   Obesity (BMI 31.46) Diet and lifestyle modification   Essential hypertension (uncontrolled) Continue hydralazine, Lopressor,  nifedipine   Mixed hyperlipidemia Continue Zetia and Lopid   GERD Continue Protonix   BPH Continue Flomax   Peripheral neuropathy Continue gabapentin   OSA on CPAP Continue CPAP  DVT prophylaxis: Heparin Code Status: Full Family Communication:   Status is: Inpatient Hopefully dc tomorrow if renal fx stable    Subjective: Some exertional dysnea but improved.    Examination:  General exam: Appears calm and comfortable  Respiratory system: Clear to auscultation. Respiratory effort normal. Cardiovascular system: S1 & S2 heard, RRR. No JVD, murmurs, rubs, gallops or clicks. No pedal edema. Gastrointestinal system: Abdomen is nondistended, soft and nontender. No organomegaly or masses felt. Normal bowel sounds heard. Central nervous system: Alert and oriented. No focal neurological deficits. Extremities: Symmetric 5 x 5 power. Skin: No rashes, lesions or ulcers Psychiatry: Judgement and insight appear normal. Mood & affect appropriate. Has AVF     Diet Orders (From admission, onward)     Start     Ordered   10/17/22 1949  Diet heart healthy/carb modified Room service appropriate? Yes; Fluid consistency: Thin  Diet effective now       Question Answer Comment  Diet-HS Snack? Nothing   Room service appropriate? Yes   Fluid consistency: Thin      10/17/22 1949            Objective: Vitals:   10/17/22 1841 10/17/22 2152 10/18/22 0404 10/18/22 0500  BP: (!) 193/78 (!) 175/71 (!) 157/68   Pulse: 72 70 66   Resp: 16 20 17    Temp: 97.8 F (36.6 C) 98.4 F (  36.9 C) 98.7 F (37.1 C)   TempSrc: Oral  Oral   SpO2: 99% 100% 100%   Weight:    99.1 kg  Height:        Intake/Output Summary (Last 24 hours) at 10/18/2022 0942 Last data filed at 10/18/2022 2706 Gross per 24 hour  Intake --  Output 650 ml  Net -650 ml   Filed Weights   10/17/22 1347 10/18/22 0500  Weight: 105.2 kg 99.1 kg    Scheduled Meds:  aspirin EC  81 mg Oral q morning   calcitRIOL   0.5 mcg Oral BID   ezetimibe  10 mg Oral Daily   feeding supplement (GLUCERNA SHAKE)  237 mL Oral TID BM   ferrous sulfate  325 mg Oral Q M,W,F   gabapentin  100 mg Oral q morning   And   gabapentin  200 mg Oral QHS   gemfibrozil  600 mg Oral BID   heparin  5,000 Units Subcutaneous Q8H   hydrALAZINE  100 mg Oral TID   influenza vaccine adjuvanted  0.5 mL Intramuscular Tomorrow-1000   insulin aspart  0-5 Units Subcutaneous QHS   insulin aspart  0-6 Units Subcutaneous TID WC   insulin glargine-yfgn  10 Units Subcutaneous QHS   metoprolol tartrate  100 mg Oral q morning   And   metoprolol tartrate  75 mg Oral QHS   NIFEdipine  60 mg Oral Daily   pantoprazole  40 mg Oral Daily   pneumococcal 20-valent conjugate vaccine  0.5 mL Intramuscular Tomorrow-1000   tamsulosin  0.4 mg Oral QHS   Continuous Infusions:  Nutritional status     Body mass index is 29.63 kg/m.  Data Reviewed:   CBC: Recent Labs  Lab 10/17/22 1513 10/18/22 0520  WBC 5.5 4.9  NEUTROABS 4.0  --   HGB 7.5* 7.0*  HCT 24.0* 23.3*  MCV 88.6 89.3  PLT 251 225   Basic Metabolic Panel: Recent Labs  Lab 10/17/22 1513 10/18/22 0520  NA 139 142  K 4.2 3.7  CL 105 106  CO2 19* 22  GLUCOSE 192* 62*  BUN 56* 53*  CREATININE 4.81* 4.67*  CALCIUM 9.6 9.5  MG  --  2.0  PHOS  --  4.2   GFR: Estimated Creatinine Clearance: 14.9 mL/min (A) (by C-G formula based on SCr of 4.67 mg/dL (H)). Liver Function Tests: Recent Labs  Lab 10/17/22 1513 10/18/22 0520  AST 19 16  ALT 13 11  ALKPHOS 52 49  BILITOT 0.9 0.9  PROT 7.2 6.6  ALBUMIN 3.3* 3.1*   No results for input(s): "LIPASE", "AMYLASE" in the last 168 hours. No results for input(s): "AMMONIA" in the last 168 hours. Coagulation Profile: No results for input(s): "INR", "PROTIME" in the last 168 hours. Cardiac Enzymes: No results for input(s): "CKTOTAL", "CKMB", "CKMBINDEX", "TROPONINI" in the last 168 hours. BNP (last 3 results) No results for  input(s): "PROBNP" in the last 8760 hours. HbA1C: No results for input(s): "HGBA1C" in the last 72 hours. CBG: Recent Labs  Lab 10/17/22 1447 10/17/22 2150 10/18/22 0632 10/18/22 0731  GLUCAP 180* 112* 56* 137*   Lipid Profile: No results for input(s): "CHOL", "HDL", "LDLCALC", "TRIG", "CHOLHDL", "LDLDIRECT" in the last 72 hours. Thyroid Function Tests: No results for input(s): "TSH", "T4TOTAL", "FREET4", "T3FREE", "THYROIDAB" in the last 72 hours. Anemia Panel: No results for input(s): "VITAMINB12", "FOLATE", "FERRITIN", "TIBC", "IRON", "RETICCTPCT" in the last 72 hours. Sepsis Labs: Recent Labs  Lab 10/17/22 1513 10/17/22 1713  LATICACIDVEN 1.9 1.4    Recent Results (from the past 240 hour(s))  Resp panel by RT-PCR (RSV, Flu A&B, Covid) Anterior Nasal Swab     Status: None   Collection Time: 10/17/22  2:44 PM   Specimen: Anterior Nasal Swab  Result Value Ref Range Status   SARS Coronavirus 2 by RT PCR NEGATIVE NEGATIVE Final    Comment: (NOTE) SARS-CoV-2 target nucleic acids are NOT DETECTED.  The SARS-CoV-2 RNA is generally detectable in upper respiratory specimens during the acute phase of infection. The lowest concentration of SARS-CoV-2 viral copies this assay can detect is 138 copies/mL. A negative result does not preclude SARS-Cov-2 infection and should not be used as the sole basis for treatment or other patient management decisions. A negative result may occur with  improper specimen collection/handling, submission of specimen other than nasopharyngeal swab, presence of viral mutation(s) within the areas targeted by this assay, and inadequate number of viral copies(<138 copies/mL). A negative result must be combined with clinical observations, patient history, and epidemiological information. The expected result is Negative.  Fact Sheet for Patients:  BloggerCourse.com  Fact Sheet for Healthcare Providers:   SeriousBroker.it  This test is no t yet approved or cleared by the [place] FDA and  has been authorized for detection and/or diagnosis of SARS-CoV-2 by FDA under an Emergency Use Authorization (EUA). This EUA will remain  in effect (meaning this test can be used) for the duration of the COVID-19 declaration under Section 564(b)(1) of the Act, 21 U.S.C.section 360bbb-3(b)(1), unless the authorization is terminated  or revoked sooner.       Influenza A by PCR NEGATIVE NEGATIVE Final   Influenza B by PCR NEGATIVE NEGATIVE Final    Comment: (NOTE) The Xpert Xpress SARS-CoV-2/FLU/RSV plus assay is intended as an aid in the diagnosis of influenza from Nasopharyngeal swab specimens and should not be used as a sole basis for treatment. Nasal washings and aspirates are unacceptable for Xpert Xpress SARS-CoV-2/FLU/RSV testing.  Fact Sheet for Patients: BloggerCourse.com  Fact Sheet for Healthcare Providers: SeriousBroker.it  This test is not yet approved or cleared by the [place] FDA and has been authorized for detection and/or diagnosis of SARS-CoV-2 by FDA under an Emergency Use Authorization (EUA). This EUA will remain in effect (meaning this test can be used) for the duration of the COVID-19 declaration under Section 564(b)(1) of the Act, 21 U.S.C. section 360bbb-3(b)(1), unless the authorization is terminated or revoked.     Resp Syncytial Virus by PCR NEGATIVE NEGATIVE Final    Comment: (NOTE) Fact Sheet for Patients: BloggerCourse.com  Fact Sheet for Healthcare Providers: SeriousBroker.it  This test is not yet approved or cleared by the [place] FDA and has been authorized for detection and/or diagnosis of SARS-CoV-2 by FDA under an Emergency Use Authorization (EUA). This EUA will remain in effect (meaning this test can be used) for  the duration of the COVID-19 declaration under Section 564(b)(1) of the Act, 21 U.S.C. section 360bbb-3(b)(1), unless the authorization is terminated or revoked.  Performed at [facility], [address] [place], [place] [address]          Radiology Studies: CT Head Wo Contrast  Result Date: 10/17/2022 CLINICAL DATA:  Mental status change. Generalized weakness. Dizziness. EXAM: CT HEAD WITHOUT CONTRAST TECHNIQUE: Contiguous axial images were obtained from the base of the skull through the vertex without intravenous contrast. RADIATION DOSE REDUCTION: This exam was performed according to the departmental dose-optimization program which includes automated exposure control,  adjustment of the mA and/or kV according to patient size and/or use of iterative reconstruction technique. COMPARISON:  None Available. FINDINGS: Brain: No evidence of acute infarction, hemorrhage, hydrocephalus, or mass lesion/mass effect. 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 There is mild diffuse low-attenuation within the subcortical and periventricular white matter compatible with chronic microvascular disease. Vascular: No hyperdense vessel or unexpected calcification. Skull: Normal. Negative for fracture or focal lesion. Sinuses/Orbits: Asymmetric opacification of the left mastoid air cells. Paranasal sinuses are clear. Other: None. IMPRESSION: 1. No acute intracranial abnormalities. 2. Chronic microvascular disease. 3. 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 4. Left mastoid air cell effusion Electronically Signed   By: Signa Kell M.D.   On: 10/17/2022 15:12   DG Chest 1 View  Result Date: 10/17/2022 CLINICAL DATA:  Weakness. EXAM: CHEST  1 VIEW COMPARISON:  05/03/2022 and older exams. FINDINGS: Cardiac silhouette borderline enlarged. No mediastinal or hilar masses. Clear lungs.  No pleural effusion or pneumothorax.  Skeletal structures are grossly intact. IMPRESSION: No acute cardiopulmonary disease. Electronically Signed   By: Amie Portland M.D.   On: 10/17/2022 15:04           LOS: 1 day   Time spent= 35 mins    Miguel Rota, MD [facility]  If 7PM-7AM, please contact night-coverage  10/18/2022, 9:42 AM

## 2022-10-18 NOTE — Progress Notes (Signed)
Patient has refused CPAP for tonight.  Patient does not use at home.  No unit in room.

## 2022-10-18 NOTE — Progress Notes (Signed)
Mobility Specialist Progress Note:    10/18/22 1215  Mobility  Activity Ambulated with assistance in hallway  Level of Assistance Contact guard assist, steadying assist  Assistive Device None  Distance Ambulated (ft) 200 ft  Range of Motion/Exercises Active;All extremities  Activity Response Tolerated well  Mobility Referral Yes  $Mobility charge 1 Mobility  Mobility Specialist Start Time (ACUTE ONLY) 1215  Mobility Specialist Stop Time (ACUTE ONLY) 1230  Mobility Specialist Time Calculation (min) (ACUTE ONLY) 15 min   Pt received in bed, agreeable to mobility. Required CGA to stand and ambulate with no AD. Tolerated well, unstable near EOS. Returned pt sitting EOB, all needs met.   Lawerance Bach Mobility Specialist Please contact via Special educational needs teacher or  Rehab office at [phone]

## 2022-10-19 DIAGNOSIS — R531 Weakness: Secondary | ICD-10-CM | POA: Diagnosis not present

## 2022-10-19 LAB — CBC
HCT: 23.3 % — ABNORMAL LOW (ref 39.0–52.0)
Hemoglobin: 7.2 g/dL — ABNORMAL LOW (ref 13.0–17.0)
MCH: 27.6 pg (ref 26.0–34.0)
MCHC: 30.9 g/dL (ref 30.0–36.0)
MCV: 89.3 fL (ref 80.0–100.0)
Platelets: 180 10*3/uL (ref 150–400)
RBC: 2.61 MIL/uL — ABNORMAL LOW (ref 4.22–5.81)
RDW: 15.1 % (ref 11.5–15.5)
WBC: 4 10*3/uL (ref 4.0–10.5)
nRBC: 0 % (ref 0.0–0.2)

## 2022-10-19 LAB — BASIC METABOLIC PANEL
Anion gap: 15 (ref 5–15)
BUN: 52 mg/dL — ABNORMAL HIGH (ref 8–23)
CO2: 18 mmol/L — ABNORMAL LOW (ref 22–32)
Calcium: 9.5 mg/dL (ref 8.9–10.3)
Chloride: 106 mmol/L (ref 98–111)
Creatinine, Ser: 4.57 mg/dL — ABNORMAL HIGH (ref 0.61–1.24)
GFR, Estimated: 12 mL/min — ABNORMAL LOW (ref >60)
Glucose, Bld: 100 mg/dL — ABNORMAL HIGH (ref 70–99)
Potassium: 4 mmol/L (ref 3.5–5.1)
Sodium: 139 mmol/L (ref 135–145)

## 2022-10-19 LAB — GLUCOSE, CAPILLARY
Glucose-Capillary: 95 mg/dL (ref 70–99)
Glucose-Capillary: 135 mg/dL — ABNORMAL HIGH (ref 70–99)

## 2022-10-19 LAB — HEMOGLOBIN A1C
Hgb A1c MFr Bld: 7.9 % — ABNORMAL HIGH (ref 4.8–5.6)
Mean Plasma Glucose: 180 mg/dL

## 2022-10-19 LAB — PHOSPHORUS: Phosphorus: 4.4 mg/dL (ref 2.5–4.6)

## 2022-10-19 MED ORDER — GEMFIBROZIL 600 MG PO TABS: 600.0000 mg | ORAL_TABLET | Freq: Two times a day (BID) | ORAL | 0 refills | Status: DC

## 2022-10-19 NOTE — Progress Notes (Signed)
OT Cancellation Note  Patient Details Name: Carlos Gill MRN: [id number] DOB: 1940/06/19   Cancelled Treatment:    Reason Eval/Treat Not Completed: OT screened, no needs identified, will sign off. Discussed with PT. Pt is independent in mobility and ADLs, mobilizing 200 feet with mobility specialist. No further OT needs at this time.    Ezra Sites, OTR/L  [phone] 10/19/2022, 11:14 AM

## 2022-10-19 NOTE — Plan of Care (Signed)
°  Problem: Nutrition: °Goal: Adequate nutrition will be maintained °Outcome: Progressing °  °Problem: Coping: °Goal: Level of anxiety will decrease °Outcome: Progressing °  °Problem: Safety: °Goal: Ability to remain free from injury will improve °Outcome: Progressing °  °

## 2022-10-19 NOTE — Evaluation (Signed)
Physical Therapy Evaluation Patient Details Name: Carlos Gill MRN: [id number] DOB: 07-Jun-1940 Today's Date: 10/19/2022  History of Present Illness  Carlos Gill is an 82 y.o. male with medical history significant of hypertension, hyperlipidemia, type 2 diabetes mellitus, OSA on CPAP, CKD stage V who presents to the emergency department from home due to generalized weakness which has been ongoing for a few months, but which worsened within the last 48 hours.  Apparently, patient has anemia of chronic disease and has been receiving Epoeitin.  Daughter (over the phone) states that patient has been feeling weak and has been having occasional dizziness. He was noted to easily get tired by just walking a little within the house in the last 2 weeks and has spent most of the last week in bed due to weakness.  Patient complained of increased leg swelling during this time.  He denies chest pain, fever, chills, nausea, vomiting, abdominal pain or diarrhea.   Clinical Impression  Patient functioning near baseline for functional mobility and gait demonstrating good return for bed mobility, transfers and ambulating in room/hallway without loss of balance.  Plan:  Patient discharged from physical therapy to care of nursing for ambulation daily as tolerated for length of stay.          If plan is discharge home, recommend the following: Help with stairs or ramp for entrance   Can travel by private vehicle        Equipment Recommendations None recommended by PT  Recommendations for Other Services       Functional Status Assessment Patient has had a recent decline in their functional status and/or demonstrates limited ability to make significant improvements in function in a reasonable and predictable amount of time     Precautions / Restrictions Precautions Precautions: None Restrictions Weight Bearing Restrictions: No      Mobility  Bed Mobility Overal bed mobility: Modified Independent                   Transfers Overall transfer level: Modified independent                      Ambulation/Gait Ambulation/Gait assistance: Modified independent (Device/Increase time) Gait Distance (Feet): 150 Feet Assistive device: None Gait Pattern/deviations: WFL(Within Functional Limits) Gait velocity: decreased     General Gait Details: grossly WFL with good return for ambulating inn room, hallways without loss of balance  Stairs            Wheelchair Mobility     Tilt Bed    Modified Rankin (Stroke Patients Only)       Balance Overall balance assessment: No apparent balance deficits (not formally assessed)                                           Pertinent Vitals/Pain Pain Assessment Pain Assessment: No/denies pain    Home Living Family/patient expects to be discharged to:: Private residence Living Arrangements: Spouse/significant other;Other relatives Available Help at Discharge: Family;Available 24 hours/day Type of Home: House Home Access: Stairs to enter   [employer] of Steps: 5   Home Layout: One level Home Equipment: Shower seat      Prior Function Prior Level of Function : Independent/Modified Independent             Mobility Comments: household and short distanced community ambulator without AD ADLs  Comments: Assisted by family     Extremity/Trunk Assessment   Upper Extremity Assessment Upper Extremity Assessment: Overall WFL for tasks assessed    Lower Extremity Assessment Lower Extremity Assessment: Overall WFL for tasks assessed    Cervical / Trunk Assessment Cervical / Trunk Assessment: Normal  Communication   Communication Communication: No apparent difficulties  Cognition Arousal: Alert Behavior During Therapy: WFL for tasks assessed/performed Overall Cognitive Status: Within Functional Limits for tasks assessed                                           General Comments      Exercises     Assessment/Plan    PT Assessment Patient does not need any further PT services  PT Problem List         PT Treatment Interventions      PT Goals (Current goals can be found in the Care Plan section)  Acute Rehab PT Goals Patient Stated Goal: return home with family to assist PT Goal Formulation: With patient Time For Goal Achievement: 10/19/22 Potential to Achieve Goals: Good    Frequency       Co-evaluation               AM-PAC PT "6 Clicks" Mobility  Outcome Measure Help needed turning from your back to your side while in a flat bed without using bedrails?: None Help needed moving from lying on your back to sitting on the side of a flat bed without using bedrails?: None Help needed moving to and from a bed to a chair (including a wheelchair)?: None Help needed standing up from a chair using your arms (e.g., wheelchair or bedside chair)?: None Help needed to walk in hospital room?: None Help needed climbing 3-5 steps with a railing? : A Little 6 Click Score: 23    End of Session   Activity Tolerance: Patient tolerated treatment well;Patient limited by fatigue Patient left: in bed;with call bell/phone within reach Nurse Communication: Mobility status PT Visit Diagnosis: Unsteadiness on feet (R26.81);Other abnormalities of gait and mobility (R26.89);Muscle weakness (generalized) (M62.81)    Time: 9562-1308 PT Time Calculation (min) (ACUTE ONLY): 25 min   Charges:   PT Evaluation $PT Eval Moderate Complexity: 1 Mod PT Treatments $Therapeutic Activity: 23-37 mins PT General Charges $$ ACUTE PT VISIT: 1 Visit         12:16 PM, 10/19/22 Ocie Bob, MPT Physical Therapist with [facility] 336 [phone] office [phone] mobile phone

## 2022-10-19 NOTE — Discharge Summary (Signed)
Pain medications.   Procedures/Studies: ECHOCARDIOGRAM COMPLETE  Result Date: 10/18/2022    ECHOCARDIOGRAM REPORT   Patient Name:   Carlos Gill Date of Exam: 10/18/2022 Medical Rec #:  [id number]    Height:       72.0 in Accession #:    [id number]   Weight:       218.5 lb Date of Birth:  04-Sep-1940    BSA:          2.212 m Patient Age:    82 years     BP:           157/68 mmHg Patient Gender: M            HR:           66 bpm. [facility] Procedure: 2D Echo, Cardiac Doppler and Color Doppler Indications:    CHF-Acute Diastolic I50.31  History:        Patient has no prior history of Echocardiogram examinations.                 Risk Factors:Hypertension, Diabetes and Dyslipidemia. OSA on                 CPAP.  Sonographer:    Celesta Gentile RCS Referring Phys: [id number] OLADAPO ADEFESO IMPRESSIONS  1. Left ventricular ejection fraction, by estimation, is 60 to 65%. The left ventricle has normal function. The left ventricle has no regional wall motion abnormalities. There is severe asymmetric left ventricular hypertrophy of the lateral segment. Left ventricular diastolic parameters are consistent with Grade I diastolic dysfunction (impaired relaxation).  2. Right ventricular systolic function is normal. The right ventricular size is normal. There is moderately elevated pulmonary artery systolic pressure. The estimated right ventricular systolic pressure is 55.6 mmHg.  3. Left atrial size was severely dilated.  4. A small pericardial effusion is  present. The pericardial effusion is circumferential.  5. The mitral valve is normal in structure. Mild mitral valve regurgitation. No evidence of mitral stenosis.  6. The aortic valve is tricuspid. Aortic valve regurgitation is mild. Mild aortic valve stenosis. Aortic valve mean gradient measures 8.0 mmHg. Aortic valve Vmax measures 2.05 m/s.  7. The inferior vena cava is dilated in size with >50% respiratory variability, suggesting right atrial pressure of 8 mmHg. Comparison(s): No prior Echocardiogram. FINDINGS  Left Ventricle: Left ventricular ejection fraction, by estimation, is 60 to 65%. The left ventricle has normal function. The left ventricle has no regional wall motion abnormalities. The left ventricular internal cavity size was normal in size. There is  severe asymmetric left ventricular hypertrophy of the lateral segment. Left ventricular diastolic parameters are consistent with Grade I diastolic dysfunction (impaired relaxation). Right Ventricle: The right ventricular size is normal. No increase in right ventricular wall thickness. Right ventricular systolic function is normal. There is moderately elevated pulmonary artery systolic pressure. The tricuspid regurgitant velocity is 3.45 m/s, and with an assumed right atrial pressure of 8 mmHg, the estimated right ventricular systolic pressure is 55.6 mmHg. Left Atrium: Left atrial size was severely dilated. Right Atrium: Right atrial size was normal in size. Pericardium: A small pericardial effusion is present. The pericardial effusion is circumferential. Mitral Valve: The mitral valve is normal in structure. Mild mitral valve regurgitation. No evidence of mitral valve stenosis. Tricuspid Valve: The tricuspid valve is normal in structure. Tricuspid valve regurgitation is trivial. No evidence of tricuspid stenosis. Aortic Valve: The aortic valve is tricuspid. Aortic valve regurgitation is mild. Mild  Physician Discharge Summary  Carlos Gill [id number]:[id number] DOB: Jul 27, 1940 DOA: 10/17/2022  PCP: Geoffry Paradise, MD  Admit date: 10/17/2022 Discharge date: 10/19/2022  Admitted From: Home Disposition: Home  Recommendations for Outpatient Follow-up:  Follow up with PCP in 1-2 weeks Please obtain BMP/CBC in one week your next doctors visit.  Has patient follow-up appointment with his outpatient nephrologist this afternoon around 3 PM.  Discharge Condition: Stable CODE STATUS: Full code Diet recommendation: Renal/diabetic  Brief/Interim Summary: 82 year old with history of HTN, HLD, DM2, OSA on CPAP, CKD stage V presents to the ED with generalized weakness ongoing for few months but worsened over the last couple of days.  In the ER noted to have hemoglobin 7.5, creatinine 4.8.  Chest x-ray and CT head were negative.  During the hospitalization patient was closely monitored, received lactulose for elevated ammonia level.  Mentation slowly improved on its own, renal function remained stable.  Was seen by PT/OT who recommended no further therapy needs. Patient is up with his outpatient nephrology team today, he can follow-up with them to further determine needs of HD     Assessment & Plan:  Principal Problem:   Generalized weakness Active Problems:   Essential hypertension   Benign prostatic hyperplasia with urinary obstruction   GERD without esophagitis   Uncontrolled type 2 diabetes mellitus with hyperglycemia, with long-term current use of insulin (HCC)   Obesity (BMI 30-39.9)   Anemia of chronic disease   Elevated brain natriuretic peptide (BNP) level   Chronic kidney disease, stage V (HCC)   Mixed hyperlipidemia   Hypoalbuminemia due to protein-calorie malnutrition (HCC)   OSA on CPAP   Elevated troponin   Peripheral neuropathy      Generalized weakness possibly due to multifactorial Likely from versus renal disease   Anemia of chronic disease Hemoglobin is currently stable  around 7.5.  Follow-up outpatient nephrology   Elevated BNP -Some signs of volume overload but no overt hypoxia.  Has elevated BNP   CKD stage V Has left upper extremity AV fistula but has not been placed on dialysis.  Over the last couple of months his baseline creatinine has been 4.5-5.5.  Will need to follow-up with his outpatient nephrology   Hypoalbuminemia possibly secondary to mild protein calorie malnutrition -Increase nutrition   Type 2 diabetes mellitus with hyperglycemia Resume home regimen   Obesity (BMI 31.46) Diet and lifestyle modification   Essential hypertension (uncontrolled) Continue hydralazine, Lopressor, nifedipine   Mixed hyperlipidemia Continue Zetia and Lopid   GERD Continue Protonix   BPH Continue Flomax   Peripheral neuropathy Continue gabapentin   OSA on CPAP Continue CPAP  Discharge Diagnoses:  Principal Problem:   Generalized weakness Active Problems:   Essential hypertension   Benign prostatic hyperplasia with urinary obstruction   GERD without esophagitis   Uncontrolled type 2 diabetes mellitus with hyperglycemia, with long-term current use of insulin (HCC)   Obesity (BMI 30-39.9)   Anemia of chronic disease   Elevated brain natriuretic peptide (BNP) level   Chronic kidney disease, stage V (HCC)   Mixed hyperlipidemia   Hypoalbuminemia due to protein-calorie malnutrition (HCC)   OSA on CPAP   Elevated troponin   Peripheral neuropathy      Consultations: None  Subjective: Feels great, wants to go home so he can follow-up with his outpatient nephrology  Discharge Exam: Vitals:   10/18/22 2225 10/19/22 0354  BP: (!) 186/84 (!) 169/83  Pulse:  70  Resp:  20  Temp:  98.9 F (  Physician Discharge Summary  Carlos Gill [id number] DOB: Jul 27, 1940 DOA: 10/17/2022  PCP: Geoffry Paradise, MD  Admit date: 10/17/2022 Discharge date: 10/19/2022  Admitted From: Home Disposition: Home  Recommendations for Outpatient Follow-up:  Follow up with PCP in 1-2 weeks Please obtain BMP/CBC in one week your next doctors visit.  Has patient follow-up appointment with his outpatient nephrologist this afternoon around 3 PM.  Discharge Condition: Stable CODE STATUS: Full code Diet recommendation: Renal/diabetic  Brief/Interim Summary: 82 year old with history of HTN, HLD, DM2, OSA on CPAP, CKD stage V presents to the ED with generalized weakness ongoing for few months but worsened over the last couple of days.  In the ER noted to have hemoglobin 7.5, creatinine 4.8.  Chest x-ray and CT head were negative.  During the hospitalization patient was closely monitored, received lactulose for elevated ammonia level.  Mentation slowly improved on its own, renal function remained stable.  Was seen by PT/OT who recommended no further therapy needs. Patient is up with his outpatient nephrology team today, he can follow-up with them to further determine needs of HD     Assessment & Plan:  Principal Problem:   Generalized weakness Active Problems:   Essential hypertension   Benign prostatic hyperplasia with urinary obstruction   GERD without esophagitis   Uncontrolled type 2 diabetes mellitus with hyperglycemia, with long-term current use of insulin (HCC)   Obesity (BMI 30-39.9)   Anemia of chronic disease   Elevated brain natriuretic peptide (BNP) level   Chronic kidney disease, stage V (HCC)   Mixed hyperlipidemia   Hypoalbuminemia due to protein-calorie malnutrition (HCC)   OSA on CPAP   Elevated troponin   Peripheral neuropathy      Generalized weakness possibly due to multifactorial Likely from versus renal disease   Anemia of chronic disease Hemoglobin is currently stable  around 7.5.  Follow-up outpatient nephrology   Elevated BNP -Some signs of volume overload but no overt hypoxia.  Has elevated BNP   CKD stage V Has left upper extremity AV fistula but has not been placed on dialysis.  Over the last couple of months his baseline creatinine has been 4.5-5.5.  Will need to follow-up with his outpatient nephrology   Hypoalbuminemia possibly secondary to mild protein calorie malnutrition -Increase nutrition   Type 2 diabetes mellitus with hyperglycemia Resume home regimen   Obesity (BMI 31.46) Diet and lifestyle modification   Essential hypertension (uncontrolled) Continue hydralazine, Lopressor, nifedipine   Mixed hyperlipidemia Continue Zetia and Lopid   GERD Continue Protonix   BPH Continue Flomax   Peripheral neuropathy Continue gabapentin   OSA on CPAP Continue CPAP  Discharge Diagnoses:  Principal Problem:   Generalized weakness Active Problems:   Essential hypertension   Benign prostatic hyperplasia with urinary obstruction   GERD without esophagitis   Uncontrolled type 2 diabetes mellitus with hyperglycemia, with long-term current use of insulin (HCC)   Obesity (BMI 30-39.9)   Anemia of chronic disease   Elevated brain natriuretic peptide (BNP) level   Chronic kidney disease, stage V (HCC)   Mixed hyperlipidemia   Hypoalbuminemia due to protein-calorie malnutrition (HCC)   OSA on CPAP   Elevated troponin   Peripheral neuropathy      Consultations: None  Subjective: Feels great, wants to go home so he can follow-up with his outpatient nephrology  Discharge Exam: Vitals:   10/18/22 2225 10/19/22 0354  BP: (!) 186/84 (!) 169/83  Pulse:  70  Resp:  20  Temp:  98.9 F (  Pain medications.   Procedures/Studies: ECHOCARDIOGRAM COMPLETE  Result Date: 10/18/2022    ECHOCARDIOGRAM REPORT   Patient Name:   Carlos Gill Date of Exam: 10/18/2022 Medical Rec #:  [id number]    Height:       72.0 in Accession #:    [id number]   Weight:       218.5 lb Date of Birth:  04-Sep-1940    BSA:          2.212 m Patient Age:    82 years     BP:           157/68 mmHg Patient Gender: M            HR:           66 bpm. [facility] Procedure: 2D Echo, Cardiac Doppler and Color Doppler Indications:    CHF-Acute Diastolic I50.31  History:        Patient has no prior history of Echocardiogram examinations.                 Risk Factors:Hypertension, Diabetes and Dyslipidemia. OSA on                 CPAP.  Sonographer:    Celesta Gentile RCS Referring Phys: [id number] OLADAPO ADEFESO IMPRESSIONS  1. Left ventricular ejection fraction, by estimation, is 60 to 65%. The left ventricle has normal function. The left ventricle has no regional wall motion abnormalities. There is severe asymmetric left ventricular hypertrophy of the lateral segment. Left ventricular diastolic parameters are consistent with Grade I diastolic dysfunction (impaired relaxation).  2. Right ventricular systolic function is normal. The right ventricular size is normal. There is moderately elevated pulmonary artery systolic pressure. The estimated right ventricular systolic pressure is 55.6 mmHg.  3. Left atrial size was severely dilated.  4. A small pericardial effusion is  present. The pericardial effusion is circumferential.  5. The mitral valve is normal in structure. Mild mitral valve regurgitation. No evidence of mitral stenosis.  6. The aortic valve is tricuspid. Aortic valve regurgitation is mild. Mild aortic valve stenosis. Aortic valve mean gradient measures 8.0 mmHg. Aortic valve Vmax measures 2.05 m/s.  7. The inferior vena cava is dilated in size with >50% respiratory variability, suggesting right atrial pressure of 8 mmHg. Comparison(s): No prior Echocardiogram. FINDINGS  Left Ventricle: Left ventricular ejection fraction, by estimation, is 60 to 65%. The left ventricle has normal function. The left ventricle has no regional wall motion abnormalities. The left ventricular internal cavity size was normal in size. There is  severe asymmetric left ventricular hypertrophy of the lateral segment. Left ventricular diastolic parameters are consistent with Grade I diastolic dysfunction (impaired relaxation). Right Ventricle: The right ventricular size is normal. No increase in right ventricular wall thickness. Right ventricular systolic function is normal. There is moderately elevated pulmonary artery systolic pressure. The tricuspid regurgitant velocity is 3.45 m/s, and with an assumed right atrial pressure of 8 mmHg, the estimated right ventricular systolic pressure is 55.6 mmHg. Left Atrium: Left atrial size was severely dilated. Right Atrium: Right atrial size was normal in size. Pericardium: A small pericardial effusion is present. The pericardial effusion is circumferential. Mitral Valve: The mitral valve is normal in structure. Mild mitral valve regurgitation. No evidence of mitral valve stenosis. Tricuspid Valve: The tricuspid valve is normal in structure. Tricuspid valve regurgitation is trivial. No evidence of tricuspid stenosis. Aortic Valve: The aortic valve is tricuspid. Aortic valve regurgitation is mild. Mild  Physician Discharge Summary  Carlos Gill [id number] DOB: Jul 27, 1940 DOA: 10/17/2022  PCP: Geoffry Paradise, MD  Admit date: 10/17/2022 Discharge date: 10/19/2022  Admitted From: Home Disposition: Home  Recommendations for Outpatient Follow-up:  Follow up with PCP in 1-2 weeks Please obtain BMP/CBC in one week your next doctors visit.  Has patient follow-up appointment with his outpatient nephrologist this afternoon around 3 PM.  Discharge Condition: Stable CODE STATUS: Full code Diet recommendation: Renal/diabetic  Brief/Interim Summary: 82 year old with history of HTN, HLD, DM2, OSA on CPAP, CKD stage V presents to the ED with generalized weakness ongoing for few months but worsened over the last couple of days.  In the ER noted to have hemoglobin 7.5, creatinine 4.8.  Chest x-ray and CT head were negative.  During the hospitalization patient was closely monitored, received lactulose for elevated ammonia level.  Mentation slowly improved on its own, renal function remained stable.  Was seen by PT/OT who recommended no further therapy needs. Patient is up with his outpatient nephrology team today, he can follow-up with them to further determine needs of HD     Assessment & Plan:  Principal Problem:   Generalized weakness Active Problems:   Essential hypertension   Benign prostatic hyperplasia with urinary obstruction   GERD without esophagitis   Uncontrolled type 2 diabetes mellitus with hyperglycemia, with long-term current use of insulin (HCC)   Obesity (BMI 30-39.9)   Anemia of chronic disease   Elevated brain natriuretic peptide (BNP) level   Chronic kidney disease, stage V (HCC)   Mixed hyperlipidemia   Hypoalbuminemia due to protein-calorie malnutrition (HCC)   OSA on CPAP   Elevated troponin   Peripheral neuropathy      Generalized weakness possibly due to multifactorial Likely from versus renal disease   Anemia of chronic disease Hemoglobin is currently stable  around 7.5.  Follow-up outpatient nephrology   Elevated BNP -Some signs of volume overload but no overt hypoxia.  Has elevated BNP   CKD stage V Has left upper extremity AV fistula but has not been placed on dialysis.  Over the last couple of months his baseline creatinine has been 4.5-5.5.  Will need to follow-up with his outpatient nephrology   Hypoalbuminemia possibly secondary to mild protein calorie malnutrition -Increase nutrition   Type 2 diabetes mellitus with hyperglycemia Resume home regimen   Obesity (BMI 31.46) Diet and lifestyle modification   Essential hypertension (uncontrolled) Continue hydralazine, Lopressor, nifedipine   Mixed hyperlipidemia Continue Zetia and Lopid   GERD Continue Protonix   BPH Continue Flomax   Peripheral neuropathy Continue gabapentin   OSA on CPAP Continue CPAP  Discharge Diagnoses:  Principal Problem:   Generalized weakness Active Problems:   Essential hypertension   Benign prostatic hyperplasia with urinary obstruction   GERD without esophagitis   Uncontrolled type 2 diabetes mellitus with hyperglycemia, with long-term current use of insulin (HCC)   Obesity (BMI 30-39.9)   Anemia of chronic disease   Elevated brain natriuretic peptide (BNP) level   Chronic kidney disease, stage V (HCC)   Mixed hyperlipidemia   Hypoalbuminemia due to protein-calorie malnutrition (HCC)   OSA on CPAP   Elevated troponin   Peripheral neuropathy      Consultations: None  Subjective: Feels great, wants to go home so he can follow-up with his outpatient nephrology  Discharge Exam: Vitals:   10/18/22 2225 10/19/22 0354  BP: (!) 186/84 (!) 169/83  Pulse:  70  Resp:  20  Temp:  98.9 F (  Pain medications.   Procedures/Studies: ECHOCARDIOGRAM COMPLETE  Result Date: 10/18/2022    ECHOCARDIOGRAM REPORT   Patient Name:   Carlos Gill Date of Exam: 10/18/2022 Medical Rec #:  [id number]    Height:       72.0 in Accession #:    [id number]   Weight:       218.5 lb Date of Birth:  04-Sep-1940    BSA:          2.212 m Patient Age:    82 years     BP:           157/68 mmHg Patient Gender: M            HR:           66 bpm. [facility] Procedure: 2D Echo, Cardiac Doppler and Color Doppler Indications:    CHF-Acute Diastolic I50.31  History:        Patient has no prior history of Echocardiogram examinations.                 Risk Factors:Hypertension, Diabetes and Dyslipidemia. OSA on                 CPAP.  Sonographer:    Celesta Gentile RCS Referring Phys: [id number] OLADAPO ADEFESO IMPRESSIONS  1. Left ventricular ejection fraction, by estimation, is 60 to 65%. The left ventricle has normal function. The left ventricle has no regional wall motion abnormalities. There is severe asymmetric left ventricular hypertrophy of the lateral segment. Left ventricular diastolic parameters are consistent with Grade I diastolic dysfunction (impaired relaxation).  2. Right ventricular systolic function is normal. The right ventricular size is normal. There is moderately elevated pulmonary artery systolic pressure. The estimated right ventricular systolic pressure is 55.6 mmHg.  3. Left atrial size was severely dilated.  4. A small pericardial effusion is  present. The pericardial effusion is circumferential.  5. The mitral valve is normal in structure. Mild mitral valve regurgitation. No evidence of mitral stenosis.  6. The aortic valve is tricuspid. Aortic valve regurgitation is mild. Mild aortic valve stenosis. Aortic valve mean gradient measures 8.0 mmHg. Aortic valve Vmax measures 2.05 m/s.  7. The inferior vena cava is dilated in size with >50% respiratory variability, suggesting right atrial pressure of 8 mmHg. Comparison(s): No prior Echocardiogram. FINDINGS  Left Ventricle: Left ventricular ejection fraction, by estimation, is 60 to 65%. The left ventricle has normal function. The left ventricle has no regional wall motion abnormalities. The left ventricular internal cavity size was normal in size. There is  severe asymmetric left ventricular hypertrophy of the lateral segment. Left ventricular diastolic parameters are consistent with Grade I diastolic dysfunction (impaired relaxation). Right Ventricle: The right ventricular size is normal. No increase in right ventricular wall thickness. Right ventricular systolic function is normal. There is moderately elevated pulmonary artery systolic pressure. The tricuspid regurgitant velocity is 3.45 m/s, and with an assumed right atrial pressure of 8 mmHg, the estimated right ventricular systolic pressure is 55.6 mmHg. Left Atrium: Left atrial size was severely dilated. Right Atrium: Right atrial size was normal in size. Pericardium: A small pericardial effusion is present. The pericardial effusion is circumferential. Mitral Valve: The mitral valve is normal in structure. Mild mitral valve regurgitation. No evidence of mitral valve stenosis. Tricuspid Valve: The tricuspid valve is normal in structure. Tricuspid valve regurgitation is trivial. No evidence of tricuspid stenosis. Aortic Valve: The aortic valve is tricuspid. Aortic valve regurgitation is mild. Mild  Pain medications.   Procedures/Studies: ECHOCARDIOGRAM COMPLETE  Result Date: 10/18/2022    ECHOCARDIOGRAM REPORT   Patient Name:   Carlos Gill Date of Exam: 10/18/2022 Medical Rec #:  [id number]    Height:       72.0 in Accession #:    [id number]   Weight:       218.5 lb Date of Birth:  04-Sep-1940    BSA:          2.212 m Patient Age:    82 years     BP:           157/68 mmHg Patient Gender: M            HR:           66 bpm. [facility] Procedure: 2D Echo, Cardiac Doppler and Color Doppler Indications:    CHF-Acute Diastolic I50.31  History:        Patient has no prior history of Echocardiogram examinations.                 Risk Factors:Hypertension, Diabetes and Dyslipidemia. OSA on                 CPAP.  Sonographer:    Celesta Gentile RCS Referring Phys: [id number] OLADAPO ADEFESO IMPRESSIONS  1. Left ventricular ejection fraction, by estimation, is 60 to 65%. The left ventricle has normal function. The left ventricle has no regional wall motion abnormalities. There is severe asymmetric left ventricular hypertrophy of the lateral segment. Left ventricular diastolic parameters are consistent with Grade I diastolic dysfunction (impaired relaxation).  2. Right ventricular systolic function is normal. The right ventricular size is normal. There is moderately elevated pulmonary artery systolic pressure. The estimated right ventricular systolic pressure is 55.6 mmHg.  3. Left atrial size was severely dilated.  4. A small pericardial effusion is  present. The pericardial effusion is circumferential.  5. The mitral valve is normal in structure. Mild mitral valve regurgitation. No evidence of mitral stenosis.  6. The aortic valve is tricuspid. Aortic valve regurgitation is mild. Mild aortic valve stenosis. Aortic valve mean gradient measures 8.0 mmHg. Aortic valve Vmax measures 2.05 m/s.  7. The inferior vena cava is dilated in size with >50% respiratory variability, suggesting right atrial pressure of 8 mmHg. Comparison(s): No prior Echocardiogram. FINDINGS  Left Ventricle: Left ventricular ejection fraction, by estimation, is 60 to 65%. The left ventricle has normal function. The left ventricle has no regional wall motion abnormalities. The left ventricular internal cavity size was normal in size. There is  severe asymmetric left ventricular hypertrophy of the lateral segment. Left ventricular diastolic parameters are consistent with Grade I diastolic dysfunction (impaired relaxation). Right Ventricle: The right ventricular size is normal. No increase in right ventricular wall thickness. Right ventricular systolic function is normal. There is moderately elevated pulmonary artery systolic pressure. The tricuspid regurgitant velocity is 3.45 m/s, and with an assumed right atrial pressure of 8 mmHg, the estimated right ventricular systolic pressure is 55.6 mmHg. Left Atrium: Left atrial size was severely dilated. Right Atrium: Right atrial size was normal in size. Pericardium: A small pericardial effusion is present. The pericardial effusion is circumferential. Mitral Valve: The mitral valve is normal in structure. Mild mitral valve regurgitation. No evidence of mitral valve stenosis. Tricuspid Valve: The tricuspid valve is normal in structure. Tricuspid valve regurgitation is trivial. No evidence of tricuspid stenosis. Aortic Valve: The aortic valve is tricuspid. Aortic valve regurgitation is mild. Mild  Physician Discharge Summary  Carlos Gill NWG:[id number] DOB: Jul 27, 1940 DOA: 10/17/2022  PCP: Geoffry Paradise, MD  Admit date: 10/17/2022 Discharge date: 10/19/2022  Admitted From: Home Disposition: Home  Recommendations for Outpatient Follow-up:  Follow up with PCP in 1-2 weeks Please obtain BMP/CBC in one week your next doctors visit.  Has patient follow-up appointment with his outpatient nephrologist this afternoon around 3 PM.  Discharge Condition: Stable CODE STATUS: Full code Diet recommendation: Renal/diabetic  Brief/Interim Summary: 82 year old with history of HTN, HLD, DM2, OSA on CPAP, CKD stage V presents to the ED with generalized weakness ongoing for few months but worsened over the last couple of days.  In the ER noted to have hemoglobin 7.5, creatinine 4.8.  Chest x-ray and CT head were negative.  During the hospitalization patient was closely monitored, received lactulose for elevated ammonia level.  Mentation slowly improved on its own, renal function remained stable.  Was seen by PT/OT who recommended no further therapy needs. Patient is up with his outpatient nephrology team today, he can follow-up with them to further determine needs of HD     Assessment & Plan:  Principal Problem:   Generalized weakness Active Problems:   Essential hypertension   Benign prostatic hyperplasia with urinary obstruction   GERD without esophagitis   Uncontrolled type 2 diabetes mellitus with hyperglycemia, with long-term current use of insulin (HCC)   Obesity (BMI 30-39.9)   Anemia of chronic disease   Elevated brain natriuretic peptide (BNP) level   Chronic kidney disease, stage V (HCC)   Mixed hyperlipidemia   Hypoalbuminemia due to protein-calorie malnutrition (HCC)   OSA on CPAP   Elevated troponin   Peripheral neuropathy      Generalized weakness possibly due to multifactorial Likely from versus renal disease   Anemia of chronic disease Hemoglobin is currently stable  around 7.5.  Follow-up outpatient nephrology   Elevated BNP -Some signs of volume overload but no overt hypoxia.  Has elevated BNP   CKD stage V Has left upper extremity AV fistula but has not been placed on dialysis.  Over the last couple of months his baseline creatinine has been 4.5-5.5.  Will need to follow-up with his outpatient nephrology   Hypoalbuminemia possibly secondary to mild protein calorie malnutrition -Increase nutrition   Type 2 diabetes mellitus with hyperglycemia Resume home regimen   Obesity (BMI 31.46) Diet and lifestyle modification   Essential hypertension (uncontrolled) Continue hydralazine, Lopressor, nifedipine   Mixed hyperlipidemia Continue Zetia and Lopid   GERD Continue Protonix   BPH Continue Flomax   Peripheral neuropathy Continue gabapentin   OSA on CPAP Continue CPAP  Discharge Diagnoses:  Principal Problem:   Generalized weakness Active Problems:   Essential hypertension   Benign prostatic hyperplasia with urinary obstruction   GERD without esophagitis   Uncontrolled type 2 diabetes mellitus with hyperglycemia, with long-term current use of insulin (HCC)   Obesity (BMI 30-39.9)   Anemia of chronic disease   Elevated brain natriuretic peptide (BNP) level   Chronic kidney disease, stage V (HCC)   Mixed hyperlipidemia   Hypoalbuminemia due to protein-calorie malnutrition (HCC)   OSA on CPAP   Elevated troponin   Peripheral neuropathy      Consultations: None  Subjective: Feels great, wants to go home so he can follow-up with his outpatient nephrology  Discharge Exam: Vitals:   10/18/22 2225 10/19/22 0354  BP: (!) 186/84 (!) 169/83  Pulse:  70  Resp:  20  Temp:  98.9 F (  Pain medications.   Procedures/Studies: ECHOCARDIOGRAM COMPLETE  Result Date: 10/18/2022    ECHOCARDIOGRAM REPORT   Patient Name:   Carlos Gill Date of Exam: 10/18/2022 Medical Rec #:  [id number]    Height:       72.0 in Accession #:    [id number]   Weight:       218.5 lb Date of Birth:  04-Sep-1940    BSA:          2.212 m Patient Age:    82 years     BP:           157/68 mmHg Patient Gender: M            HR:           66 bpm. [facility] Procedure: 2D Echo, Cardiac Doppler and Color Doppler Indications:    CHF-Acute Diastolic I50.31  History:        Patient has no prior history of Echocardiogram examinations.                 Risk Factors:Hypertension, Diabetes and Dyslipidemia. OSA on                 CPAP.  Sonographer:    Celesta Gentile RCS Referring Phys: [id number] OLADAPO ADEFESO IMPRESSIONS  1. Left ventricular ejection fraction, by estimation, is 60 to 65%. The left ventricle has normal function. The left ventricle has no regional wall motion abnormalities. There is severe asymmetric left ventricular hypertrophy of the lateral segment. Left ventricular diastolic parameters are consistent with Grade I diastolic dysfunction (impaired relaxation).  2. Right ventricular systolic function is normal. The right ventricular size is normal. There is moderately elevated pulmonary artery systolic pressure. The estimated right ventricular systolic pressure is 55.6 mmHg.  3. Left atrial size was severely dilated.  4. A small pericardial effusion is  present. The pericardial effusion is circumferential.  5. The mitral valve is normal in structure. Mild mitral valve regurgitation. No evidence of mitral stenosis.  6. The aortic valve is tricuspid. Aortic valve regurgitation is mild. Mild aortic valve stenosis. Aortic valve mean gradient measures 8.0 mmHg. Aortic valve Vmax measures 2.05 m/s.  7. The inferior vena cava is dilated in size with >50% respiratory variability, suggesting right atrial pressure of 8 mmHg. Comparison(s): No prior Echocardiogram. FINDINGS  Left Ventricle: Left ventricular ejection fraction, by estimation, is 60 to 65%. The left ventricle has normal function. The left ventricle has no regional wall motion abnormalities. The left ventricular internal cavity size was normal in size. There is  severe asymmetric left ventricular hypertrophy of the lateral segment. Left ventricular diastolic parameters are consistent with Grade I diastolic dysfunction (impaired relaxation). Right Ventricle: The right ventricular size is normal. No increase in right ventricular wall thickness. Right ventricular systolic function is normal. There is moderately elevated pulmonary artery systolic pressure. The tricuspid regurgitant velocity is 3.45 m/s, and with an assumed right atrial pressure of 8 mmHg, the estimated right ventricular systolic pressure is 55.6 mmHg. Left Atrium: Left atrial size was severely dilated. Right Atrium: Right atrial size was normal in size. Pericardium: A small pericardial effusion is present. The pericardial effusion is circumferential. Mitral Valve: The mitral valve is normal in structure. Mild mitral valve regurgitation. No evidence of mitral valve stenosis. Tricuspid Valve: The tricuspid valve is normal in structure. Tricuspid valve regurgitation is trivial. No evidence of tricuspid stenosis. Aortic Valve: The aortic valve is tricuspid. Aortic valve regurgitation is mild. Mild  Pain medications.   Procedures/Studies: ECHOCARDIOGRAM COMPLETE  Result Date: 10/18/2022    ECHOCARDIOGRAM REPORT   Patient Name:   Carlos Gill Date of Exam: 10/18/2022 Medical Rec #:  [id number]    Height:       72.0 in Accession #:    [id number]   Weight:       218.5 lb Date of Birth:  04-Sep-1940    BSA:          2.212 m Patient Age:    82 years     BP:           157/68 mmHg Patient Gender: M            HR:           66 bpm. [facility] Procedure: 2D Echo, Cardiac Doppler and Color Doppler Indications:    CHF-Acute Diastolic I50.31  History:        Patient has no prior history of Echocardiogram examinations.                 Risk Factors:Hypertension, Diabetes and Dyslipidemia. OSA on                 CPAP.  Sonographer:    Celesta Gentile RCS Referring Phys: [id number] OLADAPO ADEFESO IMPRESSIONS  1. Left ventricular ejection fraction, by estimation, is 60 to 65%. The left ventricle has normal function. The left ventricle has no regional wall motion abnormalities. There is severe asymmetric left ventricular hypertrophy of the lateral segment. Left ventricular diastolic parameters are consistent with Grade I diastolic dysfunction (impaired relaxation).  2. Right ventricular systolic function is normal. The right ventricular size is normal. There is moderately elevated pulmonary artery systolic pressure. The estimated right ventricular systolic pressure is 55.6 mmHg.  3. Left atrial size was severely dilated.  4. A small pericardial effusion is  present. The pericardial effusion is circumferential.  5. The mitral valve is normal in structure. Mild mitral valve regurgitation. No evidence of mitral stenosis.  6. The aortic valve is tricuspid. Aortic valve regurgitation is mild. Mild aortic valve stenosis. Aortic valve mean gradient measures 8.0 mmHg. Aortic valve Vmax measures 2.05 m/s.  7. The inferior vena cava is dilated in size with >50% respiratory variability, suggesting right atrial pressure of 8 mmHg. Comparison(s): No prior Echocardiogram. FINDINGS  Left Ventricle: Left ventricular ejection fraction, by estimation, is 60 to 65%. The left ventricle has normal function. The left ventricle has no regional wall motion abnormalities. The left ventricular internal cavity size was normal in size. There is  severe asymmetric left ventricular hypertrophy of the lateral segment. Left ventricular diastolic parameters are consistent with Grade I diastolic dysfunction (impaired relaxation). Right Ventricle: The right ventricular size is normal. No increase in right ventricular wall thickness. Right ventricular systolic function is normal. There is moderately elevated pulmonary artery systolic pressure. The tricuspid regurgitant velocity is 3.45 m/s, and with an assumed right atrial pressure of 8 mmHg, the estimated right ventricular systolic pressure is 55.6 mmHg. Left Atrium: Left atrial size was severely dilated. Right Atrium: Right atrial size was normal in size. Pericardium: A small pericardial effusion is present. The pericardial effusion is circumferential. Mitral Valve: The mitral valve is normal in structure. Mild mitral valve regurgitation. No evidence of mitral valve stenosis. Tricuspid Valve: The tricuspid valve is normal in structure. Tricuspid valve regurgitation is trivial. No evidence of tricuspid stenosis. Aortic Valve: The aortic valve is tricuspid. Aortic valve regurgitation is mild. Mild

## 2022-10-22 ENCOUNTER — Other Ambulatory Visit (HOSPITAL_COMMUNITY)
Admission: RE | Admit: 2022-10-22 | Discharge: 2022-10-22 | Disposition: A | Discharge status: Home / Self Care | Payer: No Typology Code available for payment source | Source: Ambulatory Visit | Attending: Nephrology | Admitting: Nephrology

## 2022-10-22 DIAGNOSIS — N2581 Secondary hyperparathyroidism of renal origin: Secondary | ICD-10-CM | POA: Insufficient documentation

## 2022-10-22 DIAGNOSIS — E1129 Type 2 diabetes mellitus with other diabetic kidney complication: Secondary | ICD-10-CM | POA: Insufficient documentation

## 2022-10-22 DIAGNOSIS — D638 Anemia in other chronic diseases classified elsewhere: Secondary | ICD-10-CM | POA: Diagnosis not present

## 2022-10-22 DIAGNOSIS — N185 Chronic kidney disease, stage 5: Secondary | ICD-10-CM | POA: Diagnosis present

## 2022-10-22 DIAGNOSIS — E1122 Type 2 diabetes mellitus with diabetic chronic kidney disease: Secondary | ICD-10-CM | POA: Insufficient documentation

## 2022-10-22 LAB — RENAL FUNCTION PANEL
Albumin: 3.6 g/dL (ref 3.5–5.0)
Anion gap: 13 (ref 5–15)
BUN: 59 mg/dL — ABNORMAL HIGH (ref 8–23)
CO2: 20 mmol/L — ABNORMAL LOW (ref 22–32)
Calcium: 9.4 mg/dL (ref 8.9–10.3)
Chloride: 104 mmol/L (ref 98–111)
Creatinine, Ser: 5.34 mg/dL — ABNORMAL HIGH (ref 0.61–1.24)
GFR, Estimated: 10 mL/min — ABNORMAL LOW (ref >60)
Glucose, Bld: 139 mg/dL — ABNORMAL HIGH (ref 70–99)
Phosphorus: 5.5 mg/dL — ABNORMAL HIGH (ref 2.5–4.6)
Potassium: 4.4 mmol/L (ref 3.5–5.1)
Sodium: 137 mmol/L (ref 135–145)

## 2022-10-22 LAB — CBC
HCT: 25.3 % — ABNORMAL LOW (ref 39.0–52.0)
Hemoglobin: 7.8 g/dL — ABNORMAL LOW (ref 13.0–17.0)
MCH: 27.5 pg (ref 26.0–34.0)
MCHC: 30.8 g/dL (ref 30.0–36.0)
MCV: 89.1 fL (ref 80.0–100.0)
Platelets: 244 10*3/uL (ref 150–400)
RBC: 2.84 MIL/uL — ABNORMAL LOW (ref 4.22–5.81)
RDW: 15 % (ref 11.5–15.5)
WBC: 5.1 10*3/uL (ref 4.0–10.5)
nRBC: 0 % (ref 0.0–0.2)

## 2022-10-22 LAB — IRON AND TIBC
Iron: 88 ug/dL (ref 45–182)
Saturation Ratios: 23 % (ref 17.9–39.5)
TIBC: 389 ug/dL (ref 250–450)
UIBC: 301 ug/dL

## 2022-10-22 LAB — HEPATITIS B CORE ANTIBODY, TOTAL: Hep B Core Total Ab: NONREACTIVE

## 2022-10-22 LAB — HEPATITIS B SURFACE ANTIBODY,QUALITATIVE: Hep B S Ab: NONREACTIVE

## 2022-10-22 LAB — FERRITIN: Ferritin: 125 ng/mL (ref 24–336)

## 2022-10-22 LAB — PROTEIN / CREATININE RATIO, URINE
Creatinine, Urine: 231 mg/dL
Protein Creatinine Ratio: 1.51 mg/mg{Cre} — ABNORMAL HIGH (ref 0.00–0.15)
Total Protein, Urine: 349 mg/dL

## 2022-10-22 LAB — HEPATITIS C ANTIBODY: HCV Ab: NONREACTIVE

## 2022-10-22 LAB — HEPATITIS B SURFACE ANTIGEN: Hepatitis B Surface Ag: NONREACTIVE

## 2022-10-25 LAB — PARATHYROID HORMONE, INTACT (NO CA): PTH: 73 pg/mL — ABNORMAL HIGH (ref 15–65)

## 2022-10-26 ENCOUNTER — Ambulatory Visit: Payer: No Typology Code available for payment source

## 2022-10-26 ENCOUNTER — Ambulatory Visit (HOSPITAL_COMMUNITY): Payer: No Typology Code available for payment source | Attending: Internal Medicine

## 2022-10-27 LAB — QUANTIFERON-TB GOLD PLUS (RQFGPL)
QuantiFERON Mitogen Value: 2.52 IU/mL
QuantiFERON Nil Value: 0.01 IU/mL
QuantiFERON TB1 Ag Value: 0 IU/mL
QuantiFERON TB2 Ag Value: 0 IU/mL

## 2022-10-27 LAB — QUANTIFERON-TB GOLD PLUS: QuantiFERON-TB Gold Plus: NEGATIVE

## 2022-11-19 ENCOUNTER — Ambulatory Visit (INDEPENDENT_AMBULATORY_CARE_PROVIDER_SITE_OTHER): Payer: No Typology Code available for payment source | Admitting: Physician Assistant

## 2022-11-19 ENCOUNTER — Ambulatory Visit (HOSPITAL_COMMUNITY)
Admission: RE | Admit: 2022-11-19 | Discharge: 2022-11-19 | Disposition: A | Discharge status: Home / Self Care | Payer: No Typology Code available for payment source | Source: Ambulatory Visit | Attending: Vascular Surgery

## 2022-11-19 VITALS — BP 144/83 | HR 66 | Temp 98.3°F | Ht 72.0 in | Wt 220.9 lb

## 2022-11-19 DIAGNOSIS — Z992 Dependence on renal dialysis: Secondary | ICD-10-CM | POA: Diagnosis not present

## 2022-11-19 DIAGNOSIS — N186 End stage renal disease: Secondary | ICD-10-CM | POA: Diagnosis not present

## 2022-11-19 DIAGNOSIS — N185 Chronic kidney disease, stage 5: Secondary | ICD-10-CM | POA: Diagnosis present

## 2022-11-19 NOTE — Progress Notes (Signed)
Office Note   History of Present Illness   Carlos Gill is a 82 y.o. (08/08/1940) male who presents for follow up for dialysis access.  He recently underwent left brachiocephalic AV fistula creation on 08/16/2022 by Dr. Lenell Antu.  This was done for permanent dialysis access.  At the time the patient had a history of CKD 5 not requiring dialysis yet.  At his last follow-up with our office his fistula was not fully matured.  He still had no set dialysis start date.  He returns today for repeat duplex of his fistula.  He denies any issues with his left arm or hand.  He recently got a right IJ TDC placed and started dialysis on Tuesdays, Thursdays, and Saturdays.  He has laparoscopic peritoneal dialysis catheter insertion surgery scheduled with general surgery at [facility] on 11/24/2022.  He states he wanted to pursue peritoneal dialysis instead of using his fistula because he is "scared of needles".  Current Outpatient Medications  Medication Sig Dispense Refill   acetaminophen (TYLENOL) 500 MG tablet Take 1,000 mg by mouth daily as needed for moderate pain.     aspirin EC 81 MG tablet Take 81 mg by mouth every morning.     calcitRIOL (ROCALTROL) 0.5 MCG capsule Take 0.5 mcg by mouth 2 (two) times daily.     Carboxymethylcellulose Sod PF (THERATEARS PF) 0.25 % SOLN Place 1 drop into both eyes in the morning.     Continuous Glucose Sensor (FREESTYLE LIBRE 2 SENSOR) MISC as directed topically change every 14 days to monitor blood glucose continuously     epoetin alfa (EPOGEN) 4000 UNIT/ML injection Inject 4,000 Units into the skin once a week.     ezetimibe (ZETIA) 10 MG tablet Take 10 mg by mouth daily.     ferrous sulfate 325 (65 FE) MG tablet Take 325 mg by mouth every Monday, Wednesday, and Friday.     fluticasone (FLONASE) 50 MCG/ACT nasal spray Place 1 spray into both nostrils daily as needed for rhinitis.     furosemide (LASIX) 20 MG tablet Take 40 mg by mouth every morning.     gabapentin  (NEURONTIN) 100 MG capsule Take 100-200 mg by mouth See admin instructions. Take 100 mg in the morning and 200 mg at bedtime     gemfibrozil (LOPID) 600 MG tablet Take 1 tablet (600 mg total) by mouth 2 (two) times daily. 60 tablet 0   hydrALAZINE (APRESOLINE) 100 MG tablet Take 100 mg by mouth 3 (three) times daily.     insulin aspart (NOVOLOG) 100 UNIT/ML injection Inject 6-18 Units into the skin 3 (three) times daily with meals. Per sliding scale     insulin glargine (LANTUS) 100 units/mL SOLN Inject 45 Units into the skin 2 (two) times daily.     metoprolol tartrate (LOPRESSOR) 50 MG tablet Take 75-100 mg by mouth See admin instructions. Take 100 mg in the morning and 75 mg at bedtime     Multiple Vitamins-Minerals (PRESERVISION AREDS 2) CAPS Take 1 capsule by mouth 2 (two) times daily.     NIFEdipine (ADALAT CC) 60 MG 24 hr tablet Take 60 mg by mouth daily.     omeprazole (PRILOSEC) 20 MG capsule Take 20 mg by mouth daily.     sodium bicarbonate 650 MG tablet Take 650 mg by mouth 3 (three) times daily.     tamsulosin (FLOMAX) 0.4 MG CAPS capsule Take 0.4 mg by mouth at bedtime.     triamcinolone ointment (KENALOG)  0.1 % Apply 1 Application topically daily as needed (rash).     No current facility-administered medications for this visit.    REVIEW OF SYSTEMS (negative unless checked):   Cardiac:  []  Chest pain or chest pressure? []  Shortness of breath upon activity? []  Shortness of breath when lying flat? []  Irregular heart rhythm?  Vascular:  []  Pain in calf, thigh, or hip brought on by walking? []  Pain in feet at night that wakes you up from your sleep? []  Blood clot in your veins? []  Leg swelling?  Pulmonary:  []  Oxygen at home? []  Productive cough? []  Wheezing?  Neurologic:  []  Sudden weakness in arms or legs? []  Sudden numbness in arms or legs? []  Sudden onset of difficult speaking or slurred speech? []  Temporary loss of vision in one eye? []  Problems with  dizziness?  Gastrointestinal:  []  Blood in stool? []  Vomited blood?  Genitourinary:  []  Burning when urinating? []  Blood in urine?  Psychiatric:  []  Major depression  Hematologic:  []  Bleeding problems? []  Problems with blood clotting?  Dermatologic:  []  Rashes or ulcers?  Constitutional:  []  Fever or chills?  Ear/Nose/Throat:  []  Change in hearing? []  Nose bleeds? []  Sore throat?  Musculoskeletal:  []  Back pain? []  Joint pain? []  Muscle pain?   Physical Examination   Vitals:   11/19/22 1239  BP: (!) 144/83  Pulse: 66  Temp: 98.3 F (36.8 C)  TempSrc: Temporal  SpO2: 97%  Weight: 220 lb 14.4 oz (100.2 kg)  Height: 6' (1.829 m)   Body mass index is 29.96 kg/m.  General:  WDWN in NAD; vital signs documented above Gait: Not observed HENT: WNL, normocephalic Pulmonary: normal non-labored breathing , without rales, rhonchi,  wheezing Cardiac: regular Abdomen: soft, NT, no masses Skin: without rashes Vascular Exam/Pulses: palpable left radial pulse Extremities: left upper arm fistula with good thrill on palpation. Somewhat deep in the mid-upper arm Musculoskeletal: no muscle wasting or atrophy  Neurologic: A&O X 3;  No focal weakness or paresthesias are detected Psychiatric:  The pt has Normal affect.   Non-invasive Vascular Imaging   Left Arm Access Duplex  (11/19/2022):  +--------------------+----------+-----------------+--------+  AVF                PSV (cm/s)Flow Vol (mL/min)Comments  +--------------------+----------+-----------------+--------+  Native artery inflow   129           660                 +--------------------+----------+-----------------+--------+  AVF Anastomosis        437                               +--------------------+----------+-----------------+--------+     +------------+----------+-------------+----------+--------------+  OUTFLOW VEINPSV (cm/s)Diameter (cm)Depth (cm)   Describe      +------------+----------+-------------+----------+--------------+  Shoulder      154        0.57        1.29                   +------------+----------+-------------+----------+--------------+  Prox UA        147        0.64        0.96                   +------------+----------+-------------+----------+--------------+  Mid UA         144        0.79  0.60   Retained valve  +------------+----------+-------------+----------+--------------+  Dist UA        113        0.78        0.76                   +------------+----------+-------------+----------+--------------+  AC Fossa       107        0.84        0.29   Retained valve  +------------+----------+-------------+----------+--------------+    Medical Decision Making   Carlos Gill is a 82 y.o. male who presents with repeat fistula follow up  The patient has a history of left brachiocephalic AV fistula creation on 08/16/2022 by Dr. Lenell Antu.  At his first follow-up with our office his fistula was slow to mature with flow volume less than 600 He returns today for repeat evaluation.  Duplex demonstrates a well matured fistula now with a flow volume of 616 mL/min.  Diameters in the arm are greater than 6 mm.  On exam his fistula has a great thrill.  It is easily identifiable in the [facility] fossa and distal bicep.  The fistula becomes fairly deep in the mid to upper arm. The patient recently started dialysis on Tuesdays, Thursdays, and Saturdays via right TDC.  He also has plans for laparoscopic PD catheter insertion at [facility] on October 23.  At this time he does not want treatment through this fistula because he is scared of needles. I explained to the patient in the future should his PD catheter fail or if he does not want his TDC, his left brachiocephalic fistula can be used.  Potentially the fistula is too deep for cannulation in the mid upper arm.  If his dialysis center has difficulty cannulating this area in the  future, we could possibly pursue superficialization He can follow-up with our office as needed or if he should ever require fistula superficialization   Loel Dubonnet PA-C [facility] Office: [phone]  Clinic MD: Hetty Blend

## 2022-12-27 ENCOUNTER — Ambulatory Visit (HOSPITAL_COMMUNITY)
Admission: RE | Admit: 2022-12-27 | Discharge: 2022-12-27 | Disposition: A | Discharge status: Home / Self Care | Payer: Medicare Other | Source: Ambulatory Visit | Attending: Nurse Practitioner | Admitting: Nurse Practitioner

## 2022-12-27 ENCOUNTER — Other Ambulatory Visit (HOSPITAL_COMMUNITY): Payer: Self-pay | Admitting: Nurse Practitioner

## 2022-12-27 DIAGNOSIS — K5900 Constipation, unspecified: Secondary | ICD-10-CM | POA: Insufficient documentation

## 2023-01-11 ENCOUNTER — Other Ambulatory Visit (HOSPITAL_COMMUNITY): Payer: Self-pay | Admitting: Nurse Practitioner

## 2023-01-11 ENCOUNTER — Ambulatory Visit (HOSPITAL_COMMUNITY)
Admission: RE | Admit: 2023-01-11 | Discharge: 2023-01-11 | Disposition: A | Discharge status: Home / Self Care | Payer: Medicare Other | Source: Ambulatory Visit | Attending: Nurse Practitioner | Admitting: Nurse Practitioner

## 2023-01-11 DIAGNOSIS — R140 Abdominal distension (gaseous): Secondary | ICD-10-CM | POA: Diagnosis present

## 2023-01-12 ENCOUNTER — Encounter (HOSPITAL_COMMUNITY): Admission: RE | Payer: Self-pay | Source: Home / Self Care

## 2023-01-12 ENCOUNTER — Ambulatory Visit (HOSPITAL_COMMUNITY): Admission: RE | Admit: 2023-01-12 | Payer: Medicare Other | Source: Home / Self Care | Admitting: Nephrology

## 2023-01-12 SURGERY — DIALYSIS/PERMA CATHETER REMOVAL

## 2023-05-26 ENCOUNTER — Other Ambulatory Visit: Payer: Self-pay

## 2023-05-26 ENCOUNTER — Emergency Department (HOSPITAL_COMMUNITY)

## 2023-05-26 ENCOUNTER — Inpatient Hospital Stay (HOSPITAL_COMMUNITY)
Admission: EM | Admit: 2023-05-26 | Discharge: 2023-05-31 | DRG: 871 | Disposition: A | Discharge status: Home Health | Attending: Family Medicine | Admitting: Family Medicine

## 2023-05-26 ENCOUNTER — Encounter (HOSPITAL_COMMUNITY): Payer: Self-pay

## 2023-05-26 DIAGNOSIS — E785 Hyperlipidemia, unspecified: Secondary | ICD-10-CM | POA: Diagnosis present

## 2023-05-26 DIAGNOSIS — E11649 Type 2 diabetes mellitus with hypoglycemia without coma: Secondary | ICD-10-CM | POA: Diagnosis present

## 2023-05-26 DIAGNOSIS — Z79899 Other long term (current) drug therapy: Secondary | ICD-10-CM | POA: Diagnosis not present

## 2023-05-26 DIAGNOSIS — G4733 Obstructive sleep apnea (adult) (pediatric): Secondary | ICD-10-CM | POA: Diagnosis not present

## 2023-05-26 DIAGNOSIS — Z1152 Encounter for screening for COVID-19: Secondary | ICD-10-CM

## 2023-05-26 DIAGNOSIS — Z888 Allergy status to other drugs, medicaments and biological substances status: Secondary | ICD-10-CM

## 2023-05-26 DIAGNOSIS — Z992 Dependence on renal dialysis: Secondary | ICD-10-CM

## 2023-05-26 DIAGNOSIS — E872 Acidosis, unspecified: Secondary | ICD-10-CM | POA: Diagnosis present

## 2023-05-26 DIAGNOSIS — N185 Chronic kidney disease, stage 5: Secondary | ICD-10-CM | POA: Diagnosis not present

## 2023-05-26 DIAGNOSIS — I5032 Chronic diastolic (congestive) heart failure: Secondary | ICD-10-CM | POA: Diagnosis present

## 2023-05-26 DIAGNOSIS — N138 Other obstructive and reflux uropathy: Secondary | ICD-10-CM | POA: Diagnosis present

## 2023-05-26 DIAGNOSIS — N401 Enlarged prostate with lower urinary tract symptoms: Secondary | ICD-10-CM | POA: Diagnosis present

## 2023-05-26 DIAGNOSIS — Z961 Presence of intraocular lens: Secondary | ICD-10-CM | POA: Diagnosis present

## 2023-05-26 DIAGNOSIS — I10 Essential (primary) hypertension: Secondary | ICD-10-CM | POA: Diagnosis not present

## 2023-05-26 DIAGNOSIS — E1122 Type 2 diabetes mellitus with diabetic chronic kidney disease: Secondary | ICD-10-CM | POA: Diagnosis present

## 2023-05-26 DIAGNOSIS — N3001 Acute cystitis with hematuria: Principal | ICD-10-CM | POA: Diagnosis present

## 2023-05-26 DIAGNOSIS — A419 Sepsis, unspecified organism: Secondary | ICD-10-CM | POA: Diagnosis not present

## 2023-05-26 DIAGNOSIS — N2581 Secondary hyperparathyroidism of renal origin: Secondary | ICD-10-CM | POA: Diagnosis present

## 2023-05-26 DIAGNOSIS — E876 Hypokalemia: Secondary | ICD-10-CM | POA: Diagnosis present

## 2023-05-26 DIAGNOSIS — D631 Anemia in chronic kidney disease: Secondary | ICD-10-CM | POA: Diagnosis present

## 2023-05-26 DIAGNOSIS — I132 Hypertensive heart and chronic kidney disease with heart failure and with stage 5 chronic kidney disease, or end stage renal disease: Secondary | ICD-10-CM | POA: Diagnosis present

## 2023-05-26 DIAGNOSIS — N186 End stage renal disease: Secondary | ICD-10-CM | POA: Diagnosis present

## 2023-05-26 DIAGNOSIS — Z91048 Other nonmedicinal substance allergy status: Secondary | ICD-10-CM

## 2023-05-26 DIAGNOSIS — Z7982 Long term (current) use of aspirin: Secondary | ICD-10-CM

## 2023-05-26 DIAGNOSIS — Z1612 Extended spectrum beta lactamase (ESBL) resistance: Secondary | ICD-10-CM | POA: Diagnosis present

## 2023-05-26 DIAGNOSIS — A4150 Gram-negative sepsis, unspecified: Secondary | ICD-10-CM | POA: Diagnosis present

## 2023-05-26 DIAGNOSIS — Z8042 Family history of malignant neoplasm of prostate: Secondary | ICD-10-CM

## 2023-05-26 DIAGNOSIS — R6520 Severe sepsis without septic shock: Secondary | ICD-10-CM | POA: Insufficient documentation

## 2023-05-26 DIAGNOSIS — Z87891 Personal history of nicotine dependence: Secondary | ICD-10-CM

## 2023-05-26 DIAGNOSIS — N3000 Acute cystitis without hematuria: Secondary | ICD-10-CM

## 2023-05-26 DIAGNOSIS — Z833 Family history of diabetes mellitus: Secondary | ICD-10-CM

## 2023-05-26 DIAGNOSIS — N390 Urinary tract infection, site not specified: Secondary | ICD-10-CM | POA: Diagnosis not present

## 2023-05-26 DIAGNOSIS — E1165 Type 2 diabetes mellitus with hyperglycemia: Secondary | ICD-10-CM

## 2023-05-26 DIAGNOSIS — Z794 Long term (current) use of insulin: Secondary | ICD-10-CM | POA: Diagnosis not present

## 2023-05-26 DIAGNOSIS — T383X5A Adverse effect of insulin and oral hypoglycemic [antidiabetic] drugs, initial encounter: Secondary | ICD-10-CM | POA: Diagnosis present

## 2023-05-26 DIAGNOSIS — B9620 Unspecified Escherichia coli [E. coli] as the cause of diseases classified elsewhere: Secondary | ICD-10-CM | POA: Diagnosis not present

## 2023-05-26 DIAGNOSIS — Z1624 Resistance to multiple antibiotics: Secondary | ICD-10-CM | POA: Diagnosis present

## 2023-05-26 DIAGNOSIS — K219 Gastro-esophageal reflux disease without esophagitis: Secondary | ICD-10-CM | POA: Diagnosis present

## 2023-05-26 DIAGNOSIS — Z91199 Patient's noncompliance with other medical treatment and regimen due to unspecified reason: Secondary | ICD-10-CM

## 2023-05-26 LAB — URINALYSIS, W/ REFLEX TO CULTURE (INFECTION SUSPECTED)
Bilirubin Urine: NEGATIVE
Glucose, UA: 50 mg/dL — AB
Ketones, ur: NEGATIVE mg/dL
Nitrite: POSITIVE — AB
Protein, ur: 100 mg/dL — AB
Specific Gravity, Urine: 1.011 (ref 1.005–1.030)
pH: 7 (ref 5.0–8.0)

## 2023-05-26 LAB — RESP PANEL BY RT-PCR (RSV, FLU A&B, COVID)  RVPGX2
Influenza A by PCR: NEGATIVE
Influenza B by PCR: NEGATIVE
Resp Syncytial Virus by PCR: NEGATIVE
SARS Coronavirus 2 by RT PCR: NEGATIVE

## 2023-05-26 LAB — CBC WITH DIFFERENTIAL/PLATELET
Abs Immature Granulocytes: 0.03 10*3/uL (ref 0.00–0.07)
Basophils Absolute: 0 10*3/uL (ref 0.0–0.1)
Basophils Relative: 0 %
Eosinophils Absolute: 0.1 10*3/uL (ref 0.0–0.5)
Eosinophils Relative: 1 %
HCT: 39.4 % (ref 39.0–52.0)
Hemoglobin: 12.5 g/dL — ABNORMAL LOW (ref 13.0–17.0)
Immature Granulocytes: 0 %
Lymphocytes Relative: 11 %
Lymphs Abs: 1.2 10*3/uL (ref 0.7–4.0)
MCH: 26.6 pg (ref 26.0–34.0)
MCHC: 31.7 g/dL (ref 30.0–36.0)
MCV: 83.8 fL (ref 80.0–100.0)
Monocytes Absolute: 0.9 10*3/uL (ref 0.1–1.0)
Monocytes Relative: 9 %
Neutro Abs: 8.6 10*3/uL — ABNORMAL HIGH (ref 1.7–7.7)
Neutrophils Relative %: 79 %
Platelets: 165 10*3/uL (ref 150–400)
RBC: 4.7 MIL/uL (ref 4.22–5.81)
RDW: 15.4 % (ref 11.5–15.5)
WBC: 10.8 10*3/uL — ABNORMAL HIGH (ref 4.0–10.5)
nRBC: 0 % (ref 0.0–0.2)

## 2023-05-26 LAB — COMPREHENSIVE METABOLIC PANEL WITH GFR
ALT: 26 U/L (ref 0–44)
AST: 22 U/L (ref 15–41)
Albumin: 3.6 g/dL (ref 3.5–5.0)
Alkaline Phosphatase: 76 U/L (ref 38–126)
Anion gap: 13 (ref 5–15)
BUN: 53 mg/dL — ABNORMAL HIGH (ref 8–23)
CO2: 21 mmol/L — ABNORMAL LOW (ref 22–32)
Calcium: 9 mg/dL (ref 8.9–10.3)
Chloride: 105 mmol/L (ref 98–111)
Creatinine, Ser: 3.9 mg/dL — ABNORMAL HIGH (ref 0.61–1.24)
GFR, Estimated: 15 mL/min — ABNORMAL LOW (ref >60)
Glucose, Bld: 146 mg/dL — ABNORMAL HIGH (ref 70–99)
Potassium: 3.4 mmol/L — ABNORMAL LOW (ref 3.5–5.1)
Sodium: 139 mmol/L (ref 135–145)
Total Bilirubin: 0.8 mg/dL (ref 0.0–1.2)
Total Protein: 7 g/dL (ref 6.5–8.1)

## 2023-05-26 LAB — PROTIME-INR
INR: 1 (ref 0.8–1.2)
Prothrombin Time: 13.4 s (ref 11.4–15.2)

## 2023-05-26 LAB — CBG MONITORING, ED: Glucose-Capillary: 115 mg/dL — ABNORMAL HIGH (ref 70–99)

## 2023-05-26 LAB — LACTIC ACID, PLASMA
Lactic Acid, Venous: 2 mmol/L (ref 0.5–1.9)
Lactic Acid, Venous: 1 mmol/L (ref 0.5–1.9)

## 2023-05-26 MED ORDER — CALCITRIOL 0.5 MCG PO CAPS
0.5000 ug | ORAL_CAPSULE | Freq: Two times a day (BID) | ORAL | Status: DC
Start: 2023-05-26 — End: 2023-05-31
  Administered 2023-05-26 – 2023-05-31 (×9): 0.5 ug via ORAL
  Filled 2023-05-26 (×4): qty 1
  Filled 2023-05-26: qty 2
  Filled 2023-05-26 (×4): qty 1

## 2023-05-26 MED ORDER — PANTOPRAZOLE SODIUM 40 MG PO TBEC
40.0000 mg | DELAYED_RELEASE_TABLET | Freq: Every day | ORAL | Status: DC
  Administered 2023-05-27 – 2023-05-31 (×5): 40 mg via ORAL
  Filled 2023-05-26 (×5): qty 1

## 2023-05-26 MED ORDER — METOPROLOL TARTRATE 25 MG PO TABS
75.0000 mg | ORAL_TABLET | ORAL | Status: DC
Start: 2023-05-26 — End: 2023-05-26

## 2023-05-26 MED ORDER — GABAPENTIN 100 MG PO CAPS
100.0000 mg | ORAL_CAPSULE | Freq: Every day | ORAL | Status: DC
  Administered 2023-05-27 – 2023-05-31 (×5): 100 mg via ORAL
  Filled 2023-05-26 (×5): qty 1

## 2023-05-26 MED ORDER — PROCHLORPERAZINE EDISYLATE 10 MG/2ML IJ SOLN: 5.0000 mg | Freq: Four times a day (QID) | INTRAMUSCULAR | Status: DC | PRN

## 2023-05-26 MED ORDER — ACETAMINOPHEN 325 MG PO TABS
650.0000 mg | ORAL_TABLET | Freq: Once | ORAL | Status: AC
  Administered 2023-05-26: 650 mg via ORAL
  Filled 2023-05-26: qty 2

## 2023-05-26 MED ORDER — METRONIDAZOLE 500 MG/100ML IV SOLN
500.0000 mg | Freq: Once | INTRAVENOUS | Status: AC
  Administered 2023-05-26: 500 mg via INTRAVENOUS
  Filled 2023-05-26: qty 100

## 2023-05-26 MED ORDER — GABAPENTIN 100 MG PO CAPS
100.0000 mg | ORAL_CAPSULE | ORAL | Status: DC
Start: 2023-05-26 — End: 2023-05-26

## 2023-05-26 MED ORDER — INSULIN ASPART 100 UNIT/ML IJ SOLN: 0.0000 [IU] | Freq: Every day | INTRAMUSCULAR | Status: DC

## 2023-05-26 MED ORDER — HYDRALAZINE HCL 50 MG PO TABS
100.0000 mg | ORAL_TABLET | Freq: Three times a day (TID) | ORAL | Status: DC
  Administered 2023-05-26 – 2023-05-31 (×14): 100 mg via ORAL
  Filled 2023-05-26 (×15): qty 2

## 2023-05-26 MED ORDER — ACETAMINOPHEN 325 MG PO TABS
650.0000 mg | ORAL_TABLET | Freq: Four times a day (QID) | ORAL | Status: DC | PRN
  Administered 2023-05-27 – 2023-05-30 (×2): 650 mg via ORAL
  Filled 2023-05-26 (×2): qty 2

## 2023-05-26 MED ORDER — SODIUM CHLORIDE 0.9 % IV SOLN
2.0000 g | INTRAVENOUS | Status: DC
  Administered 2023-05-27 – 2023-05-28 (×2): 2 g via INTRAVENOUS
  Filled 2023-05-26 (×2): qty 20

## 2023-05-26 MED ORDER — INSULIN ASPART 100 UNIT/ML IJ SOLN
0.0000 [IU] | Freq: Three times a day (TID) | INTRAMUSCULAR | Status: DC
  Administered 2023-05-29: 2 [IU] via SUBCUTANEOUS
  Administered 2023-05-30 (×2): 1 [IU] via SUBCUTANEOUS

## 2023-05-26 MED ORDER — ACETAMINOPHEN 650 MG RE SUPP: 650.0000 mg | Freq: Four times a day (QID) | RECTAL | Status: DC | PRN

## 2023-05-26 MED ORDER — METOPROLOL TARTRATE 100 MG PO TABS
100.0000 mg | ORAL_TABLET | Freq: Every day | ORAL | Status: DC
  Administered 2023-05-27 – 2023-05-31 (×5): 100 mg via ORAL
  Filled 2023-05-26 (×2): qty 1
  Filled 2023-05-26: qty 2
  Filled 2023-05-26 (×2): qty 1

## 2023-05-26 MED ORDER — INSULIN GLARGINE-YFGN 100 UNIT/ML ~~LOC~~ SOLN
40.0000 [IU] | Freq: Every day | SUBCUTANEOUS | Status: DC
  Administered 2023-05-26: 40 [IU] via SUBCUTANEOUS
  Filled 2023-05-26 (×2): qty 0.4

## 2023-05-26 MED ORDER — SODIUM CHLORIDE 0.9 % IV SOLN
2.0000 g | Freq: Once | INTRAVENOUS | Status: AC
  Administered 2023-05-26: 2 g via INTRAVENOUS
  Filled 2023-05-26: qty 20

## 2023-05-26 MED ORDER — METOPROLOL TARTRATE 50 MG PO TABS
75.0000 mg | ORAL_TABLET | Freq: Every day | ORAL | Status: DC
  Administered 2023-05-27 – 2023-05-30 (×4): 75 mg via ORAL
  Filled 2023-05-26 (×4): qty 1

## 2023-05-26 MED ORDER — INSULIN ASPART 100 UNIT/ML IJ SOLN: 4.0000 [IU] | Freq: Three times a day (TID) | INTRAMUSCULAR | Status: DC

## 2023-05-26 MED ORDER — HEPARIN SODIUM (PORCINE) 5000 UNIT/ML IJ SOLN
5000.0000 [IU] | Freq: Three times a day (TID) | INTRAMUSCULAR | Status: DC
  Administered 2023-05-26 – 2023-05-31 (×14): 5000 [IU] via SUBCUTANEOUS
  Filled 2023-05-26 (×14): qty 1

## 2023-05-26 MED ORDER — SODIUM CHLORIDE 0.9% FLUSH
3.0000 mL | Freq: Two times a day (BID) | INTRAVENOUS | Status: DC
Start: 2023-05-26 — End: 2023-05-31
  Administered 2023-05-26 – 2023-05-31 (×10): 3 mL via INTRAVENOUS

## 2023-05-26 MED ORDER — OXYCODONE HCL 5 MG PO TABS
2.5000 mg | ORAL_TABLET | ORAL | Status: DC | PRN
Start: 2023-05-26 — End: 2023-05-31

## 2023-05-26 MED ORDER — SODIUM CHLORIDE 0.9 % IV BOLUS
250.0000 mL | Freq: Once | INTRAVENOUS | Status: AC
  Administered 2023-05-26: 250 mL via INTRAVENOUS

## 2023-05-26 MED ORDER — ASPIRIN 81 MG PO TBEC
81.0000 mg | DELAYED_RELEASE_TABLET | Freq: Every morning | ORAL | Status: DC
  Administered 2023-05-27 – 2023-05-31 (×5): 81 mg via ORAL
  Filled 2023-05-26 (×5): qty 1

## 2023-05-26 MED ORDER — SENNA 8.6 MG PO TABS: 1.0000 | ORAL_TABLET | Freq: Every day | ORAL | Status: DC | PRN

## 2023-05-26 MED ORDER — TAMSULOSIN HCL 0.4 MG PO CAPS
0.4000 mg | ORAL_CAPSULE | Freq: Every day | ORAL | Status: DC
  Administered 2023-05-26 – 2023-05-30 (×5): 0.4 mg via ORAL
  Filled 2023-05-26 (×5): qty 1

## 2023-05-26 MED ORDER — GABAPENTIN 100 MG PO CAPS
200.0000 mg | ORAL_CAPSULE | Freq: Every day | ORAL | Status: DC
  Administered 2023-05-26 – 2023-05-30 (×5): 200 mg via ORAL
  Filled 2023-05-26 (×5): qty 2

## 2023-05-26 NOTE — Progress Notes (Signed)
 CODE SEPSIS - PHARMACY COMMUNICATION  **Broad Spectrum Antibiotics should be administered within 1 hour of Sepsis diagnosis**  Time Code Sepsis Called/Page Received: 1934  Antibiotics Ordered: Ceftriaxone  & metronidazole   Time of 1st antibiotic administration: 2021  Additional action taken by pharmacy: N/A  Page Boast 05/26/2023  7:43 PM

## 2023-05-26 NOTE — ED Notes (Signed)
 Antibiotics delayed due to difficulty establishing a line and finding pump channel to run medicine

## 2023-05-26 NOTE — ED Triage Notes (Signed)
 Pt bib REMS for weakness for a few days, no appetite, dialysis pt, low grade fever.

## 2023-05-26 NOTE — Progress Notes (Signed)
 Pt declined CPAP stating he does not wear one at home and last sleep study was years ago.

## 2023-05-26 NOTE — H&P (Signed)
 History and Physical    Carlos Gill ZOX:[id number] DOB: [birth year] DOA: 05/26/2023  PCP: Suan Elm, MD   Patient coming from: Home   Chief Complaint: Loss of appetite, fatigue, malaise, chills  HPI: Carlos Gill is a 83 y.o. male with medical history significant for hypertension, hyperlipidemia, OSA on CPAP, insulin -dependent diabetes mellitus, and ESRD on peritoneal dialysis who presents with loss of appetite, fatigue, and general malaise.  Patient complains of feeling generally poor with fatigue, loss of appetite, and chills for few days.  He denies cough, shortness of breath, chest pain, abdominal pain, flank pain, or dysuria but does note suprapubic discomfort.  There has not been any headache or neck stiffness.  ED Course: Upon arrival to the ED, patient is found to be febrile to 39.2 C and saturating well on room air with mild tachypnea, normal HR, and stable BP.  Labs are most notable for potassium 3.4, BUN 53, WBC 10,800, and lactic acid 2.0.  Urine notable for bacteriuria, pyuria, and nitrites.  Blood and urine cultures were collected and the patient was treated with acetaminophen , Rocephin , and Flagyl .  Nephrology (Dr. Yvonnie Heritage) was consulted by the ED physician and recommended admission to [facility] for peritoneal dialysis.  Review of Systems:  All other systems reviewed and apart from HPI, are negative.  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 Last  Assessment & Plan:  Formatting of this note is different from the original.  - We will continue Flomax .   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   Obesity 04/11/2017   Osteoarthritis 11/11/2008   Other chest pain 04/21/2021   Proteinuria 01/21/2021   Rhinitis, chronic 03/09/2021   Shortness of breath 04/21/2021   Sleep apnea    cpap   Trochanteric bursitis of right hip 04/27/2017   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 His smoking use included cigarettes. He has never used smokeless tobacco. He reports that he does not drink alcohol  and does not use drugs.  Allergies  Allergen Reactions   Statins Swelling and Other (See Comments)    Muscle aches  Tape Rash    Pt has rash on L arm from tape "used with stitches"    Wound Dressing Adhesive Dermatitis    Pt has rash on L arm from tape "used with stitches"    Family History  Problem Relation Age of Onset   Diabetes Mother    Prostate cancer Maternal  Grandfather    Colon cancer Neg Hx      Prior to Admission medications   Medication Sig Start Date End Date Taking? Authorizing Provider  acetaminophen  (TYLENOL ) 500 MG tablet Take 1,000 mg by mouth daily as needed for moderate pain.   Yes [provider]  aspirin  EC 81 MG tablet Take 81 mg by mouth every morning.   Yes [provider]  calcitRIOL  (ROCALTROL ) 0.5 MCG capsule Take 0.5 mcg by mouth 2 (two) times daily. 02/13/21  Yes [provider]  Carboxymethylcellulose Sod PF (THERATEARS PF) 0.25 % SOLN Place 1 drop into both eyes in the morning. 12/30/20  Yes [provider]  ezetimibe  (ZETIA ) 10 MG tablet Take 10 mg by mouth daily.   Yes [provider]  fluticasone (FLONASE) 50 MCG/ACT nasal spray Place 1 spray into both nostrils daily as needed for rhinitis. 03/09/21  Yes [provider]  furosemide  (LASIX ) 40 MG tablet Take 40 mg by mouth 2 (two) times daily. 01/19/21  Yes [provider]  gabapentin  (NEURONTIN ) 100 MG capsule Take 100-200 mg by mouth See admin instructions. Take 100 mg in the morning and 200 mg at bedtime 02/08/19  Yes [provider]  hydrALAZINE  (APRESOLINE ) 100 MG tablet Take 100 mg by mouth 3 (three) times daily. 08/19/22  Yes [provider]  insulin  aspart (NOVOLOG ) 100 UNIT/ML injection Inject 6-18 Units into the skin 3 (three) times daily with meals. Per sliding scale   Yes [provider]  insulin  glargine (LANTUS ) 100 units/mL SOLN Inject 40 Units into the skin 2 (two) times daily.   Yes [provider]  metoprolol  tartrate (LOPRESSOR ) 50 MG tablet Take 75-100 mg by mouth See admin instructions. Take 100 mg in the morning and 75 mg at bedtime   Yes [provider]  Multiple Vitamins-Minerals (PRESERVISION AREDS 2) CAPS Take 1 capsule by mouth 2 (two) times daily.   Yes [provider]  omeprazole (PRILOSEC) 20 MG capsule Take 20 mg by mouth daily.   Yes  [provider]  tamsulosin  (FLOMAX ) 0.4 MG CAPS capsule Take 0.4 mg by mouth at bedtime.   Yes [provider]  Continuous Glucose Sensor (FREESTYLE LIBRE 2 SENSOR) MISC as directed topically change every 14 days to monitor blood glucose continuously 06/24/21   [provider]    Physical Exam: Vitals:   05/26/23 1935 05/26/23 1944 05/26/23 2030 05/26/23 2130  BP: (!) 185/83  (!) 187/121 (!) 152/75  Pulse: 91  95 93  Resp: (!) 25     Temp: (!) 102 F (38.9 C) (!) 102.5 F (39.2 C)    TempSrc: Axillary Rectal    SpO2: 99%  99% 100%  Weight:      Height:        Constitutional: NAD, calm  Eyes: PERTLA, lids and conjunctivae normal ENMT: Mucous membranes are moist. Posterior pharynx clear of any exudate or lesions.   Neck: supple, no masses  Respiratory: no wheezing, no crackles. No accessory muscle use.  Cardiovascular: S1 & S2 heard, regular rate and rhythm. Pretibial pitting edema bilaterally.    Abdomen: Soft, suprapubic tenderness, no  guarding. Bowel sounds active.  Musculoskeletal: no clubbing / cyanosis. No joint deformity upper and lower extremities.   Skin: no significant rashes, lesions, ulcers. Warm, dry, well-perfused. Neurologic: CN 2-12 grossly intact. Moving all extremities. Alert and oriented to person, place, and situation.  Psychiatric: Pleasant. Cooperative.    Labs and Imaging on Admission: I have personally reviewed following labs and imaging studies  CBC: Recent Labs  Lab 05/26/23 2017  WBC 10.8*  NEUTROABS 8.6*  HGB 12.5*  HCT 39.4  MCV 83.8  PLT 165   Basic Metabolic Panel: Recent Labs  Lab 05/26/23 2017  NA 139  K 3.4*  CL 105  CO2 21*  GLUCOSE 146*  BUN 53*  CREATININE 3.90*  CALCIUM 9.0   GFR: Estimated Creatinine Clearance: 17.6 mL/min (A) (by C-G formula based on SCr of 3.9 mg/dL (H)). Liver Function Tests: Recent Labs  Lab 05/26/23 2017  AST 22  ALT 26  ALKPHOS 76  BILITOT 0.8  PROT 7.0  ALBUMIN  3.6   No results for input(s): "LIPASE", "AMYLASE" in the last 168 hours. No results for input(s): "AMMONIA" in the last 168 hours. Coagulation Profile: Recent Labs  Lab 05/26/23 2017  INR 1.0   Cardiac Enzymes: No results for input(s): "CKTOTAL", "CKMB", "CKMBINDEX", "TROPONINI" in the last 168 hours. BNP (last 3 results) No results for input(s): "PROBNP" in the last 8760 hours. HbA1C: No results for input(s): "HGBA1C" in the last 72 hours. CBG: No results for input(s): "GLUCAP" in the last 168 hours. Lipid Profile: No results for input(s): "CHOL", "HDL", "LDLCALC", "TRIG", "CHOLHDL", "LDLDIRECT" in the last 72 hours. Thyroid Function Tests: No results for input(s): "TSH", "T4TOTAL", "FREET4", "T3FREE", "THYROIDAB" in the last 72 hours. Anemia Panel: No results for input(s): "VITAMINB12", "FOLATE", "FERRITIN", "TIBC", "IRON", "RETICCTPCT" in the last 72 hours. Urine analysis:    Component Value Date/Time   COLORURINE YELLOW 05/26/2023 1946   APPEARANCEUR HAZY (A) 05/26/2023 1946   LABSPEC 1.011 05/26/2023 1946   PHURINE 7.0 05/26/2023 1946   GLUCOSEU 50 (A) 05/26/2023 1946   HGBUR MODERATE (A) 05/26/2023 1946   BILIRUBINUR NEGATIVE 05/26/2023 1946   KETONESUR NEGATIVE 05/26/2023 1946   PROTEINUR 100 (A) 05/26/2023 1946   UROBILINOGEN 0.2 05/19/2014 1847   NITRITE POSITIVE (A) 05/26/2023 1946   LEUKOCYTESUR SMALL (A) 05/26/2023 1946   Sepsis Labs: @LABRCNTIP (procalcitonin:4,lacticidven:4) ) Recent Results (from the past 240 hours)  Resp panel by RT-PCR (RSV, Flu A&B, Covid) Anterior Nasal Swab     Status: None   Collection Time: 05/26/23  7:46 PM   Specimen: Anterior Nasal Swab  Result Value Ref Range Status   SARS Coronavirus 2 by RT PCR NEGATIVE NEGATIVE Final    Comment: (NOTE) SARS-CoV-2 target nucleic acids are NOT DETECTED.  The SARS-CoV-2 RNA is generally detectable in upper respiratory specimens during the acute phase of infection. The  lowest concentration of SARS-CoV-2 viral copies this assay can detect is 138 copies/mL. A negative result does not preclude SARS-Cov-2 infection and should not be used as the sole basis for treatment or other patient management decisions. A negative result may occur with  improper specimen collection/handling, submission of specimen other than nasopharyngeal swab, presence of viral mutation(s) within the areas targeted by this assay, and inadequate number of viral copies(<138 copies/mL). A negative result must be combined with clinical observations, patient history, and epidemiological information. The expected result is Negative.  Fact Sheet for Patients:  BloggerCourse.com  Fact Sheet for Healthcare Providers:  SeriousBroker.it  This test is  no t yet approved or cleared by the [place]  FDA and  has been authorized for detection and/or diagnosis of SARS-CoV-2 by FDA under an Emergency Use Authorization (EUA). This EUA will remain  in effect (meaning this test can be used) for the duration of the COVID-19 declaration under Section 564(b)(1) of the Act, 21 U.S.C.section 360bbb-3(b)(1), unless the authorization is terminated  or revoked sooner.       Influenza A by PCR NEGATIVE NEGATIVE Final   Influenza B by PCR NEGATIVE NEGATIVE Final    Comment: (NOTE) The Xpert Xpress SARS-CoV-2/FLU/RSV plus assay is intended as an aid in the diagnosis of influenza from Nasopharyngeal swab specimens and should not be used as a sole basis for treatment. Nasal washings and aspirates are unacceptable for Xpert Xpress SARS-CoV-2/FLU/RSV testing.  Fact Sheet for Patients: BloggerCourse.com  Fact Sheet for Healthcare Providers: SeriousBroker.it  This test is not yet approved or cleared by the [place]  FDA and has been authorized for detection and/or diagnosis of SARS-CoV-2 by FDA under  an Emergency Use Authorization (EUA). This EUA will remain in effect (meaning this test can be used) for the duration of the COVID-19 declaration under Section 564(b)(1) of the Act, 21 U.S.C. section 360bbb-3(b)(1), unless the authorization is terminated or revoked.     Resp Syncytial Virus by PCR NEGATIVE NEGATIVE Final    Comment: (NOTE) Fact Sheet for Patients: BloggerCourse.com  Fact Sheet for Healthcare Providers: SeriousBroker.it  This test is not yet approved or cleared by the [place]  FDA and has been authorized for detection and/or diagnosis of SARS-CoV-2 by FDA under an Emergency Use Authorization (EUA). This EUA will remain in effect (meaning this test can be used) for the duration of the COVID-19 declaration under Section 564(b)(1) of the Act, 21 U.S.C. section 360bbb-3(b)(1), unless the authorization is terminated or revoked.  Performed at [facility], [address] [place], [place] [address]      Radiological Exams on Admission: DG Chest Port 1 View Result Date: 05/26/2023 CLINICAL DATA:  Weakness EXAM: PORTABLE CHEST 1 VIEW COMPARISON:  10/17/2022 FINDINGS: Mild atelectasis or infiltrate at left base. Stable cardiomediastinal silhouette. No pneumothorax IMPRESSION: Mild atelectasis or infiltrate at left base. Electronically Signed   By: Esmeralda Hedge M.D.   On: 05/26/2023 21:30    EKG: Independently reviewed. Sinus rhythm, chronic LBBB.   Assessment/Plan   1. Sepsis d/t UTI - Continue empiric antibiotics, trend lactate, follow cultures and clinical course    2. ESRD  - Renally-dose medications, restrict fluids, transfer for [facility] for peritoneal dialysis   3. Insulin -dependent DM  - A1c was 7.9% in September 2024  - Check CBGs, continue long- and short-acting insulin    4. Hypertension  - Continue hydralazine  and metoprolol  as tolerated   5. OSA  - CPAP while sleeping    DVT prophylaxis: sq heparin    Code Status: Full  Level of Care: Level of care: Progressive Family Communication: None present   Disposition Plan:  Patient is from: home  Anticipated d/c is to: TBD Anticipated d/c date is: 05/29/23  Patient currently: Pending cultures, clinical improvement  Consults called: Nephrology  Admission status: Inpatient     Walton Guppy, MD [facility]  05/26/2023, 9:53 PM

## 2023-05-26 NOTE — Sepsis Progress Note (Signed)
 Elink monitoring for the code sepsis protocol.

## 2023-05-26 NOTE — ED Provider Notes (Signed)
 [place] EMERGENCY DEPARTMENT AT [facility] Provider Note   CSN: [id number] Arrival date & time: 05/26/23  1854     History {Add pertinent medical, surgical, social history, OB history to HPI:1} Chief Complaint  Patient presents with   Weakness    Carlos Gill is a 83 y.o. male.  Patient has history of peritoneal dialysis and has been having fever and aches   Weakness      Home Medications Prior to Admission medications   Medication Sig Start Date End Date Taking? Authorizing Provider  acetaminophen  (TYLENOL ) 500 MG tablet Take 1,000 mg by mouth daily as needed for moderate pain.   Yes [provider]  aspirin  EC 81 MG tablet Take 81 mg by mouth every morning.   Yes [provider]  calcitRIOL  (ROCALTROL ) 0.5 MCG capsule Take 0.5 mcg by mouth 2 (two) times daily. 02/13/21  Yes [provider]  Carboxymethylcellulose Sod PF (THERATEARS PF) 0.25 % SOLN Place 1 drop into both eyes in the morning. 12/30/20  Yes [provider]  ezetimibe  (ZETIA ) 10 MG tablet Take 10 mg by mouth daily.   Yes [provider]  fluticasone (FLONASE) 50 MCG/ACT nasal spray Place 1 spray into both nostrils daily as needed for rhinitis. 03/09/21  Yes [provider]  furosemide  (LASIX ) 40 MG tablet Take 40 mg by mouth 2 (two) times daily. 01/19/21  Yes [provider]  gabapentin  (NEURONTIN ) 100 MG capsule Take 100-200 mg by mouth See admin instructions. Take 100 mg in the morning and 200 mg at bedtime 02/08/19  Yes [provider]  hydrALAZINE  (APRESOLINE ) 100 MG tablet Take 100 mg by mouth 3 (three) times daily. 08/19/22  Yes [provider]  insulin  aspart (NOVOLOG ) 100 UNIT/ML injection Inject 6-18 Units into the skin 3 (three) times daily with meals. Per sliding scale   Yes [provider]  insulin  glargine (LANTUS ) 100 units/mL SOLN Inject 40 Units into the skin 2 (two) times daily.   Yes [provider]  metoprolol  tartrate (LOPRESSOR ) 50 MG tablet Take 75-100 mg by mouth See admin instructions. Take 100 mg in the morning and 75 mg at bedtime   Yes [provider]  Multiple Vitamins-Minerals (PRESERVISION AREDS 2) CAPS Take 1 capsule by mouth 2 (two) times daily.   Yes [provider]  omeprazole (PRILOSEC) 20 MG capsule Take 20 mg by mouth daily.   Yes [provider]  tamsulosin  (FLOMAX ) 0.4 MG CAPS capsule Take 0.4 mg by mouth at bedtime.   Yes [provider]  Continuous Glucose Sensor (FREESTYLE LIBRE 2 SENSOR) MISC as directed topically change every 14 days to monitor blood glucose continuously 06/24/21   [provider]      Allergies    Statins, Tape, and Wound dressing adhesive    Review of Systems   Review of Systems  Neurological:  Positive for weakness.    Physical Exam Updated Vital Signs BP (!) 152/75   Pulse 93   Temp (!) 102.5 F (39.2 C) (Rectal)   Resp (!) 25   Ht 6' (1.829 m)   Wt 100 kg   SpO2 100%   BMI 29.90 kg/m  Physical Exam  ED Results / Procedures / Treatments   Labs (all labs ordered are listed, but only abnormal results are displayed) Labs Reviewed  LACTIC ACID, PLASMA - Abnormal; Notable for the following components:      Result Value   Lactic Acid, Venous 2.0 (*)  All other components within normal limits  COMPREHENSIVE METABOLIC PANEL WITH GFR - Abnormal; Notable for the following components:   Potassium 3.4 (*)    CO2 21 (*)    Glucose, Bld 146 (*)    BUN 53 (*)    Creatinine, Ser 3.90 (*)    GFR, Estimated 15 (*)    All other components within normal limits  CBC WITH DIFFERENTIAL/PLATELET - Abnormal; Notable for the following components:   WBC 10.8 (*)    Hemoglobin 12.5 (*)    Neutro Abs 8.6 (*)    All other components within normal limits  URINALYSIS, W/ REFLEX TO CULTURE (INFECTION SUSPECTED) - Abnormal; Notable for the following components:   APPearance HAZY (*)     Glucose, UA 50 (*)    Hgb urine dipstick MODERATE (*)    Protein, ur 100 (*)    Nitrite POSITIVE (*)    Leukocytes,Ua SMALL (*)    Bacteria, UA FEW (*)    All other components within normal limits  RESP PANEL BY RT-PCR (RSV, FLU A&B, COVID)  [id number]  CULTURE, BLOOD (ROUTINE X 2)  CULTURE, BLOOD (ROUTINE X 2)  URINE CULTURE  PROTIME-INR  LACTIC ACID, PLASMA    EKG None  Radiology DG Chest Port 1 View Result Date: 05/26/2023 CLINICAL DATA:  Weakness EXAM: PORTABLE CHEST 1 VIEW COMPARISON:  10/17/2022 FINDINGS: Mild atelectasis or infiltrate at left base. Stable cardiomediastinal silhouette. No pneumothorax IMPRESSION: Mild atelectasis or infiltrate at left base. Electronically Signed   By: Esmeralda Hedge M.D.   On: 05/26/2023 21:30    Procedures Procedures  {Document cardiac monitor, telemetry assessment procedure when appropriate:1}  Medications Ordered in ED Medications  acetaminophen  (TYLENOL ) tablet 650 mg (650 mg Oral Given 05/26/23 2022)  cefTRIAXone  (ROCEPHIN ) 2 g in sodium chloride  0.9 % 100 mL IVPB (0 g Intravenous Stopped 05/26/23 2116)  metroNIDAZOLE  (FLAGYL ) IVPB 500 mg (0 mg Intravenous Stopped 05/26/23 2121)  sodium chloride  0.9 % bolus 250 mL (0 mLs Intravenous Stopped 05/26/23 2116)    ED Course/ Medical Decision Making/ A&P   {   Click here for ABCD2, HEART and other calculatorsREFRESH Note before signing :1}                              Medical Decision Making Amount and/or Complexity of Data Reviewed Labs: ordered. Radiology: ordered.  Risk OTC drugs. Prescription drug management. Decision regarding hospitalization.   Patient with a UTI.  He will be admitted to [facility] because of the peritoneal dialysis  {Document critical care time when appropriate:1} {Document review of labs and clinical decision tools ie heart score, Chads2Vasc2 etc:1}  {Document your independent review of radiology images, and any outside records:1} {Document your discussion  with family members, caretakers, and with consultants:1} {Document social determinants of health affecting pt's care:1} {Document your decision making why or why not admission, treatments were needed:1} Final Clinical Impression(s) / ED Diagnoses Final diagnoses:  Acute cystitis with hematuria    Rx / DC Orders ED Discharge Orders     None

## 2023-05-27 DIAGNOSIS — N390 Urinary tract infection, site not specified: Secondary | ICD-10-CM | POA: Diagnosis present

## 2023-05-27 DIAGNOSIS — N185 Chronic kidney disease, stage 5: Secondary | ICD-10-CM | POA: Diagnosis not present

## 2023-05-27 DIAGNOSIS — A419 Sepsis, unspecified organism: Secondary | ICD-10-CM | POA: Diagnosis not present

## 2023-05-27 DIAGNOSIS — I10 Essential (primary) hypertension: Secondary | ICD-10-CM | POA: Diagnosis not present

## 2023-05-27 DIAGNOSIS — G4733 Obstructive sleep apnea (adult) (pediatric): Secondary | ICD-10-CM | POA: Diagnosis not present

## 2023-05-27 LAB — CBC
HCT: 39.5 % (ref 39.0–52.0)
Hemoglobin: 12.1 g/dL — ABNORMAL LOW (ref 13.0–17.0)
MCH: 26.7 pg (ref 26.0–34.0)
MCHC: 30.6 g/dL (ref 30.0–36.0)
MCV: 87.2 fL (ref 80.0–100.0)
Platelets: 153 10*3/uL (ref 150–400)
RBC: 4.53 MIL/uL (ref 4.22–5.81)
RDW: 15.5 % (ref 11.5–15.5)
WBC: 15.1 10*3/uL — ABNORMAL HIGH (ref 4.0–10.5)
nRBC: 0 % (ref 0.0–0.2)

## 2023-05-27 LAB — CBG MONITORING, ED
Glucose-Capillary: 44 mg/dL — CL (ref 70–99)
Glucose-Capillary: 144 mg/dL — ABNORMAL HIGH (ref 70–99)
Glucose-Capillary: 144 mg/dL — ABNORMAL HIGH (ref 70–99)

## 2023-05-27 LAB — BASIC METABOLIC PANEL WITH GFR
Anion gap: 12 (ref 5–15)
BUN: 48 mg/dL — ABNORMAL HIGH (ref 8–23)
CO2: 21 mmol/L — ABNORMAL LOW (ref 22–32)
Calcium: 8.5 mg/dL — ABNORMAL LOW (ref 8.9–10.3)
Chloride: 107 mmol/L (ref 98–111)
Creatinine, Ser: 3.64 mg/dL — ABNORMAL HIGH (ref 0.61–1.24)
GFR, Estimated: 16 mL/min — ABNORMAL LOW (ref >60)
Glucose, Bld: 44 mg/dL — CL (ref 70–99)
Potassium: 3.3 mmol/L — ABNORMAL LOW (ref 3.5–5.1)
Sodium: 140 mmol/L (ref 135–145)

## 2023-05-27 LAB — MAGNESIUM: Magnesium: 1.9 mg/dL (ref 1.7–2.4)

## 2023-05-27 LAB — PHOSPHORUS: Phosphorus: 3.1 mg/dL (ref 2.5–4.6)

## 2023-05-27 LAB — GLUCOSE, CAPILLARY
Glucose-Capillary: 174 mg/dL — ABNORMAL HIGH (ref 70–99)
Glucose-Capillary: 133 mg/dL — ABNORMAL HIGH (ref 70–99)

## 2023-05-27 MED ORDER — DEXTROSE 50 % IV SOLN
1.0000 | Freq: Once | INTRAVENOUS | Status: AC
  Administered 2023-05-27: 50 mL via INTRAVENOUS
  Filled 2023-05-27: qty 50

## 2023-05-27 MED ORDER — INSULIN GLARGINE-YFGN 100 UNIT/ML ~~LOC~~ SOLN
15.0000 [IU] | Freq: Every day | SUBCUTANEOUS | Status: DC
  Administered 2023-05-28 – 2023-05-30 (×4): 15 [IU] via SUBCUTANEOUS
  Filled 2023-05-27 (×6): qty 0.15

## 2023-05-27 MED ORDER — DEXTROSE 50 % IV SOLN
50.0000 mL | Freq: Once | INTRAVENOUS | Status: AC
  Administered 2023-05-27: 50 mL via INTRAVENOUS
  Filled 2023-05-27: qty 50

## 2023-05-27 MED ORDER — INSULIN ASPART 100 UNIT/ML IJ SOLN
3.0000 [IU] | Freq: Three times a day (TID) | INTRAMUSCULAR | Status: DC
  Administered 2023-05-27 – 2023-05-31 (×12): 3 [IU] via SUBCUTANEOUS

## 2023-05-27 MED ORDER — DELFLEX-LC/2.5% DEXTROSE 394 MOSM/L IP SOLN: INTRAPERITONEAL | Status: DC

## 2023-05-27 MED ORDER — GENTAMICIN SULFATE 0.1 % EX CREA
1.0000 | TOPICAL_CREAM | Freq: Every day | CUTANEOUS | Status: DC
  Administered 2023-05-28 – 2023-05-30 (×3): 1 via TOPICAL
  Filled 2023-05-27 (×2): qty 15

## 2023-05-27 NOTE — Consult Note (Signed)
 ESRD Consult Note  Reason for consult: ESRD, provision of peritoneal dialysis  Assessment/Recommendations:  ESRD  -outpatient HD orders: [facility] (Dr. Carrolyn Clan). [facility] Sun, Mon, Wed, Fri nightly. 8 hrs, 4 cycles, 2500cc fill volume, target dwell time ~1 hr . No last fill. Typically uses all 2.5% bags. -PD nightly. Will order, can start tonight once he gets to [facility]. Of note, makes quite a bit of urine hence he's not doing PD every  night.  Sepsis secondary to UTI -abx per primary service, on rocephin   Volume/ hypertension  -UF as tolerated, resume home meds  Anemia of Chronic Kidney Disease -Hemoglobin at goal for ESRD -Transfuse PRN for Hgb <7  Secondary Hyperparathyroidism/Hyperphosphatemia - resume home binders if on any, po4 at goal   DM2 with hypoglycemia -per primary service  # Additional recommendations: - Dose all meds for creatinine clearance < 10 ml/min  - Unless absolutely necessary, no MRIs with gadolinium.  - Implement save arm precautions.  Prefer needle sticks in the dorsum of the hands or wrists.  No blood pressure measurements in arm. - If blood transfusion is requested during hemodialysis sessions, please alert us  prior to the session.  - If a hemodialysis catheter line culture is requested, please alert us  as only hemodialysis nurses are able to collect those specimens.   Recommendations were discussed with the primary team.  Cristi Donalds, MD [facility]  History of Present Illness: Carlos Gill is a/an 83 y.o. male with a past medical history of ESRD on PD, HTN, IDDM2, OSA on CPAP who presents to [facility] with loss of appetite, fatigue, malaise for few days.  Did have suprapubic discomfort.  In the ER, urine was notable for bacteriuria, pyuria, nitrates.  Started on Rocephin  and Flagyl  in the ER, maintained on Rocephin .  On-call nephrologist was notified overnight, pending transfer to [facility] for PD. Patient seen and examined in the ER at [facility].  Still feels weak but otherwise no complaints. Started PD in Jan. Does have AVF in LUE but never been used. Makes quite a bit of urine hence doing PD only 4 days a week. Does report some diarrhea but no constipation. He otherwise reports that PD has been going well without any issues. Denies any chest pain, SOB, abd pain, cloudy effluent.   Medications:  Current Facility-Administered Medications  Medication Dose Route Frequency Provider Last Rate Last Admin   acetaminophen  (TYLENOL ) tablet 650 mg  650 mg Oral Q6H PRN Opyd, Timothy S, MD       Or   acetaminophen  (TYLENOL ) suppository 650 mg  650 mg Rectal Q6H PRN Opyd, Timothy S, MD       aspirin  EC tablet 81 mg  81 mg Oral q morning Opyd, Timothy S, MD   81 mg at 05/27/23 1008   calcitRIOL  (ROCALTROL ) capsule 0.5 mcg  0.5 mcg Oral BID Opyd, Timothy S, MD   0.5 mcg at 05/26/23 2219   cefTRIAXone  (ROCEPHIN ) 2 g in sodium chloride  0.9 % 100 mL IVPB  2 g Intravenous Q24H Opyd, Timothy S, MD       gabapentin  (NEURONTIN ) capsule 100 mg  100 mg Oral Daily Chappell, Alex B, RPH   100 mg at 05/27/23 1008   And   gabapentin  (NEURONTIN ) capsule 200 mg  200 mg Oral QHS Chappell, Alex B, RPH   200 mg at 05/26/23 2218   heparin  injection 5,000 Units  5,000 Units Subcutaneous Q8H Opyd, Timothy S, MD   5,000 Units at 05/27/23 [phone]  hydrALAZINE  (APRESOLINE ) tablet 100 mg  100 mg Oral TID Opyd, Timothy S, MD   100 mg at 05/27/23 1009   insulin  aspart (novoLOG ) injection 0-6 Units  0-6 Units Subcutaneous TID WC Opyd, Timothy S, MD       insulin  aspart (novoLOG ) injection 3 Units  3 Units Subcutaneous TID WC Johnson, Clanford L, MD       insulin  glargine-yfgn (SEMGLEE ) injection 15 Units  15 Units Subcutaneous QHS Johnson, Clanford L, MD       metoprolol  tartrate (LOPRESSOR ) tablet 100 mg  100 mg Oral Daily Chappell, Alex B, RPH   100 mg at 05/27/23 1008   And   metoprolol  tartrate (LOPRESSOR ) tablet 75 mg  75 mg Oral QHS Chappell, Alex B, RPH       oxyCODONE   (Oxy IR/ROXICODONE ) immediate release tablet 2.5-5 mg  2.5-5 mg Oral Q4H PRN Opyd, Timothy S, MD       pantoprazole  (PROTONIX ) EC tablet 40 mg  40 mg Oral Daily Opyd, Timothy S, MD   40 mg at 05/27/23 1009   prochlorperazine  (COMPAZINE ) injection 5 mg  5 mg Intravenous Q6H PRN Opyd, Timothy S, MD       senna (SENOKOT) tablet 8.6 mg  1 tablet Oral Daily PRN Opyd, Timothy S, MD       sodium chloride  flush (NS) 0.9 % injection 3 mL  3 mL Intravenous Q12H Opyd, Timothy S, MD   3 mL at 05/27/23 1011   tamsulosin  (FLOMAX ) capsule 0.4 mg  0.4 mg Oral QHS Opyd, Timothy S, MD   0.4 mg at 05/26/23 2218   Current Outpatient Medications  Medication Sig Dispense Refill   acetaminophen  (TYLENOL ) 500 MG tablet Take 1,000 mg by mouth daily as needed for moderate pain.     aspirin  EC 81 MG tablet Take 81 mg by mouth every morning.     calcitRIOL  (ROCALTROL ) 0.5 MCG capsule Take 0.5 mcg by mouth 2 (two) times daily.     Carboxymethylcellulose Sod PF (THERATEARS PF) 0.25 % SOLN Place 1 drop into both eyes in the morning.     ezetimibe  (ZETIA ) 10 MG tablet Take 10 mg by mouth daily.     fluticasone (FLONASE) 50 MCG/ACT nasal spray Place 1 spray into both nostrils daily as needed for rhinitis.     furosemide  (LASIX ) 40 MG tablet Take 40 mg by mouth 2 (two) times daily.     gabapentin  (NEURONTIN ) 100 MG capsule Take 100-200 mg by mouth See admin instructions. Take 100 mg in the morning and 200 mg at bedtime     hydrALAZINE  (APRESOLINE ) 100 MG tablet Take 100 mg by mouth 3 (three) times daily.     insulin  aspart (NOVOLOG ) 100 UNIT/ML injection Inject 6-18 Units into the skin 3 (three) times daily with meals. Per sliding scale     insulin  glargine (LANTUS ) 100 units/mL SOLN Inject 40 Units into the skin 2 (two) times daily.     metoprolol  tartrate (LOPRESSOR ) 50 MG tablet Take 75-100 mg by mouth See admin instructions. Take 100 mg in the morning and 75 mg at bedtime     Multiple Vitamins-Minerals (PRESERVISION AREDS 2)  CAPS Take 1 capsule by mouth 2 (two) times daily.     omeprazole (PRILOSEC) 20 MG capsule Take 20 mg by mouth daily.     tamsulosin  (FLOMAX ) 0.4 MG CAPS capsule Take 0.4 mg by mouth at bedtime.     Continuous Glucose Sensor (FREESTYLE LIBRE 2 SENSOR) MISC as directed topically change  every 14 days to monitor blood glucose continuously       ALLERGIES Statins, Tape, and Wound dressing adhesive  MEDICAL HISTORY Past Medical History:  Diagnosis Date   Abnormal result of other cardiovascular function study 04/21/2021   Abnormality of plasma protein, unspecified 04/21/2021   Acute kidney failure, unspecified (HCC) 04/21/2021   Acute on chronic diastolic (congestive) heart failure (HCC) 04/21/2021   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   Adverse effect of antihyperlipidemic and antiarteriosclerotic drugs, initial encounter 12/19/2017   Benign prostatic hyperplasia with urinary obstruction 04/21/2021   Formatting of this note might be different from the original. Last Assessment & Plan:  Formatting of this note is different from the original.  - We will continue Flomax .   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   Obesity 04/11/2017   Osteoarthritis 11/11/2008   Other chest pain 04/21/2021   Proteinuria 01/21/2021   Rhinitis, chronic 03/09/2021   Shortness of breath 04/21/2021   Sleep apnea    cpap   Trochanteric bursitis of right hip 04/27/2017   Type 2 diabetes mellitus with diabetic  nephropathy (HCC) 11/05/2010     SOCIAL HISTORY Social History   Socioeconomic History   Marital status: Married    Spouse name: Not on file   Number of children: Not on file   Years of education: Not on file   Highest education level: Not on file  Occupational History   Not on file  Tobacco Use   Smoking status: Former    Current packs/day: 0.00    Types: Cigarettes    Quit date: 02/01/1990    Years since quitting: 33.3   Smokeless tobacco: Never  Vaping Use   Vaping status: Never Used  Substance and Sexual Activity   Alcohol  use: No   Drug use: No   Sexual activity: Not on file  Other Topics Concern   Not on file  Social History Narrative   Not on file   Social Drivers of Health   Financial Resource Strain: Low Risk  (03/07/2023)   Received from [facility]   Overall Financial Resource Strain (CARDIA)    Difficulty of Paying Living Expenses: Not hard at all  Food Insecurity: No Food Insecurity (03/07/2023)   Received from [facility]   Hunger Vital Sign    Worried About Running Out of Food in the Last Year: Never true    Ran Out of Food in the Last Year: Never true  Transportation Needs: No Transportation Needs (03/07/2023)   Received from [facility] - Transportation    Lack of Transportation (Medical): No    Lack of Transportation (Non-Medical): No  Physical Activity: Not on file  Stress: No Stress Concern Present (11/10/2021)   Received from [facility], [facility] - Occupational Stress Questionnaire    Feeling of Stress : Only a little  Social Connections: Unknown (06/16/2021)   Received from [facility], [facility]   Social Network    Social Network: Not on file  Intimate Partner Violence: Not At Risk (01/31/2023)   Received from [facility]   HITS    Over the last 12 months how often did your partner physically hurt you?: Never    Over the last 12 months how often did your partner insult  you or talk down to you?: Never    Over the last 12 months how often did your partner threaten you with physical harm?: Never    Over the last 12 months how often did your partner scream or curse at you?: Never     FAMILY HISTORY Family History  Problem Relation Age of Onset   Diabetes Mother    Prostate cancer Maternal Grandfather    Colon cancer Neg Hx      Review of Systems: 12 systems were reviewed and negative except per HPI  Physical Exam: Vitals:   05/27/23 1045 05/27/23 1100  BP: (!) 156/65 (!) 155/62  Pulse: 71 67  Resp: 20 (!) 9  Temp:    SpO2: 99% 98%   No intake/output data recorded.  Intake/Output Summary (Last 24 hours) at 05/27/2023 1110 Last data filed at 05/27/2023 0600 Gross per 24 hour  Intake 432.05 ml  Output 950 ml  Net -517.95 ml   General: well-appearing, no acute distress HEENT: anicteric sclera, MMM CV: normal rate, no murmurs, no edema Lungs: bilateral chest rise, normal wob Abd: soft, non-tender, non-distended, PD cath site c/d/i Skin: no visible lesions or rashes Psych: alert, engaged, appropriate mood and affect Neuro: normal speech, no gross focal deficits  Dialysis access: LUE AVF +b/t  Test Results Reviewed Lab Results  Component Value Date   NA 140 05/27/2023   K 3.3 (L) 05/27/2023   CL 107 05/27/2023   CO2 21 (L) 05/27/2023   BUN 48 (H) 05/27/2023   CREATININE 3.64 (H) 05/27/2023   CALCIUM 8.5 (L) 05/27/2023   ALBUMIN 3.6 05/26/2023   PHOS 3.1 05/27/2023    I have reviewed relevant outside healthcare records

## 2023-05-27 NOTE — ED Notes (Signed)
 Infiltration of line observed after D50 pushed for low blood sugar. IV removed and [facility] pharmacy contacted. Pharmacist recommendations are marking the site and applying warm compresses along side observation. Site circled and warm compress applied.

## 2023-05-27 NOTE — TOC Initial Note (Signed)
 Transition of Care [facility]) - Initial/Assessment Note    Patient Details  Name: Carlos Gill MRN: [id number] Date of Birth: 1940-12-02  [facility] CM/SW Contact:    Linnea Richards, LCSW Phone Number: 05/27/2023, 10:28 AM  Clinical Narrative:                  Pt admitted from home. He has a high readmission risk score. Per MD, pt will transfer to [facility] for [facility] PD needs.  Pt has been able to manage care at home prior to admission. Anticipating he will return home at dc possibly with [facility].  [facility] notification complete. Reference number is [id number].  [facility] TOC will further assess and follow.   Expected Discharge Plan: Home w Home Health Services Barriers to Discharge: Continued Medical Work up   Patient Goals and CMS Choice Patient states their goals for this hospitalization and ongoing recovery are:: get better          Expected Discharge Plan and Services In-house Referral: Clinical Social Work     Living arrangements for the past 2 months: Single Family Home                                      Prior Living Arrangements/Services Living arrangements for the past 2 months: Single Family Home Lives with:: Spouse Patient language and need for interpreter reviewed:: Yes Do you feel safe going back to the place where you live?: Yes      Need for Family Participation in Patient Care: No (Comment)     Criminal Activity/Legal Involvement Pertinent to Current Situation/Hospitalization: No - Comment as needed  Activities of Daily Living      Permission Sought/Granted                  Emotional Assessment Appearance:: Appears stated age     Orientation: : Oriented to Self, Oriented to Place, Oriented to  Time, Oriented to Situation Alcohol  / Substance Use: Not Applicable Psych Involvement: No (comment)  Admission diagnosis:  Sepsis secondary to UTI (HCC) [A41.9, N39.0] UTI (urinary tract infection) [N39.0] Patient Active  Problem List   Diagnosis Date Noted   UTI (urinary tract infection) 05/27/2023   Sepsis secondary to UTI (HCC) 05/26/2023   Generalized weakness 10/17/2022   Anemia of chronic disease 10/17/2022   Elevated brain natriuretic peptide (BNP) level 10/17/2022   Chronic kidney disease, stage V (HCC) 10/17/2022   Mixed hyperlipidemia 10/17/2022   Hypoalbuminemia due to protein-calorie malnutrition (HCC) 10/17/2022   OSA on CPAP 10/17/2022   Elevated troponin 10/17/2022   Peripheral neuropathy 10/17/2022   Hyperglycemia due to diabetes mellitus (HCC) 11/09/2021   Epididymitis 11/09/2021   Chronic kidney disease, stage 4 (severe) (HCC) 11/09/2021   Sepsis due to gram-negative UTI (HCC) 11/02/2021   Acute pyelonephritis 11/02/2021   Essential hypertension 11/02/2021   GERD without esophagitis 11/02/2021   Dyslipidemia 11/02/2021   Renal mass, right 11/02/2021   Benign prostatic hyperplasia with urinary obstruction 04/21/2021   Acute kidney failure, unspecified (HCC) 04/21/2021   Shortness of breath 04/21/2021   Other chest pain 04/21/2021   Macular degeneration 04/21/2021   Enlarged prostate 04/21/2021   Bilateral pseudophakia 04/21/2021   Acute on chronic diastolic (congestive) heart failure (HCC) 04/21/2021   Abnormality of plasma protein, unspecified 04/21/2021   Abnormal result of other cardiovascular function study 04/21/2021   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  Rhinitis, chronic 03/09/2021   Proteinuria 01/21/2021   Nonexudative age-related macular degeneration 04/19/2018   Adverse effect of antihyperlipidemic and antiarteriosclerotic drugs, initial encounter 12/19/2017   Trochanteric bursitis of right hip 04/27/2017   Obesity (BMI 30-39.9) 04/11/2017   Fatigue 12/23/2015   Chronic anemia 08/15/2015   Uncontrolled type 2 diabetes mellitus with hyperglycemia, with long-term current use of insulin  (HCC) 11/05/2010   Osteoarthritis 11/11/2008   PCP:  Suan Elm,  MD Pharmacy:   [facility] [phone] - [place], [place] - [address] [place] [address] Phone: [phone] Fax: [phone]     Social Drivers of Health (SDOH) Social History: SDOH Screenings   Food Insecurity: No Food Insecurity (03/07/2023)   Received from [employer]  Housing: Low Risk  (03/07/2023)   Received from [facility]  Transportation Needs: No Transportation Needs (03/07/2023)   Received from [facility]  Utilities: Not At Risk (03/07/2023)   Received from [facility]  Financial Resource Strain: Low Risk  (03/07/2023)   Received from [facility]  Social Connections: Unknown (06/16/2021)   Received from [facility], [facility]  Stress: No Stress Concern Present (11/10/2021)   Received from [facility], [facility]  Tobacco Use: Medium Risk (05/26/2023)   SDOH Interventions:     Readmission Risk Interventions    05/27/2023   10:26 AM  Readmission Risk Prevention Plan  Transportation Screening Complete  HRI or Home Care Consult Complete  Palliative Care Screening Not Applicable  Medication Review (RN Care Manager) Complete

## 2023-05-27 NOTE — Progress Notes (Signed)
 PROGRESS NOTE   Carlos Gill  ZOX:[id number] DOB: [birth year] DOA: 05/26/2023 PCP: Suan Elm, MD   Chief Complaint  Patient presents with   Weakness   Level of care: Telemetry Medical  Brief Admission History:  83 y.o. male with medical history significant for hypertension, hyperlipidemia, OSA on CPAP, insulin -dependent diabetes mellitus, and ESRD on peritoneal dialysis who presents with loss of appetite, fatigue, and general malaise.   Patient complains of feeling generally poor with fatigue, loss of appetite, and chills for few days.  He denies cough, shortness of breath, chest pain, abdominal pain, flank pain, or dysuria but does note suprapubic discomfort.  There has not been any headache or neck stiffness.   ED Course: Upon arrival to the ED, patient is found to be febrile to 39.2 C and saturating well on room air with mild tachypnea, normal HR, and stable BP.  Labs are most notable for potassium 3.4, BUN 53, WBC 10,800, and lactic acid 2.0.  Urine notable for bacteriuria, pyuria, and nitrites.   Blood and urine cultures were collected and the patient was treated with acetaminophen , Rocephin , and Flagyl .  Nephrology (Dr. Yvonnie Heritage) was consulted by the ED physician and recommended admission to [facility] for peritoneal dialysis.   Assessment and Plan:  Sepsis d/t UTI - Continue high dose ceftriaxone , trend lactate, follow cultures and clinical course     ESRD on PD  - Renally-dose medications, restrict fluids, transfer for [facility] for peritoneal dialysis, nephrologist Yvonnie Heritage) was already notified of admission    Insulin -dependent DM  Hypoglycemia from Insulin  - Pt had a low CBG down to 44 this morning, had received 40 units of glargine - reduced glargine from 40 down to 15 units, novolog  4 units TID with meals eaten >50%   - Check CBGs 5x per day  CBG (last 3)  Recent Labs    05/26/23 2217 05/27/23 0653 05/27/23 0742  GLUCAP 115* 44* 144*   Hypertension  -  Continue hydralazine  and metoprolol  as tolerated    OSA  - CPAP while sleeping    DVT prophylaxis: sq heparin   Code Status: Full  Family Communication:  Disposition: transfer to [facility] when bed ready   Consultants:  Nephrology   Procedures:   Antimicrobials:  Ceftriaxone  4/25>>   Subjective: Pt says he feels tired and weak.   Objective: Vitals:   05/27/23 0530 05/27/23 0545 05/27/23 0600 05/27/23 0745  BP: (!) 168/83 (!) 176/77 (!) 168/62 (!) 154/70  Pulse: 94 93 94 84  Resp: (!) 24 17  (!) 29  Temp:      TempSrc:      SpO2: 98% 97% 97% 98%  Weight:      Height:        Intake/Output Summary (Last 24 hours) at 05/27/2023 0853 Last data filed at 05/27/2023 0600 Gross per 24 hour  Intake 432.05 ml  Output 950 ml  Net -517.95 ml   Filed Weights   05/26/23 1905  Weight: 100 kg   Examination:  General exam: Appears frail, elderly, slow mentation, calm and comfortable  Respiratory system: Clear to auscultation. Respiratory effort normal. Cardiovascular system: normal S1 & S2 heard. No JVD, murmurs, rubs, gallops or clicks. No pedal edema. Gastrointestinal system: Abdomen is nondistended, soft and nontender. No organomegaly or masses felt. Normal bowel sounds heard. Central nervous system: Alert and oriented to person, place, situation. No focal neurological deficits. Extremities: Symmetric 5 x 5 power. Skin: No rashes, lesions or ulcers. Psychiatry:  Mood & affect  flat.    Data Reviewed: I have personally reviewed following labs and imaging studies  CBC: Recent Labs  Lab 05/26/23 2017 05/27/23 0511  WBC 10.8* 15.1*  NEUTROABS 8.6*  --   HGB 12.5* 12.1*  HCT 39.4 39.5  MCV 83.8 87.2  PLT 165 153    Basic Metabolic Panel: Recent Labs  Lab 05/26/23 2017 05/27/23 0511  NA 139 140  K 3.4* 3.3*  CL 105 107  CO2 21* 21*  GLUCOSE 146* 44*  BUN 53* 48*  CREATININE 3.90* 3.64*  CALCIUM 9.0 8.5*  MG  --  1.9  PHOS  --  3.1    CBG: Recent Labs  Lab  05/26/23 2217 05/27/23 0653 05/27/23 0742  GLUCAP 115* 44* 144*    Recent Results (from the past 240 hours)  Resp panel by RT-PCR (RSV, Flu A&B, Covid) Anterior Nasal Swab     Status: None   Collection Time: 05/26/23  7:46 PM   Specimen: Anterior Nasal Swab  Result Value Ref Range Status   SARS Coronavirus 2 by RT PCR NEGATIVE NEGATIVE Final    Comment: (NOTE) SARS-CoV-2 target nucleic acids are NOT DETECTED.  The SARS-CoV-2 RNA is generally detectable in upper respiratory specimens during the acute phase of infection. The lowest concentration of SARS-CoV-2 viral copies this assay can detect is 138 copies/mL. A negative result does not preclude SARS-Cov-2 infection and should not be used as the sole basis for treatment or other patient management decisions. A negative result may occur with  improper specimen collection/handling, submission of specimen other than nasopharyngeal swab, presence of viral mutation(s) within the areas targeted by this assay, and inadequate number of viral copies(<138 copies/mL). A negative result must be combined with clinical observations, patient history, and epidemiological information. The expected result is Negative.  Fact Sheet for Patients:  BloggerCourse.com  Fact Sheet for Healthcare Providers:  SeriousBroker.it  This test is no t yet approved or cleared by the [place]  FDA and  has been authorized for detection and/or diagnosis of SARS-CoV-2 by FDA under an Emergency Use Authorization (EUA). This EUA will remain  in effect (meaning this test can be used) for the duration of the COVID-19 declaration under Section 564(b)(1) of the Act, 21 U.S.C.section 360bbb-3(b)(1), unless the authorization is terminated  or revoked sooner.       Influenza A by PCR NEGATIVE NEGATIVE Final   Influenza B by PCR NEGATIVE NEGATIVE Final    Comment: (NOTE) The Xpert Xpress SARS-CoV-2/FLU/RSV plus  assay is intended as an aid in the diagnosis of influenza from Nasopharyngeal swab specimens and should not be used as a sole basis for treatment. Nasal washings and aspirates are unacceptable for Xpert Xpress SARS-CoV-2/FLU/RSV testing.  Fact Sheet for Patients: BloggerCourse.com  Fact Sheet for Healthcare Providers: SeriousBroker.it  This test is not yet approved or cleared by the [place]  FDA and has been authorized for detection and/or diagnosis of SARS-CoV-2 by FDA under an Emergency Use Authorization (EUA). This EUA will remain in effect (meaning this test can be used) for the duration of the COVID-19 declaration under Section 564(b)(1) of the Act, 21 U.S.C. section 360bbb-3(b)(1), unless the authorization is terminated or revoked.     Resp Syncytial Virus by PCR NEGATIVE NEGATIVE Final    Comment: (NOTE) Fact Sheet for Patients: BloggerCourse.com  Fact Sheet for Healthcare Providers: SeriousBroker.it  This test is not yet approved or cleared by the [place]  FDA and has been authorized for detection and/or diagnosis of SARS-CoV-2  by FDA under an Emergency Use Authorization (EUA). This EUA will remain in effect (meaning this test can be used) for the duration of the COVID-19 declaration under Section 564(b)(1) of the Act, 21 U.S.C. section 360bbb-3(b)(1), unless the authorization is terminated or revoked.  Performed at [facility], [address], [place], [place] [address]     Radiology Studies: [facility] Iv Chest [facility] 1 View Result Date: 05/26/2023 CLINICAL DATA:  Weakness EXAM: PORTABLE CHEST 1 VIEW COMPARISON:  10/17/2022 FINDINGS: Mild atelectasis or infiltrate at left base. Stable cardiomediastinal silhouette. No pneumothorax IMPRESSION: Mild atelectasis or infiltrate at left base. Electronically Signed   By: Esmeralda Hedge M.D.   On: 05/26/2023 21:30   Scheduled  Meds:  aspirin  EC  81 mg Oral q morning   calcitRIOL   0.5 mcg Oral BID   gabapentin   100 mg Oral Daily   And   gabapentin   200 mg Oral QHS   heparin   5,000 Units Subcutaneous Q8H   hydrALAZINE   100 mg Oral TID   insulin  aspart  0-6 Units Subcutaneous TID WC   insulin  aspart  3 Units Subcutaneous TID WC   insulin  glargine-yfgn  15 Units Subcutaneous QHS   metoprolol  tartrate  100 mg Oral Daily   And   metoprolol  tartrate  75 mg Oral QHS   pantoprazole   40 mg Oral Daily   sodium chloride  flush  3 mL Intravenous Q12H   tamsulosin   0.4 mg Oral QHS   Continuous Infusions:  cefTRIAXone  (ROCEPHIN )  IV      LOS: 1 day   Time spent: 54 mins  Elianis Fischbach Lincoln Renshaw, MD How to contact the [facility] Attending or Consulting provider 7A - 7P or covering provider during after hours 7P -7A, for this patient?  Check the care team in [facility] and look for a) attending/consulting [facility] provider listed and b) the [facility] team listed Log into [email or web address] to find provider on call.  Locate the [facility] provider you are looking for under [facility] and page to a number that you can be directly reached. If you still have difficulty reaching the provider, please page the [facility] (Director on Call) for the Hospitalists listed on amion for assistance.  05/27/2023, 8:53 AM

## 2023-05-27 NOTE — Hospital Course (Signed)
 83 y.o. male with medical history significant for hypertension, hyperlipidemia, OSA on CPAP, insulin -dependent diabetes mellitus, and ESRD on peritoneal dialysis who presents with loss of appetite, fatigue, and general malaise.   Patient complains of feeling generally poor with fatigue, loss of appetite, and chills for few days.  He denies cough, shortness of breath, chest pain, abdominal pain, flank pain, or dysuria but does note suprapubic discomfort.  There has not been any headache or neck stiffness.   ED Course: Upon arrival to the ED, patient is found to be febrile to 39.2 C and saturating well on room air with mild tachypnea, normal HR, and stable BP.  Labs are most notable for potassium 3.4, BUN 53, WBC 10,800, and lactic acid 2.0.  Urine notable for bacteriuria, pyuria, and nitrites.   Blood and urine cultures were collected and the patient was treated with acetaminophen , Rocephin , and Flagyl .  Nephrology (Dr. Yvonnie Heritage) was consulted by the ED physician and recommended admission to [facility] for peritoneal dialysis.

## 2023-05-27 NOTE — Inpatient Diabetes Management (Signed)
 Inpatient Diabetes Program Recommendations  AACE/ADA: New Consensus Statement on Inpatient Glycemic Control  Target Ranges:  Prepandial:   less than 140 mg/dL      Peak postprandial:   less than 180 mg/dL (1-2 hours)      Critically ill patients:  140 - 180 mg/dL    Latest Reference Range & Units 05/26/23 22:17 05/27/23 06:53 05/27/23 07:42  Glucose-Capillary 70 - 99 mg/dL 161 (H) 44 (LL) 096 (H)    Latest Reference Range & Units 05/26/23 20:17 05/27/23 05:11  Glucose 70 - 99 mg/dL 045 (H) 44 (LL)   Review of Glycemic Control  Diabetes history: DM2 Outpatient Diabetes medications: Lantus  40 units BID, Novolog  6-18 units TID, FreeStyle Libre 2 Current orders for Inpatient glycemic control: Semglee  15 units at bedtime, Novolog  0-6 units TID with meals, Novolog  3 units TID with meals  Inpatient Diabetes Program Recommendations:    Insulin : Patient received Semglee  40 units last night and lab glucose 44 mg/dl at 4:09 am today and fingerstick 44 mg/dl at 8:11 am today. Noted Semglee  decreased to 15 units at bedtime this morning.   NOTE: Per home medication list, patient took Lantus  40 units yesterday morning (takes it BID outpatient per list) and patient received Semglee  40 units last night.  Thanks, Beacher Limerick, RN, MSN, CDCES Diabetes Coordinator Inpatient Diabetes Program [phone] (Team Pager from 8am to 5pm)

## 2023-05-27 NOTE — Procedures (Signed)
 PD tx initation note:    PD treatment initiated via aseptic technique.   Consent signed and in chart. Patient is alert and oriented. No complaints of pain.  No specimen collected. .  PD exit site dressing clean, dry and intact. Gentamycin ordered and new dressing will be applied in the am.   Bedside RN educated on PD machine and how to contact tech support when PD machine alarms.

## 2023-05-27 NOTE — ED Provider Notes (Signed)
 Glucose has resulted at 44.  I have ordered dextrose  intravenously.   Alissa April, MD 05/27/23 [phone]

## 2023-05-27 NOTE — Progress Notes (Signed)
   05/27/23 2027  BiPAP/CPAP/SIPAP  Reason BIPAP/CPAP not in use Non-compliant (patient refused use of CPAP)  BiPAP/CPAP /SiPAP Vitals  Temp (!) 101.7 F (38.7 C)  Pulse Rate 80  Resp 18  BP (!) 161/60  SpO2 99 %  MEWS Score/Color  MEWS Score 2  MEWS Score Color Yellow

## 2023-05-28 DIAGNOSIS — A419 Sepsis, unspecified organism: Secondary | ICD-10-CM | POA: Diagnosis not present

## 2023-05-28 DIAGNOSIS — E1165 Type 2 diabetes mellitus with hyperglycemia: Secondary | ICD-10-CM | POA: Diagnosis not present

## 2023-05-28 DIAGNOSIS — R6520 Severe sepsis without septic shock: Secondary | ICD-10-CM | POA: Insufficient documentation

## 2023-05-28 DIAGNOSIS — I10 Essential (primary) hypertension: Secondary | ICD-10-CM | POA: Diagnosis not present

## 2023-05-28 DIAGNOSIS — G4733 Obstructive sleep apnea (adult) (pediatric): Secondary | ICD-10-CM | POA: Diagnosis not present

## 2023-05-28 LAB — CBC
HCT: 34.7 % — ABNORMAL LOW (ref 39.0–52.0)
Hemoglobin: 11.3 g/dL — ABNORMAL LOW (ref 13.0–17.0)
MCH: 27.1 pg (ref 26.0–34.0)
MCHC: 32.6 g/dL (ref 30.0–36.0)
MCV: 83.2 fL (ref 80.0–100.0)
Platelets: 146 10*3/uL — ABNORMAL LOW (ref 150–400)
RBC: 4.17 MIL/uL — ABNORMAL LOW (ref 4.22–5.81)
RDW: 15.4 % (ref 11.5–15.5)
WBC: 12.4 10*3/uL — ABNORMAL HIGH (ref 4.0–10.5)
nRBC: 0 % (ref 0.0–0.2)

## 2023-05-28 LAB — RENAL FUNCTION PANEL
Albumin: 2.6 g/dL — ABNORMAL LOW (ref 3.5–5.0)
Anion gap: 7 (ref 5–15)
BUN: 40 mg/dL — ABNORMAL HIGH (ref 8–23)
CO2: 26 mmol/L (ref 22–32)
Calcium: 8.5 mg/dL — ABNORMAL LOW (ref 8.9–10.3)
Chloride: 108 mmol/L (ref 98–111)
Creatinine, Ser: 4.05 mg/dL — ABNORMAL HIGH (ref 0.61–1.24)
GFR, Estimated: 14 mL/min — ABNORMAL LOW (ref >60)
Glucose, Bld: 154 mg/dL — ABNORMAL HIGH (ref 70–99)
Phosphorus: 3.1 mg/dL (ref 2.5–4.6)
Potassium: 3 mmol/L — ABNORMAL LOW (ref 3.5–5.1)
Sodium: 141 mmol/L (ref 135–145)

## 2023-05-28 LAB — GLUCOSE, CAPILLARY
Glucose-Capillary: 130 mg/dL — ABNORMAL HIGH (ref 70–99)
Glucose-Capillary: 131 mg/dL — ABNORMAL HIGH (ref 70–99)
Glucose-Capillary: 140 mg/dL — ABNORMAL HIGH (ref 70–99)
Glucose-Capillary: 148 mg/dL — ABNORMAL HIGH (ref 70–99)
Glucose-Capillary: 158 mg/dL — ABNORMAL HIGH (ref 70–99)
Glucose-Capillary: 210 mg/dL — ABNORMAL HIGH (ref 70–99)

## 2023-05-28 MED ORDER — DELFLEX-LC/2.5% DEXTROSE 394 MOSM/L IP SOLN: INTRAPERITONEAL | Status: DC

## 2023-05-28 MED ORDER — POTASSIUM CHLORIDE CRYS ER 20 MEQ PO TBCR
60.0000 meq | EXTENDED_RELEASE_TABLET | Freq: Once | ORAL | Status: AC
  Administered 2023-05-28: 60 meq via ORAL
  Filled 2023-05-28: qty 3

## 2023-05-28 MED ORDER — GENTAMICIN SULFATE 0.1 % EX CREA: 1.0000 | TOPICAL_CREAM | Freq: Every day | CUTANEOUS | Status: DC

## 2023-05-28 MED ORDER — ACETAMINOPHEN 325 MG PO TABS
650.0000 mg | ORAL_TABLET | Freq: Once | ORAL | Status: AC
  Administered 2023-05-28: 650 mg via ORAL
  Filled 2023-05-28: qty 2

## 2023-05-28 NOTE — Progress Notes (Signed)
 PROGRESS NOTE  DELORE MENNER OZD:[id number] DOB: 1940/12/05   PCP: Suan Elm, MD  Patient is from: Home.  Lives with wife.  Independently ambulates at baseline.  DOA: 05/26/2023 LOS: 2  Chief complaints Chief Complaint  Patient presents with   Weakness     Brief Narrative / Interim history: 83 year old M with PMH of ESRD on PD, IDDM-2, OSA noncompliant with CPAP, HTN, BPH and HLD presenting with decreased appetite, fatigue, malaise and chills for few days, and admitted with working diagnosis of sepsis due to urinary tract infection.  In ED, febrile to 39.2 C.  Had mild tachypnea and lactic acid of 2.0.  WBC 10.8 K.  UA consistent with UTI.  Cultures obtained.  Patient was started on antibiotics.  Nephrology consulted and recommended transfer to [facility] for PD.  Urine culture with GNR.  Blood cultures NGTD.  Remains on IV ceftriaxone    Subjective: Seen and examined earlier this morning.  No major events overnight of this morning.  No complaints.  Denies URI, GI or UTI symptoms other than chronic urgency.  Objective: Vitals:   05/28/23 0045 05/28/23 0300 05/28/23 0509 05/28/23 0812  BP:   (!) 139/54 (!) 128/55  Pulse:   81 76  Resp:   18   Temp: (!) 101 F (38.3 C) 100.1 F (37.8 C) 100.2 F (37.9 C) 99.2 F (37.3 C)  TempSrc:    Oral  SpO2:   93% 98%  Weight:      Height:        Examination:  GENERAL: No apparent distress.  Nontoxic. HEENT: MMM.  Vision and hearing grossly intact.  NECK: Supple.  No apparent JVD.  RESP:  No IWOB.  Fair aeration bilaterally. CVS:  RRR. Heart sounds normal.  ABD/GI/GU: BS+. Abd soft, NTND.  PD cath in place. MSK/EXT:  Moves extremities. No apparent deformity. No edema.  SKIN: no apparent skin lesion or wound NEURO: Awake, alert and oriented appropriately.  No apparent focal neuro deficit. PSYCH: Calm. Normal affect.   Consultants:  Nephrology  Procedures: None  Microbiology summarized: COVID-19, influenza and RSV PCR  nonreactive Blood cultures NGTD Urine culture with GNR  Assessment and plan: Severe sepsis due to GNR UTI: Present on arrival.  Febrile with tachypnea, leukocytosis and lactic acidosis.  UA consistent with UTI.  Urine culture with GNR.  Blood cultures NGTD.  Still with leukocytosis and fever.  No history of MDRO. -Continue IV ceftriaxone  pending culture speciation and sensitivity. - Follow urine culture speciation and sensitivity   ESRD on PD: No signs of peritonitis on exam. -Peritoneal dialysis per nephrology.  IDDM-2 with level 2 hypoglycemia and hyperglycemia: A1c 7.9% in 10/2022 Recent Labs  Lab 05/27/23 1412 05/27/23 1720 05/28/23 0015 05/28/23 0655 05/28/23 0809  GLUCAP 174* 133* 158* 148* 131*  -Recheck hemoglobin A1c -Continue NovoLog  3 units 3 times daily with meals -Continue Semglee  15 units daily -Continue SSI-very sensitive  Hypertension: Normotensive -Continue hydralazine  and metoprolol  as tolerated    OSA not compliant with CPAP -Encourage CPAP  BPH with chronic urgency - Continue Flomax   Hypokalemia - P.o. KCl 60 mill equivalent x 1  Body mass index is 29.9 kg/m.          DVT prophylaxis:  heparin  injection 5,000 Units Start: 05/26/23 2200  Code Status: Full code Family Communication: None at bedside Level of care: Telemetry Medical Status is: Inpatient Remains inpatient appropriate because: Severe sepsis due to UTI   Final disposition: Home   55 minutes with  more than 50% spent in reviewing records, counseling patient/family and coordinating care.   Sch Meds:  Scheduled Meds:  aspirin  EC  81 mg Oral q morning   calcitRIOL   0.5 mcg Oral BID   gabapentin   100 mg Oral Daily   And   gabapentin   200 mg Oral QHS   gentamicin  cream  1 Application Topical Daily   heparin   5,000 Units Subcutaneous Q8H   hydrALAZINE   100 mg Oral TID   insulin  aspart  0-6 Units Subcutaneous TID WC   insulin  aspart  3 Units Subcutaneous TID WC   insulin   glargine-yfgn  15 Units Subcutaneous QHS   metoprolol  tartrate  100 mg Oral Daily   And   metoprolol  tartrate  75 mg Oral QHS   pantoprazole   40 mg Oral Daily   sodium chloride  flush  3 mL Intravenous Q12H   tamsulosin   0.4 mg Oral QHS   Continuous Infusions:  cefTRIAXone  (ROCEPHIN )  IV 2 g (05/27/23 2147)   dialysis solution 2.5% low-MG/low-CA     PRN Meds:.acetaminophen  **OR** acetaminophen , oxyCODONE , prochlorperazine , senna  Antimicrobials: Anti-infectives (From admission, onward)    Start     Dose/Rate Route Frequency Ordered Stop   05/27/23 2200  cefTRIAXone  (ROCEPHIN ) 2 g in sodium chloride  0.9 % 100 mL IVPB        2 g 200 mL/hr over 30 Minutes Intravenous Every 24 hours 05/26/23 2153 06/03/23 2159   05/26/23 1945  cefTRIAXone  (ROCEPHIN ) 2 g in sodium chloride  0.9 % 100 mL IVPB        2 g 200 mL/hr over 30 Minutes Intravenous Once 05/26/23 1934 05/26/23 2116   05/26/23 1945  metroNIDAZOLE  (FLAGYL ) IVPB 500 mg        500 mg 100 mL/hr over 60 Minutes Intravenous  Once 05/26/23 1934 05/26/23 2121        I have personally reviewed the following labs and images: CBC: Recent Labs  Lab 05/26/23 2017 05/27/23 0511 05/28/23 0530  WBC 10.8* 15.1* 12.4*  NEUTROABS 8.6*  --   --   HGB 12.5* 12.1* 11.3*  HCT 39.4 39.5 34.7*  MCV 83.8 87.2 83.2  PLT 165 153 146*   BMP &GFR Recent Labs  Lab 05/26/23 2017 05/27/23 0511 05/28/23 0530  NA 139 140 141  K 3.4* 3.3* 3.0*  CL 105 107 108  CO2 21* 21* 26  GLUCOSE 146* 44* 154*  BUN 53* 48* 40*  CREATININE 3.90* 3.64* 4.05*  CALCIUM 9.0 8.5* 8.5*  MG  --  1.9  --   PHOS  --  3.1 3.1   Estimated Creatinine Clearance: 16.9 mL/min (A) (by C-G formula based on SCr of 4.05 mg/dL (H)). Liver & Pancreas: Recent Labs  Lab 05/26/23 2017 05/28/23 0530  AST 22  --   ALT 26  --   ALKPHOS 76  --   BILITOT 0.8  --   PROT 7.0  --   ALBUMIN 3.6 2.6*   No results for input(s): "LIPASE", "AMYLASE" in the last 168 hours. No  results for input(s): "AMMONIA" in the last 168 hours. Diabetic: No results for input(s): "HGBA1C" in the last 72 hours. Recent Labs  Lab 05/27/23 1412 05/27/23 1720 05/28/23 0015 05/28/23 0655 05/28/23 0809  GLUCAP 174* 133* 158* 148* 131*   Cardiac Enzymes: No results for input(s): "CKTOTAL", "CKMB", "CKMBINDEX", "TROPONINI" in the last 168 hours. No results for input(s): "PROBNP" in the last 8760 hours. Coagulation Profile: Recent Labs  Lab 05/26/23 2017  INR  1.0   Thyroid Function Tests: No results for input(s): "TSH", "T4TOTAL", "FREET4", "T3FREE", "THYROIDAB" in the last 72 hours. Lipid Profile: No results for input(s): "CHOL", "HDL", "LDLCALC", "TRIG", "CHOLHDL", "LDLDIRECT" in the last 72 hours. Anemia Panel: No results for input(s): "VITAMINB12", "FOLATE", "FERRITIN", "TIBC", "IRON", "RETICCTPCT" in the last 72 hours. Urine analysis:    Component Value Date/Time   COLORURINE YELLOW 05/26/2023 1946   APPEARANCEUR HAZY (A) 05/26/2023 1946   LABSPEC 1.011 05/26/2023 1946   PHURINE 7.0 05/26/2023 1946   GLUCOSEU 50 (A) 05/26/2023 1946   HGBUR MODERATE (A) 05/26/2023 1946   BILIRUBINUR NEGATIVE 05/26/2023 1946   KETONESUR NEGATIVE 05/26/2023 1946   PROTEINUR 100 (A) 05/26/2023 1946   UROBILINOGEN 0.2 05/19/2014 1847   NITRITE POSITIVE (A) 05/26/2023 1946   LEUKOCYTESUR SMALL (A) 05/26/2023 1946   Sepsis Labs: Invalid input(s): "PROCALCITONIN", "LACTICIDVEN"  Microbiology: Recent Results (from the past 240 hours)  Resp panel by RT-PCR (RSV, Flu A&B, Covid) Anterior Nasal Swab     Status: None   Collection Time: 05/26/23  7:46 PM   Specimen: Anterior Nasal Swab  Result Value Ref Range Status   SARS Coronavirus 2 by RT PCR NEGATIVE NEGATIVE Final    Comment: (NOTE) SARS-CoV-2 target nucleic acids are NOT DETECTED.  The SARS-CoV-2 RNA is generally detectable in upper respiratory specimens during the acute phase of infection. The lowest concentration of  SARS-CoV-2 viral copies this assay can detect is 138 copies/mL. A negative result does not preclude SARS-Cov-2 infection and should not be used as the sole basis for treatment or other patient management decisions. A negative result may occur with  improper specimen collection/handling, submission of specimen other than nasopharyngeal swab, presence of viral mutation(s) within the areas targeted by this assay, and inadequate number of viral copies(<138 copies/mL). A negative result must be combined with clinical observations, patient history, and epidemiological information. The expected result is Negative.  Fact Sheet for Patients:  BloggerCourse.com  Fact Sheet for Healthcare Providers:  SeriousBroker.it  This test is no t yet approved or cleared by the [place]  FDA and  has been authorized for detection and/or diagnosis of SARS-CoV-2 by FDA under an Emergency Use Authorization (EUA). This EUA will remain  in effect (meaning this test can be used) for the duration of the COVID-19 declaration under Section 564(b)(1) of the Act, 21 U.S.C.section 360bbb-3(b)(1), unless the authorization is terminated  or revoked sooner.       Influenza A by PCR NEGATIVE NEGATIVE Final   Influenza B by PCR NEGATIVE NEGATIVE Final    Comment: (NOTE) The Xpert Xpress SARS-CoV-2/FLU/RSV plus assay is intended as an aid in the diagnosis of influenza from Nasopharyngeal swab specimens and should not be used as a sole basis for treatment. Nasal washings and aspirates are unacceptable for Xpert Xpress SARS-CoV-2/FLU/RSV testing.  Fact Sheet for Patients: BloggerCourse.com  Fact Sheet for Healthcare Providers: SeriousBroker.it  This test is not yet approved or cleared by the [place]  FDA and has been authorized for detection and/or diagnosis of SARS-CoV-2 by FDA under an Emergency Use  Authorization (EUA). This EUA will remain in effect (meaning this test can be used) for the duration of the COVID-19 declaration under Section 564(b)(1) of the Act, 21 U.S.C. section 360bbb-3(b)(1), unless the authorization is terminated or revoked.     Resp Syncytial Virus by PCR NEGATIVE NEGATIVE Final    Comment: (NOTE) Fact Sheet for Patients: BloggerCourse.com  Fact Sheet for Healthcare Providers: SeriousBroker.it  This test is not  yet approved or cleared by the [place]  FDA and has been authorized for detection and/or diagnosis of SARS-CoV-2 by FDA under an Emergency Use Authorization (EUA). This EUA will remain in effect (meaning this test can be used) for the duration of the COVID-19 declaration under Section 564(b)(1) of the Act, 21 U.S.C. section 360bbb-3(b)(1), unless the authorization is terminated or revoked.  Performed at [facility], [address] [place], [place] [address]   Urine Culture     Status: Abnormal (Preliminary result)   Collection Time: 05/26/23  7:46 PM   Specimen: Urine, Random  Result Value Ref Range Status   Specimen Description   Final    URINE, RANDOM Performed at [facility], [address], [place], [place] [address]    Special Requests   Final    NONE Reflexed from [id number] Performed at [facility], [address] [place], [place] [address]    Culture (A)  Final    >=100,000 COLONIES/mL GRAM NEGATIVE RODS SUSCEPTIBILITIES TO FOLLOW Performed at [facility], [address], [place], [place] [address]    Report Status PENDING  Incomplete  Blood Culture (routine x 2)     Status: None (Preliminary result)   Collection Time: 05/26/23  8:00 PM   Specimen: BLOOD  Result Value Ref Range Status   Specimen Description BLOOD RIGHT ANTECUBITAL  Final   Special Requests   Final    BOTTLES DRAWN AEROBIC AND ANAEROBIC Blood Culture adequate volume   Culture   Final    NO GROWTH 2  DAYS Performed at [facility], [address], [place], [place] [address]    Report Status PENDING  Incomplete  Blood Culture (routine x 2)     Status: None (Preliminary result)   Collection Time: 05/26/23  8:12 PM   Specimen: BLOOD  Result Value Ref Range Status   Specimen Description BLOOD RIGHT ANTECUBITAL  Final   Special Requests   Final    BOTTLES DRAWN AEROBIC AND ANAEROBIC Blood Culture adequate volume   Culture   Final    NO GROWTH 2 DAYS Performed at [facility], [address] [place], [place] [address]    Report Status PENDING  Incomplete    Radiology Studies: No results found.    Heather Streeper T. Sharine Cadle [facility]  If 7PM-7AM, please contact night-coverage [email or web address] 05/28/2023, 12:08 PM

## 2023-05-28 NOTE — Progress Notes (Signed)
 [facility] NEPHROLOGY PROGRESS NOTE  Assessment/ Plan: Pt is a 83 y.o. yo male   #ESRD  -outpatient HD orders: [facility] (Dr. Carrolyn Clan). CCPD Sun, Mon, Wed, Fri nightly. 8 hrs, 4 cycles, 2500cc fill volume, target dwell time ~1 hr . No last fill. Typically uses all 2.5% bags. -PD nightly.  Continue PD.   #Sepsis secondary to UTI -abx per primary service, on rocephin    #Volume/ hypertension  -UF as tolerated, resume home meds   #Anemia of Chronic Kidney Disease -Hemoglobin at goal for ESRD -Transfuse PRN for Hgb <7   #Secondary Hyperparathyroidism/Hyperphosphatemia - resume home binders if on any, po4 at goal   # Hypokalemia: Replete potassium chloride .  Subjective: Seen and examined.  Tolerated PD well.  Denies nausea, vomiting, chest pain, shortness of breath.  No new event. Objective Vital signs in last 24 hours: Vitals:   05/28/23 0045 05/28/23 0300 05/28/23 0509 05/28/23 0812  BP:   (!) 139/54 (!) 128/55  Pulse:   81 76  Resp:   18   Temp: (!) 101 F (38.3 C) 100.1 F (37.8 C) 100.2 F (37.9 C) 99.2 F (37.3 C)  TempSrc:    Oral  SpO2:   93% 98%  Weight:      Height:       Weight change:   Intake/Output Summary (Last 24 hours) at 05/28/2023 1150 Last data filed at 05/28/2023 0900 Gross per 24 hour  Intake 820 ml  Output 806 ml  Net 14 ml       Labs: RENAL PANEL Recent Labs  Lab 05/26/23 2017 05/27/23 0511 05/28/23 0530  NA 139 140 141  K 3.4* 3.3* 3.0*  CL 105 107 108  CO2 21* 21* 26  GLUCOSE 146* 44* 154*  BUN 53* 48* 40*  CREATININE 3.90* 3.64* 4.05*  CALCIUM 9.0 8.5* 8.5*  MG  --  1.9  --   PHOS  --  3.1 3.1  ALBUMIN 3.6  --  2.6*    Liver Function Tests: Recent Labs  Lab 05/26/23 2017 05/28/23 0530  AST 22  --   ALT 26  --   ALKPHOS 76  --   BILITOT 0.8  --   PROT 7.0  --   ALBUMIN 3.6 2.6*   No results for input(s): "LIPASE", "AMYLASE" in the last 168 hours. No results for input(s): "AMMONIA" in  the last 168 hours. CBC: Recent Labs    10/19/22 0555 10/22/22 1221 05/26/23 2017 05/27/23 0511 05/28/23 0530  HGB 7.2* 7.8* 12.5* 12.1* 11.3*  MCV 89.3 89.1 83.8 87.2 83.2  FERRITIN  --  125  --   --   --   TIBC  --  389  --   --   --   IRON  --  88  --   --   --     Cardiac Enzymes: No results for input(s): "CKTOTAL", "CKMB", "CKMBINDEX", "TROPONINI" in the last 168 hours. CBG: Recent Labs  Lab 05/27/23 1412 05/27/23 1720 05/28/23 0015 05/28/23 0655 05/28/23 0809  GLUCAP 174* 133* 158* 148* 131*    Iron Studies: No results for input(s): "IRON", "TIBC", "TRANSFERRIN", "FERRITIN" in the last 72 hours. Studies/Results: DG Chest Port 1 View Result Date: 05/26/2023 CLINICAL DATA:  Weakness EXAM: PORTABLE CHEST 1 VIEW COMPARISON:  10/17/2022 FINDINGS: Mild atelectasis or infiltrate at left base. Stable cardiomediastinal silhouette. No pneumothorax IMPRESSION: Mild atelectasis or infiltrate at left base. Electronically Signed   By: Esmeralda Hedge M.D.   On:  05/26/2023 21:30    Medications: Infusions:  cefTRIAXone  (ROCEPHIN )  IV 2 g (05/27/23 2147)   dialysis solution 2.5% low-MG/low-CA      Scheduled Medications:  aspirin  EC  81 mg Oral q morning   calcitRIOL   0.5 mcg Oral BID   gabapentin   100 mg Oral Daily   And   gabapentin   200 mg Oral QHS   gentamicin  cream  1 Application Topical Daily   heparin   5,000 Units Subcutaneous Q8H   hydrALAZINE   100 mg Oral TID   insulin  aspart  0-6 Units Subcutaneous TID WC   insulin  aspart  3 Units Subcutaneous TID WC   insulin  glargine-yfgn  15 Units Subcutaneous QHS   metoprolol  tartrate  100 mg Oral Daily   And   metoprolol  tartrate  75 mg Oral QHS   pantoprazole   40 mg Oral Daily   sodium chloride  flush  3 mL Intravenous Q12H   tamsulosin   0.4 mg Oral QHS    have reviewed scheduled and prn medications.  Physical Exam: General:NAD, comfortable Heart:RRR, s1s2 nl Lungs:clear b/l, no crackle Abdomen:soft, Non-tender,  non-distended Extremities:No edema Dialysis Access: PD catheter  Kaulin Chaves Prasad Kalyiah Saintil 05/28/2023,11:50 AM  LOS: 2 days

## 2023-05-28 NOTE — Progress Notes (Signed)
 PD post treatment note  PD treatment completed. Patient tolerated treatment well. PD effluent is clear. No specimen collected.  PD exit site clean, dry and intact. Patient is awake, oriented and in no acute distress.  Report given to bedside nurse.      Total UF removed:   Post treatment weight: 95.1kg    05/28/23 8295  Completion  Treatment Status Complete  Initial Drain Volume 4  Average Dwell Time-Hour(s) 1  Average Dwell Time-Min(s) 30  Average Drain Time 18  Total Therapy Volume 62130 (mL)  Total Therapy Time-Hour(s) 8  Total Therapy Time-Min(s) 56  Weight after Drain 209 lb 10.5 oz (95.1 kg)  Effluent Appearance Yellow;Clear  Fluid Balance - CCPD  Total UF (+ value on cycler, pt loss) 356 mL  Procedure Comments  Tolerated treatment well? Yes  Hand-off documentation  Hand-off Given Given to shift RN/LPN  Report given to (Full Name) Tracy Friedlander, RN

## 2023-05-28 NOTE — Progress Notes (Signed)
   05/28/23 1914  Cycler Setup  Total Number of Night Cycles 4  Night Fill Volume 2500  Dianeal Solution Dextrose  2.5% in 6000 mL Low Cal/Low Mag  Night Dwell Time per Cycle - Hour(s) 1  Night Dwell Time per Cycle - Minute(s) 30  Night Time Therapy - Minute(s) 23  Night Time Therapy - Hour(s) 8  Minimum Initial Drain Volume 0  Maximum Peritoneal Volume 3750  Night/Total Therapy Volume [address]  Day Exchange No  Completion  Treatment Status Started  Weight after Drain 218 lb 14.7 oz (99.3 kg)  Hand-off documentation  Hand-off Given Given to shift RN/LPN  Report given to (Full Name) Tracy Friedlander, RN   PD tx initation note:   Pre TX VS: see flow sheet  Pre TX weight: 99.3kg  PD treatment initiated via aseptic technique. Consent signed and in chart. Patient is alert and oriented. No complaints of pain. No specimen collected. PD exit site clean, dry and intact. Gentamycin and new dressing applied. Bedside RN educated on PD machine and how to contact tech support when PD machine alarms.PD tx initation note:

## 2023-05-29 DIAGNOSIS — A419 Sepsis, unspecified organism: Secondary | ICD-10-CM | POA: Diagnosis not present

## 2023-05-29 DIAGNOSIS — N390 Urinary tract infection, site not specified: Secondary | ICD-10-CM | POA: Diagnosis not present

## 2023-05-29 LAB — URINE CULTURE: Culture: >=100000 — AB

## 2023-05-29 LAB — RENAL FUNCTION PANEL
Albumin: 2.4 g/dL — ABNORMAL LOW (ref 3.5–5.0)
Anion gap: 11 (ref 5–15)
BUN: 46 mg/dL — ABNORMAL HIGH (ref 8–23)
CO2: 23 mmol/L (ref 22–32)
Calcium: 8.3 mg/dL — ABNORMAL LOW (ref 8.9–10.3)
Chloride: 103 mmol/L (ref 98–111)
Creatinine, Ser: 4.31 mg/dL — ABNORMAL HIGH (ref 0.61–1.24)
GFR, Estimated: 13 mL/min — ABNORMAL LOW
Glucose, Bld: 130 mg/dL — ABNORMAL HIGH (ref 70–99)
Phosphorus: 3 mg/dL (ref 2.5–4.6)
Potassium: 3.4 mmol/L — ABNORMAL LOW (ref 3.5–5.1)
Sodium: 137 mmol/L (ref 135–145)

## 2023-05-29 LAB — GLUCOSE, CAPILLARY
Glucose-Capillary: 165 mg/dL — ABNORMAL HIGH (ref 70–99)
Glucose-Capillary: 233 mg/dL — ABNORMAL HIGH (ref 70–99)
Glucose-Capillary: 143 mg/dL — ABNORMAL HIGH (ref 70–99)
Glucose-Capillary: 125 mg/dL — ABNORMAL HIGH (ref 70–99)
Glucose-Capillary: 183 mg/dL — ABNORMAL HIGH (ref 70–99)

## 2023-05-29 MED ORDER — SODIUM CHLORIDE 0.9 % IV SOLN
500.0000 mg | INTRAVENOUS | Status: DC
  Administered 2023-05-29 – 2023-05-31 (×3): 500 mg via INTRAVENOUS
  Filled 2023-05-29 (×5): qty 10

## 2023-05-29 NOTE — Progress Notes (Signed)
 PROGRESS NOTE  Carlos Gill:[id number] DOB: [birth year]   PCP: Suan Elm, MD  Patient is from: Home.  Lives with wife.  Independently ambulates at baseline.  DOA: 05/26/2023 LOS: 3  Chief complaints Chief Complaint  Patient presents with   Weakness     Brief Narrative / Interim history: 83 year old M with PMH of ESRD on PD, IDDM-2, OSA noncompliant with CPAP, HTN, BPH and HLD presenting with decreased appetite, fatigue, malaise and chills for few days, and admitted with working diagnosis of sepsis due to urinary tract infection.  In ED, febrile to 39.2 C.  Had mild tachypnea and lactic acid of 2.0.  WBC 10.8 K.  UA consistent with UTI.  Cultures obtained.  Patient was started on antibiotics.  Nephrology consulted and recommended transfer to [facility] for PD.  Urine culture with GNR.  Blood cultures NGTD.  Remains on IV ceftriaxone    Subjective: The patient was seen and examined this morning, awake alert oriented x 4 in no acute distress, hemodynamically stable she is.    Assessment and plan: Severe sepsis due to GNR UTI:  - Sepsis physiology resolved, currently hemodynamically stable  - Present on arrival.  Febrile with tachypnea, leukocytosis and lactic acidosis.  UA consistent with UTI.  Urine culture with E.  -  Blood cultures NGTD.   -Continue IV ceftriaxone  pending culture speciation and sensitivity. --Urine culture positive for E. coli,-pending sensitivity   ESRD on PD: No signs of peritonitis on exam. -Peritoneal dialysis per nephrology.  - Nephrology following  IDDM-2 with level 2 hypoglycemia and hyperglycemia: A1c 7.9% in 10/2022 Recent Labs  Lab 05/28/23 1236 05/28/23 1641 05/28/23 2123 05/29/23 0539 05/29/23 0919  GLUCAP 130* 140* 210* 165* 233*  -Recheck hemoglobin A1c -Continue NovoLog  3 units 3 times daily with meals -Continue Semglee  15>>> 18  units daily -Continue SSI-very sensitive  Hypertension: Normotensive-stable -Continue hydralazine   and metoprolol  as tolerated    OSA not compliant with CPAP -Encourage CPAP  BPH with chronic urgency - Continue Flomax   Hypokalemia - P.o. KCl 60 mill equivalent x 1  Body mass index is 28.58 kg/m.        DVT prophylaxis:  heparin  injection 5,000 Units Start: 05/26/23 2200  Code Status: Full code Family Communication: None at bedside Level of care: Telemetry Medical Status is: Inpatient Remains inpatient appropriate because: Severe sepsis due to UTI Pending sensitivity for UTI E. coli switch to p.o. antibiotics Possible discharge in a.m.   Final disposition: Home  Examination:  General:  AAO x 3,  cooperative, no distress;   HEENT:  Normocephalic, PERRL, otherwise with in Normal limits   Neuro:  CNII-XII intact. , normal motor and sensation, reflexes intact   Lungs:   Clear to auscultation BL, Respirations unlabored,  No wheezes / crackles  Cardio:    S1/S2, RRR, No murmure, No Rubs or Gallops   Abdomen:  Soft, non-tender, bowel sounds active all four quadrants, no guarding or peritoneal signs.  Muscular  skeletal:  Limited exam -global generalized weaknesses - in bed, able to move all 4 extremities,   2+ pulses,  symmetric, No pitting edema  Skin:  Dry, warm to touch, negative for any Rashes,  Wounds: Please see nursing documentation         Consultants:  Nephrology  Procedures: None  Microbiology summarized: COVID-19, influenza and RSV PCR nonreactive Blood cultures NGTD Urine culture with E. coli, pending sensitivity     Sch Meds:  Scheduled Meds:  aspirin  EC  81  mg Oral q morning   calcitRIOL   0.5 mcg Oral BID   gabapentin   100 mg Oral Daily   And   gabapentin   200 mg Oral QHS   gentamicin  cream  1 Application Topical Daily   heparin   5,000 Units Subcutaneous Q8H   hydrALAZINE   100 mg Oral TID   insulin  aspart  0-6 Units Subcutaneous TID WC   insulin  aspart  3 Units Subcutaneous TID WC   insulin  glargine-yfgn  15 Units Subcutaneous QHS    metoprolol  tartrate  100 mg Oral Daily   And   metoprolol  tartrate  75 mg Oral QHS   pantoprazole   40 mg Oral Daily   sodium chloride  flush  3 mL Intravenous Q12H   tamsulosin   0.4 mg Oral QHS   Continuous Infusions:  cefTRIAXone  (ROCEPHIN )  IV 2 g (05/28/23 2231)   dialysis solution 2.5% low-MG/low-CA     PRN Meds:.acetaminophen  **OR** acetaminophen , oxyCODONE , prochlorperazine , senna  Antimicrobials: Anti-infectives (From admission, onward)    Start     Dose/Rate Route Frequency Ordered Stop   05/27/23 2200  cefTRIAXone  (ROCEPHIN ) 2 g in sodium chloride  0.9 % 100 mL IVPB        2 g 200 mL/hr over 30 Minutes Intravenous Every 24 hours 05/26/23 2153 06/03/23 2159   05/26/23 1945  cefTRIAXone  (ROCEPHIN ) 2 g in sodium chloride  0.9 % 100 mL IVPB        2 g 200 mL/hr over 30 Minutes Intravenous Once 05/26/23 1934 05/26/23 2116   05/26/23 1945  metroNIDAZOLE  (FLAGYL ) IVPB 500 mg        500 mg 100 mL/hr over 60 Minutes Intravenous  Once 05/26/23 1934 05/26/23 2121        I have personally reviewed the following labs and images: CBC: Recent Labs  Lab 05/26/23 2017 05/27/23 0511 05/28/23 0530  WBC 10.8* 15.1* 12.4*  NEUTROABS 8.6*  --   --   HGB 12.5* 12.1* 11.3*  HCT 39.4 39.5 34.7*  MCV 83.8 87.2 83.2  PLT 165 153 146*   BMP &GFR Recent Labs  Lab 05/26/23 2017 05/27/23 0511 05/28/23 0530  NA 139 140 141  K 3.4* 3.3* 3.0*  CL 105 107 108  CO2 21* 21* 26  GLUCOSE 146* 44* 154*  BUN 53* 48* 40*  CREATININE 3.90* 3.64* 4.05*  CALCIUM 9.0 8.5* 8.5*  MG  --  1.9  --   PHOS  --  3.1 3.1   Estimated Creatinine Clearance: 16.6 mL/min (A) (by C-G formula based on SCr of 4.05 mg/dL (H)). Liver & Pancreas: Recent Labs  Lab 05/26/23 2017 05/28/23 0530  AST 22  --   ALT 26  --   ALKPHOS 76  --   BILITOT 0.8  --   PROT 7.0  --   ALBUMIN 3.6 2.6*    Diabetic: No results for input(s): "HGBA1C" in the last 72 hours. Recent Labs  Lab 05/28/23 1236  05/28/23 1641 05/28/23 2123 05/29/23 0539 05/29/23 0919  GLUCAP 130* 140* 210* 165* 233*    Recent Labs  Lab 05/26/23 2017  INR 1.0    Urine analysis:    Component Value Date/Time   COLORURINE YELLOW 05/26/2023 1946   APPEARANCEUR HAZY (A) 05/26/2023 1946   LABSPEC 1.011 05/26/2023 1946   PHURINE 7.0 05/26/2023 1946   GLUCOSEU 50 (A) 05/26/2023 1946   HGBUR MODERATE (A) 05/26/2023 1946   BILIRUBINUR NEGATIVE 05/26/2023 1946   KETONESUR NEGATIVE 05/26/2023 1946   PROTEINUR 100 (A) 05/26/2023 1946  UROBILINOGEN 0.2 05/19/2014 1847   NITRITE POSITIVE (A) 05/26/2023 1946   LEUKOCYTESUR SMALL (A) 05/26/2023 1946   Sepsis Labs: Invalid input(s): "PROCALCITONIN", "LACTICIDVEN"  Microbiology: Recent Results (from the past 240 hours)  Resp panel by RT-PCR (RSV, Flu A&B, Covid) Anterior Nasal Swab     Status: None   Collection Time: 05/26/23  7:46 PM   Specimen: Anterior Nasal Swab  Result Value Ref Range Status   SARS Coronavirus 2 by RT PCR NEGATIVE NEGATIVE Final    Comment: (NOTE) SARS-CoV-2 target nucleic acids are NOT DETECTED.  The SARS-CoV-2 RNA is generally detectable in upper respiratory specimens during the acute phase of infection. The lowest concentration of SARS-CoV-2 viral copies this assay can detect is 138 copies/mL. A negative result does not preclude SARS-Cov-2 infection and should not be used as the sole basis for treatment or other patient management decisions. A negative result may occur with  improper specimen collection/handling, submission of specimen other than nasopharyngeal swab, presence of viral mutation(s) within the areas targeted by this assay, and inadequate number of viral copies(<138 copies/mL). A negative result must be combined with clinical observations, patient history, and epidemiological information. The expected result is Negative.  Fact Sheet for Patients:  BloggerCourse.com  Fact Sheet for  Healthcare Providers:  SeriousBroker.it  This test is no t yet approved or cleared by the [place]  FDA and  has been authorized for detection and/or diagnosis of SARS-CoV-2 by FDA under an Emergency Use Authorization (EUA). This EUA will remain  in effect (meaning this test can be used) for the duration of the COVID-19 declaration under Section 564(b)(1) of the Act, 21 U.S.C.section 360bbb-3(b)(1), unless the authorization is terminated  or revoked sooner.       Influenza A by PCR NEGATIVE NEGATIVE Final   Influenza B by PCR NEGATIVE NEGATIVE Final    Comment: (NOTE) The Xpert Xpress SARS-CoV-2/FLU/RSV plus assay is intended as an aid in the diagnosis of influenza from Nasopharyngeal swab specimens and should not be used as a sole basis for treatment. Nasal washings and aspirates are unacceptable for Xpert Xpress SARS-CoV-2/FLU/RSV testing.  Fact Sheet for Patients: BloggerCourse.com  Fact Sheet for Healthcare Providers: SeriousBroker.it  This test is not yet approved or cleared by the [place]  FDA and has been authorized for detection and/or diagnosis of SARS-CoV-2 by FDA under an Emergency Use Authorization (EUA). This EUA will remain in effect (meaning this test can be used) for the duration of the COVID-19 declaration under Section 564(b)(1) of the Act, 21 U.S.C. section 360bbb-3(b)(1), unless the authorization is terminated or revoked.     Resp Syncytial Virus by PCR NEGATIVE NEGATIVE Final    Comment: (NOTE) Fact Sheet for Patients: BloggerCourse.com  Fact Sheet for Healthcare Providers: SeriousBroker.it  This test is not yet approved or cleared by the [place]  FDA and has been authorized for detection and/or diagnosis of SARS-CoV-2 by FDA under an Emergency Use Authorization (EUA). This EUA will remain in effect (meaning this  test can be used) for the duration of the COVID-19 declaration under Section 564(b)(1) of the Act, 21 U.S.C. section 360bbb-3(b)(1), unless the authorization is terminated or revoked.  Performed at [facility], [address], [place], [place] [address]   Urine Culture     Status: Abnormal (Preliminary result)   Collection Time: 05/26/23  7:46 PM   Specimen: Urine, Random  Result Value Ref Range Status   Specimen Description   Final    URINE, RANDOM Performed at [facility], [address], [place], [place] [address]    Special Requests   Final    NONE Reflexed from [phone] Performed at [facility], [address], [place], [place] [address]    Culture (A)  Final    >=100,000 COLONIES/mL ESCHERICHIA COLI SUSCEPTIBILITIES TO FOLLOW Performed at [facility], [address] [place], [place] [address]    Report Status PENDING  Incomplete  Blood Culture (routine x 2)     Status: None (Preliminary result)   Collection Time: 05/26/23  8:00 PM   Specimen: BLOOD  Result Value Ref Range Status   Specimen Description BLOOD RIGHT ANTECUBITAL  Final   Special Requests   Final    BOTTLES DRAWN AEROBIC AND ANAEROBIC Blood Culture adequate volume   Culture   Final    NO GROWTH 3 DAYS Performed at [facility], [address], [place], [place] [address]    Report Status PENDING  Incomplete  Blood Culture (routine x 2)     Status: None (Preliminary result)   Collection Time: 05/26/23  8:12 PM   Specimen: BLOOD  Result Value Ref Range Status   Specimen Description BLOOD RIGHT ANTECUBITAL  Final   Special Requests   Final    BOTTLES DRAWN AEROBIC AND ANAEROBIC Blood Culture adequate volume   Culture   Final    NO GROWTH 3 DAYS Performed at [facility], [address] [place], [place] [address]    Report Status PENDING  Incomplete    SIGNED: Bobbetta Burnet, MD, FHM. FAAFP Time spent 55 minutes,-in seeing evaluating patient, reviewing med rec, plan of care,  [facility],  Pager (please use [email or web address] to page/text)  Please use Epic Secure Chat for non-urgent communication (7AM-7PM) If 7PM-7AM, please contact night-coverage [email or web address],  05/29/2023, 12:13 PM

## 2023-05-29 NOTE — Progress Notes (Signed)
 [facility] Progress Note   Subjective:     Objective Vitals:   05/28/23 2116 05/29/23 0523 05/29/23 0730 05/29/23 0920  BP: (!) 128/50 (!) 126/51 139/66 (!) 149/65  Pulse: 79 70  75  Resp: (!) 22 20  18   Temp: 99.6 F (37.6 C) 98.4 F (36.9 C) 98 F (36.7 C)   TempSrc: Oral Oral Oral   SpO2: 97% 97% 98% 100%  Weight:  95.6 kg    Height:       Physical Exam General: Heart: Lungs: Abdomen: Extremities: Dialysis Access:   Dialysis Orders:  Additional Objective Labs: Basic Metabolic Panel: Recent Labs  Lab 05/26/23 2017 05/27/23 0511 05/28/23 0530  NA 139 140 141  K 3.4* 3.3* 3.0*  CL 105 107 108  CO2 21* 21* 26  GLUCOSE 146* 44* 154*  BUN 53* 48* 40*  CREATININE 3.90* 3.64* 4.05*  CALCIUM 9.0 8.5* 8.5*  PHOS  --  3.1 3.1   Liver Function Tests: Recent Labs  Lab 05/26/23 2017 05/28/23 0530  AST 22  --   ALT 26  --   ALKPHOS 76  --   BILITOT 0.8  --   PROT 7.0  --   ALBUMIN 3.6 2.6*   No results for input(s): "LIPASE", "AMYLASE" in the last 168 hours. CBC: Recent Labs  Lab 05/26/23 2017 05/27/23 0511 05/28/23 0530  WBC 10.8* 15.1* 12.4*  NEUTROABS 8.6*  --   --   HGB 12.5* 12.1* 11.3*  HCT 39.4 39.5 34.7*  MCV 83.8 87.2 83.2  PLT 165 153 146*   Blood Culture    Component Value Date/Time   SDES BLOOD RIGHT ANTECUBITAL 05/26/2023 2012   SPECREQUEST  05/26/2023 2012    BOTTLES DRAWN AEROBIC AND ANAEROBIC Blood Culture adequate volume   CULT  05/26/2023 2012    NO GROWTH 3 DAYS Performed at [facility], [address] [place], [place] [address]    REPTSTATUS PENDING 05/26/2023 2012    Cardiac Enzymes: No results for input(s): "CKTOTAL", "CKMB", "CKMBINDEX", "TROPONINI" in the last 168 hours. CBG: Recent Labs  Lab 05/28/23 1236 05/28/23 1641 05/28/23 2123 05/29/23 0539 05/29/23 0919  GLUCAP 130* 140* 210* 165* 233*   Iron Studies: No results for input(s): "IRON", "TIBC", "TRANSFERRIN", "FERRITIN" in the last  72 hours. @lablastinr3 @ Studies/Results: No results found. Medications:  cefTRIAXone  (ROCEPHIN )  IV 2 g (05/28/23 2231)   dialysis solution 2.5% low-MG/low-CA      aspirin  EC  81 mg Oral q morning   calcitRIOL   0.5 mcg Oral BID   gabapentin   100 mg Oral Daily   And   gabapentin   200 mg Oral QHS   gentamicin  cream  1 Application Topical Daily   heparin   5,000 Units Subcutaneous Q8H   hydrALAZINE   100 mg Oral TID   insulin  aspart  0-6 Units Subcutaneous TID WC   insulin  aspart  3 Units Subcutaneous TID WC   insulin  glargine-yfgn  15 Units Subcutaneous QHS   metoprolol  tartrate  100 mg Oral Daily   And   metoprolol  tartrate  75 mg Oral QHS   pantoprazole   40 mg Oral Daily   sodium chloride  flush  3 mL Intravenous Q12H   tamsulosin   0.4 mg Oral QHS   Physical Exam: General:Pleasant, NAD Heart:S1,S2 RRR No M/R/G Lungs:CTAB A/P Abdomen: NABS, NT Extremities:No edema Dialysis Access: PD catheter drsg intact  Assessment/ Plan: Pt is a 83 y.o. yo male   #ESRD  -outpatient HD orders: [facility] (Dr.  Bhutani). [facility] Sun, Mon, Wed, Fri nightly. 8 hrs, 4 cycles, 2500cc fill volume, target dwell time ~1 hr . No last fill. Typically uses all 2.5% bags. -PD nightly.  Continue PD.   #Sepsis secondary to GNR UTI -abx per primary service, on rocephin    #Volume/ hypertension  -UF as tolerated, resume home meds   #Anemia of Chronic Kidney Disease -Hemoglobin at goal for ESRD -Transfuse PRN for Hgb <7   #Secondary Hyperparathyroidism/Hyperphosphatemia - resume home binders if on any, po4 at goal    # Hypokalemia: Replete potassium chloride .  Harvis Mabus H. Brittany Osier NP-C 05/29/2023, 11:30 AM  [employer] [phone]

## 2023-05-29 NOTE — Progress Notes (Signed)
   05/29/23 0730  Peritoneal Catheter Mid lower abdomen  No placement date or time found.   Catheter Location: Mid lower abdomen  Site Assessment Clean, Dry, Intact;Clean;Dry;Intact  Drainage Description None  Catheter status Deaccessed;Patent  Dressing Occlusive;Gauze/Drain sponge  Dressing Status Clean, Dry, Intact  Dressing Intervention Assessed, no intervention needed  Completion  Treatment Status Complete  Initial Drain Volume 240  Average Dwell Time-Hour(s) 1  Average Dwell Time-Min(s) 30  Average Drain Time 18  Total Therapy Volume [address]  Total Therapy Time-Hour(s) 8  Total Therapy Time-Min(s) 36  Weight after Drain 216 lb 4.3 oz (98.1 kg)  Effluent Appearance Clear  Procedure Comments  Tolerated treatment well? Yes  Hand-off documentation  Hand-off Given Given to shift RN/LPN  Report given to (Full Name) Tracy Friedlander RN  Hand-off Received Received from shift RN/LPN  Report received from (Full Name) Randee Busing rn   PD post treatment note  PD treatment completed. Patient tolerated treatment well. PD effluent is clear. No specimen collected.  PD exit site clean, dry and intact. Patient is awake, oriented and in no acute distress.  Report given to bedside nurse.   Post treatment VS: 139/66  Total UF removed:     Post treatment weight: 98.1kg /bed weight

## 2023-05-29 NOTE — Plan of Care (Signed)
  Problem: Metabolic: Goal: Ability to maintain appropriate glucose levels will improve Outcome: Progressing   Problem: Nutritional: Goal: Maintenance of adequate nutrition will improve Outcome: Progressing   Problem: Skin Integrity: Goal: Risk for impaired skin integrity will decrease Outcome: Progressing   Problem: Clinical Measurements: Goal: Ability to maintain clinical measurements within normal limits will improve Outcome: Progressing Goal: Will remain free from infection Outcome: Progressing   Problem: Activity: Goal: Risk for activity intolerance will decrease Outcome: Progressing   Problem: Nutrition: Goal: Adequate nutrition will be maintained Outcome: Progressing   Problem: Pain Managment: Goal: General experience of comfort will improve and/or be controlled Outcome: Progressing   Problem: Safety: Goal: Ability to remain free from injury will improve Outcome: Progressing

## 2023-05-29 NOTE — Evaluation (Signed)
 Physical Therapy Evaluation Patient Details Name: Carlos Gill MRN: [id number] DOB: 1940/12/20 Today's Date: 05/29/2023  History of Present Illness  Pt is an 83 y.o. male who presented 05/26/23 with fever and aches. Admitted with severe sepsis due to GNR UTI. PMH includes: type 2 diabetes mellitus, hypertension, CKD stage 4, HLD, dyslipidemia and OSA on CPAP.  Clinical Impression  Pt presents with condition above and deficits mentioned below, see PT Problem List. PTA, he was independent with all functional mobility without DME, living with his wife in a 1-level house with 5 STE. An aide assists his wife, who uses a w/c for mobility. Currently, the pt demonstrates some mild generalized functional weakness along with mild balance and activity tolerance deficits. CGA-supervision was provided for safety with transfers and ambulating without UE support this date. He will likely progress quickly with increased OOB mobilization, therefore no post acute PT likely needed at d/c. Will continue to follow acutely to maximize his return to baseline prior to d/c home.      If plan is discharge home, recommend the following: Assistance with cooking/housework;Assist for transportation;Help with stairs or ramp for entrance   Can travel by private vehicle        Equipment Recommendations None recommended by PT  Recommendations for Other Services       Functional Status Assessment Patient has had a recent decline in their functional status and demonstrates the ability to make significant improvements in function in a reasonable and predictable amount of time.     Precautions / Restrictions Precautions Precautions: Fall Recall of Precautions/Restrictions: Intact Restrictions Weight Bearing Restrictions Per Provider Order: No      Mobility  Bed Mobility Overal bed mobility: Modified Independent             General bed mobility comments: HOB elevated, no assistance needed    Transfers Overall  transfer level: Needs assistance Equipment used: None Transfers: Sit to/from Stand Sit to Stand: Supervision           General transfer comment: Extra time to power up to stand, supervision for safety, no LOB    Ambulation/Gait Ambulation/Gait assistance: Contact guard assist, Supervision Gait Distance (Feet): 140 Feet Assistive device: None Gait Pattern/deviations: Step-through pattern, Decreased stride length, Decreased dorsiflexion - left, Decreased dorsiflexion - right Gait velocity: reduced Gait velocity interpretation: 1.31 - 2.62 ft/sec, indicative of limited community ambulator   General Gait Details: Pt ambulates with decreased speed and bil feet clearance, mild intermittent sway but no LOB. Pt reported feeling a little lightheaded half way through the walk. CGA-supervision for safety  Stairs            Wheelchair Mobility     Tilt Bed    Modified Rankin (Stroke Patients Only)       Balance Overall balance assessment: Mild deficits observed, not formally tested                                           Pertinent Vitals/Pain Pain Assessment Pain Assessment: Faces Faces Pain Scale: No hurt Pain Intervention(s): Monitored during session    Home Living Family/patient expects to be discharged to:: Private residence Living Arrangements: Spouse/significant other Available Help at Discharge: Family;Available 24 hours/day Type of Home: House Home Access: Stairs to enter Entrance Stairs-Rails: Right Entrance Stairs-Number of Steps: 5   Home Layout: One level Home Equipment: Shower seat  Prior Function Prior Level of Function : Independent/Modified Independent;Driving             Mobility Comments: No AD, no recent falls ADLs Comments: Aide comes to assist wife, who uses w/c     Extremity/Trunk Assessment   Upper Extremity Assessment Upper Extremity Assessment: Overall WFL for tasks assessed    Lower Extremity  Assessment Lower Extremity Assessment: Generalized weakness (noted with functional mobility; denied numbness/tingling)    Cervical / Trunk Assessment Cervical / Trunk Assessment: Normal  Communication   Communication Communication: No apparent difficulties    Cognition Arousal: Alert Behavior During Therapy: WFL for tasks assessed/performed   PT - Cognitive impairments: No apparent impairments                         Following commands: Intact       Cueing Cueing Techniques: Verbal cues     General Comments General comments (skin integrity, edema, etc.): encouraged pt to mobilize in halls with nursing 3x/day    Exercises     Assessment/Plan    PT Assessment Patient needs continued PT services  PT Problem List Decreased strength;Decreased activity tolerance;Decreased balance;Decreased mobility       PT Treatment Interventions DME instruction;Gait training;Stair training;Therapeutic activities;Functional mobility training;Therapeutic exercise;Balance training;Neuromuscular re-education;Patient/family education    PT Goals (Current goals can be found in the Care Plan section)  Acute Rehab PT Goals Patient Stated Goal: to improve and return home PT Goal Formulation: With patient Time For Goal Achievement: 06/12/23 Potential to Achieve Goals: Good    Frequency Min 1X/week     Co-evaluation               AM-PAC PT "6 Clicks" Mobility  Outcome Measure Help needed turning from your back to your side while in a flat bed without using bedrails?: None Help needed moving from lying on your back to sitting on the side of a flat bed without using bedrails?: None Help needed moving to and from a bed to a chair (including a wheelchair)?: A Little Help needed standing up from a chair using your arms (e.g., wheelchair or bedside chair)?: A Little Help needed to walk in hospital room?: A Little Help needed climbing 3-5 steps with a railing? : A Little 6 Click  Score: 20    End of Session   Activity Tolerance: Patient tolerated treatment well Patient left: in chair;with call bell/phone within reach;with chair alarm set   PT Visit Diagnosis: Unsteadiness on feet (R26.81);Other abnormalities of gait and mobility (R26.89);Muscle weakness (generalized) (M62.81);Difficulty in walking, not elsewhere classified (R26.2)    Time: 4098-1191 PT Time Calculation (min) (ACUTE ONLY): 13 min   Charges:   PT Evaluation $PT Eval Low Complexity: 1 Low   PT General Charges $$ ACUTE PT VISIT: 1 Visit         Vernida Goodie, PT, DPT Acute Rehabilitation Services  Office: [phone]   Ellyn Hack 05/29/2023, 3:33 PM

## 2023-05-30 ENCOUNTER — Inpatient Hospital Stay (HOSPITAL_COMMUNITY)

## 2023-05-30 DIAGNOSIS — N186 End stage renal disease: Secondary | ICD-10-CM | POA: Diagnosis not present

## 2023-05-30 DIAGNOSIS — B9620 Unspecified Escherichia coli [E. coli] as the cause of diseases classified elsewhere: Secondary | ICD-10-CM

## 2023-05-30 DIAGNOSIS — N390 Urinary tract infection, site not specified: Secondary | ICD-10-CM | POA: Diagnosis not present

## 2023-05-30 DIAGNOSIS — Z1612 Extended spectrum beta lactamase (ESBL) resistance: Secondary | ICD-10-CM

## 2023-05-30 DIAGNOSIS — A419 Sepsis, unspecified organism: Secondary | ICD-10-CM | POA: Diagnosis not present

## 2023-05-30 DIAGNOSIS — Z992 Dependence on renal dialysis: Secondary | ICD-10-CM

## 2023-05-30 HISTORY — PX: IR FLUORO GUIDE CV LINE RIGHT: IMG2283

## 2023-05-30 HISTORY — PX: IR US GUIDE VASC ACCESS RIGHT: IMG2390

## 2023-05-30 LAB — GLUCOSE, CAPILLARY
Glucose-Capillary: 163 mg/dL — ABNORMAL HIGH (ref 70–99)
Glucose-Capillary: 188 mg/dL — ABNORMAL HIGH (ref 70–99)
Glucose-Capillary: 145 mg/dL — ABNORMAL HIGH (ref 70–99)
Glucose-Capillary: 179 mg/dL — ABNORMAL HIGH (ref 70–99)
Glucose-Capillary: 141 mg/dL — ABNORMAL HIGH (ref 70–99)

## 2023-05-30 LAB — CBC
HCT: 33.4 % — ABNORMAL LOW (ref 39.0–52.0)
Hemoglobin: 10.6 g/dL — ABNORMAL LOW (ref 13.0–17.0)
MCH: 26.4 pg (ref 26.0–34.0)
MCHC: 31.7 g/dL (ref 30.0–36.0)
MCV: 83.3 fL (ref 80.0–100.0)
Platelets: 155 10*3/uL (ref 150–400)
RBC: 4.01 MIL/uL — ABNORMAL LOW (ref 4.22–5.81)
RDW: 15 % (ref 11.5–15.5)
WBC: 5.2 10*3/uL (ref 4.0–10.5)
nRBC: 0 % (ref 0.0–0.2)

## 2023-05-30 LAB — RENAL FUNCTION PANEL
Albumin: 2.4 g/dL — ABNORMAL LOW (ref 3.5–5.0)
Anion gap: 12 (ref 5–15)
BUN: 40 mg/dL — ABNORMAL HIGH (ref 8–23)
CO2: 22 mmol/L (ref 22–32)
Calcium: 8.5 mg/dL — ABNORMAL LOW (ref 8.9–10.3)
Chloride: 106 mmol/L (ref 98–111)
Creatinine, Ser: 4.15 mg/dL — ABNORMAL HIGH (ref 0.61–1.24)
GFR, Estimated: 14 mL/min — ABNORMAL LOW (ref >60)
Glucose, Bld: 184 mg/dL — ABNORMAL HIGH (ref 70–99)
Phosphorus: 3.1 mg/dL (ref 2.5–4.6)
Potassium: 3.1 mmol/L — ABNORMAL LOW (ref 3.5–5.1)
Sodium: 140 mmol/L (ref 135–145)

## 2023-05-30 LAB — MAGNESIUM: Magnesium: 1.9 mg/dL (ref 1.7–2.4)

## 2023-05-30 MED ORDER — LIDOCAINE-EPINEPHRINE 1 %-1:100000 IJ SOLN
20.0000 mL | Freq: Once | INTRAMUSCULAR | Status: AC
  Administered 2023-05-30: 10 mL
  Filled 2023-05-30: qty 20

## 2023-05-30 MED ORDER — MEROPENEM IV (FOR PTA / DISCHARGE USE ONLY): 500.0000 mg | INTRAVENOUS | 0 refills | Status: AC

## 2023-05-30 MED ORDER — LIDOCAINE-EPINEPHRINE 1 %-1:100000 IJ SOLN
INTRAMUSCULAR | Status: AC
  Filled 2023-05-30: qty 1

## 2023-05-30 MED ORDER — LACTINEX PO CHEW: 1.0000 | CHEWABLE_TABLET | Freq: Three times a day (TID) | ORAL | 0 refills | Status: AC

## 2023-05-30 NOTE — Procedures (Signed)
  Procedure:  Tunneled DL RIJ CVC placement under US  and fluoro tip svc/ra jct Preprocedure diagnosis: The primary encounter diagnosis was Acute cystitis with hematuria. Diagnoses of Severe sepsis (HCC), Acute cystitis without hematuria, and Sepsis secondary to UTI [facility]) were also pertinent to this visit. Postprocedure diagnosis: same EBL:    minimal Complications:   none immediate  See full dictation in [employer].  Nicky Barrack MD Main # [phone] Pager  [phone] Mobile [phone]

## 2023-05-30 NOTE — Consult Note (Signed)
 [facility]    Date of Admission:  05/26/2023     Total days of antibiotics                Reason for Consult: Urinary Tract Infection   Referring Provider: Dr. Eilene Grater Primary Care Provider: Suan Elm, MD   ASSESSMENT:  [name] is an 83 year old African-American male on peritoneal dialysis admitted with decreased appetite and low-grade fever and found to have ESBL E. coli urinary tract infection.  No evidence of bacteremia with blood cultures remaining negative.  Discussed plan of care to continue current dose of meropenem through 06/05/2023.  Given limited venous access will need tunneled catheter for duration of IV treatment.  Home health/OPAT orders.  Monitor blood cultures until finalized with adjustment of antibiotics as needed.  Peritoneal dialysis per nephrology.  Okay for discharge from ID standpoint once home health established.  Remaining medical and supportive care per internal medicine.   PLAN:  Continue current dose of meropenem through 06/05/2023. OPAT/home health orders. Tunneled catheter placement per interventional radiology and nephrology. Monitor blood cultures for bacteremia until finalized. Okay for discharge from ID standpoint. Follow-up in ID clinic. Remaining medical and supportive care per internal medicine.  Diagnosis:  ESBL E. coli UTI  Allergies  Allergen Reactions   Statins Swelling and Other (See Comments)    Muscle aches   Tape Rash    Pt has rash on L arm from tape "used with stitches"    Wound Dressing Adhesive Dermatitis    Pt has rash on L arm from tape "used with stitches"    OPAT Orders Discharge antibiotics to be given via PICC line Discharge antibiotics: Meropenem 500 mg IV every 24 Per pharmacy protocol  Aim for Vancomycin trough 15-20 or AUC 400-550 (unless otherwise indicated) Duration: 6 days End Date: 06/05/2023.  [facility] Care Per Protocol:  Home health RN for IV administration and  teaching; PICC line care and labs.    Labs weekly while on IV antibiotics: _X_ CBC with differential _X_ BMP __ CMP _X_ CRP _X_ ESR __ Vancomycin trough __ CK  __ Please pull PIC at completion of IV antibiotics __X Please leave PIC in place until doctor has seen patient or been notified  Fax weekly labs to [phone]  Clinic Follow Up Appt:  06/08/2023 at 1:30 PM with Marlan Silva, NP   Principal Problem:   Sepsis secondary to UTI [facility]) Active Problems:   Essential hypertension   Benign prostatic hyperplasia with urinary obstruction   Uncontrolled type 2 diabetes mellitus with hyperglycemia, with long-term current use of insulin  (HCC)   Chronic kidney disease, stage V (HCC)   OSA on CPAP   UTI (urinary tract infection)   Severe sepsis (HCC)    aspirin  EC  81 mg Oral q morning   calcitRIOL   0.5 mcg Oral BID   gabapentin   100 mg Oral Daily   And   gabapentin   200 mg Oral QHS   gentamicin  cream  1 Application Topical Daily   heparin   5,000 Units Subcutaneous Q8H   hydrALAZINE   100 mg Oral TID   insulin  aspart  0-6 Units Subcutaneous TID WC   insulin  aspart  3 Units Subcutaneous TID WC   insulin  glargine-yfgn  15 Units Subcutaneous QHS   metoprolol  tartrate  100 mg Oral Daily   And   metoprolol  tartrate  75 mg Oral QHS   pantoprazole   40 mg Oral Daily   sodium chloride   flush  3 mL Intravenous Q12H   tamsulosin   0.4 mg Oral QHS     HPI: Carlos Gill is a 83 y.o. male with previous medical history of hypertension, obstructive sleep apnea on CPAP, insulin -dependent diabetes mellitus, and end-stage renal disease on peritoneal dialysis presenting to the hospital with decreased appetite and low-grade fever.  [name] presented to the hospital with fever and aches that been going on for approximately 1 to 2 days prior to arrival.  Febrile on admission with a temperature of 102 F with leukocytosis and white blood cell count of 15,100. Chest x-ray with mild atelectasis.  Blood cultures have been without growth to date. Urine culture >= 100,000 colonies of ESBL E. Coli. Currently on Day 5 of antimicrobial therapy initially receiving Ceftriaxone  and transitioned to Meropenem 2 days ago.   [name] has been doing better and no further fevers and denies any other current urinary symptoms. Tolerating antibiotics with no adverse side effects.    Review of Systems: ROS   Past Medical History:  Diagnosis Date   Abnormal result of other cardiovascular function study 04/21/2021   Abnormality of plasma protein, unspecified 04/21/2021   Acute kidney failure, unspecified (HCC) 04/21/2021   Acute on chronic diastolic (congestive) heart failure (HCC) 04/21/2021   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   Adverse effect of antihyperlipidemic and antiarteriosclerotic drugs, initial encounter 12/19/2017   Benign prostatic hyperplasia with urinary obstruction 04/21/2021   Formatting of this note might be different from the original. Last Assessment & Plan:  Formatting of this note is different from the original.  - We will continue Flomax .   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   Obesity 04/11/2017   Osteoarthritis 11/11/2008   Other chest pain 04/21/2021   Proteinuria 01/21/2021   Rhinitis, chronic 03/09/2021   Shortness of breath 04/21/2021   Sleep apnea    cpap   Trochanteric bursitis of right hip 04/27/2017   Type 2 diabetes mellitus with diabetic nephropathy (HCC)  11/05/2010    Social History   Tobacco Use   Smoking status: Former    Current packs/day: 0.00    Types: Cigarettes    Quit date: 02/01/1990    Years since quitting: 33.3   Smokeless tobacco: Never  Vaping Use   Vaping status: Never Used  Substance Use Topics   Alcohol  use: No   Drug use: No    Family History  Problem Relation Age of Onset   Diabetes Mother    Prostate cancer Maternal Grandfather    Colon cancer Neg Hx     Allergies  Allergen Reactions   Statins Swelling and Other (See Comments)    Muscle aches   Tape Rash    Pt has rash on L arm from tape "used with stitches"    Wound Dressing Adhesive Dermatitis    Pt has rash on L arm from tape "used with stitches"    OBJECTIVE: Blood pressure (!) 143/73, pulse 79, temperature 99.2 F (37.3 C), temperature source Oral, resp. rate 18, height 6' (1.829  m), weight 95.6 kg, SpO2 100%.  Physical Exam  Lab Results Lab Results  Component Value Date   WBC 5.2 05/30/2023   HGB 10.6 (L) 05/30/2023   HCT 33.4 (L) 05/30/2023   MCV 83.3 05/30/2023   PLT 155 05/30/2023    Lab Results  Component Value Date   CREATININE 4.15 (H) 05/30/2023   BUN 40 (H) 05/30/2023   NA 140 05/30/2023   K 3.1 (L) 05/30/2023   CL 106 05/30/2023   CO2 22 05/30/2023    Lab Results  Component Value Date   ALT 26 05/26/2023   AST 22 05/26/2023   ALKPHOS 76 05/26/2023   BILITOT 0.8 05/26/2023     Microbiology: Recent Results (from the past 240 hours)  Resp panel by RT-PCR (RSV, Flu A&B, Covid) Anterior Nasal Swab     Status: None   Collection Time: 05/26/23  7:46 PM   Specimen: Anterior Nasal Swab  Result Value Ref Range Status   SARS Coronavirus 2 by RT PCR NEGATIVE NEGATIVE Final    Comment: (NOTE) SARS-CoV-2 target nucleic acids are NOT DETECTED.  The SARS-CoV-2 RNA is generally detectable in upper respiratory specimens during the acute phase of infection. The lowest concentration of SARS-CoV-2 viral copies this assay can  detect is 138 copies/mL. A negative result does not preclude SARS-Cov-2 infection and should not be used as the sole basis for treatment or other patient management decisions. A negative result may occur with  improper specimen collection/handling, submission of specimen other than nasopharyngeal swab, presence of viral mutation(s) within the areas targeted by this assay, and inadequate number of viral copies(<138 copies/mL). A negative result must be combined with clinical observations, patient history, and epidemiological information. The expected result is Negative.  Fact Sheet for Patients:  BloggerCourse.com  Fact Sheet for Healthcare Providers:  SeriousBroker.it  This test is no t yet approved or cleared by the [place]  FDA and  has been authorized for detection and/or diagnosis of SARS-CoV-2 by FDA under an Emergency Use Authorization (EUA). This EUA will remain  in effect (meaning this test can be used) for the duration of the COVID-19 declaration under Section 564(b)(1) of the Act, 21 U.S.C.section 360bbb-3(b)(1), unless the authorization is terminated  or revoked sooner.       Influenza A by PCR NEGATIVE NEGATIVE Final   Influenza B by PCR NEGATIVE NEGATIVE Final    Comment: (NOTE) The Xpert Xpress SARS-CoV-2/FLU/RSV plus assay is intended as an aid in the diagnosis of influenza from Nasopharyngeal swab specimens and should not be used as a sole basis for treatment. Nasal washings and aspirates are unacceptable for Xpert Xpress SARS-CoV-2/FLU/RSV testing.  Fact Sheet for Patients: BloggerCourse.com  Fact Sheet for Healthcare Providers: SeriousBroker.it  This test is not yet approved or cleared by the [place]  FDA and has been authorized for detection and/or diagnosis of SARS-CoV-2 by FDA under an Emergency Use Authorization (EUA). This EUA will remain in  effect (meaning this test can be used) for the duration of the COVID-19 declaration under Section 564(b)(1) of the Act, 21 U.S.C. section 360bbb-3(b)(1), unless the authorization is terminated or revoked.     Resp Syncytial Virus by PCR NEGATIVE NEGATIVE Final    Comment: (NOTE) Fact Sheet for Patients: BloggerCourse.com  Fact Sheet for Healthcare Providers: SeriousBroker.it  This test is not yet approved or cleared by the [place]  FDA and has been authorized for detection and/or diagnosis of SARS-CoV-2 by FDA under an Emergency Use Authorization (EUA). This EUA  will remain in effect (meaning this test can be used) for the duration of the COVID-19 declaration under Section 564(b)(1) of the Act, 21 U.S.C. section 360bbb-3(b)(1), unless the authorization is terminated or revoked.  Performed at [facility], [address] [place], [place] [address]   Urine Culture     Status: Abnormal   Collection Time: 05/26/23  7:46 PM   Specimen: Urine, Random  Result Value Ref Range Status   Specimen Description   Final    URINE, RANDOM Performed at [facility], [address] [place], [place] [address]    Special Requests   Final    NONE Reflexed from [phone] Performed at [facility], [address] [place], [place] [address]    Culture (A)  Final    >=100,000 COLONIES/mL ESCHERICHIA COLI Confirmed Extended Spectrum Beta-Lactamase Producer (ESBL).  In bloodstream infections from ESBL organisms, carbapenems are preferred over piperacillin/tazobactam. They are shown to have a lower risk of mortality.    Report Status 05/29/2023 FINAL  Final   Organism ID, Bacteria ESCHERICHIA COLI (A)  Final      Susceptibility   Escherichia coli - MIC*    AMPICILLIN >=32 RESISTANT Resistant     CEFAZOLIN  >=64 RESISTANT Resistant     CEFEPIME 16 RESISTANT Resistant     CEFTRIAXONE  >=64 RESISTANT Resistant     CIPROFLOXACIN  1 RESISTANT Resistant      GENTAMICIN  >=16 RESISTANT Resistant     IMIPENEM <=0.25 SENSITIVE Sensitive     NITROFURANTOIN 64 INTERMEDIATE Intermediate     TRIMETH/SULFA >=320 RESISTANT Resistant     AMPICILLIN/SULBACTAM >=32 RESISTANT Resistant     PIP/TAZO 64 INTERMEDIATE Intermediate ug/mL    * >=100,000 COLONIES/mL ESCHERICHIA COLI  Blood Culture (routine x 2)     Status: None (Preliminary result)   Collection Time: 05/26/23  8:00 PM   Specimen: BLOOD  Result Value Ref Range Status   Specimen Description BLOOD RIGHT ANTECUBITAL  Final   Special Requests   Final    BOTTLES DRAWN AEROBIC AND ANAEROBIC Blood Culture adequate volume   Culture   Final    NO GROWTH 4 DAYS Performed at [facility], [address] [place], [place] [address]    Report Status PENDING  Incomplete  Blood Culture (routine x 2)     Status: None (Preliminary result)   Collection Time: 05/26/23  8:12 PM   Specimen: BLOOD  Result Value Ref Range Status   Specimen Description BLOOD RIGHT ANTECUBITAL  Final   Special Requests   Final    BOTTLES DRAWN AEROBIC AND ANAEROBIC Blood Culture adequate volume   Culture   Final    NO GROWTH 4 DAYS Performed at [facility], [address], [place], [place] [address]    Report Status PENDING  Incomplete     Marlan Silva, NP [facility]  05/30/2023  2:08 PM

## 2023-05-30 NOTE — Plan of Care (Signed)
  Problem: Coping: Goal: Ability to adjust to condition or change in health will improve Outcome: Progressing   Problem: Health Behavior/Discharge Planning: Goal: Ability to manage health-related needs will improve Outcome: Progressing   Problem: Nutritional: Goal: Maintenance of adequate nutrition will improve Outcome: Progressing Goal: Progress toward achieving an optimal weight will improve Outcome: Progressing

## 2023-05-30 NOTE — Progress Notes (Addendum)
 Subjective:  no cos , tolerated PD  last pm, today had pic line insert for meropenem through 06/05/2023.  Per ID    Objective Vital signs in last 24 hours: Vitals:   05/30/23 0534 05/30/23 0752 05/30/23 0836 05/30/23 0838  BP: 139/79 (!) 142/73 (!) 142/73 (!) 143/73  Pulse: 76 79  79  Resp: 14 18    Temp: 99.2 F (37.3 C) 99.2 F (37.3 C)    TempSrc: Oral Oral    SpO2: 97% 100%    Weight:      Height:       Weight change:   Physical Exam: General: alert ,sitting up in chair  NAD,  Heart: RRR , no  mrg Lungs: CTA  non labored  Abdomen: NABS, soft , ND  Extremities: no pedal edema , R chest pic line  Dialysis Access: PD cath     OP PD  orders: [facility] (Dr. Carrolyn Clan). CCPD Sun, Mon, Wed, Fri nightly. 8 hrs, 4 cycles, 2500cc fill volume, target dwell time ~1 hr . No last fill. Typically uses all 2.5% bags.   Problem/Plan:  Sepsis =   UTI (ESBL E. coli urinary tract) ABX  per ID consult meropenem through 06/05/2023. Via pic  ESRD - CCPD  continue  , fu with  (Dr. Carrolyn Clan). Anemia - hgb 10.6  Secondary hyperparathyroidism - phos  @ goal, C Ca  9.4  on calcitriol   HTN/volume - bp stable on excess vol per exam , contin same meds bp  Charlynne Coombes, PA-C [facility] [phone] 05/30/2023,4:12 PM  LOS: 4 days   Labs: Basic Metabolic Panel: Recent Labs  Lab 05/28/23 0530 05/29/23 1142 05/30/23 0501  NA 141 137 140  K 3.0* 3.4* 3.1*  CL 108 103 106  CO2 26 23 22   GLUCOSE 154* 130* 184*  BUN 40* 46* 40*  CREATININE 4.05* 4.31* 4.15*  CALCIUM 8.5* 8.3* 8.5*  PHOS 3.1 3.0 3.1   Liver Function Tests: Recent Labs  Lab 05/26/23 2017 05/28/23 0530 05/29/23 1142 05/30/23 0501  AST 22  --   --   --   ALT 26  --   --   --   ALKPHOS 76  --   --   --   BILITOT 0.8  --   --   --   PROT 7.0  --   --   --   ALBUMIN 3.6 2.6* 2.4* 2.4*   No results for input(s): "LIPASE", "AMYLASE" in the last 168 hours. No results for input(s): "AMMONIA" in  the last 168 hours. CBC: Recent Labs  Lab 05/26/23 2017 05/27/23 0511 05/28/23 0530 05/30/23 0501  WBC 10.8* 15.1* 12.4* 5.2  NEUTROABS 8.6*  --   --   --   HGB 12.5* 12.1* 11.3* 10.6*  HCT 39.4 39.5 34.7* 33.4*  MCV 83.8 87.2 83.2 83.3  PLT 165 153 146* 155   Cardiac Enzymes: No results for input(s): "CKTOTAL", "CKMB", "CKMBINDEX", "TROPONINI" in the last 168 hours. CBG: Recent Labs  Lab 05/29/23 1630 05/29/23 2044 05/30/23 0553 05/30/23 0838 05/30/23 1121  GLUCAP 125* 183* 163* 188* 145*    Studies/Results: No results found. Medications:  dialysis solution 2.5% low-MG/low-CA     meropenem (MERREM) IV 500 mg (05/30/23 1038)    aspirin  EC  81 mg Oral q morning   calcitRIOL   0.5 mcg Oral BID   gabapentin   100 mg Oral Daily   And   gabapentin   200 mg Oral QHS  gentamicin  cream  1 Application Topical Daily   heparin   5,000 Units Subcutaneous Q8H   hydrALAZINE   100 mg Oral TID   insulin  aspart  0-6 Units Subcutaneous TID WC   insulin  aspart  3 Units Subcutaneous TID WC   insulin  glargine-yfgn  15 Units Subcutaneous QHS   metoprolol  tartrate  100 mg Oral Daily   And   metoprolol  tartrate  75 mg Oral QHS   pantoprazole   40 mg Oral Daily   sodium chloride  flush  3 mL Intravenous Q12H   tamsulosin   0.4 mg Oral QHS

## 2023-05-30 NOTE — Progress Notes (Signed)
 Mobility Specialist Progress Note:   05/30/23 1000  Mobility  Activity Ambulated independently in hallway  Level of Assistance Standby assist, set-up cues, supervision of patient - no hands on  Assistive Device None  Distance Ambulated (ft) 90 ft  Activity Response Tolerated well  Mobility Referral Yes  Mobility visit 1 Mobility  Mobility Specialist Start Time (ACUTE ONLY) 0913  Mobility Specialist Stop Time (ACUTE ONLY) [id number]  Mobility Specialist Time Calculation (min) (ACUTE ONLY) 5 min   Pt received in bed and agreeable. C/o fatigue, otherwise asymptomatic. No complaints throughout ambulation. Pt left in bed with call bell and all needs met.  D'Vante Nolon Baxter Mobility Specialist Please contact via Special educational needs teacher or Rehab office at [phone]

## 2023-05-30 NOTE — Progress Notes (Addendum)
 PHARMACY CONSULT NOTE FOR:  OUTPATIENT  PARENTERAL ANTIBIOTIC THERAPY (OPAT)  Indication: ESBL E.coli UTI Regimen: Meropenem 500 mg IV every 24 hours End date: 06/05/23  IV antibiotic discharge orders are pended. To discharging provider:  please sign these orders via discharge navigator,  Select New Orders & click on the button choice - Manage This Unsigned Work.   Thank you for allowing pharmacy to be a part of this patient's care.  Garland Junk, PharmD, BCPS, BCIDP Infectious Diseases Clinical Pharmacist 05/30/2023 10:03 AM   **Pharmacist phone directory can now be found on [email or web address] ([email or web address]).  Listed under [facility].

## 2023-05-30 NOTE — Progress Notes (Signed)
 PD post treatment note  PD treatment completed. Patient tolerated treatment well. PD effluent is clear. No specimen collected. PD exit site clean, dry and intact. Patient is awake, oriented and in no acute distress.  Report given to bedside nurse.   Post treatment VS:   Total UF removed:  1200 ml   Post treatment weight: 97.7 kg    05/30/23 0611  Peritoneal Catheter Mid lower abdomen  No placement date or time found.   Catheter Location: Mid lower abdomen  Site Assessment Clean, Dry, Intact  Drainage Description None  Catheter status Deaccessed  Dressing Gauze/Drain sponge  Dressing Status Clean, Dry, Intact  Dressing Intervention Dressing reinforced  Completion  Treatment Status Complete  Initial Drain Volume 368  Average Dwell Time-Hour(s) 1  Average Dwell Time-Min(s) 30  Average Drain Time 20  Total Therapy Volume 30865  Total Therapy Time-Hour(s) 8  Total Therapy Time-Min(s) 50  Weight after Drain 215 lb 6.2 oz (97.7 kg)  Effluent Appearance Clear  Cell Count on Daytime Exchange N/A  Procedure Comments  Tolerated treatment well? Yes  Peritoneal Dialysis Comments tx achieved as expected, tolerated well. no complaints.  Education / Care Plan  Dialysis Education Provided Yes  Outpatient Plan of Care Reviewed and on Chart Yes  Hand-off documentation  Hand-off Given Given to shift RN/LPN  Report given to (Full Name) Charls Cooks, RN  Hand-off Received Received from shift RN/LPN  Report received from (Full Name) Laconya Clere, RN

## 2023-05-30 NOTE — Discharge Summary (Addendum)
 Physician Discharge Summary   Patient: Carlos Gill MRN: [id number] DOB: 1940-12-07  Admit date:     05/26/2023  Discharge date: 05/30/23  Discharge Physician: Bobbetta Burnet   PCP: Suan Elm, MD   Recommendations at discharge:   Follow with the PCP in 1 week Follow-up with infectious disease Dr. Levern Reader  in 1-2 weeks Labs CBC CMP ESR CRP q. weekly result to PCP and ID Continue IV antibiotic meropenem infusion daily end date of 06/05/2023 (For treatment of ESBL E. coli complicated UTI) 5.  Continue home peritoneal dialysis, follow-up with nephrologist closely 6.  Please follow-up with interventional radiologist on 06/05/2023 for removal of Rt IJ tunneled catheter   Discharge Diagnoses: Principal Problem:   Sepsis secondary to UTI [name]) Active Problems:   Essential hypertension   Benign prostatic hyperplasia with urinary obstruction   Uncontrolled type 2 diabetes mellitus with hyperglycemia, with long-term current use of insulin  (HCC)   Chronic kidney disease, stage V (HCC)   OSA on CPAP   UTI (urinary tract infection)   Severe sepsis (HCC)  Resolved Problems:   * No resolved hospital problems. *  Hospital Course: 83 y.o. male with medical history significant for hypertension, hyperlipidemia, OSA on CPAP, insulin -dependent diabetes mellitus, and ESRD on peritoneal dialysis who presents with loss of appetite, fatigue, and general malaise.   Patient complains of feeling generally poor with fatigue, loss of appetite, and chills for few days.  He denies cough, shortness of breath, chest pain, abdominal pain, flank pain, or dysuria but does note suprapubic discomfort.  There has not been any headache or neck stiffness.   ED Course: Upon arrival to the ED, patient is found to be febrile to 39.2 C and saturating well on room air with mild tachypnea, normal HR, and stable BP.  Labs are most notable for potassium 3.4, BUN 53, WBC 10,800, and lactic acid 2.0.  Urine notable for  bacteriuria, pyuria, and nitrites.   Blood and urine cultures were collected and the patient was treated with acetaminophen , Rocephin , and Flagyl .  Nephrology (Dr. Yvonnie Heritage) was consulted by the ED physician and recommended admission to [facility] for peritoneal dialysis.  Severe sepsis due to GNR UTI:  - Sepsis physiology resolved, currently hemodynamically stable   - Present on arrival.  Febrile with tachypnea, leukocytosis and lactic acidosis.  UA consistent with UTI.   Urine culture with multiresistant ESBL  E. coli -  Blood cultures NGTD.    -Changed IV Rocephin  to meropenem --Urine culture positive for E. coli,-SBL multiple drug-resistant  - Gust with ID Dr. Levern Reader, nephrology and interventional radiologist-all decisions were made to place a tunneled catheter placement, for continuous IV antibiotics of Meropenem-for total of 7 days of treatment 06/05/2023  05/30/2023, s/p tunneled catheter RIJ CVC placement   ESRD on PD: No signs of peritonitis on exam. -Peritoneal dialysis per nephrology.  - Nephrology following   IDDM-2 with level 2 hypoglycemia and hyperglycemia: A1c 7.9% in 10/2022 - Carb modified strict diabetic diet -Resume home regimen-medication needs to be adjusted for tighter glycemic control.   Hypertension: Normotensive-stable -Continue hydralazine  and metoprolol  as tolerated    OSA not compliant with CPAP -Encourage CPAP   BPH with chronic urgency - Continue Flomax    Hypokalemia - P.o. KCl 60 mill equivalent x 1   Body mass index is 28.58 kg/m.        Code Status: Full code      Final disposition: Home Home health, IV antibiotic infusion  Consultants: Curbside with consult ID, consulted nephrology, IR Procedures performed: Tunneled catheter placement Disposition: Home health Diet recommendation:  Cardiac diet DISCHARGE MEDICATION: Allergies as of 05/30/2023       Reactions   Statins Swelling, Other (See Comments)   Muscle aches   Tape  Rash   Pt has rash on L arm from tape "used with stitches"    Wound Dressing Adhesive Dermatitis   Pt has rash on L arm from tape "used with stitches"        Medication List     STOP taking these medications    fluticasone 50 MCG/ACT nasal spray Commonly known as: FLONASE   Theratears PF 0.25 % Soln Generic drug: Carboxymethylcellulose Sod PF       TAKE these medications    acetaminophen  500 MG tablet Commonly known as: TYLENOL  Take 1,000 mg by mouth daily as needed for moderate pain.   aspirin  EC 81 MG tablet Take 81 mg by mouth every morning.   calcitRIOL  0.5 MCG capsule Commonly known as: ROCALTROL  Take 0.5 mcg by mouth 2 (two) times daily.   ezetimibe  10 MG tablet Commonly known as: ZETIA  Take 10 mg by mouth daily.   FreeStyle Libre 2 Sensor Misc as directed topically change every 14 days to monitor blood glucose continuously   furosemide  40 MG tablet Commonly known as: LASIX  Take 40 mg by mouth 2 (two) times daily.   gabapentin  100 MG capsule Commonly known as: NEURONTIN  Take 100-200 mg by mouth See admin instructions. Take 100 mg in the morning and 200 mg at bedtime   hydrALAZINE  100 MG tablet Commonly known as: APRESOLINE  Take 100 mg by mouth 3 (three) times daily.   insulin  aspart 100 UNIT/ML injection Commonly known as: novoLOG  Inject 6-18 Units into the skin 3 (three) times daily with meals. Per sliding scale   insulin  glargine 100 unit/mL Sopn Commonly known as: LANTUS  Inject 40 Units into the skin 2 (two) times daily.   lactobacillus acidophilus & bulgar chewable tablet Chew 1 tablet by mouth 3 (three) times daily with meals for 7 days.   meropenem IVPB Commonly known as: MERREM Inject 500 mg into the vein daily for 6 days. Indication:  ESBL E.coli UTI First Dose: Yes Last Day of Therapy:  06/05/23 Labs - Once weekly:  CBC/D and BMP, Labs - Once weekly: ESR and CRP Method of administration: Mini-Bag Plus / Gravity Method of  administration may be changed at the discretion of home infusion pharmacist based upon assessment of the patient and/or caregiver's ability to self-administer the medication ordered.   metoprolol  tartrate 50 MG tablet Commonly known as: LOPRESSOR  Take 75-100 mg by mouth See admin instructions. Take 100 mg in the morning and 75 mg at bedtime   omeprazole 20 MG capsule Commonly known as: PRILOSEC Take 20 mg by mouth daily.   PreserVision AREDS 2 Caps Take 1 capsule by mouth 2 (two) times daily.   tamsulosin  0.4 MG Caps capsule Commonly known as: FLOMAX  Take 0.4 mg by mouth at bedtime.               Discharge Care Instructions  (From admission, onward)           Start     Ordered   05/30/23 0000  Change dressing on IV access line weekly and PRN  (Home infusion instructions - [facility] )        05/30/23 1114   05/30/23 0000  Discharge wound care:  Comments: Comments: Clean skin near exit site with chloraprep swab sticks.  Starting at catheter, use circular pattern around exit site, moving towards outer edges of area covered by dressing.  Apply gentamicin  cream to site once daily.  Cover with dry dressing.  05/28/23 1153     05/27/23 1135    Exit site care:  Once      Comments: Clean skin near exit site with chloraprep swab sticks.  Starting at catheter, use circular pattern around exit site, moving towards outer edges of area covered by dressing.  Apply gentamicin  cream to site once daily.  Cover with dry dressing.   05/30/23 1444            Discharge Exam: Filed Weights   05/26/23 1905 05/28/23 1907 05/29/23 0523  Weight: 100 kg 99.3 kg 95.6 kg        General:  AAO x 3,  cooperative, no distress;   HEENT:  Normocephalic, PERRL, otherwise with in Normal limits   Neuro:  CNII-XII intact. , normal motor and sensation, reflexes intact   Lungs:   Clear to auscultation BL, Respirations unlabored,  No wheezes / crackles  Cardio:    S1/S2, RRR, No  murmure, No Rubs or Gallops   Abdomen:  Soft, non-tender, bowel sounds active all four quadrants, no guarding or peritoneal signs.  Muscular  skeletal:  Limited exam -global generalized weaknesses - in bed, able to move all 4 extremities,   2+ pulses,  symmetric, No pitting edema  Skin:  Dry, warm to touch, negative for any Rashes,  Wounds: Please see nursing documentation          Condition at discharge: good  The results of significant diagnostics from this hospitalization (including imaging, microbiology, ancillary and laboratory) are listed below for reference.   Imaging Studies: DG Chest Port 1 View Result Date: 05/26/2023 CLINICAL DATA:  Weakness EXAM: PORTABLE CHEST 1 VIEW COMPARISON:  10/17/2022 FINDINGS: Mild atelectasis or infiltrate at left base. Stable cardiomediastinal silhouette. No pneumothorax IMPRESSION: Mild atelectasis or infiltrate at left base. Electronically Signed   By: Esmeralda Hedge M.D.   On: 05/26/2023 21:30    Microbiology: Results for orders placed or performed during the hospital encounter of 05/26/23  Resp panel by RT-PCR (RSV, Flu A&B, Covid) Anterior Nasal Swab     Status: None   Collection Time: 05/26/23  7:46 PM   Specimen: Anterior Nasal Swab  Result Value Ref Range Status   SARS Coronavirus 2 by RT PCR NEGATIVE NEGATIVE Final    Comment: (NOTE) SARS-CoV-2 target nucleic acids are NOT DETECTED.  The SARS-CoV-2 RNA is generally detectable in upper respiratory specimens during the acute phase of infection. The lowest concentration of SARS-CoV-2 viral copies this assay can detect is 138 copies/mL. A negative result does not preclude SARS-Cov-2 infection and should not be used as the sole basis for treatment or other patient management decisions. A negative result may occur with  improper specimen collection/handling, submission of specimen other than nasopharyngeal swab, presence of viral mutation(s) within the areas targeted by this assay,  and inadequate number of viral copies(<138 copies/mL). A negative result must be combined with clinical observations, patient history, and epidemiological information. The expected result is Negative.  Fact Sheet for Patients:  BloggerCourse.com  Fact Sheet for Healthcare Providers:  SeriousBroker.it  This test is no t yet approved or cleared by the [place]  FDA and  has been authorized for detection and/or diagnosis of SARS-CoV-2 by FDA under an Emergency Use  Authorization (EUA). This EUA will remain  in effect (meaning this test can be used) for the duration of the COVID-19 declaration under Section 564(b)(1) of the Act, 21 U.S.C.section 360bbb-3(b)(1), unless the authorization is terminated  or revoked sooner.       Influenza A by PCR NEGATIVE NEGATIVE Final   Influenza B by PCR NEGATIVE NEGATIVE Final    Comment: (NOTE) The Xpert Xpress SARS-CoV-2/FLU/RSV plus assay is intended as an aid in the diagnosis of influenza from Nasopharyngeal swab specimens and should not be used as a sole basis for treatment. Nasal washings and aspirates are unacceptable for Xpert Xpress SARS-CoV-2/FLU/RSV testing.  Fact Sheet for Patients: BloggerCourse.com  Fact Sheet for Healthcare Providers: SeriousBroker.it  This test is not yet approved or cleared by the [place]  FDA and has been authorized for detection and/or diagnosis of SARS-CoV-2 by FDA under an Emergency Use Authorization (EUA). This EUA will remain in effect (meaning this test can be used) for the duration of the COVID-19 declaration under Section 564(b)(1) of the Act, 21 U.S.C. section 360bbb-3(b)(1), unless the authorization is terminated or revoked.     Resp Syncytial Virus by PCR NEGATIVE NEGATIVE Final    Comment: (NOTE) Fact Sheet for Patients: BloggerCourse.com  Fact Sheet for  Healthcare Providers: SeriousBroker.it  This test is not yet approved or cleared by the [place]  FDA and has been authorized for detection and/or diagnosis of SARS-CoV-2 by FDA under an Emergency Use Authorization (EUA). This EUA will remain in effect (meaning this test can be used) for the duration of the COVID-19 declaration under Section 564(b)(1) of the Act, 21 U.S.C. section 360bbb-3(b)(1), unless the authorization is terminated or revoked.  Performed at [facility], [address] [place], [place] [address]   Urine Culture     Status: Abnormal   Collection Time: 05/26/23  7:46 PM   Specimen: Urine, Random  Result Value Ref Range Status   Specimen Description   Final    URINE, RANDOM Performed at [facility], [address], [place], [place] [address]    Special Requests   Final    NONE Reflexed from [id number] Performed at [facility], [address] [place], [place] [address]    Culture (A)  Final    >=100,000 COLONIES/mL ESCHERICHIA COLI Confirmed Extended Spectrum Beta-Lactamase Producer (ESBL).  In bloodstream infections from ESBL organisms, carbapenems are preferred over piperacillin/tazobactam. They are shown to have a lower risk of mortality.    Report Status 05/29/2023 FINAL  Final   Organism ID, Bacteria ESCHERICHIA COLI (A)  Final      Susceptibility   Escherichia coli - MIC*    AMPICILLIN >=32 RESISTANT Resistant     CEFAZOLIN  >=64 RESISTANT Resistant     CEFEPIME 16 RESISTANT Resistant     CEFTRIAXONE  >=64 RESISTANT Resistant     CIPROFLOXACIN  1 RESISTANT Resistant     GENTAMICIN  >=16 RESISTANT Resistant     IMIPENEM <=0.25 SENSITIVE Sensitive     NITROFURANTOIN 64 INTERMEDIATE Intermediate     TRIMETH/SULFA >=320 RESISTANT Resistant     AMPICILLIN/SULBACTAM >=32 RESISTANT Resistant     PIP/TAZO 64 INTERMEDIATE Intermediate ug/mL    * >=100,000 COLONIES/mL ESCHERICHIA COLI  Blood Culture (routine x 2)     Status: None  (Preliminary result)   Collection Time: 05/26/23  8:00 PM   Specimen: BLOOD  Result Value Ref Range Status   Specimen Description BLOOD RIGHT ANTECUBITAL  Final   Special Requests   Final    BOTTLES  DRAWN AEROBIC AND ANAEROBIC Blood Culture adequate volume   Culture   Final    NO GROWTH 4 DAYS Performed at [facility], [address], [place], [place] [address]    Report Status PENDING  Incomplete  Blood Culture (routine x 2)     Status: None (Preliminary result)   Collection Time: 05/26/23  8:12 PM   Specimen: BLOOD  Result Value Ref Range Status   Specimen Description BLOOD RIGHT ANTECUBITAL  Final   Special Requests   Final    BOTTLES DRAWN AEROBIC AND ANAEROBIC Blood Culture adequate volume   Culture   Final    NO GROWTH 4 DAYS Performed at [facility], [address] [place], [place] [address]    Report Status PENDING  Incomplete    Labs: CBC: Recent Labs  Lab 05/26/23 2017 05/27/23 0511 05/28/23 0530 05/30/23 0501  WBC 10.8* 15.1* 12.4* 5.2  NEUTROABS 8.6*  --   --   --   HGB 12.5* 12.1* 11.3* 10.6*  HCT 39.4 39.5 34.7* 33.4*  MCV 83.8 87.2 83.2 83.3  PLT 165 153 146* 155   Basic Metabolic Panel: Recent Labs  Lab 05/26/23 2017 05/27/23 0511 05/28/23 0530 05/29/23 1142 05/30/23 0501  NA 139 140 141 137 140  K 3.4* 3.3* 3.0* 3.4* 3.1*  CL 105 107 108 103 106  CO2 21* 21* 26 23 22   GLUCOSE 146* 44* 154* 130* 184*  BUN 53* 48* 40* 46* 40*  CREATININE 3.90* 3.64* 4.05* 4.31* 4.15*  CALCIUM 9.0 8.5* 8.5* 8.3* 8.5*  MG  --  1.9  --   --  1.9  PHOS  --  3.1 3.1 3.0 3.1   Liver Function Tests: Recent Labs  Lab 05/26/23 2017 05/28/23 0530 05/29/23 1142 05/30/23 0501  AST 22  --   --   --   ALT 26  --   --   --   ALKPHOS 76  --   --   --   BILITOT 0.8  --   --   --   PROT 7.0  --   --   --   ALBUMIN 3.6 2.6* 2.4* 2.4*   CBG: Recent Labs  Lab 05/29/23 1630 05/29/23 2044 05/30/23 0553 05/30/23 0838 05/30/23 1121  GLUCAP 125* 183* 163* 188*  145*    Discharge time spent: greater than 30 minutes.  Signed: Bobbetta Burnet, MD [facility] 05/30/2023

## 2023-05-30 NOTE — TOC Transition Note (Addendum)
 Transition of Care [facility] - Discharge Note   Patient Details  Name: Carlos Gill MRN: [id number] Date of Birth: 18-Sep-1940  [facility]) CM/SW Contact:  Tom-Johnson, Ariyanna Oien Daphne, RN Phone Number: 05/30/2023, 3:22 PM   Clinical Narrative:     Patient is scheduled for discharge today.  Readmission Risk Assessment done. Outpatient referral, hospital f/u and discharge instructions on AVS. No PT f/u, Patient discharging home with IV abx. [facility] referred to for home IV abx and RN.  Awaiting Insurance auth for home IV and eduction with grandson Jimani at bedside by Pam with [employer].  Grandson, Jimani to transport at discharge.  No further TOC needs noted.        Final next level of care: Home/Self Care Barriers to Discharge: Barriers Resolved   Patient Goals and CMS Choice Patient states their goals for this hospitalization and ongoing recovery are:: To return home CMS Medicare.gov Compare Post Acute Care list provided to:: Patient Choice offered to / list presented to : NA      Discharge Placement                Patient to be transferred to facility by: Grandson. Name of family member notified: Joelyn Music    Discharge Plan and Services Additional resources added to the After Visit Summary for   In-house Referral: Clinical Social Work              DME Arranged: N/A DME Agency: NA       HH Arranged: NA HH Agency: NA        Social Drivers of Health (SDOH) Interventions SDOH Screenings   Food Insecurity: No Food Insecurity (05/27/2023)  Housing: Low Risk  (05/27/2023)  Transportation Needs: No Transportation Needs (05/27/2023)  Utilities: Not At Risk (05/27/2023)  Financial Resource Strain: Low Risk  (03/07/2023)   [facility]  Social Connections: Unknown (05/27/2023)  Stress: No Stress Concern Present (11/10/2021)   Received from [facility]  Tobacco Use: Medium Risk (05/26/2023)     Readmission Risk  Interventions    05/30/2023    3:21 PM 05/27/2023   10:26 AM  Readmission Risk Prevention Plan  Transportation Screening Complete Complete  PCP or Specialist Appt within 3-5 Days Complete   HRI or Home Care Consult Complete Complete  Social Work Consult for Recovery Care Planning/Counseling Complete   Palliative Care Screening Not Applicable Not Applicable  Medication Review Oceanographer) Referral to Pharmacy Complete

## 2023-05-30 NOTE — TOC Progression Note (Signed)
 Transition of Care [facility]) - Progression Note    Patient Details  Name: Carlos Gill MRN: [id number] Date of Birth: September 16, 1940  [facility] CM/SW Contact  Tom-Johnson, Angelique Ken, RN Phone Number: 05/30/2023, 4:42 PM  Clinical Narrative:     CM informed by Oralee Billow with [employer] that the [facility] has not yet authorized patient's home IV abx. Patient cannot be discharged as the [place] will not authorize abx after patient is discharged home. MD, RN notified.   CM will continue to follow.   Expected Discharge Plan: Home w Home Health Services Barriers to Discharge: Barriers Resolved  Expected Discharge Plan and Services In-house Referral: Clinical Social Work     Living arrangements for the past 2 months: Single Family Home Expected Discharge Date: 05/30/23               DME Arranged: N/A DME Agency: NA       HH Arranged: NA HH Agency: NA         Social Determinants of Health (SDOH) Interventions SDOH Screenings   Food Insecurity: No Food Insecurity (05/27/2023)  Housing: Low Risk  (05/27/2023)  Transportation Needs: No Transportation Needs (05/27/2023)  Utilities: Not At Risk (05/27/2023)  Financial Resource Strain: Low Risk  (03/07/2023)   Received from [facility]  Social Connections: Unknown (05/27/2023)  Stress: No Stress Concern Present (11/10/2021)   Received from [facility]  Tobacco Use: Medium Risk (05/26/2023)    Readmission Risk Interventions    05/30/2023    3:21 PM 05/27/2023   10:26 AM  Readmission Risk Prevention Plan  Transportation Screening Complete Complete  PCP or Specialist Appt within 3-5 Days Complete   HRI or Home Care Consult Complete Complete  Social Work Consult for Recovery Care Planning/Counseling Complete   Palliative Care Screening Not Applicable Not Applicable  Medication Review Oceanographer) Referral to Pharmacy Complete

## 2023-05-31 ENCOUNTER — Encounter (HOSPITAL_COMMUNITY): Payer: Self-pay | Admitting: Family Medicine

## 2023-05-31 DIAGNOSIS — N390 Urinary tract infection, site not specified: Secondary | ICD-10-CM | POA: Diagnosis not present

## 2023-05-31 DIAGNOSIS — A419 Sepsis, unspecified organism: Secondary | ICD-10-CM | POA: Diagnosis not present

## 2023-05-31 LAB — RENAL FUNCTION PANEL
Albumin: 2.5 g/dL — ABNORMAL LOW (ref 3.5–5.0)
Anion gap: 12 (ref 5–15)
BUN: 43 mg/dL — ABNORMAL HIGH (ref 8–23)
CO2: 21 mmol/L — ABNORMAL LOW (ref 22–32)
Calcium: 8.6 mg/dL — ABNORMAL LOW (ref 8.9–10.3)
Chloride: 105 mmol/L (ref 98–111)
Creatinine, Ser: 4.18 mg/dL — ABNORMAL HIGH (ref 0.61–1.24)
GFR, Estimated: 13 mL/min — ABNORMAL LOW (ref >60)
Glucose, Bld: 134 mg/dL — ABNORMAL HIGH (ref 70–99)
Phosphorus: 3 mg/dL (ref 2.5–4.6)
Potassium: 3.5 mmol/L (ref 3.5–5.1)
Sodium: 138 mmol/L (ref 135–145)

## 2023-05-31 LAB — CBC
HCT: 33.4 % — ABNORMAL LOW (ref 39.0–52.0)
Hemoglobin: 10.8 g/dL — ABNORMAL LOW (ref 13.0–17.0)
MCH: 26.8 pg (ref 26.0–34.0)
MCHC: 32.3 g/dL (ref 30.0–36.0)
MCV: 82.9 fL (ref 80.0–100.0)
Platelets: UNDETERMINED 10*3/uL (ref 150–400)
RBC: 4.03 MIL/uL — ABNORMAL LOW (ref 4.22–5.81)
RDW: 15 % (ref 11.5–15.5)
WBC: 4.4 10*3/uL (ref 4.0–10.5)
nRBC: 0 % (ref 0.0–0.2)

## 2023-05-31 LAB — GLUCOSE, CAPILLARY
Glucose-Capillary: 129 mg/dL — ABNORMAL HIGH (ref 70–99)
Glucose-Capillary: 136 mg/dL — ABNORMAL HIGH (ref 70–99)

## 2023-05-31 LAB — CULTURE, BLOOD (ROUTINE X 2)
Culture: NO GROWTH
Culture: NO GROWTH
Special Requests: ADEQUATE
Special Requests: ADEQUATE

## 2023-05-31 LAB — MAGNESIUM: Magnesium: 1.9 mg/dL (ref 1.7–2.4)

## 2023-05-31 NOTE — Progress Notes (Signed)
 Physician Discharge Summary   Patient: Carlos Gill MRN: [id number] DOB: March 05, 1940  Admit date:     05/26/2023  Discharge date: 05/31/23  Discharge Physician: Bobbetta Burnet   PCP: Suan Elm, MD   The patient was seen and examined this morning, stable no acute distress.  S/p successful tunneled catheter placement for IV antibiotic use.  Tolerated procedure well.   Cleared to be discharged.  Pending approval from [facility] for IV antibiotics at home     Recommendations at discharge:   Follow with the PCP in 1 week Follow-up with infectious disease Dr. Levern Reader  in 1-2 weeks Labs CBC CMP ESR CRP q. weekly result to PCP and ID Continue IV antibiotic meropenem infusion daily end date of 06/05/2023 (For treatment of ESBL E. coli complicated UTI) 5.  Continue home peritoneal dialysis, follow-up with nephrologist closely 6.  Please follow-up with interventional radiologist on 06/05/2023 for removal of Rt IJ tunneled catheter   Discharge Diagnoses: Principal Problem:   Sepsis secondary to UTI [facility]) Active Problems:   Essential hypertension   Benign prostatic hyperplasia with urinary obstruction   Uncontrolled type 2 diabetes mellitus with hyperglycemia, with long-term current use of insulin  (HCC)   Chronic kidney disease, stage V (HCC)   OSA on CPAP   UTI (urinary tract infection)   Severe sepsis (HCC)  Resolved Problems:   * No resolved hospital problems. *  Hospital Course: 83 y.o. male with medical history significant for hypertension, hyperlipidemia, OSA on CPAP, insulin -dependent diabetes mellitus, and ESRD on peritoneal dialysis who presents with loss of appetite, fatigue, and general malaise.   Patient complains of feeling generally poor with fatigue, loss of appetite, and chills for few days.  He denies cough, shortness of breath, chest pain, abdominal pain, flank pain, or dysuria but does note suprapubic discomfort.  There has not been any headache or neck stiffness.   ED  Course: Upon arrival to the ED, patient is found to be febrile to 39.2 C and saturating well on room air with mild tachypnea, normal HR, and stable BP.  Labs are most notable for potassium 3.4, BUN 53, WBC 10,800, and lactic acid 2.0.  Urine notable for bacteriuria, pyuria, and nitrites.   Blood and urine cultures were collected and the patient was treated with acetaminophen , Rocephin , and Flagyl .  Nephrology (Dr. Yvonnie Heritage) was consulted by the ED physician and recommended admission to [facility] for peritoneal dialysis.  Severe sepsis due to GNR UTI:  - Sepsis physiology resolved, currently hemodynamically stable   - Present on arrival.  Febrile with tachypnea, leukocytosis and lactic acidosis.  UA consistent with UTI.   Urine culture with multiresistant ESBL  E. coli -  Blood cultures NGTD.    -Changed IV Rocephin  to meropenem --Urine culture positive for E. coli,-SBL multiple drug-resistant  - Gust with ID Dr. Levern Reader, nephrology and interventional radiologist-all decisions were made to place a tunneled catheter placement, for continuous IV antibiotics of Meropenem-for total of 7 days of treatment 06/05/2023  05/30/2023, s/p tunneled catheter RIJ CVC placement   ESRD on PD: No signs of peritonitis on exam. -Peritoneal dialysis per nephrology.  - Nephrology following   IDDM-2 with level 2 hypoglycemia and hyperglycemia: A1c 7.9% in 10/2022 - Carb modified strict diabetic diet -Resume home regimen-medication needs to be adjusted for tighter glycemic control.   Hypertension: Normotensive-stable -Continue hydralazine  and metoprolol  as tolerated    OSA not compliant with CPAP -Encourage CPAP   BPH with chronic urgency -  Continue Flomax    Hypokalemia - P.o. KCl 60 mill equivalent x 1   Body mass index is 28.58 kg/m.        Code Status: Full code      Final disposition: Home Home health, IV antibiotic infusion    Consultants: Curbside with consult ID, consulted  nephrology, IR Procedures performed: Tunneled catheter placement Disposition: Home health Diet recommendation:  Cardiac diet DISCHARGE MEDICATION: Allergies as of 05/31/2023       Reactions   Statins Swelling, Other (See Comments)   Muscle aches   Tape Rash   Pt has rash on L arm from tape "used with stitches"    Wound Dressing Adhesive Dermatitis   Pt has rash on L arm from tape "used with stitches"        Medication List     STOP taking these medications    fluticasone 50 MCG/ACT nasal spray Commonly known as: FLONASE   Theratears PF 0.25 % Soln Generic drug: Carboxymethylcellulose Sod PF       TAKE these medications    acetaminophen  500 MG tablet Commonly known as: TYLENOL  Take 1,000 mg by mouth daily as needed for moderate pain.   aspirin  EC 81 MG tablet Take 81 mg by mouth every morning.   calcitRIOL  0.5 MCG capsule Commonly known as: ROCALTROL  Take 0.5 mcg by mouth 2 (two) times daily.   ezetimibe  10 MG tablet Commonly known as: ZETIA  Take 10 mg by mouth daily.   FreeStyle Libre 2 Sensor Misc as directed topically change every 14 days to monitor blood glucose continuously   furosemide  40 MG tablet Commonly known as: LASIX  Take 40 mg by mouth 2 (two) times daily.   gabapentin  100 MG capsule Commonly known as: NEURONTIN  Take 100-200 mg by mouth See admin instructions. Take 100 mg in the morning and 200 mg at bedtime   hydrALAZINE  100 MG tablet Commonly known as: APRESOLINE  Take 100 mg by mouth 3 (three) times daily.   insulin  aspart 100 UNIT/ML injection Commonly known as: novoLOG  Inject 6-18 Units into the skin 3 (three) times daily with meals. Per sliding scale   insulin  glargine 100 unit/mL Sopn Commonly known as: LANTUS  Inject 40 Units into the skin 2 (two) times daily.   lactobacillus acidophilus & bulgar chewable tablet Chew 1 tablet by mouth 3 (three) times daily with meals for 7 days.   meropenem IVPB Commonly known as:  MERREM Inject 500 mg into the vein daily for 6 days. Indication:  ESBL E.coli UTI First Dose: Yes Last Day of Therapy:  06/05/23 Labs - Once weekly:  CBC/D and BMP, Labs - Once weekly: ESR and CRP Method of administration: Mini-Bag Plus / Gravity Method of administration may be changed at the discretion of home infusion pharmacist based upon assessment of the patient and/or caregiver's ability to self-administer the medication ordered.   metoprolol  tartrate 50 MG tablet Commonly known as: LOPRESSOR  Take 75-100 mg by mouth See admin instructions. Take 100 mg in the morning and 75 mg at bedtime   omeprazole 20 MG capsule Commonly known as: PRILOSEC Take 20 mg by mouth daily.   PreserVision AREDS 2 Caps Take 1 capsule by mouth 2 (two) times daily.   tamsulosin  0.4 MG Caps capsule Commonly known as: FLOMAX  Take 0.4 mg by mouth at bedtime.               Discharge Care Instructions  (From admission, onward)           Start  Ordered   05/30/23 0000  Change dressing on IV access line weekly and PRN  (Home infusion instructions - [facility] )        05/30/23 1114   05/30/23 0000  Discharge wound care:       Comments: Comments: Clean skin near exit site with chloraprep swab sticks.  Starting at catheter, use circular pattern around exit site, moving towards outer edges of area covered by dressing.  Apply gentamicin  cream to site once daily.  Cover with dry dressing.  05/28/23 1153     05/27/23 1135    Exit site care:  Once      Comments: Clean skin near exit site with chloraprep swab sticks.  Starting at catheter, use circular pattern around exit site, moving towards outer edges of area covered by dressing.  Apply gentamicin  cream to site once daily.  Cover with dry dressing.   05/30/23 1444            Follow-up Information     [facility] Follow up.   Why: Someone will call you to schedule home visit for IV abx. Contact  information: [address] [place], [place] [address]               Discharge Exam: Cleavon Curls Weights   05/26/23 1905 05/28/23 1907 05/29/23 0523  Weight: 100 kg 99.3 kg 95.6 kg          General:  AAO x 3,  cooperative, no distress;   HEENT:  Normocephalic, PERRL, otherwise with in Normal limits   Neuro:  CNII-XII intact. , normal motor and sensation, reflexes intact   Lungs:   Clear to auscultation BL, Respirations unlabored,  No wheezes / crackles  Cardio:    S1/S2, RRR, No murmure, No Rubs or Gallops   Abdomen:  Soft, non-tender, bowel sounds active all four quadrants, no guarding or peritoneal signs.  Muscular  skeletal:  Limited exam -global generalized weaknesses - in bed, able to move all 4 extremities,   2+ pulses,  symmetric, No pitting edema  Skin:  Dry, warm to touch, negative for any Rashes,  Wounds: Please see nursing documentation        Condition at discharge: good  The results of significant diagnostics from this hospitalization (including imaging, microbiology, ancillary and laboratory) are listed below for reference.   Imaging Studies: IR Fluoro Guide CV Line Right Result Date: 05/30/2023 CLINICAL DATA:  Sepsis, needs durable venous access for treatment regimen EXAM: TUNNELED CENTRAL VENOUS CATHETER PLACEMENT WITH ULTRASOUND AND FLUOROSCOPIC GUIDANCE TECHNIQUE: The procedure, risks, benefits, and alternatives were explained to the patient. Questions regarding the procedure were encouraged and answered. The patient understands and consents to the procedure. Patency of the right IJ vein was confirmed with ultrasound with image documentation. An appropriate skin site was determined. Region was prepped using maximum barrier technique including cap and mask, sterile gown, sterile gloves, large sterile sheet, and Chlorhexidine  as cutaneous antisepsis. The region was infiltrated locally with 1% lidocaine . Under real-time ultrasound guidance, the right  IJ vein was accessed with a 21 gauge micropuncture needle; the needle tip within the vein was confirmed with ultrasound image documentation. 80F dual-lumen cuffed PowerLine tunneled from a right anterior chest wall approach to the dermatotomy site. Needle exchanged over the 018 guidewire for transitional dilator, through which the catheter which had been cut to 29 cm was advanced under intermittent fluoroscopy, positioned with its tip at the cavoatrial junction. Spot chest radiograph confirms  good catheter position. No pneumothorax. Catheter was flushed per protocol. Catheter secured externally with O Prolene suture. The right IJ dermatotomy site was closed with Dermabond. COMPLICATIONS: COMPLICATIONS None immediate FLUOROSCOPY TIME:  Radiation Exposure Index (as provided by the fluoroscopic device): 12.8 mGy air Kerma COMPARISON:  None Available. IMPRESSION: 1. Technically successful placement of tunneled right IJ tunneled dual-lumen power injectable catheter with ultrasound and fluoroscopic guidance. Ready for routine use. Electronically Signed   By: Nicoletta Barrier M.D.   On: 05/30/2023 16:22   IR US  Guide Vasc Access Right Result Date: 05/30/2023 CLINICAL DATA:  Sepsis, needs durable venous access for treatment regimen EXAM: TUNNELED CENTRAL VENOUS CATHETER PLACEMENT WITH ULTRASOUND AND FLUOROSCOPIC GUIDANCE TECHNIQUE: The procedure, risks, benefits, and alternatives were explained to the patient. Questions regarding the procedure were encouraged and answered. The patient understands and consents to the procedure. Patency of the right IJ vein was confirmed with ultrasound with image documentation. An appropriate skin site was determined. Region was prepped using maximum barrier technique including cap and mask, sterile gown, sterile gloves, large sterile sheet, and Chlorhexidine  as cutaneous antisepsis. The region was infiltrated locally with 1% lidocaine . Under real-time ultrasound guidance, the right IJ vein was  accessed with a 21 gauge micropuncture needle; the needle tip within the vein was confirmed with ultrasound image documentation. 46F dual-lumen cuffed PowerLine tunneled from a right anterior chest wall approach to the dermatotomy site. Needle exchanged over the 018 guidewire for transitional dilator, through which the catheter which had been cut to 29 cm was advanced under intermittent fluoroscopy, positioned with its tip at the cavoatrial junction. Spot chest radiograph confirms good catheter position. No pneumothorax. Catheter was flushed per protocol. Catheter secured externally with O Prolene suture. The right IJ dermatotomy site was closed with Dermabond. COMPLICATIONS: COMPLICATIONS None immediate FLUOROSCOPY TIME:  Radiation Exposure Index (as provided by the fluoroscopic device): 12.8 mGy air Kerma COMPARISON:  None Available. IMPRESSION: 1. Technically successful placement of tunneled right IJ tunneled dual-lumen power injectable catheter with ultrasound and fluoroscopic guidance. Ready for routine use. Electronically Signed   By: Nicoletta Barrier M.D.   On: 05/30/2023 16:22   DG Chest Port 1 View Result Date: 05/26/2023 CLINICAL DATA:  Weakness EXAM: PORTABLE CHEST 1 VIEW COMPARISON:  10/17/2022 FINDINGS: Mild atelectasis or infiltrate at left base. Stable cardiomediastinal silhouette. No pneumothorax IMPRESSION: Mild atelectasis or infiltrate at left base. Electronically Signed   By: Esmeralda Hedge M.D.   On: 05/26/2023 21:30    Microbiology: Results for orders placed or performed during the hospital encounter of 05/26/23  Resp panel by RT-PCR (RSV, Flu A&B, Covid) Anterior Nasal Swab     Status: None   Collection Time: 05/26/23  7:46 PM   Specimen: Anterior Nasal Swab  Result Value Ref Range Status   SARS Coronavirus 2 by RT PCR NEGATIVE NEGATIVE Final    Comment: (NOTE) SARS-CoV-2 target nucleic acids are NOT DETECTED.  The SARS-CoV-2 RNA is generally detectable in upper respiratory specimens  during the acute phase of infection. The lowest concentration of SARS-CoV-2 viral copies this assay can detect is 138 copies/mL. A negative result does not preclude SARS-Cov-2 infection and should not be used as the sole basis for treatment or other patient management decisions. A negative result may occur with  improper specimen collection/handling, submission of specimen other than nasopharyngeal swab, presence of viral mutation(s) within the areas targeted by this assay, and inadequate number of viral copies(<138 copies/mL). A negative result must be combined with clinical  observations, patient history, and epidemiological information. The expected result is Negative.  Fact Sheet for Patients:  BloggerCourse.com  Fact Sheet for Healthcare Providers:  SeriousBroker.it  This test is no t yet approved or cleared by the [place]  FDA and  has been authorized for detection and/or diagnosis of SARS-CoV-2 by FDA under an Emergency Use Authorization (EUA). This EUA will remain  in effect (meaning this test can be used) for the duration of the COVID-19 declaration under Section 564(b)(1) of the Act, 21 U.S.C.section 360bbb-3(b)(1), unless the authorization is terminated  or revoked sooner.       Influenza A by PCR NEGATIVE NEGATIVE Final   Influenza B by PCR NEGATIVE NEGATIVE Final    Comment: (NOTE) The Xpert Xpress SARS-CoV-2/FLU/RSV plus assay is intended as an aid in the diagnosis of influenza from Nasopharyngeal swab specimens and should not be used as a sole basis for treatment. Nasal washings and aspirates are unacceptable for Xpert Xpress SARS-CoV-2/FLU/RSV testing.  Fact Sheet for Patients: BloggerCourse.com  Fact Sheet for Healthcare Providers: SeriousBroker.it  This test is not yet approved or cleared by the [place]  FDA and has been authorized for detection  and/or diagnosis of SARS-CoV-2 by FDA under an Emergency Use Authorization (EUA). This EUA will remain in effect (meaning this test can be used) for the duration of the COVID-19 declaration under Section 564(b)(1) of the Act, 21 U.S.C. section 360bbb-3(b)(1), unless the authorization is terminated or revoked.     Resp Syncytial Virus by PCR NEGATIVE NEGATIVE Final    Comment: (NOTE) Fact Sheet for Patients: BloggerCourse.com  Fact Sheet for Healthcare Providers: SeriousBroker.it  This test is not yet approved or cleared by the [place]  FDA and has been authorized for detection and/or diagnosis of SARS-CoV-2 by FDA under an Emergency Use Authorization (EUA). This EUA will remain in effect (meaning this test can be used) for the duration of the COVID-19 declaration under Section 564(b)(1) of the Act, 21 U.S.C. section 360bbb-3(b)(1), unless the authorization is terminated or revoked.  Performed at [facility], [address] [place], [place] [address]   Urine Culture     Status: Abnormal   Collection Time: 05/26/23  7:46 PM   Specimen: Urine, Random  Result Value Ref Range Status   Specimen Description   Final    URINE, RANDOM Performed at [facility], [address], [place], [place] [address]    Special Requests   Final    NONE Reflexed from [id number] Performed at [facility], [address] [place], [place] [address]    Culture (A)  Final    >=100,000 COLONIES/mL ESCHERICHIA COLI Confirmed Extended Spectrum Beta-Lactamase Producer (ESBL).  In bloodstream infections from ESBL organisms, carbapenems are preferred over piperacillin/tazobactam. They are shown to have a lower risk of mortality.    Report Status 05/29/2023 FINAL  Final   Organism ID, Bacteria ESCHERICHIA COLI (A)  Final      Susceptibility   Escherichia coli - MIC*    AMPICILLIN >=32 RESISTANT Resistant     CEFAZOLIN  >=64 RESISTANT Resistant     CEFEPIME  16 RESISTANT Resistant     CEFTRIAXONE  >=64 RESISTANT Resistant     CIPROFLOXACIN  1 RESISTANT Resistant     GENTAMICIN  >=16 RESISTANT Resistant     IMIPENEM <=0.25 SENSITIVE Sensitive     NITROFURANTOIN 64 INTERMEDIATE Intermediate     TRIMETH/SULFA >=320 RESISTANT Resistant     AMPICILLIN/SULBACTAM >=32 RESISTANT Resistant     PIP/TAZO 64 INTERMEDIATE Intermediate ug/mL    * >=  100,000 COLONIES/mL ESCHERICHIA COLI  Blood Culture (routine x 2)     Status: None   Collection Time: 05/26/23  8:00 PM   Specimen: BLOOD  Result Value Ref Range Status   Specimen Description BLOOD RIGHT ANTECUBITAL  Final   Special Requests   Final    BOTTLES DRAWN AEROBIC AND ANAEROBIC Blood Culture adequate volume   Culture   Final    NO GROWTH 5 DAYS Performed at [facility], [address], [place], [place] [address]    Report Status 05/31/2023 FINAL  Final  Blood Culture (routine x 2)     Status: None   Collection Time: 05/26/23  8:12 PM   Specimen: BLOOD  Result Value Ref Range Status   Specimen Description BLOOD RIGHT ANTECUBITAL  Final   Special Requests   Final    BOTTLES DRAWN AEROBIC AND ANAEROBIC Blood Culture adequate volume   Culture   Final    NO GROWTH 5 DAYS Performed at [facility], [address] [place], [place] [address]    Report Status 05/31/2023 FINAL  Final    Labs: CBC: Recent Labs  Lab 05/26/23 2017 05/27/23 0511 05/28/23 0530 05/30/23 0501 05/31/23 0446  WBC 10.8* 15.1* 12.4* 5.2 4.4  NEUTROABS 8.6*  --   --   --   --   HGB 12.5* 12.1* 11.3* 10.6* 10.8*  HCT 39.4 39.5 34.7* 33.4* 33.4*  MCV 83.8 87.2 83.2 83.3 82.9  PLT 165 153 146* 155 PLATELET CLUMPS NOTED ON SMEAR, UNABLE TO ESTIMATE   Basic Metabolic Panel: Recent Labs  Lab 05/27/23 0511 05/28/23 0530 05/29/23 1142 05/30/23 0501 05/31/23 0446  NA 140 141 137 140 138  K 3.3* 3.0* 3.4* 3.1* 3.5  CL 107 108 103 106 105  CO2 21* 26 23 22  21*  GLUCOSE 44* 154* 130* 184* 134*  BUN 48* 40* 46* 40* 43*   CREATININE 3.64* 4.05* 4.31* 4.15* 4.18*  CALCIUM 8.5* 8.5* 8.3* 8.5* 8.6*  MG 1.9  --   --  1.9 1.9  PHOS 3.1 3.1 3.0 3.1 3.0   Liver Function Tests: Recent Labs  Lab 05/26/23 2017 05/28/23 0530 05/29/23 1142 05/30/23 0501 05/31/23 0446  AST 22  --   --   --   --   ALT 26  --   --   --   --   ALKPHOS 76  --   --   --   --   BILITOT 0.8  --   --   --   --   PROT 7.0  --   --   --   --   ALBUMIN 3.6 2.6* 2.4* 2.4* 2.5*   CBG: Recent Labs  Lab 05/30/23 1121 05/30/23 1659 05/30/23 2158 05/31/23 0303 05/31/23 0838  GLUCAP 145* 179* 141* 129* 136*    Discharge time spent: greater than 30 minutes.  Signed: Bobbetta Burnet, MD [facility] 05/31/2023

## 2023-05-31 NOTE — Progress Notes (Signed)
 Physical Therapy Treatment Patient Details Name: Carlos Gill MRN: [id number] DOB: Jul 20, 1940 Today's Date: 05/31/2023   History of Present Illness Pt is an 83 y.o. male who presented 05/26/23 with fever and aches. Admitted with severe sepsis due to GNR UTI. PMH includes: type 2 diabetes mellitus, hypertension, CKD stage 4, HLD, dyslipidemia and OSA on CPAP.    PT Comments  Pt received in bed, reports he feels ready to go home, awaiting [facility] approval for meds. Pt mod I with bed mobility and transfers. Ambulated in hallway without AD and no LOB but pt has minimal ability to change pace and is cautious with changes in direction. On stairs, pt had more difficulty stepping up with R than L and upon further discussion endorses R hip discomfort past few months. Gave pt balance exercises to do at home as well as hip stretches. If it continues to bother him recommend outpt PT. PT will continue to follow.     If plan is discharge home, recommend the following: Assistance with cooking/housework;Assist for transportation;Help with stairs or ramp for entrance   Can travel by private vehicle        Equipment Recommendations  None recommended by PT    Recommendations for Other Services       Precautions / Restrictions Precautions Precautions: Fall Recall of Precautions/Restrictions: Intact Restrictions Weight Bearing Restrictions Per Provider Order: No     Mobility  Bed Mobility Overal bed mobility: Modified Independent             General bed mobility comments: HOB flat, no rail    Transfers Overall transfer level: Needs assistance Equipment used: None Transfers: Sit to/from Stand Sit to Stand: Modified independent (Device/Increase time)           General transfer comment: pt donned shoes independently and stood without assist    Ambulation/Gait Ambulation/Gait assistance: Supervision Gait Distance (Feet): 200 Feet Assistive device: None Gait Pattern/deviations:  Step-through pattern, Decreased stride length, Decreased dorsiflexion - left, Decreased dorsiflexion - right Gait velocity: reduced Gait velocity interpretation: 1.31 - 2.62 ft/sec, indicative of limited community ambulator   General Gait Details: Pt ambulates with decreased speed and  foot clearance, R>L. No LOB but pt with minimal ability to increased pace and changes directions slowly with caution   Stairs Stairs: Yes Stairs assistance: Supervision Stair Management: One rail Right, Alternating pattern, Forwards Number of Stairs: 5 General stair comments: is able to use alternating pattern but could tell RLE was increased effort for him and he endorsed discomfort R hip which began a few months ago   Wheelchair Mobility     Tilt Bed    Modified Rankin (Stroke Patients Only)       Balance Overall balance assessment: Mild deficits observed, not formally tested                                          Communication Communication Communication: No apparent difficulties  Cognition Arousal: Alert Behavior During Therapy: WFL for tasks assessed/performed   PT - Cognitive impairments: No apparent impairments                         Following commands: Intact      Cueing Cueing Techniques: Verbal cues  Exercises Other Exercises Other Exercises: SL stance at sink, 30 secs R and L Other Exercises: R Hip flex stretch  x30 secs Other Exercises: R outer hip stretch in sitting x30 secs    General Comments General comments (skin integrity, edema, etc.): VSS      Pertinent Vitals/Pain Pain Assessment Pain Assessment: Faces    Home Living                          Prior Function            PT Goals (current goals can now be found in the care plan section) Acute Rehab PT Goals Patient Stated Goal: to improve and return home PT Goal Formulation: With patient Time For Goal Achievement: 06/12/23 Potential to Achieve Goals:  Good Progress towards PT goals: Progressing toward goals    Frequency    Min 1X/week      PT Plan      Co-evaluation              AM-PAC PT "6 Clicks" Mobility   Outcome Measure  Help needed turning from your back to your side while in a flat bed without using bedrails?: None Help needed moving from lying on your back to sitting on the side of a flat bed without using bedrails?: None Help needed moving to and from a bed to a chair (including a wheelchair)?: A Little Help needed standing up from a chair using your arms (e.g., wheelchair or bedside chair)?: A Little Help needed to walk in hospital room?: A Little Help needed climbing 3-5 steps with a railing? : A Little 6 Click Score: 20    End of Session Equipment Utilized During Treatment: Gait belt Activity Tolerance: Patient tolerated treatment well Patient left: with call bell/phone within reach;in bed (sitting EOB with lunch tray) Nurse Communication: Mobility status PT Visit Diagnosis: Unsteadiness on feet (R26.81);Other abnormalities of gait and mobility (R26.89);Muscle weakness (generalized) (M62.81);Difficulty in walking, not elsewhere classified (R26.2)     Time: 0737-1062 PT Time Calculation (min) (ACUTE ONLY): 16 min  Charges:    $Gait Training: 8-22 mins PT General Charges $$ ACUTE PT VISIT: 1 Visit                     Amey Ka, PT  Acute Rehab Services Secure chat preferred Office [phone]    Deloris Fetters Nick Stults 05/31/2023, 12:59 PM

## 2023-05-31 NOTE — Progress Notes (Signed)
 Transport arrived to take pt down to discharge lounge. Pt left with all belongings and paperwork

## 2023-05-31 NOTE — Progress Notes (Signed)
 1610- When preforming assessment on pt, noticed that the pt's R chest port was not in active LDAs.

## 2023-05-31 NOTE — TOC Transition Note (Signed)
 Transition of Care [facility]) - Discharge Note   Patient Details  Name: Carlos Gill MRN: [id number] Date of Birth: [birth year]  [facility]) CM/SW Contact:  Tom-Johnson, Angelique Ken, RN Phone Number: 05/31/2023, 11:58 AM   Clinical Narrative:     Insurance [employer] is approved for home IV abx. Patient is Medically ready for discharge.  Grandson, Jamani will transport at discharge. No further TOC needs noted.       Final next level of care: Home/Self Care Barriers to Discharge: Barriers Resolved   Patient Goals and CMS Choice Patient states their goals for this hospitalization and ongoing recovery are:: To return home CMS Medicare.gov Compare Post Acute Care list provided to:: Patient Choice offered to / list presented to : NA      Discharge Placement                Patient to be transferred to facility by: Grandson. Name of family member notified: Joelyn Music    Discharge Plan and Services Additional resources added to the After Visit Summary for   In-house Referral: Clinical Social Work              DME Arranged: N/A DME Agency: NA       HH Arranged: NA HH Agency: NA        Social Drivers of Health (SDOH) Interventions SDOH Screenings   Food Insecurity: No Food Insecurity (05/27/2023)  Housing: Low Risk  (05/27/2023)  Transportation Needs: No Transportation Needs (05/27/2023)  Utilities: Not At Risk (05/27/2023)  Financial Resource Strain: Low Risk  (03/07/2023)   Received [facility]  Social Connections: Unknown (05/27/2023)  Stress: No Stress Concern Present (11/10/2021)   Received from [facility]  Tobacco Use: Medium Risk (05/31/2023)     Readmission Risk Interventions    05/30/2023    3:21 PM 05/27/2023   10:26 AM  Readmission Risk Prevention Plan  Transportation Screening Complete Complete  PCP or Specialist Appt within 3-5 Days Complete   HRI or Home Care Consult Complete Complete  Social Work Consult  for Recovery Care Planning/Counseling Complete   Palliative Care Screening Not Applicable Not Applicable  Medication Review Oceanographer) Referral to Pharmacy Complete

## 2023-05-31 NOTE — Progress Notes (Signed)
 Subjective: Was not discharged yesterday, awaiting [facility] okay with antibiotics.  Denies shortness of breath, physical therapy  in room currently  Objective Vital signs in last 24 hours: Vitals:   05/30/23 2025 05/31/23 0453 05/31/23 0838 05/31/23 0842  BP: (!) 191/80 (!) 141/53 (!) 146/50 (!) 146/50  Pulse: 75 73 72 72  Resp: 18 18 18    Temp: 98.3 F (36.8 C) 98.7 F (37.1 C) 98.4 F (36.9 C)   TempSrc:      SpO2: 99% 100% 100%   Weight:      Height:       Weight change:   Physical Exam: General: alert ,sitting up on side of bed NAD,  Heart: RRR , no  mrg Lungs: CTA  non labored  Abdomen: NABS, soft , ND  Extremities: no pedal edema , R chest pic line  Dialysis Access: PD cath      OP PD  orders: [facility] (Dr. Carrolyn Clan). CCPD Sun, Mon, Wed, Fri nightly. 8 hrs, 4 cycles, 2500cc fill volume, target dwell time ~1 hr . No last fill. Typically uses all 2.5% bags.    Problem/Plan:  Sepsis =   UTI (ESBL E. coli urinary tract) ABX  per ID consult meropenem through 06/05/2023. Via pic, evidently [facility] has to okay antibiotics ESRD - CCPD  continue tonight 2.5 % unless discharged, otherwise, fu with  (Dr. Carrolyn Clan). Anemia - hgb 10.8 Secondary hyperparathyroidism - phos  @ goal, C Ca  9.4  on calcitriol   HTN/volume - bp stable on excess vol per exam , contin same meds bp  Charlynne Coombes, PA-C [facility] Beeper [phone] 05/31/2023,11:57 AM  LOS: 5 days   Labs: Basic Metabolic Panel: Recent Labs  Lab 05/29/23 1142 05/30/23 0501 05/31/23 0446  NA 137 140 138  K 3.4* 3.1* 3.5  CL 103 106 105  CO2 23 22 21*  GLUCOSE 130* 184* 134*  BUN 46* 40* 43*  CREATININE 4.31* 4.15* 4.18*  CALCIUM 8.3* 8.5* 8.6*  PHOS 3.0 3.1 3.0   Liver Function Tests: Recent Labs  Lab 05/26/23 2017 05/28/23 0530 05/29/23 1142 05/30/23 0501 05/31/23 0446  AST 22  --   --   --   --   ALT 26  --   --   --   --   ALKPHOS 76  --   --   --   --   BILITOT 0.8  --   --   --   --    PROT 7.0  --   --   --   --   ALBUMIN 3.6   < > 2.4* 2.4* 2.5*   < > = values in this interval not displayed.   No results for input(s): "LIPASE", "AMYLASE" in the last 168 hours. No results for input(s): "AMMONIA" in the last 168 hours. CBC: Recent Labs  Lab 05/26/23 2017 05/27/23 0511 05/28/23 0530 05/30/23 0501 05/31/23 0446  WBC 10.8* 15.1* 12.4* 5.2 4.4  NEUTROABS 8.6*  --   --   --   --   HGB 12.5* 12.1* 11.3* 10.6* 10.8*  HCT 39.4 39.5 34.7* 33.4* 33.4*  MCV 83.8 87.2 83.2 83.3 82.9  PLT 165 153 146* 155 PLATELET CLUMPS NOTED ON SMEAR, UNABLE TO ESTIMATE   Cardiac Enzymes: No results for input(s): "CKTOTAL", "CKMB", "CKMBINDEX", "TROPONINI" in the last 168 hours. CBG: Recent Labs  Lab 05/30/23 1121 05/30/23 1659 05/30/23 2158 05/31/23 0303 05/31/23 0838  GLUCAP 145* 179* 141* 129* 136*  Studies/Results: IR Fluoro Guide CV Line Right Result Date: 05/30/2023 CLINICAL DATA:  Sepsis, needs durable venous access for treatment regimen EXAM: TUNNELED CENTRAL VENOUS CATHETER PLACEMENT WITH ULTRASOUND AND FLUOROSCOPIC GUIDANCE TECHNIQUE: The procedure, risks, benefits, and alternatives were explained to the patient. Questions regarding the procedure were encouraged and answered. The patient understands and consents to the procedure. Patency of the right IJ vein was confirmed with ultrasound with image documentation. An appropriate skin site was determined. Region was prepped using maximum barrier technique including cap and mask, sterile gown, sterile gloves, large sterile sheet, and Chlorhexidine  as cutaneous antisepsis. The region was infiltrated locally with 1% lidocaine . Under real-time ultrasound guidance, the right IJ vein was accessed with a 21 gauge micropuncture needle; the needle tip within the vein was confirmed with ultrasound image documentation. 86F dual-lumen cuffed PowerLine tunneled from a right anterior chest wall approach to the dermatotomy site. Needle  exchanged over the 018 guidewire for transitional dilator, through which the catheter which had been cut to 29 cm was advanced under intermittent fluoroscopy, positioned with its tip at the cavoatrial junction. Spot chest radiograph confirms good catheter position. No pneumothorax. Catheter was flushed per protocol. Catheter secured externally with O Prolene suture. The right IJ dermatotomy site was closed with Dermabond. COMPLICATIONS: COMPLICATIONS None immediate FLUOROSCOPY TIME:  Radiation Exposure Index (as provided by the fluoroscopic device): 12.8 mGy air Kerma COMPARISON:  None Available. IMPRESSION: 1. Technically successful placement of tunneled right IJ tunneled dual-lumen power injectable catheter with ultrasound and fluoroscopic guidance. Ready for routine use. Electronically Signed   By: Nicoletta Barrier M.D.   On: 05/30/2023 16:22   IR US  Guide Vasc Access Right Result Date: 05/30/2023 CLINICAL DATA:  Sepsis, needs durable venous access for treatment regimen EXAM: TUNNELED CENTRAL VENOUS CATHETER PLACEMENT WITH ULTRASOUND AND FLUOROSCOPIC GUIDANCE TECHNIQUE: The procedure, risks, benefits, and alternatives were explained to the patient. Questions regarding the procedure were encouraged and answered. The patient understands and consents to the procedure. Patency of the right IJ vein was confirmed with ultrasound with image documentation. An appropriate skin site was determined. Region was prepped using maximum barrier technique including cap and mask, sterile gown, sterile gloves, large sterile sheet, and Chlorhexidine  as cutaneous antisepsis. The region was infiltrated locally with 1% lidocaine . Under real-time ultrasound guidance, the right IJ vein was accessed with a 21 gauge micropuncture needle; the needle tip within the vein was confirmed with ultrasound image documentation. 86F dual-lumen cuffed PowerLine tunneled from a right anterior chest wall approach to the dermatotomy site. Needle exchanged  over the 018 guidewire for transitional dilator, through which the catheter which had been cut to 29 cm was advanced under intermittent fluoroscopy, positioned with its tip at the cavoatrial junction. Spot chest radiograph confirms good catheter position. No pneumothorax. Catheter was flushed per protocol. Catheter secured externally with O Prolene suture. The right IJ dermatotomy site was closed with Dermabond. COMPLICATIONS: COMPLICATIONS None immediate FLUOROSCOPY TIME:  Radiation Exposure Index (as provided by the fluoroscopic device): 12.8 mGy air Kerma COMPARISON:  None Available. IMPRESSION: 1. Technically successful placement of tunneled right IJ tunneled dual-lumen power injectable catheter with ultrasound and fluoroscopic guidance. Ready for routine use. Electronically Signed   By: Nicoletta Barrier M.D.   On: 05/30/2023 16:22   Medications:  dialysis solution 2.5% low-MG/low-CA     meropenem (MERREM) IV 500 mg (05/31/23 0850)    aspirin  EC  81 mg Oral q morning   calcitRIOL   0.5 mcg Oral BID   gabapentin   100 mg Oral Daily   And   gabapentin   200 mg Oral QHS   gentamicin  cream  1 Application Topical Daily   heparin   5,000 Units Subcutaneous Q8H   hydrALAZINE   100 mg Oral TID   insulin  aspart  0-6 Units Subcutaneous TID WC   insulin  aspart  3 Units Subcutaneous TID WC   insulin  glargine-yfgn  15 Units Subcutaneous QHS   metoprolol  tartrate  100 mg Oral Daily   And   metoprolol  tartrate  75 mg Oral QHS   pantoprazole   40 mg Oral Daily   sodium chloride  flush  3 mL Intravenous Q12H   tamsulosin   0.4 mg Oral QHS

## 2023-06-01 ENCOUNTER — Other Ambulatory Visit (HOSPITAL_COMMUNITY): Payer: Self-pay | Admitting: Family Medicine

## 2023-06-01 DIAGNOSIS — A419 Sepsis, unspecified organism: Secondary | ICD-10-CM

## 2023-06-01 DIAGNOSIS — N3001 Acute cystitis with hematuria: Secondary | ICD-10-CM

## 2023-06-01 DIAGNOSIS — R6520 Severe sepsis without septic shock: Secondary | ICD-10-CM

## 2023-06-02 NOTE — Progress Notes (Signed)
 Late note Entry- Jun 02, 2023  Received a call from [facility] staff requesting that d/c summary and renal notes be faxed to clinic for continuation of care. Info faxed today per clinic request.   Lauraine Polite Renal Navigator [phone]

## 2023-06-03 ENCOUNTER — Other Ambulatory Visit: Payer: Self-pay | Admitting: Radiology

## 2023-06-06 ENCOUNTER — Ambulatory Visit (HOSPITAL_COMMUNITY)
Admission: RE | Admit: 2023-06-06 | Discharge: 2023-06-06 | Disposition: A | Discharge status: Home / Self Care | Source: Ambulatory Visit | Attending: Family Medicine | Admitting: Family Medicine

## 2023-06-06 DIAGNOSIS — N3001 Acute cystitis with hematuria: Secondary | ICD-10-CM | POA: Insufficient documentation

## 2023-06-06 DIAGNOSIS — Z452 Encounter for adjustment and management of vascular access device: Secondary | ICD-10-CM | POA: Insufficient documentation

## 2023-06-06 DIAGNOSIS — R6520 Severe sepsis without septic shock: Secondary | ICD-10-CM | POA: Insufficient documentation

## 2023-06-06 DIAGNOSIS — A419 Sepsis, unspecified organism: Secondary | ICD-10-CM

## 2023-06-06 HISTORY — PX: IR REMOVAL TUN CV CATH W/O FL: IMG2289

## 2023-06-06 NOTE — Procedures (Addendum)
 Interventional Radiology Procedure Note  Risks and benefits of removal of tunneled central catheter were discussed with the patient including, but not limited to bleeding, hematoma, infection, damage to adjacent structures.  All of the patient's questions were answered, patient is agreeable to proceed. Consent signed and in chart. PROCEDURE SUMMARY:  Successful removal of tunneled dialysis catheter.  No complications.   EBL = trace  Please see full dictation in imaging section of Epic for procedure details.  Marceil Sensor NP 06/06/2023 3:06 PM

## 2023-06-08 ENCOUNTER — Encounter: Payer: Self-pay | Admitting: Family

## 2023-06-08 ENCOUNTER — Ambulatory Visit (INDEPENDENT_AMBULATORY_CARE_PROVIDER_SITE_OTHER): Admitting: Family

## 2023-06-08 ENCOUNTER — Other Ambulatory Visit: Payer: Self-pay

## 2023-06-08 VITALS — BP 114/61 | HR 64 | Temp 97.9°F | Ht 72.0 in | Wt 217.0 lb

## 2023-06-08 DIAGNOSIS — Z1612 Extended spectrum beta lactamase (ESBL) resistance: Secondary | ICD-10-CM | POA: Diagnosis not present

## 2023-06-08 DIAGNOSIS — A498 Other bacterial infections of unspecified site: Secondary | ICD-10-CM

## 2023-06-08 DIAGNOSIS — N10 Acute tubulo-interstitial nephritis: Secondary | ICD-10-CM | POA: Diagnosis not present

## 2023-06-08 NOTE — Progress Notes (Signed)
 Subjective:    Patient ID: Carlos Gill, male    DOB: July 19, 1940, 83 y.o.   MRN: [id number]  Chief Complaint  Patient presents with   Hospitalization Follow-up    ESBL E. coli urinary tract infection    HPI:  Carlos Gill is a 83 y.o. male on peritoneal dialysis recently admitted with decreased appetite and low-grade fever and found to have ESBL E. coli urinary tract infection.  There is no evidence of bacteremia.  Plan of care was for 7 days of meropenem  secondary to multidrug-resistant organism through 06/05/2023.  Tunneled catheter was placed.  Here today for follow-up.  Carlos Gill has been doing well since leaving the hospital and completed his meropenem  with no adverse side effects on 06/07/2023.  Feeling well and back to baseline with no current symptoms.  Catheter was removed on 06/07/2023 without complication.  Denies fevers, chills, or sweats.  Completing peritoneal dialysis with no problems.   Allergies  Allergen Reactions   Statins Swelling and Other (See Comments)    Muscle aches   Tape Rash    Pt has rash on L arm from tape "used with stitches"    Wound Dressing Adhesive Dermatitis    Pt has rash on L arm from tape "used with stitches"      Outpatient Medications Prior to Visit  Medication Sig Dispense Refill   acetaminophen  (TYLENOL ) 500 MG tablet Take 1,000 mg by mouth daily as needed for moderate pain.     aspirin  EC 81 MG tablet Take 81 mg by mouth every morning.     calcitRIOL  (ROCALTROL ) 0.5 MCG capsule Take 0.5 mcg by mouth 2 (two) times daily.     Continuous Glucose Sensor (FREESTYLE LIBRE 2 SENSOR) MISC as directed topically change every 14 days to monitor blood glucose continuously     ezetimibe  (ZETIA ) 10 MG tablet Take 10 mg by mouth daily.     furosemide  (LASIX ) 40 MG tablet Take 40 mg by mouth 2 (two) times daily.     gabapentin  (NEURONTIN ) 100 MG capsule Take 100-200 mg by mouth See admin instructions. Take 100 mg in the morning and 200 mg at bedtime      hydrALAZINE  (APRESOLINE ) 100 MG tablet Take 100 mg by mouth 3 (three) times daily.     insulin  aspart (NOVOLOG ) 100 UNIT/ML injection Inject 6-18 Units into the skin 3 (three) times daily with meals. Per sliding scale     insulin  glargine (LANTUS ) 100 units/mL SOLN Inject 40 Units into the skin 2 (two) times daily.     metoprolol  tartrate (LOPRESSOR ) 50 MG tablet Take 75-100 mg by mouth See admin instructions. Take 100 mg in the morning and 75 mg at bedtime     Multiple Vitamins-Minerals (PRESERVISION AREDS 2) CAPS Take 1 capsule by mouth 2 (two) times daily.     omeprazole (PRILOSEC) 20 MG capsule Take 20 mg by mouth daily.     tamsulosin  (FLOMAX ) 0.4 MG CAPS capsule Take 0.4 mg by mouth at bedtime.     No facility-administered medications prior to visit.     Past Medical History:  Diagnosis Date   Abnormal result of other cardiovascular function study 04/21/2021   Abnormality of plasma protein, unspecified 04/21/2021   Acute kidney failure, unspecified (HCC) 04/21/2021   Acute on chronic diastolic (congestive) heart failure (HCC) 04/21/2021   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   Adverse effect of antihyperlipidemic and antiarteriosclerotic drugs, initial encounter 12/19/2017   Benign prostatic hyperplasia with urinary obstruction  04/21/2021   Formatting of this note might be different from the original. Last Assessment & Plan:  Formatting of this note is different from the original.  - We will continue Flomax .   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   Diabetes mellitus without complication (HCC)    Enlarged prostate 04/21/2021   Epididymitis 11/09/2021   Essential hypertension 11/02/2021   Fatigue 12/23/2015   GERD without esophagitis 11/02/2021   Hyperglycemia due to diabetes mellitus (HCC) 11/09/2021   Hyperlipidemia    Hypertension    Macular degeneration  04/21/2021   Nonexudative age-related macular degeneration 04/19/2018   Obesity 04/11/2017   Osteoarthritis 11/11/2008   Other chest pain 04/21/2021   Proteinuria 01/21/2021   Rhinitis, chronic 03/09/2021   Shortness of breath 04/21/2021   Sleep apnea    cpap   Trochanteric bursitis of right hip 04/27/2017   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     Past Surgical History:  Procedure Laterality Date   ACHILLES TENDON REPAIR Left 2014   AV FISTULA PLACEMENT Left 08/16/2022   Procedure: LEFT ARM  BRACHIOCEPHALIC ARTERIOVENOUS (AV) FISTULA CREATION;  Surgeon: Carlene Che, MD;  Location: [facility] OR;  Service: Vascular;  Laterality: Left;  with regional block   CATARACT EXTRACTION W/ INTRAOCULAR LENS  IMPLANT, BILATERAL  2011   IR FLUORO GUIDE CV LINE RIGHT  05/30/2023   IR REMOVAL TUN CV CATH W/O FL  06/06/2023   IR US  GUIDE VASC ACCESS RIGHT  05/30/2023       Review of Systems  Constitutional:  Negative for chills, diaphoresis, fatigue and fever.  Respiratory:  Negative for cough, chest tightness, shortness of breath and wheezing.   Cardiovascular:  Negative for chest pain.  Gastrointestinal:  Negative for abdominal pain, diarrhea, nausea and vomiting.      Objective:    BP 114/61   Pulse 64   Temp 97.9 F (36.6 C) (Oral)   Ht 6' (1.829 m)   Wt 217 lb (98.4 kg)   SpO2 97%   BMI 29.43 kg/m  Nursing note and vital signs reviewed.  Physical Exam Constitutional:      General: He is not in acute distress.    Appearance: He is well-developed.  Cardiovascular:     Rate and Rhythm: Normal rate and regular rhythm.     Heart sounds: Normal heart sounds.  Pulmonary:     Effort: Pulmonary effort is normal.     Breath sounds: Normal breath sounds.  Skin:    General: Skin is warm and dry.  Neurological:     Mental Status: He is alert and oriented to person, place, and time.          No data to display             Assessment & Plan:     Patient Active Problem List   Diagnosis Date Noted   Severe sepsis (HCC) 05/28/2023   UTI (urinary tract infection) 05/27/2023   Sepsis secondary to UTI (HCC) 05/26/2023   Generalized weakness 10/17/2022   Anemia of chronic disease 10/17/2022   Elevated brain natriuretic peptide (BNP) level 10/17/2022   Chronic kidney disease, stage V (HCC) 10/17/2022   Mixed hyperlipidemia 10/17/2022   Hypoalbuminemia due to protein-calorie malnutrition (HCC) 10/17/2022   OSA on CPAP 10/17/2022   Elevated troponin 10/17/2022   Peripheral neuropathy 10/17/2022   Hyperglycemia due to  diabetes mellitus (HCC) 11/09/2021   Epididymitis 11/09/2021   Chronic kidney disease, stage 4 (severe) (HCC) 11/09/2021   Sepsis due to gram-negative UTI (HCC) 11/02/2021   Acute pyelonephritis 11/02/2021   Essential hypertension 11/02/2021   GERD without esophagitis 11/02/2021   Dyslipidemia 11/02/2021   Renal mass, right 11/02/2021   Benign prostatic hyperplasia with urinary obstruction 04/21/2021   Acute kidney failure, unspecified (HCC) 04/21/2021   Shortness of breath 04/21/2021   Other chest pain 04/21/2021   Macular degeneration 04/21/2021   Enlarged prostate 04/21/2021   Bilateral pseudophakia 04/21/2021   Acute on chronic diastolic (congestive) heart failure (HCC) 04/21/2021   Abnormality of plasma protein, unspecified 04/21/2021   Abnormal result of other cardiovascular function study 04/21/2021   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   Rhinitis, chronic 03/09/2021   Proteinuria 01/21/2021   Nonexudative age-related macular degeneration 04/19/2018   Adverse effect of antihyperlipidemic and antiarteriosclerotic drugs, initial encounter 12/19/2017   Trochanteric bursitis of right hip 04/27/2017   Obesity (BMI 30-39.9) 04/11/2017   Fatigue 12/23/2015   Chronic anemia 08/15/2015   Uncontrolled type 2 diabetes mellitus with hyperglycemia, with long-term current use of insulin  (HCC) 11/05/2010    Osteoarthritis 11/11/2008     Problem List Items Addressed This Visit       Genitourinary   Acute pyelonephritis - Primary   Carlos Gill has completed 7 days of meropenem  for ESBL E. coli urinary tract infection without complication and has returned to baseline with no additional symptoms.  Catheter removed on 06/07/2023 without complication.  No additional treatment needed at this time.  Follow-up with ID as needed.      Other Visit Diagnoses       Infection due to ESBL-producing Escherichia coli            I am having Norberta Beans maintain his aspirin  EC, insulin  glargine, insulin  aspart, omeprazole, tamsulosin , PreserVision AREDS 2, acetaminophen , calcitRIOL , ezetimibe , metoprolol  tartrate, FreeStyle Libre 2 Sensor, gabapentin , hydrALAZINE , and furosemide .   Follow-up: As needed   Marlan Silva, MSN, FNP-C Nurse Practitioner [facility] Main number: [phone]

## 2023-06-08 NOTE — Patient Instructions (Signed)
 Nice to see you.  No additional treatment needed.   Follow up with ID as needed.   Have a great day and stay safe!

## 2023-06-08 NOTE — Assessment & Plan Note (Signed)
 Carlos Gill has completed 7 days of meropenem  for ESBL E. coli urinary tract infection without complication and has returned to baseline with no additional symptoms.  Catheter removed on 06/07/2023 without complication.  No additional treatment needed at this time.  Follow-up with ID as needed.

## 2023-06-24 ENCOUNTER — Encounter (HOSPITAL_COMMUNITY): Payer: Self-pay

## 2023-06-24 ENCOUNTER — Emergency Department (HOSPITAL_COMMUNITY)

## 2023-06-24 ENCOUNTER — Other Ambulatory Visit: Payer: Self-pay

## 2023-06-24 ENCOUNTER — Inpatient Hospital Stay (HOSPITAL_COMMUNITY)
Admission: EM | Admit: 2023-06-24 | Discharge: 2023-07-01 | DRG: 871 | Disposition: A | Discharge status: Home / Self Care | Attending: Internal Medicine | Admitting: Internal Medicine

## 2023-06-24 DIAGNOSIS — G4733 Obstructive sleep apnea (adult) (pediatric): Secondary | ICD-10-CM | POA: Diagnosis present

## 2023-06-24 DIAGNOSIS — Z87440 Personal history of urinary (tract) infections: Secondary | ICD-10-CM

## 2023-06-24 DIAGNOSIS — E785 Hyperlipidemia, unspecified: Secondary | ICD-10-CM | POA: Diagnosis present

## 2023-06-24 DIAGNOSIS — A4151 Sepsis due to Escherichia coli [E. coli]: Secondary | ICD-10-CM | POA: Diagnosis present

## 2023-06-24 DIAGNOSIS — Z91048 Other nonmedicinal substance allergy status: Secondary | ICD-10-CM

## 2023-06-24 DIAGNOSIS — E16A2 Hypoglycemia level 2: Secondary | ICD-10-CM | POA: Diagnosis not present

## 2023-06-24 DIAGNOSIS — K219 Gastro-esophageal reflux disease without esophagitis: Secondary | ICD-10-CM | POA: Diagnosis present

## 2023-06-24 DIAGNOSIS — Z1152 Encounter for screening for COVID-19: Secondary | ICD-10-CM | POA: Diagnosis not present

## 2023-06-24 DIAGNOSIS — Z992 Dependence on renal dialysis: Secondary | ICD-10-CM | POA: Diagnosis not present

## 2023-06-24 DIAGNOSIS — Z888 Allergy status to other drugs, medicaments and biological substances status: Secondary | ICD-10-CM

## 2023-06-24 DIAGNOSIS — N2581 Secondary hyperparathyroidism of renal origin: Secondary | ICD-10-CM | POA: Diagnosis present

## 2023-06-24 DIAGNOSIS — N400 Enlarged prostate without lower urinary tract symptoms: Secondary | ICD-10-CM | POA: Diagnosis present

## 2023-06-24 DIAGNOSIS — N186 End stage renal disease: Secondary | ICD-10-CM | POA: Diagnosis present

## 2023-06-24 DIAGNOSIS — Z1612 Extended spectrum beta lactamase (ESBL) resistance: Secondary | ICD-10-CM | POA: Diagnosis present

## 2023-06-24 DIAGNOSIS — I700 Atherosclerosis of aorta: Secondary | ICD-10-CM | POA: Diagnosis present

## 2023-06-24 DIAGNOSIS — E876 Hypokalemia: Secondary | ICD-10-CM | POA: Diagnosis present

## 2023-06-24 DIAGNOSIS — A499 Bacterial infection, unspecified: Secondary | ICD-10-CM | POA: Diagnosis present

## 2023-06-24 DIAGNOSIS — A419 Sepsis, unspecified organism: Principal | ICD-10-CM | POA: Diagnosis present

## 2023-06-24 DIAGNOSIS — J324 Chronic pansinusitis: Secondary | ICD-10-CM | POA: Diagnosis present

## 2023-06-24 DIAGNOSIS — Z833 Family history of diabetes mellitus: Secondary | ICD-10-CM

## 2023-06-24 DIAGNOSIS — A4150 Gram-negative sepsis, unspecified: Secondary | ICD-10-CM | POA: Diagnosis not present

## 2023-06-24 DIAGNOSIS — E1122 Type 2 diabetes mellitus with diabetic chronic kidney disease: Secondary | ICD-10-CM | POA: Diagnosis present

## 2023-06-24 DIAGNOSIS — R7881 Bacteremia: Secondary | ICD-10-CM | POA: Diagnosis not present

## 2023-06-24 DIAGNOSIS — J310 Chronic rhinitis: Secondary | ICD-10-CM | POA: Diagnosis present

## 2023-06-24 DIAGNOSIS — Z794 Long term (current) use of insulin: Secondary | ICD-10-CM | POA: Diagnosis not present

## 2023-06-24 DIAGNOSIS — Z79899 Other long term (current) drug therapy: Secondary | ICD-10-CM

## 2023-06-24 DIAGNOSIS — I5032 Chronic diastolic (congestive) heart failure: Secondary | ICD-10-CM | POA: Diagnosis present

## 2023-06-24 DIAGNOSIS — D631 Anemia in chronic kidney disease: Secondary | ICD-10-CM | POA: Diagnosis present

## 2023-06-24 DIAGNOSIS — Z9841 Cataract extraction status, right eye: Secondary | ICD-10-CM

## 2023-06-24 DIAGNOSIS — Z87891 Personal history of nicotine dependence: Secondary | ICD-10-CM

## 2023-06-24 DIAGNOSIS — Z9842 Cataract extraction status, left eye: Secondary | ICD-10-CM

## 2023-06-24 DIAGNOSIS — B9620 Unspecified Escherichia coli [E. coli] as the cause of diseases classified elsewhere: Secondary | ICD-10-CM | POA: Diagnosis not present

## 2023-06-24 DIAGNOSIS — N390 Urinary tract infection, site not specified: Secondary | ICD-10-CM | POA: Diagnosis not present

## 2023-06-24 DIAGNOSIS — I132 Hypertensive heart and chronic kidney disease with heart failure and with stage 5 chronic kidney disease, or end stage renal disease: Secondary | ICD-10-CM | POA: Diagnosis present

## 2023-06-24 DIAGNOSIS — Z7982 Long term (current) use of aspirin: Secondary | ICD-10-CM

## 2023-06-24 DIAGNOSIS — H353190 Nonexudative age-related macular degeneration, unspecified eye, stage unspecified: Secondary | ICD-10-CM | POA: Diagnosis present

## 2023-06-24 DIAGNOSIS — E11649 Type 2 diabetes mellitus with hypoglycemia without coma: Secondary | ICD-10-CM | POA: Diagnosis not present

## 2023-06-24 DIAGNOSIS — Z961 Presence of intraocular lens: Secondary | ICD-10-CM | POA: Diagnosis present

## 2023-06-24 DIAGNOSIS — Z860101 Personal history of adenomatous and serrated colon polyps: Secondary | ICD-10-CM

## 2023-06-24 DIAGNOSIS — E1165 Type 2 diabetes mellitus with hyperglycemia: Secondary | ICD-10-CM | POA: Diagnosis present

## 2023-06-24 LAB — CBC WITH DIFFERENTIAL/PLATELET
Abs Immature Granulocytes: 0.02 10*3/uL (ref 0.00–0.07)
Basophils Absolute: 0 10*3/uL (ref 0.0–0.1)
Basophils Relative: 0 %
Eosinophils Absolute: 0.1 10*3/uL (ref 0.0–0.5)
Eosinophils Relative: 2 %
HCT: 33.8 % — ABNORMAL LOW (ref 39.0–52.0)
Hemoglobin: 10.9 g/dL — ABNORMAL LOW (ref 13.0–17.0)
Immature Granulocytes: 0 %
Lymphocytes Relative: 11 %
Lymphs Abs: 0.7 10*3/uL (ref 0.7–4.0)
MCH: 26.9 pg (ref 26.0–34.0)
MCHC: 32.2 g/dL (ref 30.0–36.0)
MCV: 83.5 fL (ref 80.0–100.0)
Monocytes Absolute: 0.7 10*3/uL (ref 0.1–1.0)
Monocytes Relative: 10 %
Neutro Abs: 5.5 10*3/uL (ref 1.7–7.7)
Neutrophils Relative %: 77 %
Platelets: 161 10*3/uL (ref 150–400)
RBC: 4.05 MIL/uL — ABNORMAL LOW (ref 4.22–5.81)
RDW: 15.8 % — ABNORMAL HIGH (ref 11.5–15.5)
WBC: 7.1 10*3/uL (ref 4.0–10.5)
nRBC: 0 % (ref 0.0–0.2)

## 2023-06-24 LAB — URINALYSIS, W/ REFLEX TO CULTURE (INFECTION SUSPECTED)
Bilirubin Urine: NEGATIVE
Glucose, UA: >=500 mg/dL — AB
Ketones, ur: NEGATIVE mg/dL
Nitrite: NEGATIVE
Protein, ur: 100 mg/dL — AB
Specific Gravity, Urine: 1.009 (ref 1.005–1.030)
WBC, UA: >50 WBC/hpf (ref 0–5)
pH: 5 (ref 5.0–8.0)

## 2023-06-24 LAB — I-STAT CG4 LACTIC ACID, ED
Lactic Acid, Venous: 1.6 mmol/L (ref 0.5–1.9)
Lactic Acid, Venous: 1 mmol/L (ref 0.5–1.9)

## 2023-06-24 LAB — BASIC METABOLIC PANEL WITH GFR
Anion gap: 14 (ref 5–15)
BUN: 50 mg/dL — ABNORMAL HIGH (ref 8–23)
CO2: 22 mmol/L (ref 22–32)
Calcium: 8.6 mg/dL — ABNORMAL LOW (ref 8.9–10.3)
Chloride: 97 mmol/L — ABNORMAL LOW (ref 98–111)
Creatinine, Ser: 4.49 mg/dL — ABNORMAL HIGH (ref 0.61–1.24)
GFR, Estimated: 12 mL/min — ABNORMAL LOW (ref >60)
Glucose, Bld: 429 mg/dL — ABNORMAL HIGH (ref 70–99)
Potassium: 3.3 mmol/L — ABNORMAL LOW (ref 3.5–5.1)
Sodium: 133 mmol/L — ABNORMAL LOW (ref 135–145)

## 2023-06-24 MED ORDER — ACETAMINOPHEN 325 MG PO TABS
650.0000 mg | ORAL_TABLET | Freq: Once | ORAL | Status: AC
  Administered 2023-06-25: 650 mg via ORAL
  Filled 2023-06-24: qty 2

## 2023-06-24 MED ORDER — SODIUM CHLORIDE 0.9 % IV SOLN: 2.0000 g | INTRAVENOUS | Status: DC

## 2023-06-24 MED ORDER — VANCOMYCIN HCL 2000 MG/400ML IV SOLN
2000.0000 mg | Freq: Once | INTRAVENOUS | Status: AC
  Administered 2023-06-25: 2000 mg via INTRAVENOUS
  Filled 2023-06-24 (×2): qty 400

## 2023-06-24 NOTE — ED Provider Notes (Signed)
 [facility] Provider Note  History  Chief Complaint:  Chills  The history is provided by the patient.     Carlos Gill is a 83 y.o. male with a history of CKD on peritoneal dialysis, diabetes, hypertension hyperlipidemia who presents the emergency department for chills.  He reports that he had a kidney infection earlier this month.  He states he has been taking antibiotics however he feels that he has not gotten any better.  He reports that he feels that he has a bladder infection over the past 3 days.  States that he was febrile at home up to 102.  He denies any cough, congestion, chest pain, shortness of breath, nausea, vomiting, dysuria or hematuria.  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 Last Assessment & Plan:  Formatting of this note is different from the original.  - We will continue Flomax .   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    Obesity 04/11/2017   Osteoarthritis 11/11/2008   Other chest pain 04/21/2021   Proteinuria 01/21/2021   Rhinitis, chronic 03/09/2021   Shortness of breath 04/21/2021   Sleep apnea    cpap   Trochanteric bursitis of right hip 04/27/2017   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    Past Surgical History:  Procedure Laterality Date   ACHILLES TENDON REPAIR Left 2014   AV FISTULA PLACEMENT Left 08/16/2022   Procedure: LEFT ARM  BRACHIOCEPHALIC ARTERIOVENOUS (AV) FISTULA CREATION;  Surgeon: Carlene Che, MD;  Location: [facility] OR;  Service: Vascular;  Laterality: Left;  with regional block   CATARACT EXTRACTION W/ INTRAOCULAR LENS  IMPLANT, BILATERAL  2011   IR FLUORO GUIDE CV LINE RIGHT  05/30/2023   IR REMOVAL TUN CV CATH W/O FL  06/06/2023   IR US  GUIDE VASC ACCESS RIGHT  05/30/2023    Family History  Problem Relation Age of Onset   Diabetes Mother    Prostate cancer Maternal Grandfather    Colon cancer Neg Hx     Social History   Tobacco Use   Smoking status: Former    Current packs/day: 0.00    Types: Cigarettes    Quit date: 02/01/1990    Years since quitting: 33.4   Smokeless tobacco: Never  Vaping Use   Vaping status: Never Used  Substance Use Topics   Alcohol  use: No   Drug use: No    Review of Systems  Review of Systems    Reviewed and documented in HPI if pertinent.   Physical Exam   ED Triage Vitals  Encounter  Vitals Group     BP 06/24/23 1748 (!) 169/100     Systolic BP Percentile --      Diastolic BP Percentile --      Pulse Rate 06/24/23 1748 95     Resp 06/24/23 1748 (!) 24     Temp 06/24/23 1748 100 F (37.8 C)     Temp src --      SpO2 06/24/23 1748 97 %     Weight 06/24/23 1823 217 lb (98.4 kg)     Height 06/24/23 1823 6' (1.829 m)     Head Circumference --      Peak Flow --      Pain Score 06/24/23 1822 0     Pain Loc --      Pain Education --      Exclude from Growth Chart --      Physical Exam Vitals  and nursing note reviewed.  Constitutional:      General: He is not in acute distress.    Appearance: He is well-developed.  HENT:     Head: Normocephalic and atraumatic.  Eyes:     Conjunctiva/sclera: Conjunctivae normal.  Cardiovascular:     Rate and Rhythm: Normal rate and regular rhythm.     Heart sounds: No murmur heard. Pulmonary:     Effort: Pulmonary effort is normal. No respiratory distress.     Breath sounds: Normal breath sounds.  Abdominal:     Palpations: Abdomen is soft.     Tenderness: There is no abdominal tenderness. There is no right CVA tenderness or left CVA tenderness.  Musculoskeletal:        General: No swelling.     Cervical back: Neck supple.     Right lower leg: No edema.     Left lower leg: No edema.  Skin:    General: Skin is warm and dry.     Capillary Refill: Capillary refill takes less than 2 seconds.  Neurological:     Mental Status: He is alert.  Psychiatric:        Mood and Affect: Mood normal.      Procedures   Procedures  ED Course - Medical Decision Making  Brief Overview Carlos Gill is a 83 y.o. male who presents as per above.  I have reviewed the nursing documentation for past medical history, family history, and social history and agree.  I have reviewed the patient's vital signs.  Febrile, tachycardic, tachypnea.  Initial Differential Diagnoses: I am primarily concerned for sepsis, electrolyte abnormalities, UTI, SBP.  Therapies: These medications and interventions were provided for the patient while in the ED.  Medications  vancomycin (VANCOREADY) IVPB 2000 mg/400 mL (has no administration in time range)  cefTRIAXone  (ROCEPHIN ) 2 g in sodium chloride  0.9 % 100 mL IVPB (has no administration in time range)  acetaminophen  (TYLENOL ) tablet 650 mg (650 mg Oral Given 06/25/23 0006)    Testing Results: On my interpretation labs are significant for : No leukocytosis Hemoglobin 10.9 UA with infection  On my interpretation  imaging is significant for: CT abdomen pelvis reveals no evidence of acute abnormality Chest x-ray without pneumonia  See the EMR for full details regarding lab and imaging results.   Medical Decision Making 83 year old male who presents the emergency department for chills.  Patient was concerned about a UTI.  He reports that he went to his primary care doctor today and was tested which was concerning for UTI.  On exam patient does have some  lower back pain however nonmidline.  He is febrile and tachycardic.  Laboratory studies reveal no leukocytosis, hemoglobin of 10.9 and UA that is concerning for infection.  CT of the abdomen pelvis was performed which has no evidence of acute abnormality.  Chest x-ray was without pneumonia.  I am concerned that patient may have an infection related to his peritoneal dialysis catheter.  However he does have a UTI on exam I do feel that patient will require sampling from the PD catheter team.  I did consult nephrology and they will arrange for that tonight.  Patient does not have a lactic acidosis therefore we did hold on fluids given that patient is on peritoneal dialysis and appears euvolemic.  Pending these cultures and cell counts I do feel the patient requires admission to the hospital.  Patient was given broad-spectrum antibiotics to cover SBP.  I did discuss with hospital medicine and they will admit the patient to their service.  Problems Addressed: Sepsis without acute organ dysfunction, due to unspecified organism [facility]): acute illness or injury that poses a threat to life or bodily functions  Amount and/or Complexity of Data Reviewed Radiology: ordered.  Risk OTC drugs. Prescription drug management. Decision regarding hospitalization.     ### All radiography studies, electrocardiograms, and laboratory data were personally reviewed by me and incorporated into my medical decision making. Impression   1. Sepsis without acute organ  dysfunction, due to unspecified organism [facility])      Note: Dragon medical dictation software was used in the creation of this note.     Arminda Landmark, MD 06/25/23 0024    Tegeler, Marine Sia, MD 06/27/23 [phone]

## 2023-06-24 NOTE — ED Notes (Signed)
 Patient transported to CT

## 2023-06-24 NOTE — ED Provider Triage Note (Signed)
 Emergency Medicine Provider Triage Evaluation Note  Carlos Gill , a 83 y.o. male  was evaluated in triage.  Pt complains of bladder infection for the past 3 days.  Patient states he had a kidney infection earlier this month and take antibiotics however states he has not been getting any better.  Patient states had a fever 102 degrees at home.  Patient denies any cough congestion chest pain shortness of breath or dysuria or hematuria but states that he still feels he has a urinary tract infection.  Review of Systems  Positive:  Negative:   Physical Exam  BP (!) 169/100 (BP Location: Right Arm)   Pulse 95   Temp 100 F (37.8 C)   Resp (!) 24   SpO2 97%  Gen:   Awake, no distress   Resp:  Normal effort  MSK:   Moves extremities without difficulty  Other:  No CVA tenderness or abdominal tenderness or peritoneal signs on exam  Medical Decision Making  Medically screening exam initiated at 6:17 PM.  Appropriate orders placed.  Carlos Gill was informed that the remainder of the evaluation will be completed by another provider, this initial triage assessment does not replace that evaluation, and the importance of remaining in the ED until their evaluation is complete.  Workup initiated, patient stable   Carlos Finn, PA-C 06/24/23 1818

## 2023-06-24 NOTE — ED Triage Notes (Signed)
 Pt reports he was told he has a bladder infection today and he reports chills. Denies any pain.

## 2023-06-25 DIAGNOSIS — A419 Sepsis, unspecified organism: Secondary | ICD-10-CM

## 2023-06-25 DIAGNOSIS — N390 Urinary tract infection, site not specified: Secondary | ICD-10-CM | POA: Diagnosis not present

## 2023-06-25 LAB — BLOOD CULTURE ID PANEL (REFLEXED) - BCID2

## 2023-06-25 LAB — CBC WITH DIFFERENTIAL/PLATELET
Abs Immature Granulocytes: 0.1 10*3/uL — ABNORMAL HIGH (ref 0.00–0.07)
Basophils Absolute: 0 10*3/uL (ref 0.0–0.1)
Basophils Relative: 0 %
Eosinophils Absolute: 0 10*3/uL (ref 0.0–0.5)
Eosinophils Relative: 0 %
HCT: 30 % — ABNORMAL LOW (ref 39.0–52.0)
Hemoglobin: 9.8 g/dL — ABNORMAL LOW (ref 13.0–17.0)
Immature Granulocytes: 1 %
Lymphocytes Relative: 6 %
Lymphs Abs: 0.5 10*3/uL — ABNORMAL LOW (ref 0.7–4.0)
MCH: 27.2 pg (ref 26.0–34.0)
MCHC: 32.7 g/dL (ref 30.0–36.0)
MCV: 83.3 fL (ref 80.0–100.0)
Monocytes Absolute: 0.8 10*3/uL (ref 0.1–1.0)
Monocytes Relative: 10 %
Neutro Abs: 6.9 10*3/uL (ref 1.7–7.7)
Neutrophils Relative %: 83 %
Platelets: 134 10*3/uL — ABNORMAL LOW (ref 150–400)
RBC: 3.6 MIL/uL — ABNORMAL LOW (ref 4.22–5.81)
RDW: 15.7 % — ABNORMAL HIGH (ref 11.5–15.5)
WBC: 8.4 10*3/uL (ref 4.0–10.5)
nRBC: 0.5 % — ABNORMAL HIGH (ref 0.0–0.2)

## 2023-06-25 LAB — BODY FLUID CELL COUNT WITH DIFFERENTIAL
Eos, Fluid: 1 %
Lymphs, Fluid: 56 %
Monocyte-Macrophage-Serous Fluid: 31 % — ABNORMAL LOW (ref 50–90)
Neutrophil Count, Fluid: 12 % (ref 0–25)
Total Nucleated Cell Count, Fluid: 14 uL (ref 0–1000)

## 2023-06-25 LAB — RENAL FUNCTION PANEL
Albumin: 2.7 g/dL — ABNORMAL LOW (ref 3.5–5.0)
Anion gap: 13 (ref 5–15)
BUN: 54 mg/dL — ABNORMAL HIGH (ref 8–23)
CO2: 20 mmol/L — ABNORMAL LOW (ref 22–32)
Calcium: 8.2 mg/dL — ABNORMAL LOW (ref 8.9–10.3)
Chloride: 103 mmol/L (ref 98–111)
Creatinine, Ser: 4.38 mg/dL — ABNORMAL HIGH (ref 0.61–1.24)
GFR, Estimated: 13 mL/min — ABNORMAL LOW (ref >60)
Glucose, Bld: 199 mg/dL — ABNORMAL HIGH (ref 70–99)
Phosphorus: 3.6 mg/dL (ref 2.5–4.6)
Potassium: 2.8 mmol/L — ABNORMAL LOW (ref 3.5–5.1)
Sodium: 136 mmol/L (ref 135–145)

## 2023-06-25 LAB — RESP PANEL BY RT-PCR (RSV, FLU A&B, COVID)  RVPGX2
Influenza A by PCR: NEGATIVE
Influenza B by PCR: NEGATIVE
Resp Syncytial Virus by PCR: NEGATIVE
SARS Coronavirus 2 by RT PCR: NEGATIVE

## 2023-06-25 LAB — GLUCOSE, CAPILLARY
Glucose-Capillary: 309 mg/dL — ABNORMAL HIGH (ref 70–99)
Glucose-Capillary: 110 mg/dL — ABNORMAL HIGH (ref 70–99)
Glucose-Capillary: 239 mg/dL — ABNORMAL HIGH (ref 70–99)

## 2023-06-25 LAB — MAGNESIUM: Magnesium: 1.5 mg/dL — ABNORMAL LOW (ref 1.7–2.4)

## 2023-06-25 MED ORDER — GENTAMICIN SULFATE 0.1 % EX CREA
1.0000 | TOPICAL_CREAM | Freq: Every day | CUTANEOUS | Status: DC
  Administered 2023-06-25 – 2023-07-01 (×5): 1 via TOPICAL
  Filled 2023-06-25: qty 15

## 2023-06-25 MED ORDER — ACETAMINOPHEN 500 MG PO TABS: 1000.0000 mg | ORAL_TABLET | Freq: Four times a day (QID) | ORAL | Status: DC | PRN

## 2023-06-25 MED ORDER — HYDRALAZINE HCL 50 MG PO TABS
100.0000 mg | ORAL_TABLET | Freq: Three times a day (TID) | ORAL | Status: DC
  Administered 2023-06-25 – 2023-07-01 (×18): 100 mg via ORAL
  Filled 2023-06-25 (×18): qty 2

## 2023-06-25 MED ORDER — CALCITRIOL 0.5 MCG PO CAPS
1.0000 ug | ORAL_CAPSULE | Freq: Every day | ORAL | Status: DC
  Administered 2023-06-25 – 2023-07-01 (×7): 1 ug via ORAL
  Filled 2023-06-25 (×7): qty 2

## 2023-06-25 MED ORDER — TAMSULOSIN HCL 0.4 MG PO CAPS
0.4000 mg | ORAL_CAPSULE | Freq: Every day | ORAL | Status: DC
  Administered 2023-06-25 – 2023-06-30 (×7): 0.4 mg via ORAL
  Filled 2023-06-25 (×7): qty 1

## 2023-06-25 MED ORDER — HEPARIN SODIUM (PORCINE) 5000 UNIT/ML IJ SOLN
5000.0000 [IU] | Freq: Three times a day (TID) | INTRAMUSCULAR | Status: DC
  Administered 2023-06-25 – 2023-07-01 (×19): 5000 [IU] via SUBCUTANEOUS
  Filled 2023-06-25 (×19): qty 1

## 2023-06-25 MED ORDER — METOPROLOL TARTRATE 50 MG PO TABS
100.0000 mg | ORAL_TABLET | Freq: Every day | ORAL | Status: DC
  Administered 2023-06-25 – 2023-07-01 (×7): 100 mg via ORAL
  Filled 2023-06-25 (×7): qty 2

## 2023-06-25 MED ORDER — ACETAMINOPHEN 500 MG PO TABS
1000.0000 mg | ORAL_TABLET | Freq: Four times a day (QID) | ORAL | Status: DC | PRN
  Administered 2023-06-25 – 2023-06-30 (×4): 1000 mg via ORAL
  Filled 2023-06-25 (×5): qty 2

## 2023-06-25 MED ORDER — SODIUM CHLORIDE 0.9 % IV SOLN
500.0000 mg | INTRAVENOUS | Status: AC
Start: 2023-06-25 — End: 2023-07-02
  Administered 2023-06-25 – 2023-07-01 (×7): 500 mg via INTRAVENOUS
  Filled 2023-06-25 (×8): qty 10

## 2023-06-25 MED ORDER — POLYETHYLENE GLYCOL 3350 17 G PO PACK: 17.0000 g | PACK | Freq: Every day | ORAL | Status: DC | PRN

## 2023-06-25 MED ORDER — METOPROLOL TARTRATE 50 MG PO TABS
75.0000 mg | ORAL_TABLET | Freq: Every day | ORAL | Status: DC
  Administered 2023-06-25 – 2023-06-30 (×7): 75 mg via ORAL
  Filled 2023-06-25 (×7): qty 1

## 2023-06-25 MED ORDER — MELATONIN 3 MG PO TABS: 6.0000 mg | ORAL_TABLET | Freq: Every evening | ORAL | Status: DC | PRN

## 2023-06-25 MED ORDER — EZETIMIBE 10 MG PO TABS
10.0000 mg | ORAL_TABLET | Freq: Every day | ORAL | Status: DC
  Administered 2023-06-25 – 2023-07-01 (×7): 10 mg via ORAL
  Filled 2023-06-25 (×7): qty 1

## 2023-06-25 MED ORDER — PANTOPRAZOLE SODIUM 40 MG PO TBEC
40.0000 mg | DELAYED_RELEASE_TABLET | Freq: Every day | ORAL | Status: DC
  Administered 2023-06-25 – 2023-07-01 (×7): 40 mg via ORAL
  Filled 2023-06-25 (×7): qty 1

## 2023-06-25 MED ORDER — GABAPENTIN 100 MG PO CAPS
100.0000 mg | ORAL_CAPSULE | Freq: Every day | ORAL | Status: DC
  Administered 2023-06-25 – 2023-07-01 (×7): 100 mg via ORAL
  Filled 2023-06-25 (×7): qty 1

## 2023-06-25 MED ORDER — FUROSEMIDE 40 MG PO TABS
40.0000 mg | ORAL_TABLET | Freq: Two times a day (BID) | ORAL | Status: DC
  Administered 2023-06-26: 40 mg via ORAL
  Filled 2023-06-25: qty 1

## 2023-06-25 MED ORDER — ALBUTEROL SULFATE (2.5 MG/3ML) 0.083% IN NEBU: 2.5000 mg | INHALATION_SOLUTION | RESPIRATORY_TRACT | Status: DC | PRN

## 2023-06-25 MED ORDER — POTASSIUM CHLORIDE CRYS ER 20 MEQ PO TBCR
40.0000 meq | EXTENDED_RELEASE_TABLET | Freq: Once | ORAL | Status: AC
  Administered 2023-06-25: 40 meq via ORAL
  Filled 2023-06-25: qty 2

## 2023-06-25 MED ORDER — INSULIN GLARGINE-YFGN 100 UNIT/ML ~~LOC~~ SOLN
40.0000 [IU] | Freq: Two times a day (BID) | SUBCUTANEOUS | Status: DC
  Administered 2023-06-25 – 2023-06-26 (×5): 40 [IU] via SUBCUTANEOUS
  Filled 2023-06-25 (×7): qty 0.4

## 2023-06-25 MED ORDER — GABAPENTIN 100 MG PO CAPS
200.0000 mg | ORAL_CAPSULE | Freq: Every day | ORAL | Status: DC
  Administered 2023-06-25 – 2023-06-30 (×7): 200 mg via ORAL
  Filled 2023-06-25 (×7): qty 2

## 2023-06-25 MED ORDER — ASPIRIN 81 MG PO TBEC
81.0000 mg | DELAYED_RELEASE_TABLET | Freq: Every morning | ORAL | Status: DC
Start: 2023-06-25 — End: 2023-07-01
  Administered 2023-06-25 – 2023-07-01 (×7): 81 mg via ORAL
  Filled 2023-06-25 (×7): qty 1

## 2023-06-25 MED ORDER — DELFLEX-LC/1.5% DEXTROSE 344 MOSM/L IP SOLN: INTRAPERITONEAL | Status: DC

## 2023-06-25 MED ORDER — SODIUM CHLORIDE 0.9% FLUSH
3.0000 mL | Freq: Two times a day (BID) | INTRAVENOUS | Status: DC
  Administered 2023-06-25 – 2023-07-01 (×14): 3 mL via INTRAVENOUS

## 2023-06-25 NOTE — Plan of Care (Signed)
   Problem: Education: Goal: Knowledge of General Education information will improve Description Including pain rating scale, medication(s)/side effects and non-pharmacologic comfort measures Outcome: Progressing   Problem: Clinical Measurements: Goal: Ability to maintain clinical measurements within normal limits will improve Outcome: Progressing   Problem: Clinical Measurements: Goal: Will remain free from infection Outcome: Progressing

## 2023-06-25 NOTE — Progress Notes (Signed)
   06/25/23 1745  Peritoneal Catheter Mid lower abdomen  No placement date or time found.   Catheter Location: Mid lower abdomen  Site Assessment Clean, Dry, Intact  Drainage Description None  Catheter status Accessed  Dressing Gauze/Drain sponge  Dressing Status Clean, Dry, Intact  Dressing Intervention New dressing/dressing changed (Gentamycin apllied)  Cycler Setup  Total Number of Night Cycles 4  Night Fill Volume 2 (L)  Dianeal Solution Dextrose  1.5% in 6000 mL Low Cal/Low Mag  Night Dwell Time per Cycle - Hour(s) 1  Night Dwell Time per Cycle - Minute(s) 30  Night Time Therapy - Minute(s) 56  Night Time Therapy - Hour(s) 7  Minimum Initial Drain Volume 0  Maximum Peritoneal Volume 3000 (mL)  Night/Total Therapy Volume 8000 (mL)  Day Exchange No  Completion  Treatment Status Started  Hand-off documentation  Hand-off Given Given to Transfer Unit/facility  Report given to (Full Name) Santa Cuba, LPN (Informed her of [facility] 800 number on cart)

## 2023-06-25 NOTE — H&P (Addendum)
 History and Physical    Carlos Gill MVH:[id number] DOB: 1940/11/01 DOA: 06/24/2023  PCP: Suan Elm, MD   Patient coming from: Home   Chief Complaint:  Chief Complaint  Patient presents with   Chills    HPI:  Carlos Gill is a 83 y.o. male with hx of hypertension, hyperlipidemia, OSA on CPAP, insulin -dependent diabetes mellitus, and ESRD on peritoneal dialysis, with recent admission 4/24-28 treated for ESBL E. coli UTI with course of meropenem  EOT 5/4. He returns with 2 days of subjective fever and chills and concern for ongoing urinary infection.  Reports lower back pain.  Denies any urinary changes including dysuria, frequency, urgency.  No abdominal pain.  No other localizing infectious symptoms.   Review of Systems:  ROS complete and negative except as marked above   Allergies  Allergen Reactions   Statins Swelling and Other (See Comments)    Muscle aches   Tape Rash    Pt has rash on L arm from tape "used with stitches"    Wound Dressing Adhesive Dermatitis    Pt has rash on L arm from tape "used with stitches"    Prior to Admission medications   Medication Sig Start Date End Date Taking? Authorizing Provider  acetaminophen  (TYLENOL ) 500 MG tablet Take 1,000 mg by mouth daily as needed for moderate pain.    [provider]  aspirin  EC 81 MG tablet Take 81 mg by mouth every morning.    [provider]  calcitRIOL  (ROCALTROL ) 0.5 MCG capsule Take 0.5 mcg by mouth 2 (two) times daily. 02/13/21   [provider]  Continuous Glucose Sensor (FREESTYLE LIBRE 2 SENSOR) MISC as directed topically change every 14 days to monitor blood glucose continuously 06/24/21   [provider]  ezetimibe  (ZETIA ) 10 MG tablet Take 10 mg by mouth daily.    [provider]  furosemide  (LASIX ) 40 MG tablet Take 40 mg by mouth 2 (two) times daily. 01/19/21   [provider]  gabapentin  (NEURONTIN ) 100 MG capsule Take 100-200 mg by mouth See  admin instructions. Take 100 mg in the morning and 200 mg at bedtime 02/08/19   [provider]  hydrALAZINE  (APRESOLINE ) 100 MG tablet Take 100 mg by mouth 3 (three) times daily. 08/19/22   [provider]  insulin  aspart (NOVOLOG ) 100 UNIT/ML injection Inject 6-18 Units into the skin 3 (three) times daily with meals. Per sliding scale    [provider]  insulin  glargine (LANTUS ) 100 units/mL SOLN Inject 40 Units into the skin 2 (two) times daily.    [provider]  metoprolol  tartrate (LOPRESSOR ) 50 MG tablet Take 75-100 mg by mouth See admin instructions. Take 100 mg in the morning and 75 mg at bedtime    [provider]  Multiple Vitamins-Minerals (PRESERVISION AREDS 2) CAPS Take 1 capsule by mouth 2 (two) times daily.    [provider]  omeprazole (PRILOSEC) 20 MG capsule Take 20 mg by mouth daily.    [provider]  tamsulosin  (FLOMAX ) 0.4 MG CAPS capsule Take 0.4 mg by mouth at bedtime.    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 Last Assessment & Plan:  Formatting of this note is different from the original.  - We will continue Flomax .   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   Diabetes mellitus without complication (HCC)    Enlarged prostate 04/21/2021   Epididymitis  11/09/2021   Essential hypertension 11/02/2021   Fatigue 12/23/2015   GERD without esophagitis 11/02/2021   Hyperglycemia due to diabetes mellitus (HCC) 11/09/2021   Hyperlipidemia    Hypertension    Macular degeneration 04/21/2021   Nonexudative age-related macular degeneration 04/19/2018   Obesity 04/11/2017   Osteoarthritis 11/11/2008   Other chest pain 04/21/2021   Proteinuria 01/21/2021   Rhinitis, chronic 03/09/2021   Shortness of breath 04/21/2021   Sleep apnea    cpap   Trochanteric bursitis of right hip 04/27/2017   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 His smoking use included cigarettes. He has never used smokeless tobacco. He reports that he does not drink alcohol  and does not use drugs.  Family History  Problem Relation Age of Onset   Diabetes Mother    Prostate cancer Maternal Grandfather    Colon cancer Neg Hx      Physical Exam: Vitals:   06/24/23 2348 06/25/23 0119 06/25/23 0243 06/25/23 0435  BP:  (!) 167/68 (!) 151/61 (!) 153/64  Pulse:  (!) 101 79 76  Resp:  20 19 18   Temp: (!) 101.3 F (38.5 C) (!) 103 F (39.4 C) 100 F (37.8 C) 99.6 F (37.6 C)  TempSrc: Oral  Oral   SpO2:  99% 94% 97%  Weight:      Height:        Gen: Awake, alert, chronically ill-appearing, elderly CV: Regular, normal S1, S2, no murmurs  Resp: Normal WOB, CTAB  Abd: Flat, normoactive,  nontender.  PD cath in the right lower quadrant with dressing in place is clean and dry, nontender MSK: No CVAT.  LE symmetric, 1+ edema near the ankles Skin: No rashes or lesions to exposed skin  Neuro: Alert and interactive  Psych: euthymic, appropriate    Data review:   Labs reviewed, notable for:   Lactate 1.6 -> 1 Hyperglycemic 429 K3.3 History ESRD WBC 7 UA consistent with UTI  Micro:  Results for orders placed or performed during the hospital encounter of 06/24/23  Resp panel by RT-PCR (RSV, Flu A&B, Covid) Anterior Nasal Swab     Status: None   Collection Time: 06/25/23 12:09 AM   Specimen: Anterior Nasal Swab  Result Value Ref Range Status   SARS Coronavirus 2 by RT PCR NEGATIVE NEGATIVE Final   Influenza A  by PCR NEGATIVE NEGATIVE Final   Influenza B by PCR NEGATIVE NEGATIVE Final    Comment: (NOTE) The Xpert Xpress SARS-CoV-2/FLU/RSV plus assay is intended as an aid in the diagnosis of influenza from Nasopharyngeal swab specimens and should not be used as a sole basis for treatment. Nasal washings and aspirates are unacceptable for Xpert Xpress SARS-CoV-2/FLU/RSV testing.  Fact Sheet for Patients: BloggerCourse.com  Fact Sheet for Healthcare Providers: SeriousBroker.it  This test is not yet approved or cleared by the [place]  FDA and has been authorized for detection and/or diagnosis of SARS-CoV-2 by FDA under an Emergency Use Authorization (EUA). This EUA will remain in effect (meaning this test can be used) for the duration of the COVID-19 declaration under Section 564(b)(1) of the Act, 21 U.S.C. section 360bbb-3(b)(1), unless the authorization is terminated or revoked.     Resp Syncytial Virus by PCR NEGATIVE NEGATIVE Final    Comment: (NOTE) Fact Sheet for Patients: BloggerCourse.com  Fact Sheet for Healthcare Providers: SeriousBroker.it  This  test is not yet approved or cleared by the [place]  FDA and has been authorized for detection and/or diagnosis of SARS-CoV-2 by FDA under an Emergency Use Authorization (EUA). This EUA will remain in effect (meaning this test can be used) for the duration of the COVID-19 declaration under Section 564(b)(1) of the Act, 21 U.S.C. section 360bbb-3(b)(1), unless the authorization is terminated or revoked.  Performed at [facility], [address] [place], [place] [address]     Imaging reviewed:  CT ABDOMEN PELVIS WO CONTRAST Result Date: 06/24/2023 CLINICAL DATA:  bilateral back pain; concern for pyleo vs stone EXAM: CT ABDOMEN AND PELVIS WITHOUT CONTRAST TECHNIQUE: Multidetector CT imaging of the abdomen and pelvis was performed following the standard protocol without IV contrast. RADIATION DOSE REDUCTION: This exam was performed according to the departmental dose-optimization program which includes automated exposure control, adjustment of the mA and/or kV according to patient size and/or use of iterative reconstruction technique. COMPARISON:  CT abdomen pelvis 11/01/2021 FINDINGS: Lower chest: Trace pericardial effusion. Hepatobiliary: No focal liver abnormality. No gallstones, gallbladder wall thickening, or pericholecystic fluid. No biliary dilatation. Pancreas: No focal lesion. Normal pancreatic contour. No surrounding inflammatory changes. No main pancreatic ductal dilatation. Spleen: Normal in size without focal abnormality. Adrenals/Urinary Tract: No adrenal nodule bilaterally. Nonspecific bilateral perinephric fat stranding. No nephrolithiasis and no hydronephrosis. Fluid density lesions likely represent simple renal cysts. Simple renal cysts, in the absence of clinically indicated signs/symptoms, require no independent follow-up. No ureterolithiasis or hydroureter. The urinary bladder is unremarkable. Stomach/Bowel: Stomach is within normal limits. No evidence of bowel wall  thickening or dilatation. Appendix appears normal. Vascular/Lymphatic: No abdominal aorta or iliac aneurysm. Mild atherosclerotic plaque of the aorta and its branches. No abdominal, pelvic, or inguinal lymphadenopathy. Reproductive: Prostate is unremarkable.  Likely TURP procedure. Other: Peritoneal dialysis catheter within the pelvis. No intraperitoneal free fluid. No intraperitoneal free gas. No organized fluid collection. Musculoskeletal: Soft tissue densities along the anterior abdominal wall likely related to medication injection. No hernia. Mild subcutaneus soft tissue edema. No suspicious lytic or blastic osseous lesions. No acute displaced fracture. Coarsening of the right iliac bone trabecula stable compared to prior. IMPRESSION: 1. No acute intra-abdominal intrapelvic abnormality with limited evaluation on this noncontrast study. 2. Trace pericardial effusion. 3.  Aortic Atherosclerosis (ICD10-I70.0). Electronically Signed   By: Morgane  Naveau M.D.   On: 06/24/2023 22:43   DG Chest Portable 1 View Result Date: 06/24/2023 CLINICAL DATA:  Cough EXAM: PORTABLE  CHEST 1 VIEW COMPARISON:  05/26/2023 FINDINGS: Single frontal view of the chest demonstrates a stable enlarged cardiac silhouette. No acute airspace disease, effusion, or pneumothorax. No acute bony abnormalities. IMPRESSION: 1. No acute intrathoracic process. Electronically Signed   By: Bobbye Burrow M.D.   On: 06/24/2023 22:38   Personally reviewed CT A/P      ED Course:  Treated with vancomycin and ordered for ceftriaxone  although I have changed this to meropenem  per below.  EDP contacted nephrology to request PD catheter culture.    Assessment/Plan:  83 y.o. male with hx hypertension, hyperlipidemia, OSA on CPAP, insulin -dependent diabetes mellitus, and ESRD on peritoneal dialysis, with recent admission 4/24-28 treated for ESBL E. coli UTI with course of meropenem  EOT 5/4.   Relapsed urinary tract infection Sepsis secondary to  above, present on admission, improving History recent ESBL E. Coli UTI 2 days of nonspecific systemic symptoms with subjective fever and chills.  On initial evaluation febrile to 38.5C, tachycardic in the low 100s.  Exam benign, no CVA tenderness.  WBC 7.  Lactate 1.6 -> 1.  CT abdomen pelvis without contrast does have nonspecific perinephric stranding; no acute abnormality noted.  Suspect relapse UTI with possible ESBL species -S/p vancomycin.  Continue meropenem  pharmacy to dose -Follow-up blood cultures, urine culture, PD culture to be obtained by nephrology per ED report -Held off on large-volume IV fluid due to history ESRD and lactate is within normal limit and able to take p.o.'s, continue oral hydration.  Temporarily held Lasix  -If he has relapse ESBL infection would involve ID to help guide antibiotics and course -Contact precautions  Incidental findings:  Trace pericardial effusion  Aortic atherosclerosis   Chronic medical problems: ESRD on PD: Routine nephrology consult in the morning (not contacted overnight).  Temporary hold on Lasix  given underlying sepsis Diabetes type 2 with hyperglycemia: Continue home basal 40 units twice daily.  SSI for sensitive, at bedtime correction Hypertension: Continue home hydralazine  100 mg 3 times daily, metoprolol  100/75 mg AM/PM Hyperlipidemia: Continue home Zetia  OSA: CPAP nightly BPH continue tamsulosin   Body mass index is 29.43 kg/m.    DVT prophylaxis:  SQ Heparin  Code Status:  Full Code; note says he has unsure if he would really want resuscitation.  For now wants to default to full code but will think this over Diet:  Diet Orders (From admission, onward)     Start     Ordered   06/25/23 0038  Diet renal with fluid restriction Fluid restriction: 1500 mL Fluid; Room service appropriate? Yes; Fluid consistency: Thin  Diet effective now       Question Answer Comment  Fluid restriction: 1500 mL Fluid   Room service appropriate? Yes    Fluid consistency: Thin      06/25/23 0038           Family Communication:  none   Consults: None Admission status:   Inpatient, Telemetry bed  Severity of Illness: The appropriate patient status for this patient is INPATIENT. Inpatient status is judged to be reasonable and necessary in order to provide the required intensity of service to ensure the patient's safety. The patient's presenting symptoms, physical exam findings, and initial radiographic and laboratory data in the context of their chronic comorbidities is felt to place them at high risk for further clinical deterioration. Furthermore, it is not anticipated that the patient will be medically stable for discharge from the hospital within 2 midnights of admission.   * I certify that at the point  of admission it is my clinical judgment that the patient will require inpatient hospital care spanning beyond 2 midnights from the point of admission due to high intensity of service, high risk for further deterioration and high frequency of surveillance required.*   Arnulfo Larch, MD [facility]  How to contact the [facility] Attending or Consulting provider 7A - 7P or covering provider during after hours 7P -7A, for this patient.  Check the care team in [facility] and look for a) attending/consulting [facility] provider listed and b) the [facility] team listed Log into [email or web address] and use [facility]'s universal password to access. If you do not have the password, please contact the hospital operator. Locate the [facility] provider you are looking for under [facility] and page to a number that you can be directly reached. If you still have difficulty reaching the provider, please page the [facility] (Director on Call) for the Hospitalists listed on amion for assistance.  06/25/2023, 6:35 AM

## 2023-06-25 NOTE — Progress Notes (Signed)
 PD tx initation note:   Pre TX VS:   06/25/23 1735  Vitals  Temp 98.3 F (36.8 C)  Temp Source Oral  BP (!) 141/61  MAP (mmHg) 85  BP Location Right Arm  BP Method Automatic  Patient Position (if appropriate) Lying  ECG Heart Rate 74  Resp 20  MEWS COLOR  MEWS Score Color Green  Oxygen Therapy  SpO2 100 %  O2 Device Room Air  MEWS Score  MEWS Temp 0  MEWS Systolic 0  MEWS Pulse 0  MEWS RR 0  MEWS LOC 0  MEWS Score 0    Pre TX weight: 101.5kg   PD treatment initiated via aseptic technique. Consent signed and in chart. Patient is alert and oriented. No complaints of pain. No specimen collected. PD exit site clean, dry and intact. Gentamycin and new dressing applied. Bedside RN educated on PD machine and how to contact tech support when PD machine alarms.PD tx initation note:

## 2023-06-25 NOTE — Progress Notes (Signed)
 Brief progress note: -Patient was admitted earlier today.  As per H&P done on admission: "Carlos Gill is a 83 y.o. male with hx of hypertension, hyperlipidemia, OSA on CPAP, insulin -dependent diabetes mellitus, and ESRD on peritoneal dialysis, with recent admission 4/24-28 treated for ESBL E. coli UTI with course of meropenem  EOT 5/4. He returns with 2 days of subjective fever and chills and concern for ongoing urinary infection.  Reports lower back pain.  Denies any urinary changes including dysuria, frequency, urgency.  No abdominal pain.  No other localizing infectious symptoms".   06/25/2023: Blood culture is growing E. coli ESBL.  Continue IV antibiotics for now.  Patient is on meropenem .  Low threshold to liaise with the infectious team.  Patient has had recurrent UTI.

## 2023-06-25 NOTE — Plan of Care (Signed)
   Problem: Education: Goal: Knowledge of General Education information will improve Description Including pain rating scale, medication(s)/side effects and non-pharmacologic comfort measures Outcome: Progressing   Problem: Health Behavior/Discharge Planning: Goal: Ability to manage health-related needs will improve Outcome: Progressing

## 2023-06-25 NOTE — Consult Note (Signed)
 [facility] Renal Consultation Note  Requesting MD: Fonnie Iba, MD Indication for Consultation:  ESRD  Chief complaint: fever/chills  HPI:  Carlos Gill is a 83 y.o. male with a history of end-stage renal disease on peritoneal dialysis, hypertension, BPH, type 2 diabetes mellitus, and OSA on CPAP who presented to the hospital with fever and chills as well as flank pain.  He had a recent admission with a UTI.  Nephrology is consulted for assistance with management of peritoneal dialysis and ESRD care.  We requested a peritoneal fluid cell count and culture be sent.  He did not get PD overnight as he was in the ER.  On exam today, the patient is very sleepy.  He provides his name and falls asleep easily during the interview.  I reached out to his wife via phone and did not get her.    PMHx:   Past Medical History:  Diagnosis Date   Abnormal result of other cardiovascular function study 04/21/2021   Abnormality of plasma protein, unspecified 04/21/2021   Acute kidney failure, unspecified (HCC) 04/21/2021   Acute on chronic diastolic (congestive) heart failure (HCC) 04/21/2021   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   Adverse effect of antihyperlipidemic and antiarteriosclerotic drugs, initial encounter 12/19/2017   Benign prostatic hyperplasia with urinary obstruction 04/21/2021   Formatting of this note might be different from the original. Last Assessment & Plan:  Formatting of this note is different from the original.  - We will continue Flomax .   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   Diabetes mellitus without complication (HCC)    Enlarged prostate 04/21/2021   Epididymitis 11/09/2021   Essential hypertension 11/02/2021   Fatigue 12/23/2015   GERD without esophagitis 11/02/2021   Hyperglycemia due to diabetes mellitus (HCC) 11/09/2021    Hyperlipidemia    Hypertension    Macular degeneration 04/21/2021   Nonexudative age-related macular degeneration 04/19/2018   Obesity 04/11/2017   Osteoarthritis 11/11/2008   Other chest pain 04/21/2021   Proteinuria 01/21/2021   Rhinitis, chronic 03/09/2021   Shortness of breath 04/21/2021   Sleep apnea    cpap   Trochanteric bursitis of right hip 04/27/2017   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    Past Surgical History:  Procedure Laterality Date   ACHILLES TENDON REPAIR Left 2014   AV FISTULA PLACEMENT Left 08/16/2022   Procedure: LEFT ARM  BRACHIOCEPHALIC ARTERIOVENOUS (AV) FISTULA CREATION;  Surgeon: Carlene Che, MD;  Location: [facility] OR;  Service: Vascular;  Laterality: Left;  with regional block   CATARACT EXTRACTION W/ INTRAOCULAR LENS  IMPLANT, BILATERAL  2011   IR FLUORO GUIDE CV LINE RIGHT  05/30/2023   IR REMOVAL TUN CV CATH W/O FL  06/06/2023   IR US  GUIDE VASC ACCESS RIGHT  05/30/2023    Family Hx:  Family History  Problem Relation Age of Onset   Diabetes Mother    Prostate cancer Maternal Grandfather    Colon cancer Neg Hx     Social History:  reports that he quit smoking about 33 years ago. His smoking use included cigarettes. He has never used smokeless tobacco. He reports that he does not drink alcohol  and does not use drugs.  Allergies:  Allergies  Allergen Reactions   Statins Swelling and Other (See Comments)    Muscle aches   Tape Rash    Pt has rash  on L arm from tape "used with stitches"    Wound Dressing Adhesive Dermatitis    Pt has rash on L arm from tape "used with stitches"    Medications: Prior to Admission medications   Medication Sig Start Date End Date Taking? Authorizing Provider  acetaminophen  (TYLENOL ) 500 MG tablet Take 1,000 mg by mouth daily as needed for moderate pain.    [provider]  aspirin  EC 81 MG tablet Take 81 mg by mouth every morning.    [provider]  calcitRIOL   (ROCALTROL ) 0.5 MCG capsule Take 0.5 mcg by mouth 2 (two) times daily. 02/13/21   [provider]  Continuous Glucose Sensor (FREESTYLE LIBRE 2 SENSOR) MISC as directed topically change every 14 days to monitor blood glucose continuously 06/24/21   [provider]  ezetimibe  (ZETIA ) 10 MG tablet Take 10 mg by mouth daily.    [provider]  furosemide  (LASIX ) 40 MG tablet Take 40 mg by mouth 2 (two) times daily. 01/19/21   [provider]  gabapentin  (NEURONTIN ) 100 MG capsule Take 100-200 mg by mouth See admin instructions. Take 100 mg in the morning and 200 mg at bedtime 02/08/19   [provider]  hydrALAZINE  (APRESOLINE ) 100 MG tablet Take 100 mg by mouth 3 (three) times daily. 08/19/22   [provider]  insulin  aspart (NOVOLOG ) 100 UNIT/ML injection Inject 6-18 Units into the skin 3 (three) times daily with meals. Per sliding scale    [provider]  insulin  glargine (LANTUS ) 100 units/mL SOLN Inject 40 Units into the skin 2 (two) times daily.    [provider]  metoprolol  tartrate (LOPRESSOR ) 50 MG tablet Take 75-100 mg by mouth See admin instructions. Take 100 mg in the morning and 75 mg at bedtime    [provider]  Multiple Vitamins-Minerals (PRESERVISION AREDS 2) CAPS Take 1 capsule by mouth 2 (two) times daily.    [provider]  omeprazole (PRILOSEC) 20 MG capsule Take 20 mg by mouth daily.    [provider]  tamsulosin  (FLOMAX ) 0.4 MG CAPS capsule Take 0.4 mg by mouth at bedtime.    [provider]   I have reviewed the patient's current and reported prior to admission medications.  Labs:     Latest Ref Rng & Units 06/25/2023    7:18 AM 06/24/2023    6:18 PM 05/31/2023    4:46 AM  BMP  Glucose 70 - 99 mg/dL 161  096  045   BUN 8 - 23 mg/dL 54  50  43   Creatinine 0.61 - 1.24 mg/dL 4.09  8.11  9.14   Sodium 135 - 145 mmol/L 136  133  138   Potassium 3.5 - 5.1 mmol/L 2.8   3.3  3.5   Chloride 98 - 111 mmol/L 103  97  105   CO2 22 - 32 mmol/L 20  22  21    Calcium 8.9 - 10.3 mg/dL 8.2  8.6  8.6     Urinalysis    Component Value Date/Time   COLORURINE YELLOW 06/24/2023 2258   APPEARANCEUR CLOUDY (A) 06/24/2023 2258   LABSPEC 1.009 06/24/2023 2258   PHURINE 5.0 06/24/2023 2258   GLUCOSEU >=500 (A) 06/24/2023 2258   HGBUR SMALL (A) 06/24/2023 2258   BILIRUBINUR NEGATIVE 06/24/2023 2258   KETONESUR NEGATIVE 06/24/2023 2258   PROTEINUR 100 (A) 06/24/2023 2258   UROBILINOGEN 0.2 05/19/2014 1847   NITRITE NEGATIVE 06/24/2023 2258   LEUKOCYTESUR LARGE (A)  06/24/2023 2258     ROS: Unable to obtain secondary to patient is falling asleep during the interview  Physical Exam: Vitals:   06/25/23 0803 06/25/23 0914  BP: (!) 182/67 (!) 125/39  Pulse: 95 70  Resp:  20  Temp:  100.2 F (37.9 C)  SpO2:       General: Elderly male in bed in no acute distress at rest HEENT: Normocephalic/atraumatic Eyes: Extraocular movements intact sclera anicteric Neck: Neck supple trachea midline Heart: S1-S2 no rub.  Tachycardic Lungs: Clear to auscultation bilaterally normal work of breathing at rest.  On room air Abdomen: Soft nontender nondistended.  Peritoneal dialysis catheter is in place and dressed Extremities: No edema appreciated.  No cyanosis or clubbing Skin: No rash on extremities exposed Neuro: Patient falls asleep easily during the interview.  He provides his name and he answers limited other questions Psych no anxiety or agitation  Outpatient PD unit:  [facility]   Assessment/Plan:   # End-stage renal disease - On peritoneal dialysis - Will continue PD tonight with 1.5% dianeal solution . - renal panel in AM - lasix  40 mg BID to resume tomorrow  - Will need to obtain his PD prescription next week once his unit is open  # Fever/chills - Concerning for UTI - Antibiotics per primary team - Cell count acceptable from PD fluid  #  Hypertension - optimize volume status with PD - continue home regimen    # Hypokalemia - replete with 40 meq PO once   # Anemia of CKD - Will need to determine date of last ESA administration   # Metabolic bone disease/secondary hyperparathyroidism - calcitriol  1 mcg daily per home med list   Thank you for the consult.  Please do not hesitate to contact me with any questions regarding our patient  Nan Aver 06/25/2023, 2:52 PM

## 2023-06-25 NOTE — Progress Notes (Signed)
 PHARMACY - PHYSICIAN COMMUNICATION CRITICAL VALUE ALERT - BLOOD CULTURE IDENTIFICATION (BCID)  Carlos Gill is an 83 y.o. male who presented to [facility] on 06/24/2023 with a chief complaint of UTI, sepsis, recent ESBL E Coli UTI  Assessment:  1/4 Blood cultures positive for ESBL E Coli   Name of physician (or Provider) Contacted: Dr. Sandria Cruise  Current antibiotics: Meropenem   Changes to prescribed antibiotics recommended:  Patient is on recommended antibiotics - No changes needed  Results for orders placed or performed during the hospital encounter of 06/24/23  Blood Culture ID Panel (Reflexed) (Collected: 06/24/2023 10:58 PM)  Result Value Ref Range   Enterococcus faecalis NOT DETECTED NOT DETECTED   Enterococcus Faecium NOT DETECTED NOT DETECTED   Listeria monocytogenes NOT DETECTED NOT DETECTED   Staphylococcus species NOT DETECTED NOT DETECTED   Staphylococcus aureus (BCID) NOT DETECTED NOT DETECTED   Staphylococcus epidermidis NOT DETECTED NOT DETECTED   Staphylococcus lugdunensis NOT DETECTED NOT DETECTED   Streptococcus species NOT DETECTED NOT DETECTED   Streptococcus agalactiae NOT DETECTED NOT DETECTED   Streptococcus pneumoniae NOT DETECTED NOT DETECTED   Streptococcus pyogenes NOT DETECTED NOT DETECTED   A.calcoaceticus-baumannii NOT DETECTED NOT DETECTED   Bacteroides fragilis NOT DETECTED NOT DETECTED   Enterobacterales DETECTED (A) NOT DETECTED   Enterobacter cloacae complex NOT DETECTED NOT DETECTED   Escherichia coli DETECTED (A) NOT DETECTED   Klebsiella aerogenes NOT DETECTED NOT DETECTED   Klebsiella oxytoca NOT DETECTED NOT DETECTED   Klebsiella pneumoniae NOT DETECTED NOT DETECTED   Proteus species NOT DETECTED NOT DETECTED   Salmonella species NOT DETECTED NOT DETECTED   Serratia marcescens NOT DETECTED NOT DETECTED   Haemophilus influenzae NOT DETECTED NOT DETECTED   Neisseria meningitidis NOT DETECTED NOT DETECTED   Pseudomonas aeruginosa NOT DETECTED  NOT DETECTED   Stenotrophomonas maltophilia NOT DETECTED NOT DETECTED   Candida albicans NOT DETECTED NOT DETECTED   Candida auris NOT DETECTED NOT DETECTED   Candida glabrata NOT DETECTED NOT DETECTED   Candida krusei NOT DETECTED NOT DETECTED   Candida parapsilosis NOT DETECTED NOT DETECTED   Candida tropicalis NOT DETECTED NOT DETECTED   Cryptococcus neoformans/gattii NOT DETECTED NOT DETECTED   CTX-M ESBL DETECTED (A) NOT DETECTED   Carbapenem resistance IMP NOT DETECTED NOT DETECTED   Carbapenem resistance KPC NOT DETECTED NOT DETECTED   Carbapenem resistance NDM NOT DETECTED NOT DETECTED   Carbapenem resist OXA 48 LIKE NOT DETECTED NOT DETECTED   Carbapenem resistance VIM NOT DETECTED NOT DETECTED    [name] 06/25/2023  2:28 PM

## 2023-06-26 ENCOUNTER — Inpatient Hospital Stay (HOSPITAL_COMMUNITY)

## 2023-06-26 DIAGNOSIS — B9620 Unspecified Escherichia coli [E. coli] as the cause of diseases classified elsewhere: Secondary | ICD-10-CM | POA: Diagnosis not present

## 2023-06-26 DIAGNOSIS — Z1612 Extended spectrum beta lactamase (ESBL) resistance: Secondary | ICD-10-CM | POA: Diagnosis not present

## 2023-06-26 DIAGNOSIS — A499 Bacterial infection, unspecified: Secondary | ICD-10-CM | POA: Diagnosis present

## 2023-06-26 DIAGNOSIS — N186 End stage renal disease: Secondary | ICD-10-CM | POA: Diagnosis not present

## 2023-06-26 DIAGNOSIS — A4150 Gram-negative sepsis, unspecified: Secondary | ICD-10-CM | POA: Diagnosis not present

## 2023-06-26 DIAGNOSIS — R7881 Bacteremia: Secondary | ICD-10-CM | POA: Diagnosis not present

## 2023-06-26 DIAGNOSIS — Z992 Dependence on renal dialysis: Secondary | ICD-10-CM

## 2023-06-26 DIAGNOSIS — N390 Urinary tract infection, site not specified: Secondary | ICD-10-CM | POA: Diagnosis not present

## 2023-06-26 LAB — ECHOCARDIOGRAM COMPLETE
AR max vel: 2.76 cm2
AV Area VTI: 2.79 cm2
AV Area mean vel: 2.73 cm2
AV Mean grad: 7.5 mmHg
AV Peak grad: 14 mmHg
Ao pk vel: 1.87 m/s
Area-P 1/2: 3.6 cm2
Calc EF: 50.6 %
Height: 72 in
S' Lateral: 3.6 cm
Single Plane A2C EF: 55.5 %
Single Plane A4C EF: 49.4 %
Weight: 3580.27 [oz_av]

## 2023-06-26 LAB — RENAL FUNCTION PANEL
Albumin: 2.4 g/dL — ABNORMAL LOW (ref 3.5–5.0)
Anion gap: 8 (ref 5–15)
BUN: 54 mg/dL — ABNORMAL HIGH (ref 8–23)
CO2: 22 mmol/L (ref 22–32)
Calcium: 7.9 mg/dL — ABNORMAL LOW (ref 8.9–10.3)
Chloride: 106 mmol/L (ref 98–111)
Creatinine, Ser: 4.16 mg/dL — ABNORMAL HIGH (ref 0.61–1.24)
GFR, Estimated: 13 mL/min — ABNORMAL LOW (ref >60)
Glucose, Bld: 59 mg/dL — ABNORMAL LOW (ref 70–99)
Phosphorus: 3.4 mg/dL (ref 2.5–4.6)
Potassium: 2.6 mmol/L — CL (ref 3.5–5.1)
Sodium: 136 mmol/L (ref 135–145)

## 2023-06-26 LAB — GLUCOSE, CAPILLARY
Glucose-Capillary: 128 mg/dL — ABNORMAL HIGH (ref 70–99)
Glucose-Capillary: 61 mg/dL — ABNORMAL LOW (ref 70–99)
Glucose-Capillary: 137 mg/dL — ABNORMAL HIGH (ref 70–99)

## 2023-06-26 LAB — POTASSIUM: Potassium: 3.2 mmol/L — ABNORMAL LOW (ref 3.5–5.1)

## 2023-06-26 MED ORDER — DELFLEX-LC/2.5% DEXTROSE 394 MOSM/L IP SOLN: INTRAPERITONEAL | Status: DC

## 2023-06-26 MED ORDER — POTASSIUM CHLORIDE CRYS ER 20 MEQ PO TBCR: 40.0000 meq | EXTENDED_RELEASE_TABLET | Freq: Once | ORAL | Status: DC

## 2023-06-26 MED ORDER — GENTAMICIN SULFATE 0.1 % EX CREA
1.0000 | TOPICAL_CREAM | Freq: Every day | CUTANEOUS | Status: DC
  Administered 2023-06-28: 1 via TOPICAL
  Filled 2023-06-26: qty 15

## 2023-06-26 MED ORDER — POTASSIUM CHLORIDE CRYS ER 20 MEQ PO TBCR
20.0000 meq | EXTENDED_RELEASE_TABLET | Freq: Once | ORAL | Status: AC
  Administered 2023-06-26: 20 meq via ORAL
  Filled 2023-06-26: qty 1

## 2023-06-26 MED ORDER — POTASSIUM CHLORIDE CRYS ER 20 MEQ PO TBCR
40.0000 meq | EXTENDED_RELEASE_TABLET | ORAL | Status: AC
  Administered 2023-06-26 (×2): 40 meq via ORAL
  Filled 2023-06-26 (×2): qty 2

## 2023-06-26 NOTE — Progress Notes (Signed)
   06/26/23 0735  Peritoneal Catheter Mid lower abdomen  No placement date or time found.   Catheter Location: Mid lower abdomen  Site Assessment Clean, Dry, Intact;Clean;Dry  Drainage Description None  Catheter status Capped;Clamped  Dressing Occlusive  Dressing Status Clean, Dry, Intact  Dressing Intervention Assessed, no intervention needed  Peritoneal Dialysis-Flush  Effluent appearance Clear;Yellow  Tolerated treatment well? Yes  Peritoneal Dialysis - Drain  Effluent Volume Out (mL) 330 ml  Effluent Appearance Clear;Yellow  Effluent to Lab No  Procedure Comments  Tolerated treatment well? Yes  Hand-off documentation  Hand-off Given Given to shift RN/LPN  Report given to (Full Name) Murrell Arrant, Deona  Hand-off Received Received from shift RN/LPN  Report received from (Full Name) Randee Busing RN   PD post treatment note. Tx has been tolerated.  PD treatment completed. Patient tolerated treatment well. PD effluent is clear. No specimen collected.  PD exit site clean, dry and intact. Patient is awake, oriented and in no acute distress.  Report given to bedside nurse.   Post treatment VS: 152/70, 64 HR, 97% O2, 99 /temp  Total UF removed:  330   Post treatment weight: 101.4kg

## 2023-06-26 NOTE — Progress Notes (Signed)
 PROGRESS NOTE    Carlos Gill  ZOX:[id number] DOB: 01/02/1941 DOA: 06/24/2023 PCP: Suan Elm, MD  Outpatient Specialists:     Brief Narrative:  Patient is an 83 year old male with past medical history significant for recent admission for UTI secondary to E. coli ESBL (05/26/2023 to 05/30/2023), end-stage renal disease on peritoneal dialysis, hypertension, hyperlipidemia, type 2 diabetes mellitus and OSA on CPAP.  As per prior documentation, patient completed meropenem  on 06/05/2023, but represented 2 days afterwards with subjective fever and chills.  Blood and urine cultures are growing E. coli ESBL.  CT scan of the abdomen and pelvis is nonrevealing.  Will have a low threshold to consult infectious disease team.  Patient is on IV meropenem .  Assessment & Plan:   Principal Problem:   Sepsis (HCC)   Relapsed urinary tract infection secondary to ESBL E. coli: Sepsis/bacteremia secondary to above, present on admission, improving: History recent ESBL E. Coli UTI: 2 days of nonspecific systemic symptoms with subjective fever and chills.  On initial evaluation febrile to 38.5C, tachycardic in the low 100s.  Exam benign, no CVA tenderness.  WBC 7.  Lactate 1.6 -> 1.  CT abdomen pelvis without contrast does have nonspecific perinephric stranding; no acute abnormality noted.  Suspect relapse UTI with possible ESBL species -S/p vancomycin.  Continue meropenem  pharmacy to dose -Follow-up blood cultures, urine culture, PD culture to be obtained by nephrology per ED report -Held off on large-volume IV fluid due to history ESRD and lactate is within normal limit and able to take p.o.'s, continue oral hydration.  Temporarily held Lasix  -If he has relapse ESBL infection would involve ID to help guide antibiotics and course -Contact precautions 06/26/2023: Continue IV meropenem .   Incidental findings:  Trace pericardial effusion  Aortic atherosclerosis    Chronic medical problems: ESRD on PD:   -Nephrology team is managing.  Hypokalemia: -Potassium of 2.6. - Check magnesium  level. - KCl 40 mEq p.o. every 4 hours x 2 doses. - Continue to monitor renal function and electrolytes.  Diabetes type 2 with hyperglycemia:  - Controlled. - Continue home basal 40 units twice daily.  SSI for sensitive, at bedtime correction  Hypertension:  - Reasonably controlled. - Continue home hydralazine  100 mg 3 times daily, metoprolol  100/75 mg AM/PM  Hyperlipidemia:  -Continue home Zetia   OSA:  -CPAP nightly  BPH: - Continue tamsulosin    DVT prophylaxis: Subcutaneous heparin . Code Status: Full code. Family Communication:  Disposition Plan: Patient remains inpatient.   Consultants:  Blood pressure to consult infectious disease team.  Procedures:  None.  Antimicrobials:  IV meropenem .   Subjective: No new complaints. No fever or chills.  Objective: Vitals:   06/26/23 0210 06/26/23 0451 06/26/23 0844 06/26/23 0848  BP: 139/65 (!) 141/60 (!) 156/68 (!) 156/68  Pulse: (!) 57 64 61 61  Resp: 17 17 20    Temp: 98.9 F (37.2 C) 98.5 F (36.9 C) 98.5 F (36.9 C)   TempSrc: Oral Oral Oral   SpO2: 100% 99% 98%   Weight:      Height:        Intake/Output Summary (Last 24 hours) at 06/26/2023 1210 Last data filed at 06/25/2023 1635 Gross per 24 hour  Intake 100 ml  Output --  Net 100 ml   Filed Weights   06/24/23 1823 06/25/23 1751  Weight: 98.4 kg 101.5 kg    Examination:  General exam: Appears calm and comfortable  Respiratory system: Clear to auscultation.  Cardiovascular system: S1 & S2 heard  Gastrointestinal system: Abdomen is soft and nontender.  Central nervous system: Alert and oriented.  Moves all extremities.   Data Reviewed: I have personally reviewed following labs and imaging studies  CBC: Recent Labs  Lab 06/24/23 1818 06/25/23 0815  WBC 7.1 8.4  NEUTROABS 5.5 6.9  HGB 10.9* 9.8*  HCT 33.8* 30.0*  MCV 83.5 83.3  PLT 161 134*   Basic  Metabolic Panel: Recent Labs  Lab 06/24/23 1818 06/25/23 0718 06/25/23 0815 06/26/23 0735  NA 133* 136  --  136  K 3.3* 2.8*  --  2.6*  CL 97* 103  --  106  CO2 22 20*  --  22  GLUCOSE 429* 199*  --  59*  BUN 50* 54*  --  54*  CREATININE 4.49* 4.38*  --  4.16*  CALCIUM 8.6* 8.2*  --  7.9*  MG  --   --  1.5*  --   PHOS  --  3.6  --  3.4   GFR: Estimated Creatinine Clearance: 16.6 mL/min (A) (by C-G formula based on SCr of 4.16 mg/dL (H)). Liver Function Tests: Recent Labs  Lab 06/25/23 0718 06/26/23 0735  ALBUMIN 2.7* 2.4*   No results for input(s): "LIPASE", "AMYLASE" in the last 168 hours. No results for input(s): "AMMONIA" in the last 168 hours. Coagulation Profile: No results for input(s): "INR", "PROTIME" in the last 168 hours. Cardiac Enzymes: No results for input(s): "CKTOTAL", "CKMB", "CKMBINDEX", "TROPONINI" in the last 168 hours. BNP (last 3 results) No results for input(s): "PROBNP" in the last 8760 hours. HbA1C: No results for input(s): "HGBA1C" in the last 72 hours. CBG: Recent Labs  Lab 06/25/23 0129 06/25/23 1250 06/25/23 2111  GLUCAP 309* 110* 239*   Lipid Profile: No results for input(s): "CHOL", "HDL", "LDLCALC", "TRIG", "CHOLHDL", "LDLDIRECT" in the last 72 hours. Thyroid Function Tests: No results for input(s): "TSH", "T4TOTAL", "FREET4", "T3FREE", "THYROIDAB" in the last 72 hours. Anemia Panel: No results for input(s): "VITAMINB12", "FOLATE", "FERRITIN", "TIBC", "IRON", "RETICCTPCT" in the last 72 hours. Urine analysis:    Component Value Date/Time   COLORURINE YELLOW 06/24/2023 2258   APPEARANCEUR CLOUDY (A) 06/24/2023 2258   LABSPEC 1.009 06/24/2023 2258   PHURINE 5.0 06/24/2023 2258   GLUCOSEU >=500 (A) 06/24/2023 2258   HGBUR SMALL (A) 06/24/2023 2258   BILIRUBINUR NEGATIVE 06/24/2023 2258   KETONESUR NEGATIVE 06/24/2023 2258   PROTEINUR 100 (A) 06/24/2023 2258   UROBILINOGEN 0.2 05/19/2014 1847   NITRITE NEGATIVE 06/24/2023 2258    LEUKOCYTESUR LARGE (A) 06/24/2023 2258   Sepsis Labs: @LABRCNTIP (procalcitonin:4,lacticidven:4)  ) Recent Results (from the past 240 hours)  Blood culture (routine x 2)     Status: None (Preliminary result)   Collection Time: 06/24/23 10:45 PM   Specimen: BLOOD  Result Value Ref Range Status   Specimen Description BLOOD RIGHT ANTECUBITAL  Final   Special Requests   Final    BOTTLES DRAWN AEROBIC AND ANAEROBIC Blood Culture adequate volume   Culture   Final    NO GROWTH 2 DAYS Performed at [facility], [address] [place], [place] [address]    Report Status PENDING  Incomplete  Blood culture (routine x 2)     Status: Abnormal (Preliminary result)   Collection Time: 06/24/23 10:58 PM   Specimen: BLOOD RIGHT HAND  Result Value Ref Range Status   Specimen Description BLOOD RIGHT HAND  Final   Special Requests   Final    BOTTLES DRAWN AEROBIC AND ANAEROBIC Blood Culture  adequate volume   Culture  Setup Time   Final    GRAM NEGATIVE RODS AEROBIC BOTTLE ONLY CRITICAL RESULT CALLED TO, READ BACK BY AND VERIFIED WITH: Pharmd Nathan G on [id number] @1422  by SM    Culture (A)  Final    ESCHERICHIA COLI SUSCEPTIBILITIES TO FOLLOW Performed at [facility], [address], [place], [place] [address]    Report Status PENDING  Incomplete  Urine Culture     Status: Abnormal (Preliminary result)   Collection Time: 06/24/23 10:58 PM   Specimen: Urine, Random  Result Value Ref Range Status   Specimen Description URINE, RANDOM  Final   Special Requests NONE Reflexed from [id number]  Final   Culture (A)  Final    >=100,000 COLONIES/mL ESCHERICHIA COLI SUSCEPTIBILITIES TO FOLLOW Performed at [facility], [address], [place], [place] [address]    Report Status PENDING  Incomplete  Blood Culture ID Panel (Reflexed)     Status: Abnormal   Collection Time: 06/24/23 10:58 PM  Result Value Ref Range Status   Enterococcus faecalis NOT DETECTED NOT DETECTED Final    Enterococcus Faecium NOT DETECTED NOT DETECTED Final   Listeria monocytogenes NOT DETECTED NOT DETECTED Final   Staphylococcus species NOT DETECTED NOT DETECTED Final   Staphylococcus aureus (BCID) NOT DETECTED NOT DETECTED Final   Staphylococcus epidermidis NOT DETECTED NOT DETECTED Final   Staphylococcus lugdunensis NOT DETECTED NOT DETECTED Final   Streptococcus species NOT DETECTED NOT DETECTED Final   Streptococcus agalactiae NOT DETECTED NOT DETECTED Final   Streptococcus pneumoniae NOT DETECTED NOT DETECTED Final   Streptococcus pyogenes NOT DETECTED NOT DETECTED Final   A.calcoaceticus-baumannii NOT DETECTED NOT DETECTED Final   Bacteroides fragilis NOT DETECTED NOT DETECTED Final   Enterobacterales DETECTED (A) NOT DETECTED Final    Comment: Enterobacterales represent a large order of gram negative bacteria, not a single organism. CRITICAL RESULT CALLED TO, READ BACK BY AND VERIFIED WITH: Pharmd Nathan G on [id number] @1422  by SM    Enterobacter cloacae complex NOT DETECTED NOT DETECTED Final   Escherichia coli DETECTED (A) NOT DETECTED Final    Comment: CRITICAL RESULT CALLED TO, READ BACK BY AND VERIFIED WITH: Pharmd Johnita Nails on [phone] @1422  by SM    Klebsiella aerogenes NOT DETECTED NOT DETECTED Final   Klebsiella oxytoca NOT DETECTED NOT DETECTED Final   Klebsiella pneumoniae NOT DETECTED NOT DETECTED Final   Proteus species NOT DETECTED NOT DETECTED Final   Salmonella species NOT DETECTED NOT DETECTED Final   Serratia marcescens NOT DETECTED NOT DETECTED Final   Haemophilus influenzae NOT DETECTED NOT DETECTED Final   Neisseria meningitidis NOT DETECTED NOT DETECTED Final   Pseudomonas aeruginosa NOT DETECTED NOT DETECTED Final   Stenotrophomonas maltophilia NOT DETECTED NOT DETECTED Final   Candida albicans NOT DETECTED NOT DETECTED Final   Candida auris NOT DETECTED NOT DETECTED Final   Candida glabrata NOT DETECTED NOT DETECTED Final   Candida krusei NOT DETECTED NOT  DETECTED Final   Candida parapsilosis NOT DETECTED NOT DETECTED Final   Candida tropicalis NOT DETECTED NOT DETECTED Final   Cryptococcus neoformans/gattii NOT DETECTED NOT DETECTED Final   CTX-M ESBL DETECTED (A) NOT DETECTED Final    Comment: CRITICAL RESULT CALLED TO, READ BACK BY AND VERIFIED WITH: Pharmd Nathan G on [id number] @1422  by SM (NOTE) Extended spectrum beta-lactamase detected. Recommend a carbapenem as initial therapy.      Carbapenem resistance IMP NOT DETECTED NOT DETECTED Final   Carbapenem resistance  KPC NOT DETECTED NOT DETECTED Final   Carbapenem resistance NDM NOT DETECTED NOT DETECTED Final   Carbapenem resist OXA 48 LIKE NOT DETECTED NOT DETECTED Final   Carbapenem resistance VIM NOT DETECTED NOT DETECTED Final    Comment: Performed at [facility], [address] [place], [place] [address]  Body fluid culture w Gram Stain     Status: None (Preliminary result)   Collection Time: 06/24/23 11:48 PM   Specimen: Peritoneal Dialysate; Body Fluid  Result Value Ref Range Status   Specimen Description PERITONEAL DIALYSATE  Final   Special Requests NONE  Final   Gram Stain NO WBC SEEN NO ORGANISMS SEEN   Final   Culture   Final    NO GROWTH 1 DAY Performed at [facility], [address] [place], [place] [address]    Report Status PENDING  Incomplete  Resp panel by RT-PCR (RSV, Flu A&B, Covid) Anterior Nasal Swab     Status: None   Collection Time: 06/25/23 12:09 AM   Specimen: Anterior Nasal Swab  Result Value Ref Range Status   SARS Coronavirus 2 by RT PCR NEGATIVE NEGATIVE Final   Influenza A by PCR NEGATIVE NEGATIVE Final   Influenza B by PCR NEGATIVE NEGATIVE Final    Comment: (NOTE) The Xpert Xpress SARS-CoV-2/FLU/RSV plus assay is intended as an aid in the diagnosis of influenza from Nasopharyngeal swab specimens and should not be used as a sole basis for treatment. Nasal washings and aspirates are unacceptable for Xpert Xpress  SARS-CoV-2/FLU/RSV testing.  Fact Sheet for Patients: BloggerCourse.com  Fact Sheet for Healthcare Providers: SeriousBroker.it  This test is not yet approved or cleared by the [place]  FDA and has been authorized for detection and/or diagnosis of SARS-CoV-2 by FDA under an Emergency Use Authorization (EUA). This EUA will remain in effect (meaning this test can be used) for the duration of the COVID-19 declaration under Section 564(b)(1) of the Act, 21 U.S.C. section 360bbb-3(b)(1), unless the authorization is terminated or revoked.     Resp Syncytial Virus by PCR NEGATIVE NEGATIVE Final    Comment: (NOTE) Fact Sheet for Patients: BloggerCourse.com  Fact Sheet for Healthcare Providers: SeriousBroker.it  This test is not yet approved or cleared by the [place]  FDA and has been authorized for detection and/or diagnosis of SARS-CoV-2 by FDA under an Emergency Use Authorization (EUA). This EUA will remain in effect (meaning this test can be used) for the duration of the COVID-19 declaration under Section 564(b)(1) of the Act, 21 U.S.C. section 360bbb-3(b)(1), unless the authorization is terminated or revoked.  Performed at [facility], [address] [place], [place] [address]          Radiology Studies: CT ABDOMEN PELVIS WO CONTRAST Result Date: 06/24/2023 CLINICAL DATA:  bilateral back pain; concern for pyleo vs stone EXAM: CT ABDOMEN AND PELVIS WITHOUT CONTRAST TECHNIQUE: Multidetector CT imaging of the abdomen and pelvis was performed following the standard protocol without IV contrast. RADIATION DOSE REDUCTION: This exam was performed according to the departmental dose-optimization program which includes automated exposure control, adjustment of the mA and/or kV according to patient size and/or use of iterative reconstruction technique. COMPARISON:  CT  abdomen pelvis 11/01/2021 FINDINGS: Lower chest: Trace pericardial effusion. Hepatobiliary: No focal liver abnormality. No gallstones, gallbladder wall thickening, or pericholecystic fluid. No biliary dilatation. Pancreas: No focal lesion. Normal pancreatic contour. No surrounding inflammatory changes. No main pancreatic ductal dilatation. Spleen: Normal in size without focal abnormality. Adrenals/Urinary  Tract: No adrenal nodule bilaterally. Nonspecific bilateral perinephric fat stranding. No nephrolithiasis and no hydronephrosis. Fluid density lesions likely represent simple renal cysts. Simple renal cysts, in the absence of clinically indicated signs/symptoms, require no independent follow-up. No ureterolithiasis or hydroureter. The urinary bladder is unremarkable. Stomach/Bowel: Stomach is within normal limits. No evidence of bowel wall thickening or dilatation. Appendix appears normal. Vascular/Lymphatic: No abdominal aorta or iliac aneurysm. Mild atherosclerotic plaque of the aorta and its branches. No abdominal, pelvic, or inguinal lymphadenopathy. Reproductive: Prostate is unremarkable.  Likely TURP procedure. Other: Peritoneal dialysis catheter within the pelvis. No intraperitoneal free fluid. No intraperitoneal free gas. No organized fluid collection. Musculoskeletal: Soft tissue densities along the anterior abdominal wall likely related to medication injection. No hernia. Mild subcutaneus soft tissue edema. No suspicious lytic or blastic osseous lesions. No acute displaced fracture. Coarsening of the right iliac bone trabecula stable compared to prior. IMPRESSION: 1. No acute intra-abdominal intrapelvic abnormality with limited evaluation on this noncontrast study. 2. Trace pericardial effusion. 3.  Aortic Atherosclerosis (ICD10-I70.0). Electronically Signed   By: Morgane  Naveau M.D.   On: 06/24/2023 22:43   DG Chest Portable 1 View Result Date: 06/24/2023 CLINICAL DATA:  Cough EXAM: PORTABLE CHEST 1  VIEW COMPARISON:  05/26/2023 FINDINGS: Single frontal view of the chest demonstrates a stable enlarged cardiac silhouette. No acute airspace disease, effusion, or pneumothorax. No acute bony abnormalities. IMPRESSION: 1. No acute intrathoracic process. Electronically Signed   By: Bobbye Burrow M.D.   On: 06/24/2023 22:38        Scheduled Meds:  aspirin  EC  81 mg Oral q morning   calcitRIOL   1 mcg Oral Daily   ezetimibe   10 mg Oral Daily   gabapentin   100 mg Oral Daily   gabapentin   200 mg Oral QHS   gentamicin  cream  1 Application Topical Daily   gentamicin  cream  1 Application Topical Daily   heparin   5,000 Units Subcutaneous Q8H   hydrALAZINE   100 mg Oral TID   insulin  glargine-yfgn  40 Units Subcutaneous BID   metoprolol  tartrate  100 mg Oral Daily   metoprolol  tartrate  75 mg Oral QHS   pantoprazole   40 mg Oral Daily   potassium chloride   40 mEq Oral Q4H   sodium chloride  flush  3 mL Intravenous Q12H   tamsulosin   0.4 mg Oral QHS   Continuous Infusions:  dialysis solution 1.5% low-MG/low-CA     dialysis solution 2.5% low-MG/low-CA     meropenem  (MERREM ) IV 500 mg (06/26/23 0530)     LOS: 2 days    Time spent: 55 minutes.    Fonnie Iba, MD  [facility] Pager #: [phone] 7PM-7AM contact night coverage as above

## 2023-06-26 NOTE — Progress Notes (Signed)
 Echocardiogram 2D Echocardiogram has been performed.  Carlos Gill 06/26/2023, 6:10 PM

## 2023-06-26 NOTE — Consult Note (Signed)
 Date of Admission:  06/24/2023          Reason for Consult: ESBL E coli bacteremia    Referring Provider: Fonnie Iba, MD   Assessment:  ESBL E coli bacteremia presumably from urinary source w with E coli in urine and  Recent admission in April with sepsis and ESBL in urine (though not in blood) sp 7 days of carbapenem previous central line that was promptly removed and the last day of antibiotics End-stage renal disease on hemodialysis   Plan:  Continue meropenem  Repeat blood cultures 2D echocardiogram Contact precautions  Dr. Shereen Dike will be back tomorrow.   Principal Problem:   ESBL (extended spectrum beta-lactamase) producing bacteria infection Active Problems:   Sepsis (HCC)   Bacteremia   Scheduled Meds:  aspirin  EC  81 mg Oral q morning   calcitRIOL   1 mcg Oral Daily   ezetimibe   10 mg Oral Daily   gabapentin   100 mg Oral Daily   gabapentin   200 mg Oral QHS   gentamicin  cream  1 Application Topical Daily   gentamicin  cream  1 Application Topical Daily   heparin   5,000 Units Subcutaneous Q8H   hydrALAZINE   100 mg Oral TID   insulin  glargine-yfgn  40 Units Subcutaneous BID   metoprolol  tartrate  100 mg Oral Daily   metoprolol  tartrate  75 mg Oral QHS   pantoprazole   40 mg Oral Daily   sodium chloride  flush  3 mL Intravenous Q12H   tamsulosin   0.4 mg Oral QHS   Continuous Infusions:  dialysis solution 1.5% low-MG/low-CA     dialysis solution 2.5% low-MG/low-CA     meropenem  (MERREM ) IV 500 mg (06/26/23 0530)   PRN Meds:.acetaminophen , albuterol , melatonin, polyethylene glycol  HPI: Carlos Gill is a 83 y.o. male with end-stage renal disease on peritoneal dialysis who was admitted in late April with decreased appetite and low-grade fever and found to have extended spectrum beta-lactamase producing E. coli in his urine.  He did not have specific urinary symptoms it seems but this was thought to have been an infection.  His blood cultures of note  were negative during that hospitalization.  He had a central line placed to complete IV meropenem  which he did on 6 May.  The same day the central line was removed by interventional radiology.  Had been doing relatively well until 2 days prior to his recent admission on the 24th.  He then developed symptoms of fevers chills malaise he had some low back pain as well he was not having dysuria or any specific urinary symptoms though he does not produce much urine being a dialysis patient.  UA done on this admission had greater than 500 glucose with large leukocyte esterase on dipstick 100 of protein with many bacteria seen and >50 WBC.  Blood cultures have subsequently grown ESBL E. coli identified on BC ID.  Urine cultures are also growing E. coli with sensitivities pending.  Peritoneal dialysis site was analyzed through a cell count and differential on the 24th with only 14 nucleated cells seen at the time.  He had a CT of the abdomen pelvis performed which did not show any acute intra-abdominal pathology, abdominal abscess no kidney stones no hydronephrosis.  Improving on meropenem  which was started empirically after cultures were taken.  It is NOT clear to me why he would have recurrence of this blood stream infection.  With him being a dialysis patient is difficult to assess for  urinary symptoms which are required for diagnosis of urinary tract infection though currently other than the urine we cannot see an obvious other source of infection.  He does not have any wounds on his feet or elsewhere on his body to serve as an alternate portal of entry his central line was removed promptly the day he finished antibiotics.  He does not have kidney stones, or an indwelling foley catheter or a renal abscess. Perhaps it is as simple as colonization and recurrence.  I would worry about the PD catheter though fluid Alysis) is all although taken a day after he had been admitted and on antibiotics already)  was benign and his abdominal exam is also benign.  I was initially led to believe that he had been bacteremic during his prior hospitalization but he was not bacteremic then he is now.  Will currently continue with carbapenem therapy and repeat blood cultures.  Will obtain a 2D echocardiogram.  I dont know that we have enough evidence to compel us  to ask for PD catheter removal and placement of a new one but would definitely keep this in mind.  If this organism has the same sensitivity profile as the previous organism did he will again need carbapenem therapy to complete treatment.   I have personally spent 84 minutes involved in face-to-face and non-face-to-face activities for this patient on the day of the visit. Professional time spent includes the following activities: Preparing to see the patient review of his pertinent microbiological data CBC CMP UA CT abdomen pelvis), Obtaining and/or reviewing separately obtained history notes from his prior admission and discharge and outpatient infectious disease well as this admission performing a medically appropriate examination and/or evaluation , Ordering meropenem , repeat blood cultures 2D echocardiogram referring and communicating with other health care professionals, Documenting clinical information in the EMR, Independently interpreting results (not separately reported), Communicating results to the patient/family/caregiver, Counseling and educating the patient/family/caregiver and Care coordination (not separately reported).   Evaluation of the patient requires complex antimicrobial therapy evaluation, counseling , isolation needs to reduce disease transmission and risk assessment and mitigation.       Review of Systems: Review of Systems  Constitutional:  Positive for chills, fever and malaise/fatigue. Negative for weight loss.  HENT:  Negative for congestion and sore throat.   Eyes:  Negative for blurred vision and photophobia.   Respiratory:  Negative for cough, shortness of breath and wheezing.   Cardiovascular:  Negative for chest pain, palpitations and leg swelling.  Gastrointestinal:  Negative for abdominal pain, blood in stool, constipation, diarrhea, heartburn, melena, nausea and vomiting.  Genitourinary:  Negative for dysuria, flank pain and hematuria.  Musculoskeletal:  Negative for back pain, falls, joint pain and myalgias.  Skin:  Negative for itching and rash.  Neurological:  Negative for dizziness, focal weakness, loss of consciousness, weakness and headaches.  Endo/Heme/Allergies:  Does not bruise/bleed easily.  Psychiatric/Behavioral:  Negative for depression and suicidal ideas. The patient does not have insomnia.     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 Last Assessment &  Plan:  Formatting of this note is different from the original.  - We will continue Flomax .   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   Diabetes mellitus without complication (HCC)    Enlarged prostate 04/21/2021   Epididymitis 11/09/2021   Essential hypertension 11/02/2021   Fatigue 12/23/2015   GERD without esophagitis 11/02/2021   Hyperglycemia due to diabetes mellitus (HCC) 11/09/2021   Hyperlipidemia    Hypertension    Macular degeneration 04/21/2021   Nonexudative age-related macular degeneration  04/19/2018   Obesity 04/11/2017   Osteoarthritis 11/11/2008   Other chest pain 04/21/2021   Proteinuria 01/21/2021   Rhinitis, chronic 03/09/2021   Shortness of breath 04/21/2021   Sleep apnea    cpap   Trochanteric bursitis of right hip 04/27/2017   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    Social History   Tobacco Use   Smoking status: Former    Current packs/day: 0.00    Types: Cigarettes    Quit date: 02/01/1990    Years since quitting: 33.4   Smokeless tobacco: Never  Vaping Use   Vaping status: Never Used  Substance Use Topics   Alcohol  use: No   Drug use: No    Family History  Problem Relation Age of Onset   Diabetes Mother    Prostate cancer Maternal Grandfather    Colon cancer Neg Hx    Allergies  Allergen Reactions   Statins Swelling and Other (See Comments)    Muscle aches   Wound Dressing Adhesive Dermatitis    Pt has rash on L arm from tape "used with stitches"    OBJECTIVE: Blood pressure (!) 156/68, pulse 61, temperature 98.5 F (36.9 C), temperature source Oral, resp. rate 20, height 6' (1.829 m), weight 101.5 kg, SpO2 98%.  Physical Exam Constitutional:      Appearance: He is well-developed.  HENT:     Head: Normocephalic and atraumatic.  Eyes:     Conjunctiva/sclera: Conjunctivae normal.  Cardiovascular:     Rate and Rhythm: Normal rate and regular rhythm.  Pulmonary:     Effort: Pulmonary effort is normal. No respiratory distress.     Breath sounds: No wheezing.  Abdominal:     General: There is no distension.     Palpations: Abdomen is soft. There is no mass.     Tenderness: There is no abdominal tenderness.     Hernia: No hernia is present.  Musculoskeletal:        General: No tenderness. Normal range of motion.     Cervical back: Normal range of motion and neck supple.  Skin:    General: Skin is warm and dry.     Coloration: Skin is not pale.     Findings: No erythema or rash.  Neurological:      General: No focal deficit present.     Mental Status: He is alert and oriented to person, place, and time.  Psychiatric:        Mood and Affect: Mood normal.        Behavior: Behavior normal.        Thought Content: Thought content normal.        Judgment: Judgment normal.    PD site is completely clean  Lab Results Lab Results  Component Value Date   WBC 8.4 06/25/2023   HGB 9.8 (L) 06/25/2023   HCT 30.0 (L)  06/25/2023   MCV 83.3 06/25/2023   PLT 134 (L) 06/25/2023    Lab Results  Component Value Date   CREATININE 4.16 (H) 06/26/2023   BUN 54 (H) 06/26/2023   NA 136 06/26/2023   K 3.2 (L) 06/26/2023   CL 106 06/26/2023   CO2 22 06/26/2023    Lab Results  Component Value Date   ALT 26 05/26/2023   AST 22 05/26/2023   ALKPHOS 76 05/26/2023   BILITOT 0.8 05/26/2023     Microbiology: Recent Results (from the past 240 hours)  Blood culture (routine x 2)     Status: None (Preliminary result)   Collection Time: 06/24/23 10:45 PM   Specimen: BLOOD  Result Value Ref Range Status   Specimen Description BLOOD RIGHT ANTECUBITAL  Final   Special Requests   Final    BOTTLES DRAWN AEROBIC AND ANAEROBIC Blood Culture adequate volume   Culture  Setup Time   Final    GRAM NEGATIVE RODS AEROBIC BOTTLE ONLY CRITICAL VALUE NOTED.  VALUE IS CONSISTENT WITH PREVIOUSLY REPORTED AND CALLED VALUE. Performed at [facility], [address] [place], [place] [address]    Culture GRAM NEGATIVE RODS  Final   Report Status PENDING  Incomplete  Blood culture (routine x 2)     Status: Abnormal (Preliminary result)   Collection Time: 06/24/23 10:58 PM   Specimen: BLOOD RIGHT HAND  Result Value Ref Range Status   Specimen Description BLOOD RIGHT HAND  Final   Special Requests   Final    BOTTLES DRAWN AEROBIC AND ANAEROBIC Blood Culture adequate volume   Culture  Setup Time   Final    GRAM NEGATIVE RODS AEROBIC BOTTLE ONLY CRITICAL RESULT CALLED TO, READ BACK BY AND VERIFIED WITH:  Pharmd Nathan G on [id number] @1422  by SM    Culture (A)  Final    ESCHERICHIA COLI SUSCEPTIBILITIES TO FOLLOW Performed at [facility], [address], [place], [place] [address]    Report Status PENDING  Incomplete  Urine Culture     Status: Abnormal (Preliminary result)   Collection Time: 06/24/23 10:58 PM   Specimen: Urine, Random  Result Value Ref Range Status   Specimen Description URINE, RANDOM  Final   Special Requests NONE Reflexed from [id number]  Final   Culture (A)  Final    >=100,000 COLONIES/mL ESCHERICHIA COLI SUSCEPTIBILITIES TO FOLLOW Performed at [facility], [address], [place], [place] [address]    Report Status PENDING  Incomplete  Blood Culture ID Panel (Reflexed)     Status: Abnormal   Collection Time: 06/24/23 10:58 PM  Result Value Ref Range Status   Enterococcus faecalis NOT DETECTED NOT DETECTED Final   Enterococcus Faecium NOT DETECTED NOT DETECTED Final   Listeria monocytogenes NOT DETECTED NOT DETECTED Final   Staphylococcus species NOT DETECTED NOT DETECTED Final   Staphylococcus aureus (BCID) NOT DETECTED NOT DETECTED Final   Staphylococcus epidermidis NOT DETECTED NOT DETECTED Final   Staphylococcus lugdunensis NOT DETECTED NOT DETECTED Final   Streptococcus species NOT DETECTED NOT DETECTED Final   Streptococcus agalactiae NOT DETECTED NOT DETECTED Final   Streptococcus pneumoniae NOT DETECTED NOT DETECTED Final   Streptococcus pyogenes NOT DETECTED NOT DETECTED Final   A.calcoaceticus-baumannii NOT DETECTED NOT DETECTED Final   Bacteroides fragilis NOT DETECTED NOT DETECTED Final   Enterobacterales DETECTED (A) NOT DETECTED Final    Comment: Enterobacterales represent a large order of gram negative bacteria, not a single organism. CRITICAL  RESULT CALLED TO, READ BACK BY AND VERIFIED WITH: Pharmd Nathan G on [id number] @1422  by SM    Enterobacter cloacae complex NOT DETECTED NOT DETECTED Final   Escherichia coli DETECTED (A) NOT  DETECTED Final    Comment: CRITICAL RESULT CALLED TO, READ BACK BY AND VERIFIED WITH: Pharmd Nathan G on [phone] @1422  by SM    Klebsiella aerogenes NOT DETECTED NOT DETECTED Final   Klebsiella oxytoca NOT DETECTED NOT DETECTED Final   Klebsiella pneumoniae NOT DETECTED NOT DETECTED Final   Proteus species NOT DETECTED NOT DETECTED Final   Salmonella species NOT DETECTED NOT DETECTED Final   Serratia marcescens NOT DETECTED NOT DETECTED Final   Haemophilus influenzae NOT DETECTED NOT DETECTED Final   Neisseria meningitidis NOT DETECTED NOT DETECTED Final   Pseudomonas aeruginosa NOT DETECTED NOT DETECTED Final   Stenotrophomonas maltophilia NOT DETECTED NOT DETECTED Final   Candida albicans NOT DETECTED NOT DETECTED Final   Candida auris NOT DETECTED NOT DETECTED Final   Candida glabrata NOT DETECTED NOT DETECTED Final   Candida krusei NOT DETECTED NOT DETECTED Final   Candida parapsilosis NOT DETECTED NOT DETECTED Final   Candida tropicalis NOT DETECTED NOT DETECTED Final   Cryptococcus neoformans/gattii NOT DETECTED NOT DETECTED Final   CTX-M ESBL DETECTED (A) NOT DETECTED Final    Comment: CRITICAL RESULT CALLED TO, READ BACK BY AND VERIFIED WITH: Pharmd Nathan G on [phone] @1422  by SM (NOTE) Extended spectrum beta-lactamase detected. Recommend a carbapenem as initial therapy.      Carbapenem resistance IMP NOT DETECTED NOT DETECTED Final   Carbapenem resistance KPC NOT DETECTED NOT DETECTED Final   Carbapenem resistance NDM NOT DETECTED NOT DETECTED Final   Carbapenem resist OXA 48 LIKE NOT DETECTED NOT DETECTED Final   Carbapenem resistance VIM NOT DETECTED NOT DETECTED Final    Comment: Performed at [facility], [address], [place], [place] [address]  Body fluid culture w Gram Stain     Status: None (Preliminary result)   Collection Time: 06/24/23 11:48 PM   Specimen: Peritoneal Dialysate; Body Fluid  Result Value Ref Range Status   Specimen Description  PERITONEAL DIALYSATE  Final   Special Requests NONE  Final   Gram Stain NO WBC SEEN NO ORGANISMS SEEN   Final   Culture   Final    NO GROWTH 1 DAY Performed at [facility], [address] [place], [place] [address]    Report Status PENDING  Incomplete  Resp panel by RT-PCR (RSV, Flu A&B, Covid) Anterior Nasal Swab     Status: None   Collection Time: 06/25/23 12:09 AM   Specimen: Anterior Nasal Swab  Result Value Ref Range Status   SARS Coronavirus 2 by RT PCR NEGATIVE NEGATIVE Final   Influenza A by PCR NEGATIVE NEGATIVE Final   Influenza B by PCR NEGATIVE NEGATIVE Final    Comment: (NOTE) The Xpert Xpress SARS-CoV-2/FLU/RSV plus assay is intended as an aid in the diagnosis of influenza from Nasopharyngeal swab specimens and should not be used as a sole basis for treatment. Nasal washings and aspirates are unacceptable for Xpert Xpress SARS-CoV-2/FLU/RSV testing.  Fact Sheet for Patients: BloggerCourse.com  Fact Sheet for Healthcare Providers: SeriousBroker.it  This test is not yet approved or cleared by the [place]  FDA and has been authorized for detection and/or diagnosis of SARS-CoV-2 by FDA under an Emergency Use Authorization (EUA). This EUA will remain in effect (meaning this test can be used) for the duration of  the COVID-19 declaration under Section 564(b)(1) of the Act, 21 U.S.C. section 360bbb-3(b)(1), unless the authorization is terminated or revoked.     Resp Syncytial Virus by PCR NEGATIVE NEGATIVE Final    Comment: (NOTE) Fact Sheet for Patients: BloggerCourse.com  Fact Sheet for Healthcare Providers: SeriousBroker.it  This test is not yet approved or cleared by the [place]  FDA and has been authorized for detection and/or diagnosis of SARS-CoV-2 by FDA under an Emergency Use Authorization (EUA). This EUA will remain in effect (meaning  this test can be used) for the duration of the COVID-19 declaration under Section 564(b)(1) of the Act, 21 U.S.C. section 360bbb-3(b)(1), unless the authorization is terminated or revoked.  Performed at [facility], [address], [place], [place] [address]     Sheela Denmark, MD [facility] for Infectious Disease [facility] [phone] pager  06/26/2023, 2:35 PM

## 2023-06-26 NOTE — Plan of Care (Signed)

## 2023-06-26 NOTE — Progress Notes (Signed)
 [facility] Progress Note  Name: Carlos Gill MRN: [id number] DOB: Mar 19, 1940  Chief Complaint:  Fever/chills and back ache  Subjective:  Patient is more awake today and provides more history.  Had chills and slight ever at home as well as back ache.  Feels better today.  He goes to [facility] for PD and does 4 cycles over 8 hours.  He thinks fill volumes are 2.5 liters.  Not sure.  He usually uses 2.5% dianeal solution. States his weight is usually around 97 kg.   Overnight (tx ending 5/25) he had 330 mL UF with PD per the machine screen    Review of systems:  Denies shortness of breath or chest pain  Denies nausea/vomiting; reports diarrhea   ------------------------- Background on consult:  Carlos Gill is a 83 y.o. male with a history of end-stage renal disease on peritoneal dialysis, hypertension, BPH, type 2 diabetes mellitus, and OSA on CPAP who presented to the hospital with fever and chills as well as flank pain.  He had a recent admission with a UTI.  Nephrology is consulted for assistance with management of peritoneal dialysis and ESRD care.  We requested a peritoneal fluid cell count and culture be sent.  He did not get PD overnight as he was in the ER.  On exam today, the patient is very sleepy.  He provides his name and falls asleep easily during the interview.  I reached out to his wife via phone and did not get her.     Intake/Output Summary (Last 24 hours) at 06/26/2023 0707 Last data filed at 06/25/2023 1635 Gross per 24 hour  Intake 100 ml  Output --  Net 100 ml    Vitals:  Vitals:   06/25/23 2103 06/25/23 2337 06/26/23 0210 06/26/23 0451  BP:  (!) 153/61 139/65 (!) 141/60  Pulse: 72  (!) 57 64  Resp:  18 17 17   Temp:  98.9 F (37.2 C) 98.9 F (37.2 C) 98.5 F (36.9 C)  TempSrc:  Oral Oral Oral  SpO2:  99% 100% 99%  Weight:      Height:         Physical Exam:  General elderly male in bed in no acute distress; appears younger  than stated age HEENT normocephalic atraumatic extraocular movements intact sclera anicteric Neck supple trachea midline Lungs clear to auscultation bilaterally normal work of breathing at rest  Heart S1S2 no rub Abdomen soft nontender nondistended; PD catheter intact Extremities no edema lower extremities Psych normal mood and affect Neuro - alert and oriented x 3 provides hx and follows commands   Medications reviewed   Labs:     Latest Ref Rng & Units 06/25/2023    7:18 AM 06/24/2023    6:18 PM 05/31/2023    4:46 AM  BMP  Glucose 70 - 99 mg/dL 130  865  784   BUN 8 - 23 mg/dL 54  50  43   Creatinine 0.61 - 1.24 mg/dL 6.96  2.95  2.84   Sodium 135 - 145 mmol/L 136  133  138   Potassium 3.5 - 5.1 mmol/L 2.8  3.3  3.5   Chloride 98 - 111 mmol/L 103  97  105   CO2 22 - 32 mmol/L 20  22  21    Calcium 8.9 - 10.3 mg/dL 8.2  8.6  8.6      Assessment/Plan:   # End-stage renal disease - On peritoneal dialysis - Will continue PD tonight  with all 2.5% dianeal solution . - Await today's labs - resumed lasix  40 mg BID - he does still make urine - Will need to obtain his PD prescription next week once his unit is open   # Fever/chills - Concerning for UTI - Antibiotics per primary team - Cell count acceptable from PD fluid   # Hypertension - optimize volume status with PD - continue home regimen     # Hypokalemia - repleted with 40 meq PO once on 5/24 - await AM labs   # Anemia of CKD - Will need to determine date of last outpatient ESA administration   # Metabolic bone disease/secondary hyperparathyroidism - calcitriol  1 mcg daily per home med list    Disposition - per primary team   Nan Aver, MD 06/26/2023 7:22 AM

## 2023-06-26 NOTE — Progress Notes (Signed)
   06/26/23 2036  BiPAP/CPAP/SIPAP  Reason BIPAP/CPAP not in use Non-compliant (PT states does not wear CPAP at home and does not want to wear here)

## 2023-06-27 DIAGNOSIS — A499 Bacterial infection, unspecified: Secondary | ICD-10-CM

## 2023-06-27 DIAGNOSIS — Z1612 Extended spectrum beta lactamase (ESBL) resistance: Secondary | ICD-10-CM | POA: Diagnosis not present

## 2023-06-27 LAB — RENAL FUNCTION PANEL
Albumin: 2.4 g/dL — ABNORMAL LOW (ref 3.5–5.0)
Anion gap: 8 (ref 5–15)
BUN: 56 mg/dL — ABNORMAL HIGH (ref 8–23)
CO2: 21 mmol/L — ABNORMAL LOW (ref 22–32)
Calcium: 8.2 mg/dL — ABNORMAL LOW (ref 8.9–10.3)
Chloride: 110 mmol/L (ref 98–111)
Creatinine, Ser: 4.05 mg/dL — ABNORMAL HIGH (ref 0.61–1.24)
GFR, Estimated: 14 mL/min — ABNORMAL LOW (ref >60)
Glucose, Bld: 99 mg/dL (ref 70–99)
Phosphorus: 3.2 mg/dL (ref 2.5–4.6)
Potassium: 2.8 mmol/L — ABNORMAL LOW (ref 3.5–5.1)
Sodium: 139 mmol/L (ref 135–145)

## 2023-06-27 LAB — GLUCOSE, CAPILLARY
Glucose-Capillary: 38 mg/dL — CL (ref 70–99)
Glucose-Capillary: 112 mg/dL — ABNORMAL HIGH (ref 70–99)
Glucose-Capillary: 62 mg/dL — ABNORMAL LOW (ref 70–99)
Glucose-Capillary: 119 mg/dL — ABNORMAL HIGH (ref 70–99)
Glucose-Capillary: 160 mg/dL — ABNORMAL HIGH (ref 70–99)
Glucose-Capillary: 98 mg/dL (ref 70–99)
Glucose-Capillary: 182 mg/dL — ABNORMAL HIGH (ref 70–99)

## 2023-06-27 LAB — URINE CULTURE: Culture: >=100000 — AB

## 2023-06-27 LAB — CULTURE, BLOOD (ROUTINE X 2): Special Requests: ADEQUATE

## 2023-06-27 LAB — HEMOGLOBIN A1C
Hgb A1c MFr Bld: 8.6 % — ABNORMAL HIGH (ref 4.8–5.6)
Mean Plasma Glucose: 200.12 mg/dL

## 2023-06-27 LAB — MAGNESIUM: Magnesium: 1.7 mg/dL (ref 1.7–2.4)

## 2023-06-27 MED ORDER — DELFLEX-LC/2.5% DEXTROSE 394 MOSM/L IP SOLN: INTRAPERITONEAL | Status: DC

## 2023-06-27 MED ORDER — DEXTROSE 50 % IV SOLN
INTRAVENOUS | Status: AC
  Administered 2023-06-27: 50 mL
  Filled 2023-06-27: qty 50

## 2023-06-27 MED ORDER — DEXTROSE 50 % IV SOLN: 25.0000 mL | INTRAVENOUS | Status: DC | PRN

## 2023-06-27 MED ORDER — INSULIN ASPART 100 UNIT/ML IJ SOLN
0.0000 [IU] | Freq: Every day | INTRAMUSCULAR | Status: DC
  Administered 2023-06-28 – 2023-06-29 (×2): 3 [IU] via SUBCUTANEOUS
  Administered 2023-06-30: 2 [IU] via SUBCUTANEOUS

## 2023-06-27 MED ORDER — POTASSIUM CHLORIDE 20 MEQ PO PACK
40.0000 meq | PACK | ORAL | Status: AC
  Administered 2023-06-27 (×2): 40 meq via ORAL
  Filled 2023-06-27 (×2): qty 2

## 2023-06-27 MED ORDER — INSULIN ASPART 100 UNIT/ML IJ SOLN
0.0000 [IU] | Freq: Three times a day (TID) | INTRAMUSCULAR | Status: DC
  Administered 2023-06-28: 1 [IU] via SUBCUTANEOUS
  Administered 2023-06-28: 3 [IU] via SUBCUTANEOUS
  Administered 2023-06-28: 1 [IU] via SUBCUTANEOUS
  Administered 2023-06-29: 3 [IU] via SUBCUTANEOUS
  Administered 2023-06-29: 1 [IU] via SUBCUTANEOUS
  Administered 2023-06-29: 2 [IU] via SUBCUTANEOUS
  Administered 2023-06-30 (×2): 1 [IU] via SUBCUTANEOUS
  Administered 2023-06-30: 2 [IU] via SUBCUTANEOUS
  Administered 2023-07-01: 1 [IU] via SUBCUTANEOUS

## 2023-06-27 MED ORDER — DEXTROSE 50 % IV SOLN: 50.0000 mL | INTRAVENOUS | Status: DC | PRN

## 2023-06-27 MED ORDER — MAGNESIUM SULFATE 2 GM/50ML IV SOLN
2.0000 g | Freq: Once | INTRAVENOUS | Status: AC
  Administered 2023-06-27: 2 g via INTRAVENOUS
  Filled 2023-06-27: qty 50

## 2023-06-27 NOTE — Progress Notes (Signed)
 PD tx initation note:   Pre TX VS:   Pre TX weight:   PD treatment initiated via aseptic technique. Consent signed and in chart. Patient is alert and oriented. No complaints of pain. No specimen collected. PD exit site clean, dry and intact. Gentamycin and new dressing applied. Bedside RN educated on PD machine and how to contact tech support when PD machine alarms.PD tx initation note:     06/26/23 2012  Peritoneal Catheter Mid lower abdomen  No placement date or time found.   Catheter Location: Mid lower abdomen  Site Assessment Clean, Dry, Intact  Drainage Description None  Catheter status Accessed  Dressing Gauze/Drain sponge  Dressing Status Clean, Dry, Intact  Dressing Intervention New dressing/dressing changed  Cycler Setup  Total Number of Night Cycles 4  Night Fill Volume 2000  Dianeal Solution Dextrose  2.5% in 6000 mL Low Cal/Low Mag  Night Dwell Time per Cycle - Hour(s) 1  Night Dwell Time per Cycle - Minute(s) 30  Night Time Therapy - Minute(s) 57  Night Time Therapy - Hour(s) 7  Minimum Initial Drain Volume 0  Maximum Peritoneal Volume 3000  Night/Total Therapy Volume 8000  Day Exchange No  Completion  Treatment Status Started  Hand-off documentation  Hand-off Given Given to shift RN/LPN  Report given to (Full Name) Dala Dublin, RN

## 2023-06-27 NOTE — Progress Notes (Signed)
 Hypoglycemia History insulin -dependent of DM type II Overnight patient was hypoglycemic blood glucose dropped to 38.  Ordered D50 amp as needed. Per chart review patient's previous A1c 7.9 in September 2024.  Currently patient is on Lantus  40 unit twice daily. -Rechecking A1c level. -Holding long-acting insulin  in setting of hypoglycemia. -Initiating low sliding scale insulin  with mealtime coverage.

## 2023-06-27 NOTE — Progress Notes (Signed)
 PD tx initation note:   Pre TX VS:   Pre TX weight:   PD treatment initiated via aseptic technique. Consent signed and in chart. Patient is alert and oriented. No complaints of pain. No specimen collected. PD exit site clean, dry and intact. Gentamycin and new dressing applied. Bedside RN educated on PD machine and how to contact tech support when PD machine alarms.PD tx initation note:    06/27/23 1807  Peritoneal Catheter Mid lower abdomen  No placement date or time found.   Catheter Location: Mid lower abdomen  Site Assessment Clean, Dry, Intact  Drainage Description None  Catheter status Accessed  Dressing Gauze/Drain sponge  Dressing Status Clean, Dry, Intact  Dressing Intervention New dressing/dressing changed  Cycler Setup  Total Number of Night Cycles 4  Night Fill Volume 2500  Dianeal Solution Dextrose  2.5% in 6000 mL Low Cal/Low Mag  Night Dwell Time per Cycle - Hour(s) 1  Night Dwell Time per Cycle - Minute(s) 30  Night Time Therapy - Minute(s) 57  Night Time Therapy - Hour(s) 7  Minimum Initial Drain Volume 0  Maximum Peritoneal Volume 3000  Night/Total Therapy Volume 8000  Day Exchange No  Completion  Treatment Status Started  Hand-off documentation  Hand-off Given Given to shift RN/LPN  Report given to (Full Name) Isaias March, LPN  Hand-off Received Received from shift RN/LPN  Report received from (Full Name) Shaquina Gillham, RN

## 2023-06-27 NOTE — Progress Notes (Addendum)
 Pt CBG 62. Made MD aware. Pt currently eating breakfast and gave juice. Will recheck in 15 minutes.  CBG rechecked in 15 minutes now 119. MD aware.  MD would like to start calorie count with pt to see how much food he is consuming due to CBG dropping. Also ask CBG be checked every 4 hours. Also ask to hold insulin  for today.

## 2023-06-27 NOTE — Progress Notes (Signed)
 [facility] Progress Note  Subjective:  Seen in room, no new c/o's  Vitals:   06/26/23 1632 06/26/23 2105 06/27/23 0519 06/27/23 0756  BP: (!) 160/80 (!) 194/79 (!) 143/54 (!) 155/66  Pulse: 79 71 66 73  Resp: 18  16 18   Temp: 98.1 F (36.7 C) 98.3 F (36.8 C) 98.2 F (36.8 C) 98.5 F (36.9 C)  TempSrc: Oral Oral    SpO2: 100% 100% 95% 99%  Weight:      Height:        Exam: General elderly male in bed in no acute distress Lungs clear to auscultation bilaterally  Heart S1S2 no rub Abdomen soft nontender nondistended; PD catheter intact Extremities no edema lower extremities Neuro - alert and oriented x 3 provides hx and follows commands     OP PD: [facility]    4 cycles, 2.5 L fill volume, usually uses 2.5% fluids  Usual wt ~ 97kg    Assessment/ Plan: Fever/chills: concerning for UTI, abx per pmd.  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 Plan PD nightly while here.  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   No results for input(s): "IRON", "TIBC", "FERRITIN" in the last 168 hours. Inpatient medications:  aspirin  EC  81 mg Oral q morning   calcitRIOL   1 mcg Oral Daily   ezetimibe   10 mg Oral Daily   gabapentin   100 mg Oral Daily   gabapentin   200 mg Oral QHS   gentamicin  cream  1 Application Topical Daily   gentamicin  cream  1 Application Topical Daily   heparin    5,000 Units Subcutaneous Q8H   hydrALAZINE   100 mg Oral TID   insulin  aspart  0-5 Units Subcutaneous QHS   insulin  aspart  0-6 Units Subcutaneous TID WC   metoprolol  tartrate  100 mg Oral Daily   metoprolol  tartrate  75 mg Oral QHS   pantoprazole   40 mg Oral Daily   potassium chloride   40 mEq Oral Q4H   sodium chloride  flush  3 mL Intravenous Q12H   tamsulosin   0.4 mg Oral QHS    dialysis solution 1.5% low-MG/low-CA     dialysis solution 2.5% low-MG/low-CA     magnesium  sulfate bolus IVPB 2 g (06/27/23 0920)   meropenem  (MERREM ) IV 500 mg (06/27/23 0544)   acetaminophen , albuterol , dextrose , melatonin, polyethylene glycol

## 2023-06-27 NOTE — Progress Notes (Signed)
 PD post treatment note  PD treatment completed. Patient tolerated treatment well. PD effluent is clear. No specimen collected.  PD exit site clean, dry and intact. Patient is awake, oriented and in no acute distress.  Report given to bedside nurse.   Post treatment VS:   Total UF removed:  396 ml   Post treatment weight: unable to obtain.    06/27/23 0452  Peritoneal Catheter Mid lower abdomen  No placement date or time found.   Catheter Location: Mid lower abdomen  Site Assessment Clean, Dry, Intact  Drainage Description None  Catheter status Deaccessed  Dressing Gauze/Drain sponge  Dressing Status Clean, Dry, Intact  Dressing Intervention Assessed, no intervention needed  Completion  Treatment Status Complete  Initial Drain Volume 43  Average Dwell Time-Hour(s) 1  Average Dwell Time-Min(s) 30  Average Drain Time 34  Total Therapy Volume 8001  Total Therapy Time-Hour(s) 8  Total Therapy Time-Min(s) 56  Effluent Appearance Amber;Clear  Fluid Balance - CCPD  Total UF (+ value on cycler, pt loss) 396 mL  Procedure Comments  Tolerated treatment well? Yes  Peritoneal Dialysis Comments tx completed asexpected, tolerated well, no complaints.  Hand-off documentation  Hand-off Given Given to Transfer Unit/facility  Report given to (Full Name) Dala Dublin, RN  Hand-off Received Received from shift RN/LPN  Report received from (Full Name) Paizlie Klaus, RN

## 2023-06-27 NOTE — Progress Notes (Signed)
 PROGRESS NOTE    Carlos Gill  ONG:[id number] DOB: 10/30/1940 DOA: 06/24/2023 PCP: Suan Elm, MD  Outpatient Specialists:     Brief Narrative:  Patient is an 83 year old male with past medical history significant for recent admission for UTI secondary to E. coli ESBL (05/26/2023 to 05/30/2023), end-stage renal disease on peritoneal dialysis, hypertension, hyperlipidemia, type 2 diabetes mellitus and OSA on CPAP.  As per prior documentation, patient completed meropenem  about 06/05/2023.  2 days prior to presentation, patient developed subjective fever and chills.  Blood and urine cultures done on presentation grew E. coli ESBL.  CT scan of the abdomen and pelvis was nonrevealing.  Infectious disease team was consulted.  ID team recommended repeating blood culture and obtaining an echocardiogram.  06/27/2023: Echocardiogram is negative for valvular vegetation.  RVSP of 59.6 mmHg was reported.  Repeat blood culture is pending.  Patient is on IV meropenem    Assessment & Plan:   Principal Problem:   ESBL (extended spectrum beta-lactamase) producing bacteria infection Active Problems:   Sepsis (HCC)   Bacteremia   Relapsed urinary tract infection secondary to ESBL E. coli: Sepsis/bacteremia secondary to above, present on admission, improving: History recent ESBL E. Coli UTI: -Patient presented with 2-day history of subjective fever and chills. -On initial evaluation, patient was febrile (temperature of 38.5 C), tachycardic in the low 100s.  Exam benign, no CVA tenderness.  WBC 7.  Lactate 1.6 -> 1.  CT abdomen pelvis without contrast does have nonspecific perinephric stranding; no acute abnormality noted.  Suspect relapse UTI with possible ESBL species - Patient has end-stage renal disease on peritoneal dialysis - Urine and blood cultures have grown E. coli ESBL. - Infectious disease team consulted.  Repeat blood culture and echocardiogram were advised by the ID team.  Echocardiogram  revealed RVSP of 59.6, otherwise, no valvular lesions.  Repeat blood cultures pending.  Patient remains on IV meropenem .     Incidental findings:  Trace pericardial effusion  Aortic atherosclerosis    Chronic medical problems: ESRD on PD:  -Nephrology team is managing.  Hypokalemia: -Potassium of 2.8. - Magnesium  level of 1.7 today - KCl 40 mEq p.o. every 4 hours x 2 doses. -IV magnesium  sulfate 2 g x 1 dose. - Continue to monitor renal function and electrolytes.  Diabetes type 2 with hyperglycemia: Hyperglycemia: - Patient was hypoglycemic overnight.  Blood sugar dropped as low as 38 overnight. - Long-acting insulin  has been discontinued.   - Will also hold sliding scale insulin  for now. - Continue to monitor the blood sugar closely.    Hypertension:  - Reasonably controlled. - Continue home hydralazine  100 mg 3 times daily, metoprolol  100/75 mg AM/PM  Hyperlipidemia:  -Continue home Zetia   OSA:  -CPAP nightly  BPH: - Continue tamsulosin    DVT prophylaxis: Subcutaneous heparin . Code Status: Full code. Family Communication:  Disposition Plan: Patient remains inpatient.   Consultants:  Infectious disease  Procedures:  None.  Antimicrobials:  IV meropenem .   Subjective: No new complaints. No fever or chills.  Objective: Vitals:   06/26/23 1538 06/26/23 1632 06/26/23 2105 06/27/23 0519  BP: (!) 161/84 (!) 160/80 (!) 194/79 (!) 143/54  Pulse:  79 71 66  Resp:  18  16  Temp:  98.1 F (36.7 C) 98.3 F (36.8 C) 98.2 F (36.8 C)  TempSrc:  Oral Oral   SpO2:  100% 100% 95%  Weight:      Height:        Intake/Output Summary (Last 24 hours)  at 06/27/2023 0724 Last data filed at 06/27/2023 0452 Gross per 24 hour  Intake --  Output 396 ml  Net -396 ml   Filed Weights   06/24/23 1823 06/25/23 1751  Weight: 98.4 kg 101.5 kg    Examination:  General exam: Appears calm and comfortable  Respiratory system: Clear to auscultation.  Cardiovascular  system: S1 & S2 heard Gastrointestinal system: Abdomen is soft and nontender.  Central nervous system: Alert and oriented.  Moves all extremities.   Data Reviewed: I have personally reviewed following labs and imaging studies  CBC: Recent Labs  Lab 06/24/23 1818 06/25/23 0815  WBC 7.1 8.4  NEUTROABS 5.5 6.9  HGB 10.9* 9.8*  HCT 33.8* 30.0*  MCV 83.5 83.3  PLT 161 134*   Basic Metabolic Panel: Recent Labs  Lab 06/24/23 1818 06/25/23 0718 06/25/23 0815 06/26/23 0735 06/26/23 1304 06/27/23 0510  NA 133* 136  --  136  --  139  K 3.3* 2.8*  --  2.6* 3.2* 2.8*  CL 97* 103  --  106  --  110  CO2 22 20*  --  22  --  21*  GLUCOSE 429* 199*  --  59*  --  99  BUN 50* 54*  --  54*  --  56*  CREATININE 4.49* 4.38*  --  4.16*  --  4.05*  CALCIUM 8.6* 8.2*  --  7.9*  --  8.2*  MG  --   --  1.5*  --   --  1.7  PHOS  --  3.6  --  3.4  --  3.2   GFR: Estimated Creatinine Clearance: 17 mL/min (A) (by C-G formula based on SCr of 4.05 mg/dL (H)). Liver Function Tests: Recent Labs  Lab 06/25/23 0718 06/26/23 0735 06/27/23 0510  ALBUMIN 2.7* 2.4* 2.4*   No results for input(s): "LIPASE", "AMYLASE" in the last 168 hours. No results for input(s): "AMMONIA" in the last 168 hours. Coagulation Profile: No results for input(s): "INR", "PROTIME" in the last 168 hours. Cardiac Enzymes: No results for input(s): "CKTOTAL", "CKMB", "CKMBINDEX", "TROPONINI" in the last 168 hours. BNP (last 3 results) No results for input(s): "PROBNP" in the last 8760 hours. HbA1C: Recent Labs    06/27/23 0510  HGBA1C 8.6*   CBG: Recent Labs  Lab 06/26/23 1227 06/26/23 1632 06/26/23 2052 06/27/23 0406 06/27/23 0441  GLUCAP 128* 61* 137* 38* 112*   Lipid Profile: No results for input(s): "CHOL", "HDL", "LDLCALC", "TRIG", "CHOLHDL", "LDLDIRECT" in the last 72 hours. Thyroid Function Tests: No results for input(s): "TSH", "T4TOTAL", "FREET4", "T3FREE", "THYROIDAB" in the last 72 hours. Anemia  Panel: No results for input(s): "VITAMINB12", "FOLATE", "FERRITIN", "TIBC", "IRON", "RETICCTPCT" in the last 72 hours. Urine analysis:    Component Value Date/Time   COLORURINE YELLOW 06/24/2023 2258   APPEARANCEUR CLOUDY (A) 06/24/2023 2258   LABSPEC 1.009 06/24/2023 2258   PHURINE 5.0 06/24/2023 2258   GLUCOSEU >=500 (A) 06/24/2023 2258   HGBUR SMALL (A) 06/24/2023 2258   BILIRUBINUR NEGATIVE 06/24/2023 2258   KETONESUR NEGATIVE 06/24/2023 2258   PROTEINUR 100 (A) 06/24/2023 2258   UROBILINOGEN 0.2 05/19/2014 1847   NITRITE NEGATIVE 06/24/2023 2258   LEUKOCYTESUR LARGE (A) 06/24/2023 2258   Sepsis Labs: @LABRCNTIP (procalcitonin:4,lacticidven:4)  ) Recent Results (from the past 240 hours)  Blood culture (routine x 2)     Status: None (Preliminary result)   Collection Time: 06/24/23 10:45 PM   Specimen: BLOOD  Result Value Ref Range Status   Specimen Description  BLOOD RIGHT ANTECUBITAL  Final   Special Requests   Final    BOTTLES DRAWN AEROBIC AND ANAEROBIC Blood Culture adequate volume   Culture  Setup Time   Final    GRAM NEGATIVE RODS AEROBIC BOTTLE ONLY CRITICAL VALUE NOTED.  VALUE IS CONSISTENT WITH PREVIOUSLY REPORTED AND CALLED VALUE. Performed at [facility], [address] [place], [place] [address]    Culture GRAM NEGATIVE RODS  Final   Report Status PENDING  Incomplete  Blood culture (routine x 2)     Status: Abnormal (Preliminary result)   Collection Time: 06/24/23 10:58 PM   Specimen: BLOOD RIGHT HAND  Result Value Ref Range Status   Specimen Description BLOOD RIGHT HAND  Final   Special Requests   Final    BOTTLES DRAWN AEROBIC AND ANAEROBIC Blood Culture adequate volume   Culture  Setup Time   Final    GRAM NEGATIVE RODS AEROBIC BOTTLE ONLY CRITICAL RESULT CALLED TO, READ BACK BY AND VERIFIED WITH: Pharmd Nathan G on [id number] @1422  by SM    Culture (A)  Final    ESCHERICHIA COLI SUSCEPTIBILITIES TO FOLLOW Performed at [facility],  [address], [place], [place] [address]    Report Status PENDING  Incomplete  Urine Culture     Status: Abnormal (Preliminary result)   Collection Time: 06/24/23 10:58 PM   Specimen: Urine, Random  Result Value Ref Range Status   Specimen Description URINE, RANDOM  Final   Special Requests NONE Reflexed from [id number]  Final   Culture (A)  Final    >=100,000 COLONIES/mL ESCHERICHIA COLI SUSCEPTIBILITIES TO FOLLOW Performed at [facility], [address] [place], [place] [address]    Report Status PENDING  Incomplete  Blood Culture ID Panel (Reflexed)     Status: Abnormal   Collection Time: 06/24/23 10:58 PM  Result Value Ref Range Status   Enterococcus faecalis NOT DETECTED NOT DETECTED Final   Enterococcus Faecium NOT DETECTED NOT DETECTED Final   Listeria monocytogenes NOT DETECTED NOT DETECTED Final   Staphylococcus species NOT DETECTED NOT DETECTED Final   Staphylococcus aureus (BCID) NOT DETECTED NOT DETECTED Final   Staphylococcus epidermidis NOT DETECTED NOT DETECTED Final   Staphylococcus lugdunensis NOT DETECTED NOT DETECTED Final   Streptococcus species NOT DETECTED NOT DETECTED Final   Streptococcus agalactiae NOT DETECTED NOT DETECTED Final   Streptococcus pneumoniae NOT DETECTED NOT DETECTED Final   Streptococcus pyogenes NOT DETECTED NOT DETECTED Final   A.calcoaceticus-baumannii NOT DETECTED NOT DETECTED Final   Bacteroides fragilis NOT DETECTED NOT DETECTED Final   Enterobacterales DETECTED (A) NOT DETECTED Final    Comment: Enterobacterales represent a large order of gram negative bacteria, not a single organism. CRITICAL RESULT CALLED TO, READ BACK BY AND VERIFIED WITH: Pharmd Nathan G on [id number] @1422  by SM    Enterobacter cloacae complex NOT DETECTED NOT DETECTED Final   Escherichia coli DETECTED (A) NOT DETECTED Final    Comment: CRITICAL RESULT CALLED TO, READ BACK BY AND VERIFIED WITH: Alvina Axon on [phone] @1422  by SM    Klebsiella aerogenes NOT  DETECTED NOT DETECTED Final   Klebsiella oxytoca NOT DETECTED NOT DETECTED Final   Klebsiella pneumoniae NOT DETECTED NOT DETECTED Final   Proteus species NOT DETECTED NOT DETECTED Final   Salmonella species NOT DETECTED NOT DETECTED Final   Serratia marcescens NOT DETECTED NOT DETECTED Final   Haemophilus influenzae NOT DETECTED NOT DETECTED Final   Neisseria meningitidis NOT DETECTED  NOT DETECTED Final   Pseudomonas aeruginosa NOT DETECTED NOT DETECTED Final   Stenotrophomonas maltophilia NOT DETECTED NOT DETECTED Final   Candida albicans NOT DETECTED NOT DETECTED Final   Candida auris NOT DETECTED NOT DETECTED Final   Candida glabrata NOT DETECTED NOT DETECTED Final   Candida krusei NOT DETECTED NOT DETECTED Final   Candida parapsilosis NOT DETECTED NOT DETECTED Final   Candida tropicalis NOT DETECTED NOT DETECTED Final   Cryptococcus neoformans/gattii NOT DETECTED NOT DETECTED Final   CTX-M ESBL DETECTED (A) NOT DETECTED Final    Comment: CRITICAL RESULT CALLED TO, READ BACK BY AND VERIFIED WITH: Pharmd Nathan G on [id number] @1422  by SM (NOTE) Extended spectrum beta-lactamase detected. Recommend a carbapenem as initial therapy.      Carbapenem resistance IMP NOT DETECTED NOT DETECTED Final   Carbapenem resistance KPC NOT DETECTED NOT DETECTED Final   Carbapenem resistance NDM NOT DETECTED NOT DETECTED Final   Carbapenem resist OXA 48 LIKE NOT DETECTED NOT DETECTED Final   Carbapenem resistance VIM NOT DETECTED NOT DETECTED Final    Comment: Performed at [facility], [address], [place], [place] [address]  Body fluid culture w Gram Stain     Status: None (Preliminary result)   Collection Time: 06/24/23 11:48 PM   Specimen: Peritoneal Dialysate; Body Fluid  Result Value Ref Range Status   Specimen Description PERITONEAL DIALYSATE  Final   Special Requests NONE  Final   Gram Stain NO WBC SEEN NO ORGANISMS SEEN   Final   Culture   Final    NO GROWTH 1 DAY Performed  at [facility], [address] [place], [place] [address]    Report Status PENDING  Incomplete  Resp panel by RT-PCR (RSV, Flu A&B, Covid) Anterior Nasal Swab     Status: None   Collection Time: 06/25/23 12:09 AM   Specimen: Anterior Nasal Swab  Result Value Ref Range Status   SARS Coronavirus 2 by RT PCR NEGATIVE NEGATIVE Final   Influenza A by PCR NEGATIVE NEGATIVE Final   Influenza B by PCR NEGATIVE NEGATIVE Final    Comment: (NOTE) The Xpert Xpress SARS-CoV-2/FLU/RSV plus assay is intended as an aid in the diagnosis of influenza from Nasopharyngeal swab specimens and should not be used as a sole basis for treatment. Nasal washings and aspirates are unacceptable for Xpert Xpress SARS-CoV-2/FLU/RSV testing.  Fact Sheet for Patients: BloggerCourse.com  Fact Sheet for Healthcare Providers: SeriousBroker.it  This test is not yet approved or cleared by the [place]  FDA and has been authorized for detection and/or diagnosis of SARS-CoV-2 by FDA under an Emergency Use Authorization (EUA). This EUA will remain in effect (meaning this test can be used) for the duration of the COVID-19 declaration under Section 564(b)(1) of the Act, 21 U.S.C. section 360bbb-3(b)(1), unless the authorization is terminated or revoked.     Resp Syncytial Virus by PCR NEGATIVE NEGATIVE Final    Comment: (NOTE) Fact Sheet for Patients: BloggerCourse.com  Fact Sheet for Healthcare Providers: SeriousBroker.it  This test is not yet approved or cleared by the [place]  FDA and has been authorized for detection and/or diagnosis of SARS-CoV-2 by FDA under an Emergency Use Authorization (EUA). This EUA will remain in effect (meaning this test can be used) for the duration of the COVID-19 declaration under Section 564(b)(1) of the Act, 21 U.S.C. section 360bbb-3(b)(1), unless the authorization  is terminated or revoked.  Performed at [facility], [address], [place],  [place] [address]          Radiology Studies: ECHOCARDIOGRAM COMPLETE Result Date: 06/26/2023    ECHOCARDIOGRAM REPORT   Patient Name:   Carlos Gill Date of Exam: 06/26/2023 Medical Rec #:  [id number]    Height:       72.0 in Accession #:    [id number]   Weight:       223.8 lb Date of Birth:  1940-05-23    BSA:          2.235 m Patient Age:    83 years     BP:           161/84 mmHg Patient Gender: M            HR:           76 bpm. Exam Location:  Inpatient Procedure: 2D Echo, Cardiac Doppler and Color Doppler (Both Spectral and Color            Flow Doppler were utilized during procedure). Indications:    Bacteremia R78.81  History:        Patient has prior history of Echocardiogram examinations, most                 recent 10/18/2022. CHF, CKD, stage 5, Signs/Symptoms:Chest Pain                 and Shortness of Breath; Risk Factors:Hypertension, Sleep Apnea,                 Diabetes and Dyslipidemia.  Sonographer:    Terrilee Few RCS Referring Phys: [id number] Mikalia Fessel I Lenah Messenger IMPRESSIONS  1. Left ventricular ejection fraction, by estimation, is 50 to 55%. The left ventricle has low normal function. The left ventricle demonstrates regional wall motion abnormalities. Abnormal (paradoxical) septal motion, consistent with left bundle branch block.     There is moderate left ventricular hypertrophy. Left ventricular diastolic parameters are consistent with Grade I diastolic dysfunction (impaired relaxation).  2. Right ventricular systolic function is normal. The right ventricular size is normal. There is moderately elevated pulmonary artery systolic pressure. The estimated right ventricular systolic pressure is 59.6 mmHg.  3. The mitral valve is normal in structure. Mild mitral valve regurgitation.  4. The aortic valve is tricuspid. Aortic valve regurgitation is trivial. No aortic stenosis is present.  5. The inferior vena  cava is dilated in size with <50% respiratory variability, suggesting right atrial pressure of 15 mmHg. Conclusion(s)/Recommendation(s): No evidence of valvular vegetations on this transthoracic echocardiogram. Consider a transesophageal echocardiogram to exclude infective endocarditis if clinically indicated. FINDINGS  Left Ventricle: Left ventricular ejection fraction, by estimation, is 50 to 55%. The left ventricle has low normal function. The left ventricle demonstrates regional wall motion abnormalities. The left ventricular internal cavity size was normal in size. There is moderate left ventricular hypertrophy. Abnormal (paradoxical) septal motion, consistent with left bundle branch block. Left ventricular diastolic parameters are consistent with Grade I diastolic dysfunction (impaired relaxation). Right Ventricle: The right ventricular size is normal. No increase in right ventricular wall thickness. Right ventricular systolic function is normal. There is moderately elevated pulmonary artery systolic pressure. The tricuspid regurgitant velocity is 3.34 m/s, and with an assumed right atrial pressure of 15 mmHg, the estimated right ventricular systolic pressure is 59.6 mmHg. Left Atrium: Left atrial size was normal in size. Right Atrium: Right atrial size was normal in size. Pericardium: Trivial pericardial effusion is present. Mitral Valve: The mitral valve is normal in structure. Mild  mitral valve regurgitation. Tricuspid Valve: The tricuspid valve is normal in structure. Tricuspid valve regurgitation is mild. Aortic Valve: The aortic valve is tricuspid. Aortic valve regurgitation is trivial. No aortic stenosis is present. Aortic valve mean gradient measures 7.5 mmHg. Aortic valve peak gradient measures 14.0 mmHg. Aortic valve area, by VTI measures 2.79 cm. Pulmonic Valve: The pulmonic valve was not well visualized. Pulmonic valve regurgitation is trivial. Aorta: The aortic root and ascending aorta are  structurally normal, with no evidence of dilitation. Venous: The inferior vena cava is dilated in size with less than 50% respiratory variability, suggesting right atrial pressure of 15 mmHg. IAS/Shunts: The atrial septum is grossly normal.  LEFT VENTRICLE PLAX 2D LVIDd:         4.80 cm      Diastology LVIDs:         3.60 cm      LV e' medial:    8.70 cm/s LV PW:         1.20 cm      LV E/e' medial:  10.7 LV IVS:        1.35 cm      LV e' lateral:   8.38 cm/s LVOT diam:     2.10 cm      LV E/e' lateral: 11.1 LV SV:         104 LV SV Index:   46 LVOT Area:     3.46 cm  LV Volumes (MOD) LV vol d, MOD A2C: 176.0 ml LV vol d, MOD A4C: 176.0 ml LV vol s, MOD A2C: 78.3 ml LV vol s, MOD A4C: 89.1 ml LV SV MOD A2C:     97.7 ml LV SV MOD A4C:     176.0 ml LV SV MOD BP:      89.0 ml RIGHT VENTRICLE             IVC RV S prime:     15.00 cm/s  IVC diam: 2.50 cm TAPSE (M-mode): 4.0 cm LEFT ATRIUM             Index        RIGHT ATRIUM           Index LA diam:        4.30 cm 1.92 cm/m   RA Area:     22.80 cm LA Vol (A2C):   67.3 ml 30.12 ml/m  RA Volume:   63.10 ml  28.24 ml/m LA Vol (A4C):   70.1 ml 31.37 ml/m LA Biplane Vol: 71.7 ml 32.08 ml/m  AORTIC VALVE AV Area (Vmax):    2.76 cm AV Area (Vmean):   2.73 cm AV Area (VTI):     2.79 cm AV Vmax:           187.00 cm/s AV Vmean:          122.000 cm/s AV VTI:            0.373 m AV Peak Grad:      14.0 mmHg AV Mean Grad:      7.5 mmHg LVOT Vmax:         149.00 cm/s LVOT Vmean:        96.100 cm/s LVOT VTI:          0.300 m LVOT/AV VTI ratio: 0.80  AORTA Ao Root diam: 3.10 cm Ao Asc diam:  3.30 cm MITRAL VALVE                TRICUSPID VALVE MV Area (  PHT): 3.60 cm     TR Peak grad:   44.6 mmHg MV Decel Time: 211 msec     TR Vmax:        334.00 cm/s MV E velocity: 92.80 cm/s MV A velocity: 132.00 cm/s  SHUNTS MV E/A ratio:  0.70         Systemic VTI:  0.30 m                             Systemic Diam: 2.10 cm Carson Clara MD Electronically signed by Carson Clara MD Signature Date/Time: 06/26/2023/6:52:09 PM    Final         Scheduled Meds:  aspirin  EC  81 mg Oral q morning   calcitRIOL   1 mcg Oral Daily   ezetimibe   10 mg Oral Daily   gabapentin   100 mg Oral Daily   gabapentin   200 mg Oral QHS   gentamicin  cream  1 Application Topical Daily   gentamicin  cream  1 Application Topical Daily   heparin   5,000 Units Subcutaneous Q8H   hydrALAZINE   100 mg Oral TID   insulin  aspart  0-5 Units Subcutaneous QHS   insulin  aspart  0-6 Units Subcutaneous TID WC   metoprolol  tartrate  100 mg Oral Daily   metoprolol  tartrate  75 mg Oral QHS   pantoprazole   40 mg Oral Daily   potassium chloride   40 mEq Oral Q4H   sodium chloride  flush  3 mL Intravenous Q12H   tamsulosin   0.4 mg Oral QHS   Continuous Infusions:  dialysis solution 1.5% low-MG/low-CA     dialysis solution 2.5% low-MG/low-CA     magnesium  sulfate bolus IVPB     meropenem  (MERREM ) IV 500 mg (06/27/23 0544)     LOS: 3 days    Time spent: 35 minutes.    Fonnie Iba, MD  [facility] Pager #: [phone] 7PM-7AM contact night coverage as above

## 2023-06-28 ENCOUNTER — Encounter (HOSPITAL_COMMUNITY): Payer: Self-pay | Admitting: Internal Medicine

## 2023-06-28 DIAGNOSIS — A499 Bacterial infection, unspecified: Secondary | ICD-10-CM | POA: Diagnosis not present

## 2023-06-28 DIAGNOSIS — Z1612 Extended spectrum beta lactamase (ESBL) resistance: Secondary | ICD-10-CM | POA: Diagnosis not present

## 2023-06-28 DIAGNOSIS — B9620 Unspecified Escherichia coli [E. coli] as the cause of diseases classified elsewhere: Secondary | ICD-10-CM | POA: Diagnosis not present

## 2023-06-28 DIAGNOSIS — N186 End stage renal disease: Secondary | ICD-10-CM | POA: Diagnosis not present

## 2023-06-28 DIAGNOSIS — R7881 Bacteremia: Secondary | ICD-10-CM | POA: Diagnosis not present

## 2023-06-28 LAB — RENAL FUNCTION PANEL
Albumin: 2.3 g/dL — ABNORMAL LOW (ref 3.5–5.0)
Anion gap: 8 (ref 5–15)
BUN: 53 mg/dL — ABNORMAL HIGH (ref 8–23)
CO2: 22 mmol/L (ref 22–32)
Calcium: 8.4 mg/dL — ABNORMAL LOW (ref 8.9–10.3)
Chloride: 111 mmol/L (ref 98–111)
Creatinine, Ser: 3.88 mg/dL — ABNORMAL HIGH (ref 0.61–1.24)
GFR, Estimated: 15 mL/min — ABNORMAL LOW (ref >60)
Glucose, Bld: 159 mg/dL — ABNORMAL HIGH (ref 70–99)
Phosphorus: 2.8 mg/dL (ref 2.5–4.6)
Potassium: 3.1 mmol/L — ABNORMAL LOW (ref 3.5–5.1)
Sodium: 141 mmol/L (ref 135–145)

## 2023-06-28 LAB — GLUCOSE, CAPILLARY
Glucose-Capillary: 153 mg/dL — ABNORMAL HIGH (ref 70–99)
Glucose-Capillary: 155 mg/dL — ABNORMAL HIGH (ref 70–99)
Glucose-Capillary: 189 mg/dL — ABNORMAL HIGH (ref 70–99)
Glucose-Capillary: 270 mg/dL — ABNORMAL HIGH (ref 70–99)
Glucose-Capillary: 270 mg/dL — ABNORMAL HIGH (ref 70–99)
Glucose-Capillary: 284 mg/dL — ABNORMAL HIGH (ref 70–99)

## 2023-06-28 LAB — MAGNESIUM: Magnesium: 2.1 mg/dL (ref 1.7–2.4)

## 2023-06-28 LAB — BODY FLUID CULTURE W GRAM STAIN: Gram Stain: NONE SEEN

## 2023-06-28 LAB — CULTURE, BLOOD (ROUTINE X 2): Special Requests: ADEQUATE

## 2023-06-28 MED ORDER — NEPRO/CARBSTEADY PO LIQD
237.0000 mL | Freq: Two times a day (BID) | ORAL | Status: DC
  Administered 2023-06-29 – 2023-06-30 (×3): 237 mL via ORAL

## 2023-06-28 MED ORDER — RENA-VITE PO TABS
1.0000 | ORAL_TABLET | Freq: Every day | ORAL | Status: DC
  Administered 2023-06-29 – 2023-06-30 (×2): 1 via ORAL
  Filled 2023-06-28 (×2): qty 1

## 2023-06-28 MED ORDER — POTASSIUM CHLORIDE CRYS ER 20 MEQ PO TBCR
40.0000 meq | EXTENDED_RELEASE_TABLET | Freq: Once | ORAL | Status: AC
  Administered 2023-06-28: 40 meq via ORAL
  Filled 2023-06-28: qty 2

## 2023-06-28 MED ORDER — DELFLEX-LC/2.5% DEXTROSE 394 MOSM/L IP SOLN: INTRAPERITONEAL | Status: DC

## 2023-06-28 NOTE — Progress Notes (Signed)
 [facility] Progress Note  Subjective:  Seen in room In good spirits  Vitals:   06/27/23 2020 06/27/23 2122 06/28/23 0520 06/28/23 0835  BP: (!) 162/72 (!) 148/83 (!) 159/65 (!) 188/70  Pulse: 69 68 70 75  Resp: 18  17   Temp: 98.6 F (37 C)  98.1 F (36.7 C) 98.4 F (36.9 C)  TempSrc:    Oral  SpO2: 100%  100% 100%  Weight:      Height:        Exam: General elderly male in bed in no acute distress Lungs clear to auscultation bilaterally  Heart S1S2 no rub Abdomen soft nontender nondistended; PD catheter intact Extremities no edema lower extremities Neuro - alert and oriented x 3 provides hx and follows commands     OP PD: davita [place]    4 cycles, 2.5 L fill volume, usually uses 2.5% fluids  Usual wt ~ 97kg    Assessment/ Plan: Fever/chills: ESBL E.Coli, afebrile now, on IV meropenem .  ESRD: on PD w/ davita [place]. Continue PD nightly while here.  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  Secondary hyperparathyroidism: CCa/ phos stable, cont vdra/ binder   [name] MD  [facility] 06/28/2023, 10:17 AM  Recent Labs  Lab 06/24/23 1818 06/25/23 0718 06/25/23 0815 06/26/23 0735 06/27/23 0510 06/28/23 0450  HGB 10.9*  --  9.8*  --   --   --   ALBUMIN  --    < >  --    < > 2.4* 2.3*  CALCIUM 8.6*   < >  --    < > 8.2* 8.4*  PHOS  --    < >  --    < > 3.2 2.8  CREATININE 4.49*   < >  --    < > 4.05* 3.88*  K 3.3*   < >  --    < > 2.8* 3.1*   < > = values in this interval not displayed.   No results for input(s): "IRON", "TIBC", "FERRITIN" in the last 168 hours. Inpatient medications:  aspirin  EC  81 mg Oral q morning   calcitRIOL   1 mcg Oral Daily   ezetimibe   10 mg Oral Daily   gabapentin   100 mg Oral Daily   gabapentin   200 mg Oral QHS   gentamicin  cream  1 Application Topical Daily   gentamicin  cream  1 Application Topical Daily   heparin   5,000 Units  Subcutaneous Q8H   hydrALAZINE   100 mg Oral TID   insulin  aspart  0-5 Units Subcutaneous QHS   insulin  aspart  0-6 Units Subcutaneous TID WC   metoprolol  tartrate  100 mg Oral Daily   metoprolol  tartrate  75 mg Oral QHS   pantoprazole   40 mg Oral Daily   sodium chloride  flush  3 mL Intravenous Q12H   tamsulosin   0.4 mg Oral QHS    dialysis solution 1.5% low-MG/low-CA     dialysis solution 2.5% low-MG/low-CA     meropenem  (MERREM ) IV 500 mg (06/28/23 0512)   acetaminophen , albuterol , dextrose , melatonin, polyethylene glycol

## 2023-06-28 NOTE — Progress Notes (Signed)
 [facility]  Date of Admission:  06/24/2023     Abx: meropenem   ASSESSMENT: Esbl ecoli bacteremia Esrd on peritoneal dialysis   No clear source Tte negative  No sign of peritonitis   PLAN: Finish 7 day meropenem  Maintain contact isolation precaution If recurrent within the next several weeks can consider tagged wbc scan if no obvious source still Id will sign off Discussed with primary team   Principal Problem:   ESBL (extended spectrum beta-lactamase) producing bacteria infection Active Problems:   Sepsis (HCC)   Bacteremia   Allergies  Allergen Reactions   Statins Swelling and Other (See Comments)    Muscle aches   Wound Dressing Adhesive Dermatitis    Pt has rash on L arm from tape "used with stitches"    Scheduled Meds:  aspirin  EC  81 mg Oral q morning   calcitRIOL   1 mcg Oral Daily   ezetimibe   10 mg Oral Daily   [START ON 06/29/2023] feeding supplement (NEPRO CARB STEADY)  237 mL Oral BID BM   gabapentin   100 mg Oral Daily   gabapentin   200 mg Oral QHS   gentamicin  cream  1 Application Topical Daily   heparin   5,000 Units Subcutaneous Q8H   hydrALAZINE   100 mg Oral TID   insulin  aspart  0-5 Units Subcutaneous QHS   insulin  aspart  0-6 Units Subcutaneous TID WC   metoprolol  tartrate  100 mg Oral Daily   metoprolol  tartrate  75 mg Oral QHS   [START ON 06/29/2023] multivitamin  1 tablet Oral QHS   pantoprazole   40 mg Oral Daily   sodium chloride  flush  3 mL Intravenous Q12H   tamsulosin   0.4 mg Oral QHS   Continuous Infusions:  dialysis solution 2.5% low-MG/low-CA     meropenem  (MERREM ) IV Stopped (06/28/23 0542)   PRN Meds:.acetaminophen , albuterol , dextrose , melatonin, polyethylene glycol   SUBJECTIVE: No complaint  Review of Systems: ROS All other ROS was negative, except mentioned above     OBJECTIVE: Vitals:   06/27/23 2122 06/28/23 0520 06/28/23 0835 06/28/23 1520  BP: (!) 148/83 (!) 159/65  (!) 188/70   Pulse: 68 70 75   Resp:  17    Temp:  98.1 F (36.7 C) 98.4 F (36.9 C)   TempSrc:   Oral   SpO2:  100% 100%   Weight:    101 kg  Height:       Body mass index is 30.19 kg/m.  Physical Exam General/constitutional: no distress, pleasant HEENT: Normocephalic, PER, Conj Clear, EOMI, Oropharynx clear Neck supple CV: rrr no mrg Lungs: clear to auscultation, normal respiratory effort Abd: soft, nt; right lower quadrant dialysis catheter exit site no purulence/tenderness   Lab Results Lab Results  Component Value Date   WBC 8.4 06/25/2023   HGB 9.8 (L) 06/25/2023   HCT 30.0 (L) 06/25/2023   MCV 83.3 06/25/2023   PLT 134 (L) 06/25/2023    Lab Results  Component Value Date   CREATININE 3.88 (H) 06/28/2023   BUN 53 (H) 06/28/2023   NA 141 06/28/2023   K 3.1 (L) 06/28/2023   CL 111 06/28/2023   CO2 22 06/28/2023    Lab Results  Component Value Date   ALT 26 05/26/2023   AST 22 05/26/2023   ALKPHOS 76 05/26/2023   BILITOT 0.8 05/26/2023      Microbiology: Recent Results (from the past 240 hours)  Blood culture (routine x 2)  Status: Abnormal   Collection Time: 06/24/23 10:45 PM   Specimen: BLOOD  Result Value Ref Range Status   Specimen Description BLOOD RIGHT ANTECUBITAL  Final   Special Requests   Final    BOTTLES DRAWN AEROBIC AND ANAEROBIC Blood Culture adequate volume   Culture  Setup Time   Final    GRAM NEGATIVE RODS AEROBIC BOTTLE ONLY CRITICAL VALUE NOTED.  VALUE IS CONSISTENT WITH PREVIOUSLY REPORTED AND CALLED VALUE.    Culture (A)  Final    ESCHERICHIA COLI SUSCEPTIBILITIES PERFORMED ON PREVIOUS CULTURE WITHIN THE LAST 5 DAYS. Performed at [facility], [address], [place], [place] [address]    Report Status 06/28/2023 FINAL  Final  Blood culture (routine x 2)     Status: Abnormal   Collection Time: 06/24/23 10:58 PM   Specimen: BLOOD RIGHT HAND  Result Value Ref Range Status   Specimen Description BLOOD RIGHT HAND   Final   Special Requests   Final    BOTTLES DRAWN AEROBIC AND ANAEROBIC Blood Culture adequate volume   Culture  Setup Time   Final    GRAM NEGATIVE RODS AEROBIC BOTTLE ONLY CRITICAL RESULT CALLED TO, READ BACK BY AND VERIFIED WITH: Pharmd Nathan G on [id number] @1422  by SM Performed at [facility], [address] [place], [place] [address]    Culture (A)  Final    ESCHERICHIA COLI Confirmed Extended Spectrum Beta-Lactamase Producer (ESBL).  In bloodstream infections from ESBL organisms, carbapenems are preferred over piperacillin/tazobactam. They are shown to have a lower risk of mortality.    Report Status 06/27/2023 FINAL  Final   Organism ID, Bacteria ESCHERICHIA COLI  Final      Susceptibility   Escherichia coli - MIC*    AMPICILLIN >=32 RESISTANT Resistant     CEFEPIME 16 RESISTANT Resistant     CEFTAZIDIME RESISTANT Resistant     CEFTRIAXONE  >=64 RESISTANT Resistant     CIPROFLOXACIN  1 RESISTANT Resistant     GENTAMICIN  >=16 RESISTANT Resistant     IMIPENEM <=0.25 SENSITIVE Sensitive     TRIMETH/SULFA >=320 RESISTANT Resistant     AMPICILLIN/SULBACTAM >=32 RESISTANT Resistant     PIP/TAZO 8 SENSITIVE Sensitive ug/mL    * ESCHERICHIA COLI  Urine Culture     Status: Abnormal   Collection Time: 06/24/23 10:58 PM   Specimen: Urine, Random  Result Value Ref Range Status   Specimen Description URINE, RANDOM  Final   Special Requests   Final    NONE Reflexed from [id number] Performed at [facility], [address], [place], [place] [address]    Culture (A)  Final    >=100,000 COLONIES/mL ESCHERICHIA COLI Confirmed Extended Spectrum Beta-Lactamase Producer (ESBL).  In bloodstream infections from ESBL organisms, carbapenems are preferred over piperacillin/tazobactam. They are shown to have a lower risk of mortality.    Report Status 06/27/2023 FINAL  Final   Organism ID, Bacteria ESCHERICHIA COLI (A)  Final      Susceptibility   Escherichia coli - MIC*    AMPICILLIN  >=32 RESISTANT Resistant     CEFAZOLIN  >=64 RESISTANT Resistant     CEFEPIME 16 RESISTANT Resistant     CEFTRIAXONE  >=64 RESISTANT Resistant     CIPROFLOXACIN  1 RESISTANT Resistant     GENTAMICIN  >=16 RESISTANT Resistant     IMIPENEM <=0.25 SENSITIVE Sensitive     NITROFURANTOIN 64 INTERMEDIATE Intermediate     TRIMETH/SULFA >=320 RESISTANT Resistant     AMPICILLIN/SULBACTAM >=32 RESISTANT  Resistant     PIP/TAZO 8 SENSITIVE Sensitive ug/mL    * >=100,000 COLONIES/mL ESCHERICHIA COLI  Blood Culture ID Panel (Reflexed)     Status: Abnormal   Collection Time: 06/24/23 10:58 PM  Result Value Ref Range Status   Enterococcus faecalis NOT DETECTED NOT DETECTED Final   Enterococcus Faecium NOT DETECTED NOT DETECTED Final   Listeria monocytogenes NOT DETECTED NOT DETECTED Final   Staphylococcus species NOT DETECTED NOT DETECTED Final   Staphylococcus aureus (BCID) NOT DETECTED NOT DETECTED Final   Staphylococcus epidermidis NOT DETECTED NOT DETECTED Final   Staphylococcus lugdunensis NOT DETECTED NOT DETECTED Final   Streptococcus species NOT DETECTED NOT DETECTED Final   Streptococcus agalactiae NOT DETECTED NOT DETECTED Final   Streptococcus pneumoniae NOT DETECTED NOT DETECTED Final   Streptococcus pyogenes NOT DETECTED NOT DETECTED Final   A.calcoaceticus-baumannii NOT DETECTED NOT DETECTED Final   Bacteroides fragilis NOT DETECTED NOT DETECTED Final   Enterobacterales DETECTED (A) NOT DETECTED Final    Comment: Enterobacterales represent a large order of gram negative bacteria, not a single organism. CRITICAL RESULT CALLED TO, READ BACK BY AND VERIFIED WITH: Pharmd Nathan G on [id number] @1422  by SM    Enterobacter cloacae complex NOT DETECTED NOT DETECTED Final   Escherichia coli DETECTED (A) NOT DETECTED Final    Comment: CRITICAL RESULT CALLED TO, READ BACK BY AND VERIFIED WITH: Pharmd Johnita Nails on [phone] @1422  by SM    Klebsiella aerogenes NOT DETECTED NOT DETECTED Final    Klebsiella oxytoca NOT DETECTED NOT DETECTED Final   Klebsiella pneumoniae NOT DETECTED NOT DETECTED Final   Proteus species NOT DETECTED NOT DETECTED Final   Salmonella species NOT DETECTED NOT DETECTED Final   Serratia marcescens NOT DETECTED NOT DETECTED Final   Haemophilus influenzae NOT DETECTED NOT DETECTED Final   Neisseria meningitidis NOT DETECTED NOT DETECTED Final   Pseudomonas aeruginosa NOT DETECTED NOT DETECTED Final   Stenotrophomonas maltophilia NOT DETECTED NOT DETECTED Final   Candida albicans NOT DETECTED NOT DETECTED Final   Candida auris NOT DETECTED NOT DETECTED Final   Candida glabrata NOT DETECTED NOT DETECTED Final   Candida krusei NOT DETECTED NOT DETECTED Final   Candida parapsilosis NOT DETECTED NOT DETECTED Final   Candida tropicalis NOT DETECTED NOT DETECTED Final   Cryptococcus neoformans/gattii NOT DETECTED NOT DETECTED Final   CTX-M ESBL DETECTED (A) NOT DETECTED Final    Comment: CRITICAL RESULT CALLED TO, READ BACK BY AND VERIFIED WITH: Pharmd Nathan G on 052425 @1422  by SM (NOTE) Extended spectrum beta-lactamase detected. Recommend a carbapenem as initial therapy.      Carbapenem resistance IMP NOT DETECTED NOT DETECTED Final   Carbapenem resistance KPC NOT DETECTED NOT DETECTED Final   Carbapenem resistance NDM NOT DETECTED NOT DETECTED Final   Carbapenem resist OXA 48 LIKE NOT DETECTED NOT DETECTED Final   Carbapenem resistance VIM NOT DETECTED NOT DETECTED Final    Comment: Performed at [facility], [address] [place], [place] [address]  Body fluid culture w Gram Stain     Status: None   Collection Time: 06/24/23 11:48 PM   Specimen: Peritoneal Dialysate; Body Fluid  Result Value Ref Range Status   Specimen Description PERITONEAL DIALYSATE  Final   Special Requests NONE  Final   Gram Stain NO WBC SEEN NO ORGANISMS SEEN   Final   Culture   Final    NO GROWTH 3 DAYS Performed at [facility], [address],  [place],  [place] [address]    Report Status 06/28/2023 FINAL  Final  Resp panel by RT-PCR (RSV, Flu A&B, Covid) Anterior Nasal Swab     Status: None   Collection Time: 06/25/23 12:09 AM   Specimen: Anterior Nasal Swab  Result Value Ref Range Status   SARS Coronavirus 2 by RT PCR NEGATIVE NEGATIVE Final   Influenza A by PCR NEGATIVE NEGATIVE Final   Influenza B by PCR NEGATIVE NEGATIVE Final    Comment: (NOTE) The Xpert Xpress SARS-CoV-2/FLU/RSV plus assay is intended as an aid in the diagnosis of influenza from Nasopharyngeal swab specimens and should not be used as a sole basis for treatment. Nasal washings and aspirates are unacceptable for Xpert Xpress SARS-CoV-2/FLU/RSV testing.  Fact Sheet for Patients: BloggerCourse.com  Fact Sheet for Healthcare Providers: SeriousBroker.it  This test is not yet approved or cleared by the [place]  FDA and has been authorized for detection and/or diagnosis of SARS-CoV-2 by FDA under an Emergency Use Authorization (EUA). This EUA will remain in effect (meaning this test can be used) for the duration of the COVID-19 declaration under Section 564(b)(1) of the Act, 21 U.S.C. section 360bbb-3(b)(1), unless the authorization is terminated or revoked.     Resp Syncytial Virus by PCR NEGATIVE NEGATIVE Final    Comment: (NOTE) Fact Sheet for Patients: BloggerCourse.com  Fact Sheet for Healthcare Providers: SeriousBroker.it  This test is not yet approved or cleared by the [place]  FDA and has been authorized for detection and/or diagnosis of SARS-CoV-2 by FDA under an Emergency Use Authorization (EUA). This EUA will remain in effect (meaning this test can be used) for the duration of the COVID-19 declaration under Section 564(b)(1) of the Act, 21 U.S.C. section 360bbb-3(b)(1), unless the authorization is terminated or revoked.  Performed at  [facility], [address], [place], [place] [address]   Culture, blood (Routine X 2) w Reflex to ID Panel     Status: None (Preliminary result)   Collection Time: 06/26/23  2:28 PM   Specimen: BLOOD RIGHT ARM  Result Value Ref Range Status   Specimen Description BLOOD RIGHT ARM  Final   Special Requests   Final    BOTTLES DRAWN AEROBIC AND ANAEROBIC Blood Culture adequate volume   Culture   Final    NO GROWTH 2 DAYS Performed at [facility], [address], [place], [place] [address]    Report Status PENDING  Incomplete  Culture, blood (Routine X 2) w Reflex to ID Panel     Status: None (Preliminary result)   Collection Time: 06/26/23  2:30 PM   Specimen: BLOOD RIGHT HAND  Result Value Ref Range Status   Specimen Description BLOOD RIGHT HAND  Final   Special Requests   Final    BOTTLES DRAWN AEROBIC AND ANAEROBIC Blood Culture adequate volume   Culture   Final    NO GROWTH 2 DAYS Performed at [facility], [address] [place], [place] [address]    Report Status PENDING  Incomplete     Serology:   Imaging: If present, new imagings (plain films, ct scans, and mri) have been personally visualized and interpreted; radiology reports have been reviewed. Decision making incorporated into the Impression / Recommendations.  5/25 tte Conclusion(s)/Recommendation(s): No evidence of valvular vegetations on  this transthoracic echocardiogram. Consider a transesophageal  echocardiogram to exclude infective endocarditis if clinically indicated.   Jamesetta Mcbride, MD [facility] [phone] pager  06/28/2023, 4:55 PM

## 2023-06-28 NOTE — Hospital Course (Signed)
 Patient is an 83 year old male with past medical history significant for recent admission for UTI secondary to E. coli ESBL (05/26/2023 to 05/30/2023), end-stage renal disease on peritoneal dialysis, hypertension, hyperlipidemia, type 2 diabetes mellitus and OSA on CPAP presented to the hospital with fever and chills.  Of note patient had recently completed meropenem  on 06/05/2023.  Blood cultures and urine culture with ESBL E. coli.  CT scan of the abdomen nonrevealing.  ID on board.  2D echocardiogram negative for vegetation.  Repeat blood culture pending and is currently on IV meropenem ..   Relapsed urinary tract infection secondary to ESBL E. coli: Sepsis/bacteremia  History recent ESBL E. Coli UTI:  CT abdomen pelvis without contrast does have nonspecific perinephric stranding; no acute abnormality noted.  Suspect relapse UTI with possible ESBL species.  Seen by infectious disease.  At this time patient is on IV meropenem  and ID plans to continue for 3 more days.Urine culture and blood culture with ESBL E. coli.  Repeat blood cultures from 06/26/2023 is negative so far.  2D echocardiogram from 06/26/2023 with LV ejection fraction of 50 to 55% with grade 1 diastolic dysfunction.  No evidence of valvular vegetation noted.   ESRD on PD:  -Nephrology on board.   Hypokalemia: Potassium 3.1 today.  Will replenish with 40 milliequivalent of potassium today.   Diabetes type 2 with episode of glycemia. Aaron Aas  Has been able to eat okay.  Will continue with sliding scale insulin .   Hypertension:  Elevated.  Continue hydralazine  100 mg 3 times daily, metoprolol  100/75 mg AM/PM.  Might need adjustment if not improved.   Hyperlipidemia:  Continue Zetia    OSA:  Continue CPAP   BPH: - Continue tamsulosin   Trace pericardial effusion  Aortic atherosclerosis  Incidental findings.

## 2023-06-28 NOTE — Progress Notes (Addendum)
 PROGRESS NOTE  Carlos Gill ZOX:[id number] DOB: 1940/04/18 DOA: 06/24/2023 PCP: Suan Elm, MD   LOS: 4 days   Brief narrative:  Patient is an 83 year old male with past medical history significant for recent admission for UTI secondary to E. coli ESBL (05/26/2023 to 05/30/2023), end-stage renal disease on peritoneal dialysis, hypertension, hyperlipidemia, type 2 diabetes mellitus and OSA on CPAP presented to the hospital with fever and chills.  Of note patient had recently completed meropenem  on 06/05/2023.  Blood cultures and urine culture with ESBL E. coli.  CT scan of the abdomen nonrevealing.  ID on board.  2D echocardiogram negative for vegetation.  Repeat blood culture pending and is currently on IV meropenem .  Assessment/Plan: Principal Problem:   ESBL (extended spectrum beta-lactamase) producing bacteria infection Active Problems:   Sepsis (HCC)   Bacteremia  Relapsed urinary tract infection secondary to ESBL E. coli: Sepsis/bacteremia  History recent ESBL E. Coli UTI:  CT abdomen pelvis without contrast does have nonspecific perinephric stranding; no acute abnormality noted.  Suspect relapse UTI with possible ESBL species.  Seen by infectious disease.  At this time, patient is on IV meropenem  and ID plans to continue for 3 more days. Urine culture and blood culture with ESBL E. coli.  Repeat blood cultures from 06/26/2023 is negative so far.  2D echocardiogram from 06/26/2023 with LV ejection fraction of 50 to 55% with grade 1 diastolic dysfunction.  No evidence of valvular vegetation noted.   ESRD on PD:  -Nephrology on board.   Hypokalemia: Potassium 3.1 today.  Will replenish with 40 milliequivalent of potassium today.   Diabetes type 2 with episode of hypoglycemia, level 2 .  Has been able to eat okay.  Will continue with sliding scale insulin .  Closely monitor glucose levels.   Hypertension:  Elevated.  Continue hydralazine  100 mg 3 times daily, metoprolol  100/75 mg  AM/PM.  Might need adjustment if not improved.   Hyperlipidemia:  Continue Zetia    OSA:  Continue CPAP   BPH: - Continue tamsulosin   Chronic Diastolic Heart Failure  On peritoneal dialysis for volume management  Trace pericardial effusion  Aortic atherosclerosis  Incidental findings.   Weakness and debility.  Will get PT OT evaluation.  DVT prophylaxis: heparin  injection 5,000 Units Start: 06/25/23 0600   Disposition: Uncertain at this time.  Status is: Inpatient  Remains inpatient appropriate because: IV antibiotic, pending clinical improvement    Code Status:     Code Status: Full Code  Family Communication: None at bedside.    Consultants: Infectious disease Nephrology  Procedures: Peritoneal dialysis  Anti-infectives:  Meropenem  IV  Anti-infectives (From admission, onward)    Start     Dose/Rate Route Frequency Ordered Stop   06/25/23 0200  meropenem  (MERREM ) 500 mg in sodium chloride  0.9 % 100 mL IVPB        500 mg 200 mL/hr over 30 Minutes Intravenous Every 24 hours 06/25/23 0055     06/25/23 0000  vancomycin (VANCOREADY) IVPB 2000 mg/400 mL        2,000 mg 200 mL/hr over 120 Minutes Intravenous  Once 06/24/23 2354 06/25/23 0356   06/25/23 0000  cefTRIAXone  (ROCEPHIN ) 2 g in sodium chloride  0.9 % 100 mL IVPB  Status:  Discontinued        2 g 200 mL/hr over 30 Minutes Intravenous Every 24 hours 06/24/23 2354 06/25/23 0032       Subjective: Today, patient was seen and examined at bedside.  Patient denies any nausea, vomiting,  fever, chills or rigor. Had ambulated  some.   Objective: Vitals:   06/28/23 0520 06/28/23 0835  BP: (!) 159/65 (!) 188/70  Pulse: 70 75  Resp: 17   Temp: 98.1 F (36.7 C) 98.4 F (36.9 C)  SpO2: 100% 100%    Intake/Output Summary (Last 24 hours) at 06/28/2023 1037 Last data filed at 06/28/2023 1610 Gross per 24 hour  Intake --  Output 725 ml  Net -725 ml   Filed Weights   06/24/23 1823 06/25/23 1751   Weight: 98.4 kg 101.5 kg   Body mass index is 30.35 kg/m.   Physical Exam:  GENERAL: Patient is alert awake and oriented. Not in obvious distress. obese HENT: No scleral pallor or icterus. Pupils equally reactive to light. Oral mucosa is moist NECK: is supple, no gross swelling noted. CHEST: Clear to auscultation. No crackles or wheezes.  CVS: S1 and S2 heard, no murmur. Regular rate and rhythm.  ABDOMEN: Soft, non-tender, bowel sounds are present. EXTREMITIES: No edema. CNS: Cranial nerves are intact. No focal motor deficits. SKIN: warm and dry without rashes.  Data Review: I have personally reviewed the following laboratory data and studies,  CBC: Recent Labs  Lab 06/24/23 1818 06/25/23 0815  WBC 7.1 8.4  NEUTROABS 5.5 6.9  HGB 10.9* 9.8*  HCT 33.8* 30.0*  MCV 83.5 83.3  PLT 161 134*   Basic Metabolic Panel: Recent Labs  Lab 06/24/23 1818 06/25/23 0718 06/25/23 0815 06/26/23 0735 06/26/23 1304 06/27/23 0510 06/28/23 0450  NA 133* 136  --  136  --  139 141  K 3.3* 2.8*  --  2.6* 3.2* 2.8* 3.1*  CL 97* 103  --  106  --  110 111  CO2 22 20*  --  22  --  21* 22  GLUCOSE 429* 199*  --  59*  --  99 159*  BUN 50* 54*  --  54*  --  56* 53*  CREATININE 4.49* 4.38*  --  4.16*  --  4.05* 3.88*  CALCIUM 8.6* 8.2*  --  7.9*  --  8.2* 8.4*  MG  --   --  1.5*  --   --  1.7 2.1  PHOS  --  3.6  --  3.4  --  3.2 2.8   Liver Function Tests: Recent Labs  Lab 06/25/23 0718 06/26/23 0735 06/27/23 0510 06/28/23 0450  ALBUMIN 2.7* 2.4* 2.4* 2.3*   No results for input(s): "LIPASE", "AMYLASE" in the last 168 hours. No results for input(s): "AMMONIA" in the last 168 hours. Cardiac Enzymes: No results for input(s): "CKTOTAL", "CKMB", "CKMBINDEX", "TROPONINI" in the last 168 hours. BNP (last 3 results) Recent Labs    10/17/22 1513  BNP 1,115.0*    ProBNP (last 3 results) No results for input(s): "PROBNP" in the last 8760 hours.  CBG: Recent Labs  Lab  06/27/23 1608 06/27/23 2021 06/28/23 0047 06/28/23 0521 06/28/23 0830  GLUCAP 98 182* 270* 155* 153*   Recent Results (from the past 240 hours)  Blood culture (routine x 2)     Status: Abnormal   Collection Time: 06/24/23 10:45 PM   Specimen: BLOOD  Result Value Ref Range Status   Specimen Description BLOOD RIGHT ANTECUBITAL  Final   Special Requests   Final    BOTTLES DRAWN AEROBIC AND ANAEROBIC Blood Culture adequate volume   Culture  Setup Time   Final    GRAM NEGATIVE RODS AEROBIC BOTTLE ONLY CRITICAL VALUE NOTED.  VALUE IS CONSISTENT WITH  PREVIOUSLY REPORTED AND CALLED VALUE.    Culture (A)  Final    ESCHERICHIA COLI SUSCEPTIBILITIES PERFORMED ON PREVIOUS CULTURE WITHIN THE LAST 5 DAYS. Performed at [facility], [address], [place], [place] [address]    Report Status 06/28/2023 FINAL  Final  Blood culture (routine x 2)     Status: Abnormal   Collection Time: 06/24/23 10:58 PM   Specimen: BLOOD RIGHT HAND  Result Value Ref Range Status   Specimen Description BLOOD RIGHT HAND  Final   Special Requests   Final    BOTTLES DRAWN AEROBIC AND ANAEROBIC Blood Culture adequate volume   Culture  Setup Time   Final    GRAM NEGATIVE RODS AEROBIC BOTTLE ONLY CRITICAL RESULT CALLED TO, READ BACK BY AND VERIFIED WITH: Pharmd Nathan G on [id number] @1422  by SM Performed at [facility], [address], [place], [place] [address]    Culture (A)  Final    ESCHERICHIA COLI Confirmed Extended Spectrum Beta-Lactamase Producer (ESBL).  In bloodstream infections from ESBL organisms, carbapenems are preferred over piperacillin/tazobactam. They are shown to have a lower risk of mortality.    Report Status 06/27/2023 FINAL  Final   Organism ID, Bacteria ESCHERICHIA COLI  Final      Susceptibility   Escherichia coli - MIC*    AMPICILLIN >=32 RESISTANT Resistant     CEFEPIME 16 RESISTANT Resistant     CEFTAZIDIME RESISTANT Resistant     CEFTRIAXONE  >=64 RESISTANT Resistant      CIPROFLOXACIN  1 RESISTANT Resistant     GENTAMICIN  >=16 RESISTANT Resistant     IMIPENEM <=0.25 SENSITIVE Sensitive     TRIMETH/SULFA >=320 RESISTANT Resistant     AMPICILLIN/SULBACTAM >=32 RESISTANT Resistant     PIP/TAZO 8 SENSITIVE Sensitive ug/mL    * ESCHERICHIA COLI  Urine Culture     Status: Abnormal   Collection Time: 06/24/23 10:58 PM   Specimen: Urine, Random  Result Value Ref Range Status   Specimen Description URINE, RANDOM  Final   Special Requests   Final    NONE Reflexed from [id number] Performed at [facility], [address] [place], [place] [address]    Culture (A)  Final    >=100,000 COLONIES/mL ESCHERICHIA COLI Confirmed Extended Spectrum Beta-Lactamase Producer (ESBL).  In bloodstream infections from ESBL organisms, carbapenems are preferred over piperacillin/tazobactam. They are shown to have a lower risk of mortality.    Report Status 06/27/2023 FINAL  Final   Organism ID, Bacteria ESCHERICHIA COLI (A)  Final      Susceptibility   Escherichia coli - MIC*    AMPICILLIN >=32 RESISTANT Resistant     CEFAZOLIN  >=64 RESISTANT Resistant     CEFEPIME 16 RESISTANT Resistant     CEFTRIAXONE  >=64 RESISTANT Resistant     CIPROFLOXACIN  1 RESISTANT Resistant     GENTAMICIN  >=16 RESISTANT Resistant     IMIPENEM <=0.25 SENSITIVE Sensitive     NITROFURANTOIN 64 INTERMEDIATE Intermediate     TRIMETH/SULFA >=320 RESISTANT Resistant     AMPICILLIN/SULBACTAM >=32 RESISTANT Resistant     PIP/TAZO 8 SENSITIVE Sensitive ug/mL    * >=100,000 COLONIES/mL ESCHERICHIA COLI  Blood Culture ID Panel (Reflexed)     Status: Abnormal   Collection Time: 06/24/23 10:58 PM  Result Value Ref Range Status   Enterococcus faecalis NOT DETECTED NOT DETECTED Final   Enterococcus Faecium NOT DETECTED NOT DETECTED Final   Listeria monocytogenes NOT DETECTED NOT DETECTED Final   Staphylococcus species  NOT DETECTED NOT DETECTED Final   Staphylococcus aureus (BCID) NOT DETECTED NOT  DETECTED Final   Staphylococcus epidermidis NOT DETECTED NOT DETECTED Final   Staphylococcus lugdunensis NOT DETECTED NOT DETECTED Final   Streptococcus species NOT DETECTED NOT DETECTED Final   Streptococcus agalactiae NOT DETECTED NOT DETECTED Final   Streptococcus pneumoniae NOT DETECTED NOT DETECTED Final   Streptococcus pyogenes NOT DETECTED NOT DETECTED Final   A.calcoaceticus-baumannii NOT DETECTED NOT DETECTED Final   Bacteroides fragilis NOT DETECTED NOT DETECTED Final   Enterobacterales DETECTED (A) NOT DETECTED Final    Comment: Enterobacterales represent a large order of gram negative bacteria, not a single organism. CRITICAL RESULT CALLED TO, READ BACK BY AND VERIFIED WITH: Pharmd Nathan G on [id number] @1422  by SM    Enterobacter cloacae complex NOT DETECTED NOT DETECTED Final   Escherichia coli DETECTED (A) NOT DETECTED Final    Comment: CRITICAL RESULT CALLED TO, READ BACK BY AND VERIFIED WITH: Pharmd Nathan G on [id number] @1422  by SM    Klebsiella aerogenes NOT DETECTED NOT DETECTED Final   Klebsiella oxytoca NOT DETECTED NOT DETECTED Final   Klebsiella pneumoniae NOT DETECTED NOT DETECTED Final   Proteus species NOT DETECTED NOT DETECTED Final   Salmonella species NOT DETECTED NOT DETECTED Final   Serratia marcescens NOT DETECTED NOT DETECTED Final   Haemophilus influenzae NOT DETECTED NOT DETECTED Final   Neisseria meningitidis NOT DETECTED NOT DETECTED Final   Pseudomonas aeruginosa NOT DETECTED NOT DETECTED Final   Stenotrophomonas maltophilia NOT DETECTED NOT DETECTED Final   Candida albicans NOT DETECTED NOT DETECTED Final   Candida auris NOT DETECTED NOT DETECTED Final   Candida glabrata NOT DETECTED NOT DETECTED Final   Candida krusei NOT DETECTED NOT DETECTED Final   Candida parapsilosis NOT DETECTED NOT DETECTED Final   Candida tropicalis NOT DETECTED NOT DETECTED Final   Cryptococcus neoformans/gattii NOT DETECTED NOT DETECTED Final   CTX-M ESBL DETECTED (A)  NOT DETECTED Final    Comment: CRITICAL RESULT CALLED TO, READ BACK BY AND VERIFIED WITH: Pharmd Nathan G on [id number] @1422  by SM (NOTE) Extended spectrum beta-lactamase detected. Recommend a carbapenem as initial therapy.      Carbapenem resistance IMP NOT DETECTED NOT DETECTED Final   Carbapenem resistance KPC NOT DETECTED NOT DETECTED Final   Carbapenem resistance NDM NOT DETECTED NOT DETECTED Final   Carbapenem resist OXA 48 LIKE NOT DETECTED NOT DETECTED Final   Carbapenem resistance VIM NOT DETECTED NOT DETECTED Final    Comment: Performed at [facility], [address], [place], [place] [address]  Body fluid culture w Gram Stain     Status: None (Preliminary result)   Collection Time: 06/24/23 11:48 PM   Specimen: Peritoneal Dialysate; Body Fluid  Result Value Ref Range Status   Specimen Description PERITONEAL DIALYSATE  Final   Special Requests NONE  Final   Gram Stain NO WBC SEEN NO ORGANISMS SEEN   Final   Culture   Final    NO GROWTH 2 DAYS Performed at [facility], [address], [place], [place] [address]    Report Status PENDING  Incomplete  Resp panel by RT-PCR (RSV, Flu A&B, Covid) Anterior Nasal Swab     Status: None   Collection Time: 06/25/23 12:09 AM   Specimen: Anterior Nasal Swab  Result Value Ref Range Status   SARS Coronavirus 2 by RT PCR NEGATIVE NEGATIVE Final   Influenza A by PCR NEGATIVE NEGATIVE Final   Influenza B by  PCR NEGATIVE NEGATIVE Final    Comment: (NOTE) The Xpert Xpress SARS-CoV-2/FLU/RSV plus assay is intended as an aid in the diagnosis of influenza from Nasopharyngeal swab specimens and should not be used as a sole basis for treatment. Nasal washings and aspirates are unacceptable for Xpert Xpress SARS-CoV-2/FLU/RSV testing.  Fact Sheet for Patients: BloggerCourse.com  Fact Sheet for Healthcare Providers: SeriousBroker.it  This test is not yet approved or cleared by  the [place]  FDA and has been authorized for detection and/or diagnosis of SARS-CoV-2 by FDA under an Emergency Use Authorization (EUA). This EUA will remain in effect (meaning this test can be used) for the duration of the COVID-19 declaration under Section 564(b)(1) of the Act, 21 U.S.C. section 360bbb-3(b)(1), unless the authorization is terminated or revoked.     Resp Syncytial Virus by PCR NEGATIVE NEGATIVE Final    Comment: (NOTE) Fact Sheet for Patients: BloggerCourse.com  Fact Sheet for Healthcare Providers: SeriousBroker.it  This test is not yet approved or cleared by the [place]  FDA and has been authorized for detection and/or diagnosis of SARS-CoV-2 by FDA under an Emergency Use Authorization (EUA). This EUA will remain in effect (meaning this test can be used) for the duration of the COVID-19 declaration under Section 564(b)(1) of the Act, 21 U.S.C. section 360bbb-3(b)(1), unless the authorization is terminated or revoked.  Performed at [facility], [address], [place], [place] [address]   Culture, blood (Routine X 2) w Reflex to ID Panel     Status: None (Preliminary result)   Collection Time: 06/26/23  2:28 PM   Specimen: BLOOD RIGHT ARM  Result Value Ref Range Status   Specimen Description BLOOD RIGHT ARM  Final   Special Requests   Final    BOTTLES DRAWN AEROBIC AND ANAEROBIC Blood Culture adequate volume   Culture   Final    NO GROWTH 2 DAYS Performed at [facility], [address] [place], [place] [address]    Report Status PENDING  Incomplete  Culture, blood (Routine X 2) w Reflex to ID Panel     Status: None (Preliminary result)   Collection Time: 06/26/23  2:30 PM   Specimen: BLOOD RIGHT HAND  Result Value Ref Range Status   Specimen Description BLOOD RIGHT HAND  Final   Special Requests   Final    BOTTLES DRAWN AEROBIC AND ANAEROBIC Blood Culture adequate volume    Culture   Final    NO GROWTH 2 DAYS Performed at [facility], [address] [place], [place] [address]    Report Status PENDING  Incomplete     Studies: ECHOCARDIOGRAM COMPLETE Result Date: 06/26/2023    ECHOCARDIOGRAM REPORT   Patient Name:   Carlos Gill Date of Exam: 06/26/2023 Medical Rec #:  [id number]    Height:       72.0 in Accession #:    [id number]   Weight:       223.8 lb Date of Birth:  11-29-1940    BSA:          2.235 m Patient Age:    83 years     BP:           161/84 mmHg Patient Gender: M            HR:           76 bpm. Exam Location:  Inpatient Procedure: 2D Echo, Cardiac Doppler and Color Doppler (Both Spectral and Color  Flow Doppler were utilized during procedure). Indications:    Bacteremia R78.81  History:        Patient has prior history of Echocardiogram examinations, most                 recent 10/18/2022. CHF, CKD, stage 5, Signs/Symptoms:Chest Pain                 and Shortness of Breath; Risk Factors:Hypertension, Sleep Apnea,                 Diabetes and Dyslipidemia.  Sonographer:    Terrilee Few RCS Referring Phys: [id number] SYLVESTER I OGBATA IMPRESSIONS  1. Left ventricular ejection fraction, by estimation, is 50 to 55%. The left ventricle has low normal function. The left ventricle demonstrates regional wall motion abnormalities. Abnormal (paradoxical) septal motion, consistent with left bundle branch block.     There is moderate left ventricular hypertrophy. Left ventricular diastolic parameters are consistent with Grade I diastolic dysfunction (impaired relaxation).  2. Right ventricular systolic function is normal. The right ventricular size is normal. There is moderately elevated pulmonary artery systolic pressure. The estimated right ventricular systolic pressure is 59.6 mmHg.  3. The mitral valve is normal in structure. Mild mitral valve regurgitation.  4. The aortic valve is tricuspid. Aortic valve regurgitation is trivial. No aortic stenosis is  present.  5. The inferior vena cava is dilated in size with <50% respiratory variability, suggesting right atrial pressure of 15 mmHg. Conclusion(s)/Recommendation(s): No evidence of valvular vegetations on this transthoracic echocardiogram. Consider a transesophageal echocardiogram to exclude infective endocarditis if clinically indicated. FINDINGS  Left Ventricle: Left ventricular ejection fraction, by estimation, is 50 to 55%. The left ventricle has low normal function. The left ventricle demonstrates regional wall motion abnormalities. The left ventricular internal cavity size was normal in size. There is moderate left ventricular hypertrophy. Abnormal (paradoxical) septal motion, consistent with left bundle branch block. Left ventricular diastolic parameters are consistent with Grade I diastolic dysfunction (impaired relaxation). Right Ventricle: The right ventricular size is normal. No increase in right ventricular wall thickness. Right ventricular systolic function is normal. There is moderately elevated pulmonary artery systolic pressure. The tricuspid regurgitant velocity is 3.34 m/s, and with an assumed right atrial pressure of 15 mmHg, the estimated right ventricular systolic pressure is 59.6 mmHg. Left Atrium: Left atrial size was normal in size. Right Atrium: Right atrial size was normal in size. Pericardium: Trivial pericardial effusion is present. Mitral Valve: The mitral valve is normal in structure. Mild mitral valve regurgitation. Tricuspid Valve: The tricuspid valve is normal in structure. Tricuspid valve regurgitation is mild. Aortic Valve: The aortic valve is tricuspid. Aortic valve regurgitation is trivial. No aortic stenosis is present. Aortic valve mean gradient measures 7.5 mmHg. Aortic valve peak gradient measures 14.0 mmHg. Aortic valve area, by VTI measures 2.79 cm. Pulmonic Valve: The pulmonic valve was not well visualized. Pulmonic valve regurgitation is trivial. Aorta: The aortic root  and ascending aorta are structurally normal, with no evidence of dilitation. Venous: The inferior vena cava is dilated in size with less than 50% respiratory variability, suggesting right atrial pressure of 15 mmHg. IAS/Shunts: The atrial septum is grossly normal.  LEFT VENTRICLE PLAX 2D LVIDd:         4.80 cm      Diastology LVIDs:         3.60 cm      LV e' medial:    8.70 cm/s LV PW:  1.20 cm      LV E/e' medial:  10.7 LV IVS:        1.35 cm      LV e' lateral:   8.38 cm/s LVOT diam:     2.10 cm      LV E/e' lateral: 11.1 LV SV:         104 LV SV Index:   46 LVOT Area:     3.46 cm  LV Volumes (MOD) LV vol d, MOD A2C: 176.0 ml LV vol d, MOD A4C: 176.0 ml LV vol s, MOD A2C: 78.3 ml LV vol s, MOD A4C: 89.1 ml LV SV MOD A2C:     97.7 ml LV SV MOD A4C:     176.0 ml LV SV MOD BP:      89.0 ml RIGHT VENTRICLE             IVC RV S prime:     15.00 cm/s  IVC diam: 2.50 cm TAPSE (M-mode): 4.0 cm LEFT ATRIUM             Index        RIGHT ATRIUM           Index LA diam:        4.30 cm 1.92 cm/m   RA Area:     22.80 cm LA Vol (A2C):   67.3 ml 30.12 ml/m  RA Volume:   63.10 ml  28.24 ml/m LA Vol (A4C):   70.1 ml 31.37 ml/m LA Biplane Vol: 71.7 ml 32.08 ml/m  AORTIC VALVE AV Area (Vmax):    2.76 cm AV Area (Vmean):   2.73 cm AV Area (VTI):     2.79 cm AV Vmax:           187.00 cm/s AV Vmean:          122.000 cm/s AV VTI:            0.373 m AV Peak Grad:      14.0 mmHg AV Mean Grad:      7.5 mmHg LVOT Vmax:         149.00 cm/s LVOT Vmean:        96.100 cm/s LVOT VTI:          0.300 m LVOT/AV VTI ratio: 0.80  AORTA Ao Root diam: 3.10 cm Ao Asc diam:  3.30 cm MITRAL VALVE                TRICUSPID VALVE MV Area (PHT): 3.60 cm     TR Peak grad:   44.6 mmHg MV Decel Time: 211 msec     TR Vmax:        334.00 cm/s MV E velocity: 92.80 cm/s MV A velocity: 132.00 cm/s  SHUNTS MV E/A ratio:  0.70         Systemic VTI:  0.30 m                             Systemic Diam: 2.10 cm Carson Clara MD Electronically  signed by Carson Clara MD Signature Date/Time: 06/26/2023/6:52:09 PM    Final       Rosena Conradi, MD  [facility] 06/28/2023  If 7PM-7AM, please contact night-coverage

## 2023-06-28 NOTE — Progress Notes (Signed)
 Staff called the [facility] to inform that PD machine is beeping.   This RN wen to assess situation and also heard the machine alarming.  Troubleshoot completed and machine is finishing up the last drain.  Primary nurse made aware that PD continues and to reach out if any issues.

## 2023-06-28 NOTE — Progress Notes (Signed)
 Nutrition Brief Note  RD received consult for calorie count  83 y/o male with h/o HTN, BPH, GERD, HLD, DM, neuropathy, R renal mass, ESRD on PD, secondary hyperparathyroidism, CHF, OSA and recent admission for UTI secondary to E. coli ESBL (05/26/2023 to 05/30/2023) who is admitted with sepsis and bacteremia.   Met with pt in room today. Pt reports good appetite and oral intake pta and in hospital; pt documented to be eating 100% of meals. Pt's lunch tray is sitting on his side table with 90% eaten from it. Per chart, pt appears fairly weight stable at baseline; pt denies any recent weight changes. RD discussed with pt the importance of adequate nutrition needed to preserve lean muscle and replace losses from PD. Pt is agreeable to drink supplements in hospital.     Wt Readings from Last 15 Encounters:  06/28/23 101 kg  06/08/23 98.4 kg  05/29/23 95.6 kg  11/19/22 100.2 kg  10/19/22 97.3 kg  09/28/22 105.7 kg  08/18/22 104.3 kg  08/16/22 104.3 kg  07/28/22 104.1 kg  11/02/21 101 kg  10/05/20 101.7 kg  05/19/14 107.2 kg  11/13/13 108.4 kg  10/30/13 108.6 kg  06/03/12 108 kg    Body mass index is 30.19 kg/m. Patient meets criteria for overweight based on current BMI.   Nutrition-Focused physical exam completed. Findings are no fat depletions, no muscle depletions and mild edema in BLE.   Current diet order is renal, patient is consuming approximately 90-100% of meals at this time. Labs and medications reviewed.   RD will add Nepro Shake po BID, each supplement provides 425 kcal and 19 grams protein  RD will add rena-vit po daily   No further nutrition interventions warranted at this time. If nutrition issues arise, please consult RD.   Torrance Freestone MS, RD, LDN If unable to be reached, please send secure chat to "RD inpatient" available from 8:00a-4:00p daily

## 2023-06-28 NOTE — Inpatient Diabetes Management (Signed)
 Inpatient Diabetes Program Recommendations  AACE/ADA: New Consensus Statement on Inpatient Glycemic Control (2015)  Target Ranges:  Prepandial:   less than 140 mg/dL      Peak postprandial:   less than 180 mg/dL (1-2 hours)      Critically ill patients:  140 - 180 mg/dL   Lab Results  Component Value Date   GLUCAP 153 (H) 06/28/2023   HGBA1C 8.6 (H) 06/27/2023    Review of Glycemic Control  Latest Reference Range & Units 06/27/23 04:06 06/27/23 04:41 06/27/23 08:23 06/27/23 09:06 06/27/23 13:06 06/27/23 16:08 06/27/23 20:21 06/28/23 00:47 06/28/23 05:21 06/28/23 08:30  Glucose-Capillary 70 - 99 mg/dL 38 (LL) 161 (H) 62 (L) 119 (H) 160 (H) 98 182 (H) 270 (H) 155 (H) 153 (H)   Diabetes history: DM  Outpatient Diabetes medications:  FSL2 sensor Novolog  6-18 units tid with meals Lantus  40 units bid Current orders for Inpatient glycemic control:  Novolog  0-6 units tid with meals and HS  Inpatient Diabetes Program Recommendations:    Note low blood sugars. Semglee  held.  If blood sugars increase >150 mg/dL, consider restarting a portion of patient's basal insulin , Semglee  20 units daily.   Thanks,  Josefa Ni, RN, BC-ADM Inpatient Diabetes Coordinator Pager [phone]  (8a-5p)

## 2023-06-28 NOTE — Progress Notes (Signed)
   06/28/23 0808  Peritoneal Catheter Mid lower abdomen  No placement date or time found.   Catheter Location: Mid lower abdomen  Site Assessment Clean, Dry, Intact  Drainage Description None  Catheter status Deaccessed;Clamped  Dressing Gauze/Drain sponge  Dressing Status Clean, Dry, Intact  Completion  Treatment Status Complete  Initial Drain Volume 3  Average Dwell Time-Hour(s) 1  Average Dwell Time-Min(s) 30  Average Drain Time 30  Total Therapy Volume 8000  Total Therapy Time-Hour(s) 8  Total Therapy Time-Min(s) 42  Effluent Appearance Yellow;Clear  Fluid Balance - CCPD  Total UF (+ value on cycler, pt loss) 725 mL  Procedure Comments  Tolerated treatment well? Yes   PD post treatment note  PD treatment completed. Patient tolerated treatment well. PD effluent is clear. No specimen collected.  PD exit site clean, dry and intact. Patient is awake, oriented and in no acute distress.  Report given to bedside nurse.

## 2023-06-28 NOTE — Progress Notes (Signed)
 Psychosocial Progressive/Outcome: ANOx4, calm and cooperative  Pain/Comfort Progression/Outcome: Pt did not complain of pain during shift  Clinical Progression/Outcome: Adequate fluid and diet intake  Independent in positioning  Voids to urinal or BR as needed No BM reported from pt  Dressing D/C/I Slept in between care Maintained safety

## 2023-06-28 NOTE — Progress Notes (Signed)
   06/28/23 1925  Peritoneal Catheter Mid lower abdomen  No placement date or time found.   Catheter Location: Mid lower abdomen  Site Assessment Clean, Dry, Intact  Drainage Description None  Catheter status Accessed  Dressing Gauze/Drain sponge  Dressing Status Clean, Dry, Intact  Dressing Intervention New dressing/dressing changed  Cycler Setup  Total Number of Night Cycles 4  Night Fill Volume 2500  Dianeal Solution Dextrose  2.5% in 6000 mL Low Cal/Low Mag  Night Dwell Time per Cycle - Hour(s) 1  Night Dwell Time per Cycle - Minute(s) 30  Night Time Therapy - Minute(s) 23  Night Time Therapy - Hour(s) 8  Minimum Initial Drain Volume 0  Maximum Peritoneal Volume 3750  Night/Total Therapy Volume 91478  Day Exchange No  Completion  Treatment Status Started  Procedure Comments  Tolerated treatment well? Yes  Peritoneal Dialysis Comments Pt. Voice no complaints  Hand-off documentation  Hand-off Given Given to shift RN/LPN  Hand-off Received Received from shift RN/LPN  Report received from (Full Name) Ella Gun RN   PD tx initation note:   Pre TX VS: 98.0, 77, 20, 180/69  Pre TX weight: 99.4 kg  PD treatment initiated via aseptic technique. Consent signed and in chart. Patient is alert and oriented. No complaints of pain. No specimen collected. PD exit site clean, dry and intact. Gentamycin and new dressing applied. Bedside RN educated on PD machine and how to contact tech support when PD machine alarms.

## 2023-06-29 DIAGNOSIS — A499 Bacterial infection, unspecified: Secondary | ICD-10-CM | POA: Diagnosis not present

## 2023-06-29 DIAGNOSIS — Z1612 Extended spectrum beta lactamase (ESBL) resistance: Secondary | ICD-10-CM | POA: Diagnosis not present

## 2023-06-29 LAB — CBC
HCT: 28.7 % — ABNORMAL LOW (ref 39.0–52.0)
Hemoglobin: 9.3 g/dL — ABNORMAL LOW (ref 13.0–17.0)
MCH: 27 pg (ref 26.0–34.0)
MCHC: 32.4 g/dL (ref 30.0–36.0)
MCV: 83.2 fL (ref 80.0–100.0)
Platelets: 189 10*3/uL (ref 150–400)
RBC: 3.45 MIL/uL — ABNORMAL LOW (ref 4.22–5.81)
RDW: 15.9 % — ABNORMAL HIGH (ref 11.5–15.5)
WBC: 6.3 10*3/uL (ref 4.0–10.5)
nRBC: 0.8 % — ABNORMAL HIGH (ref 0.0–0.2)

## 2023-06-29 LAB — GLUCOSE, CAPILLARY
Glucose-Capillary: 194 mg/dL — ABNORMAL HIGH (ref 70–99)
Glucose-Capillary: 201 mg/dL — ABNORMAL HIGH (ref 70–99)
Glucose-Capillary: 295 mg/dL — ABNORMAL HIGH (ref 70–99)
Glucose-Capillary: 343 mg/dL — ABNORMAL HIGH (ref 70–99)
Glucose-Capillary: 194 mg/dL — ABNORMAL HIGH (ref 70–99)
Glucose-Capillary: 238 mg/dL — ABNORMAL HIGH (ref 70–99)
Glucose-Capillary: 281 mg/dL — ABNORMAL HIGH (ref 70–99)

## 2023-06-29 LAB — RENAL FUNCTION PANEL
Albumin: 2.4 g/dL — ABNORMAL LOW (ref 3.5–5.0)
Anion gap: 8 (ref 5–15)
BUN: 48 mg/dL — ABNORMAL HIGH (ref 8–23)
CO2: 21 mmol/L — ABNORMAL LOW (ref 22–32)
Calcium: 8.3 mg/dL — ABNORMAL LOW (ref 8.9–10.3)
Chloride: 109 mmol/L (ref 98–111)
Creatinine, Ser: 3.87 mg/dL — ABNORMAL HIGH (ref 0.61–1.24)
GFR, Estimated: 15 mL/min — ABNORMAL LOW (ref >60)
Glucose, Bld: 204 mg/dL — ABNORMAL HIGH (ref 70–99)
Phosphorus: 3.1 mg/dL (ref 2.5–4.6)
Potassium: 3.2 mmol/L — ABNORMAL LOW (ref 3.5–5.1)
Sodium: 138 mmol/L (ref 135–145)

## 2023-06-29 LAB — MAGNESIUM: Magnesium: 2.1 mg/dL (ref 1.7–2.4)

## 2023-06-29 LAB — PATHOLOGIST SMEAR REVIEW: Path Review: NEGATIVE

## 2023-06-29 MED ORDER — DELFLEX-LC/2.5% DEXTROSE 394 MOSM/L IP SOLN: INTRAPERITONEAL | Status: DC

## 2023-06-29 MED ORDER — POTASSIUM CHLORIDE CRYS ER 20 MEQ PO TBCR
40.0000 meq | EXTENDED_RELEASE_TABLET | Freq: Once | ORAL | Status: AC
  Administered 2023-06-29: 40 meq via ORAL
  Filled 2023-06-29: qty 2

## 2023-06-29 MED ORDER — INSULIN GLARGINE-YFGN 100 UNIT/ML ~~LOC~~ SOLN
20.0000 [IU] | Freq: Every day | SUBCUTANEOUS | Status: DC
  Administered 2023-06-29 – 2023-07-01 (×3): 20 [IU] via SUBCUTANEOUS
  Filled 2023-06-29 (×3): qty 0.2

## 2023-06-29 NOTE — Plan of Care (Signed)

## 2023-06-29 NOTE — Progress Notes (Addendum)
 PROGRESS NOTE  Carlos SAEPHAN WGN:[id number] DOB: Oct 09, 1940 DOA: 06/24/2023 PCP: Suan Elm, MD   LOS: 5 days   Brief narrative:  Patient is an 83 year old male with past medical history significant for recent admission for UTI secondary to E. coli ESBL (05/26/2023 to 05/30/2023), end-stage renal disease on peritoneal dialysis, hypertension, hyperlipidemia, type 2 diabetes mellitus and OSA on CPAP presented to the hospital with fever and chills.  Of note patient had recently completed meropenem  on 06/05/2023.  Blood cultures and urine culture with ESBL E. coli.  CT scan of the abdomen nonrevealing.  ID on board.  2D echocardiogram negative for vegetation.  Repeat blood culture pending and is currently on IV meropenem .  Assessment/Plan: Principal Problem:   ESBL (extended spectrum beta-lactamase) producing bacteria infection Active Problems:   Sepsis (HCC)   Bacteremia  Relapsed urinary tract infection secondary to ESBL E. coli: Sepsis/bacteremia  History recent ESBL E. Coli UTI:  CT abdomen pelvis without contrast does have nonspecific perinephric stranding; no acute abnormality noted.  Suspect relapse UTI with possible ESBL species.  Seen by infectious disease.  At this time, patient is on IV meropenem  and ID plans to continue for 2 more days complete 7-day course.Carlos Gill Urine culture and blood culture with ESBL E. coli.  Repeat blood cultures from 06/26/2023 is negative so far.  2D echocardiogram from 06/26/2023 with LV ejection fraction of 50 to 55% with grade 1 diastolic dysfunction.  No evidence of valvular vegetation noted.   ESRD on PD:  -Nephrology on board for hemodialysis needs..   Hypokalemia: Potassium 3.2. today.  Will replenish with 40 milliequivalent of potassium today.  Check BMP in AM.   Diabetes type 2 with episode of hypoglycemia, level 2 Improved at this time.  On sliding scale insulin    Hypertension:  Still elevated.  Continue hydralazine  100 mg 3 times daily,  metoprolol  100/75 mg AM/PM.  Might need adjustment if not improved.   Hyperlipidemia:  Continue Zetia    OSA:  Continue CPAP   BPH: - Continue tamsulosin   Chronic Diastolic Heart Failure  On peritoneal dialysis for volume management  Trace pericardial effusion  Aortic atherosclerosis  Incidental findings.   Patient is able to ambulate well as per the nursing staff.  DVT prophylaxis: heparin  injection 5,000 Units Start: 06/25/23 0600   Disposition: Likely home in 2 days.  Status is: Inpatient  Remains inpatient appropriate because: IV antibiotic completion,    Code Status:     Code Status: Full Code  Family Communication: None at bedside.  I tried to call the patient's spouse couple of times and grandson on the phone but was unable to reach them today.  Consultants: Infectious disease Nephrology  Procedures: Peritoneal dialysis  Anti-infectives:  Meropenem  IV  Anti-infectives (From admission, onward)    Start     Dose/Rate Route Frequency Ordered Stop   06/25/23 0200  meropenem  (MERREM ) 500 mg in sodium chloride  0.9 % 100 mL IVPB        500 mg 200 mL/hr over 30 Minutes Intravenous Every 24 hours 06/25/23 0055 07/02/23 0959   06/25/23 0000  vancomycin (VANCOREADY) IVPB 2000 mg/400 mL        2,000 mg 200 mL/hr over 120 Minutes Intravenous  Once 06/24/23 2354 06/25/23 0356   06/25/23 0000  cefTRIAXone  (ROCEPHIN ) 2 g in sodium chloride  0.9 % 100 mL IVPB  Status:  Discontinued        2 g 200 mL/hr over 30 Minutes Intravenous Every 24 hours 06/24/23 2354  06/25/23 0032       Subjective: Today, patient was seen and examined at bedside.  Patient denies fever, chills, abdominal pain, nausea or vomiting.     Objective: Vitals:   06/29/23 0846 06/29/23 0854  BP: (!) 150/64 (!) 160/73  Pulse: 72   Resp:  20  Temp:  98.3 F (36.8 C)  SpO2:  100%    Intake/Output Summary (Last 24 hours) at 06/29/2023 1048 Last data filed at 06/28/2023 1700 Gross per 24 hour   Intake 560 ml  Output --  Net 560 ml   Filed Weights   06/25/23 1751 06/28/23 1520 06/29/23 0500  Weight: 101.5 kg 101 kg 100.1 kg   Body mass index is 29.93 kg/m.   Physical Exam:  GENERAL: Patient is alert awake and oriented. Not in obvious distress.  HENT: No scleral pallor or icterus. Pupils equally reactive to light. Oral mucosa is moist NECK: is supple, no gross swelling noted. CHEST: Clear to auscultation. No crackles or wheezes.  Right chest wall port in place CVS: S1 and S2 heard, no murmur. Regular rate and rhythm.  ABDOMEN: Soft, non-tender, bowel sounds are present.  Peritoneal dialysis catheter in place EXTREMITIES: No edema.  Left upper extremity fistula. CNS: Cranial nerves are intact. No focal motor deficits. SKIN: warm and dry without rashes.  Data Review: I have personally reviewed the following laboratory data and studies,  CBC: Recent Labs  Lab 06/24/23 1818 06/25/23 0815 06/29/23 0623  WBC 7.1 8.4 6.3  NEUTROABS 5.5 6.9  --   HGB 10.9* 9.8* 9.3*  HCT 33.8* 30.0* 28.7*  MCV 83.5 83.3 83.2  PLT 161 134* 189   Basic Metabolic Panel: Recent Labs  Lab 06/25/23 0718 06/25/23 0815 06/26/23 0735 06/26/23 1304 06/27/23 0510 06/28/23 0450 06/29/23 0623  NA 136  --  136  --  139 141 138  K 2.8*  --  2.6* 3.2* 2.8* 3.1* 3.2*  CL 103  --  106  --  110 111 109  CO2 20*  --  22  --  21* 22 21*  GLUCOSE 199*  --  59*  --  99 159* 204*  BUN 54*  --  54*  --  56* 53* 48*  CREATININE 4.38*  --  4.16*  --  4.05* 3.88* 3.87*  CALCIUM 8.2*  --  7.9*  --  8.2* 8.4* 8.3*  MG  --  1.5*  --   --  1.7 2.1 2.1  PHOS 3.6  --  3.4  --  3.2 2.8 3.1   Liver Function Tests: Recent Labs  Lab 06/25/23 0718 06/26/23 0735 06/27/23 0510 06/28/23 0450 06/29/23 0623  ALBUMIN 2.7* 2.4* 2.4* 2.3* 2.4*   No results for input(s): "LIPASE", "AMYLASE" in the last 168 hours. No results for input(s): "AMMONIA" in the last 168 hours. Cardiac Enzymes: No results for  input(s): "CKTOTAL", "CKMB", "CKMBINDEX", "TROPONINI" in the last 168 hours. BNP (last 3 results) Recent Labs    10/17/22 1513  BNP 1,115.0*    ProBNP (last 3 results) No results for input(s): "PROBNP" in the last 8760 hours.  CBG: Recent Labs  Lab 06/28/23 1137 06/28/23 1644 06/28/23 2155 06/29/23 0652 06/29/23 0817  GLUCAP 284* 189* 270* 194* 201*   Recent Results (from the past 240 hours)  Blood culture (routine x 2)     Status: Abnormal   Collection Time: 06/24/23 10:45 PM   Specimen: BLOOD  Result Value Ref Range Status   Specimen Description BLOOD RIGHT  ANTECUBITAL  Final   Special Requests   Final    BOTTLES DRAWN AEROBIC AND ANAEROBIC Blood Culture adequate volume   Culture  Setup Time   Final    GRAM NEGATIVE RODS AEROBIC BOTTLE ONLY CRITICAL VALUE NOTED.  VALUE IS CONSISTENT WITH PREVIOUSLY REPORTED AND CALLED VALUE.    Culture (A)  Final    ESCHERICHIA COLI SUSCEPTIBILITIES PERFORMED ON PREVIOUS CULTURE WITHIN THE LAST 5 DAYS. Performed at [facility], [address], [place], [place] [address]    Report Status 06/28/2023 FINAL  Final  Blood culture (routine x 2)     Status: Abnormal   Collection Time: 06/24/23 10:58 PM   Specimen: BLOOD RIGHT HAND  Result Value Ref Range Status   Specimen Description BLOOD RIGHT HAND  Final   Special Requests   Final    BOTTLES DRAWN AEROBIC AND ANAEROBIC Blood Culture adequate volume   Culture  Setup Time   Final    GRAM NEGATIVE RODS AEROBIC BOTTLE ONLY CRITICAL RESULT CALLED TO, READ BACK BY AND VERIFIED WITH: Pharmd Nathan G on [id number] @1422  by SM Performed at [facility], [address], [place], [place] [address]    Culture (A)  Final    ESCHERICHIA COLI Confirmed Extended Spectrum Beta-Lactamase Producer (ESBL).  In bloodstream infections from ESBL organisms, carbapenems are preferred over piperacillin/tazobactam. They are shown to have a lower risk of mortality.    Report Status 06/27/2023  FINAL  Final   Organism ID, Bacteria ESCHERICHIA COLI  Final      Susceptibility   Escherichia coli - MIC*    AMPICILLIN >=32 RESISTANT Resistant     CEFEPIME 16 RESISTANT Resistant     CEFTAZIDIME RESISTANT Resistant     CEFTRIAXONE  >=64 RESISTANT Resistant     CIPROFLOXACIN  1 RESISTANT Resistant     GENTAMICIN  >=16 RESISTANT Resistant     IMIPENEM <=0.25 SENSITIVE Sensitive     TRIMETH/SULFA >=320 RESISTANT Resistant     AMPICILLIN/SULBACTAM >=32 RESISTANT Resistant     PIP/TAZO 8 SENSITIVE Sensitive ug/mL    * ESCHERICHIA COLI  Urine Culture     Status: Abnormal   Collection Time: 06/24/23 10:58 PM   Specimen: Urine, Random  Result Value Ref Range Status   Specimen Description URINE, RANDOM  Final   Special Requests   Final    NONE Reflexed from [id number] Performed at [facility], [address], [place], [place] [address]    Culture (A)  Final    >=100,000 COLONIES/mL ESCHERICHIA COLI Confirmed Extended Spectrum Beta-Lactamase Producer (ESBL).  In bloodstream infections from ESBL organisms, carbapenems are preferred over piperacillin/tazobactam. They are shown to have a lower risk of mortality.    Report Status 06/27/2023 FINAL  Final   Organism ID, Bacteria ESCHERICHIA COLI (A)  Final      Susceptibility   Escherichia coli - MIC*    AMPICILLIN >=32 RESISTANT Resistant     CEFAZOLIN  >=64 RESISTANT Resistant     CEFEPIME 16 RESISTANT Resistant     CEFTRIAXONE  >=64 RESISTANT Resistant     CIPROFLOXACIN  1 RESISTANT Resistant     GENTAMICIN  >=16 RESISTANT Resistant     IMIPENEM <=0.25 SENSITIVE Sensitive     NITROFURANTOIN 64 INTERMEDIATE Intermediate     TRIMETH/SULFA >=320 RESISTANT Resistant     AMPICILLIN/SULBACTAM >=32 RESISTANT Resistant     PIP/TAZO 8 SENSITIVE Sensitive ug/mL    * >=100,000 COLONIES/mL ESCHERICHIA COLI  Blood Culture ID Panel (Reflexed)  Status: Abnormal   Collection Time: 06/24/23 10:58 PM  Result Value Ref Range Status   Enterococcus  faecalis NOT DETECTED NOT DETECTED Final   Enterococcus Faecium NOT DETECTED NOT DETECTED Final   Listeria monocytogenes NOT DETECTED NOT DETECTED Final   Staphylococcus species NOT DETECTED NOT DETECTED Final   Staphylococcus aureus (BCID) NOT DETECTED NOT DETECTED Final   Staphylococcus epidermidis NOT DETECTED NOT DETECTED Final   Staphylococcus lugdunensis NOT DETECTED NOT DETECTED Final   Streptococcus species NOT DETECTED NOT DETECTED Final   Streptococcus agalactiae NOT DETECTED NOT DETECTED Final   Streptococcus pneumoniae NOT DETECTED NOT DETECTED Final   Streptococcus pyogenes NOT DETECTED NOT DETECTED Final   A.calcoaceticus-baumannii NOT DETECTED NOT DETECTED Final   Bacteroides fragilis NOT DETECTED NOT DETECTED Final   Enterobacterales DETECTED (A) NOT DETECTED Final    Comment: Enterobacterales represent a large order of gram negative bacteria, not a single organism. CRITICAL RESULT CALLED TO, READ BACK BY AND VERIFIED WITH: Pharmd Nathan G on [id number] @1422  by SM    Enterobacter cloacae complex NOT DETECTED NOT DETECTED Final   Escherichia coli DETECTED (A) NOT DETECTED Final    Comment: CRITICAL RESULT CALLED TO, READ BACK BY AND VERIFIED WITH: Pharmd Nathan G on [id number] @1422  by SM    Klebsiella aerogenes NOT DETECTED NOT DETECTED Final   Klebsiella oxytoca NOT DETECTED NOT DETECTED Final   Klebsiella pneumoniae NOT DETECTED NOT DETECTED Final   Proteus species NOT DETECTED NOT DETECTED Final   Salmonella species NOT DETECTED NOT DETECTED Final   Serratia marcescens NOT DETECTED NOT DETECTED Final   Haemophilus influenzae NOT DETECTED NOT DETECTED Final   Neisseria meningitidis NOT DETECTED NOT DETECTED Final   Pseudomonas aeruginosa NOT DETECTED NOT DETECTED Final   Stenotrophomonas maltophilia NOT DETECTED NOT DETECTED Final   Candida albicans NOT DETECTED NOT DETECTED Final   Candida auris NOT DETECTED NOT DETECTED Final   Candida glabrata NOT DETECTED NOT  DETECTED Final   Candida krusei NOT DETECTED NOT DETECTED Final   Candida parapsilosis NOT DETECTED NOT DETECTED Final   Candida tropicalis NOT DETECTED NOT DETECTED Final   Cryptococcus neoformans/gattii NOT DETECTED NOT DETECTED Final   CTX-M ESBL DETECTED (A) NOT DETECTED Final    Comment: CRITICAL RESULT CALLED TO, READ BACK BY AND VERIFIED WITH: Pharmd Nathan G on [phone] @1422  by SM (NOTE) Extended spectrum beta-lactamase detected. Recommend a carbapenem as initial therapy.      Carbapenem resistance IMP NOT DETECTED NOT DETECTED Final   Carbapenem resistance KPC NOT DETECTED NOT DETECTED Final   Carbapenem resistance NDM NOT DETECTED NOT DETECTED Final   Carbapenem resist OXA 48 LIKE NOT DETECTED NOT DETECTED Final   Carbapenem resistance VIM NOT DETECTED NOT DETECTED Final    Comment: Performed at [facility], [address], [place], [place] [address]  Body fluid culture w Gram Stain     Status: None   Collection Time: 06/24/23 11:48 PM   Specimen: Peritoneal Dialysate; Body Fluid  Result Value Ref Range Status   Specimen Description PERITONEAL DIALYSATE  Final   Special Requests NONE  Final   Gram Stain NO WBC SEEN NO ORGANISMS SEEN   Final   Culture   Final    NO GROWTH 3 DAYS Performed at [facility], [address], [place], [place] [address]    Report Status 06/28/2023 FINAL  Final  Resp panel by RT-PCR (RSV, Flu A&B, Covid) Anterior Nasal Swab     Status:  None   Collection Time: 06/25/23 12:09 AM   Specimen: Anterior Nasal Swab  Result Value Ref Range Status   SARS Coronavirus 2 by RT PCR NEGATIVE NEGATIVE Final   Influenza A by PCR NEGATIVE NEGATIVE Final   Influenza B by PCR NEGATIVE NEGATIVE Final    Comment: (NOTE) The Xpert Xpress SARS-CoV-2/FLU/RSV plus assay is intended as an aid in the diagnosis of influenza from Nasopharyngeal swab specimens and should not be used as a sole basis for treatment. Nasal washings and aspirates are  unacceptable for Xpert Xpress SARS-CoV-2/FLU/RSV testing.  Fact Sheet for Patients: BloggerCourse.com  Fact Sheet for Healthcare Providers: SeriousBroker.it  This test is not yet approved or cleared by the [place]  FDA and has been authorized for detection and/or diagnosis of SARS-CoV-2 by FDA under an Emergency Use Authorization (EUA). This EUA will remain in effect (meaning this test can be used) for the duration of the COVID-19 declaration under Section 564(b)(1) of the Act, 21 U.S.C. section 360bbb-3(b)(1), unless the authorization is terminated or revoked.     Resp Syncytial Virus by PCR NEGATIVE NEGATIVE Final    Comment: (NOTE) Fact Sheet for Patients: BloggerCourse.com  Fact Sheet for Healthcare Providers: SeriousBroker.it  This test is not yet approved or cleared by the [place]  FDA and has been authorized for detection and/or diagnosis of SARS-CoV-2 by FDA under an Emergency Use Authorization (EUA). This EUA will remain in effect (meaning this test can be used) for the duration of the COVID-19 declaration under Section 564(b)(1) of the Act, 21 U.S.C. section 360bbb-3(b)(1), unless the authorization is terminated or revoked.  Performed at [facility], [address], [place], [place] [address]   Culture, blood (Routine X 2) w Reflex to ID Panel     Status: None (Preliminary result)   Collection Time: 06/26/23  2:28 PM   Specimen: BLOOD RIGHT ARM  Result Value Ref Range Status   Specimen Description BLOOD RIGHT ARM  Final   Special Requests   Final    BOTTLES DRAWN AEROBIC AND ANAEROBIC Blood Culture adequate volume   Culture   Final    NO GROWTH 3 DAYS Performed at [facility], [address] [place], [place] [address]    Report Status PENDING  Incomplete  Culture, blood (Routine X 2) w Reflex to ID Panel     Status: None (Preliminary  result)   Collection Time: 06/26/23  2:30 PM   Specimen: BLOOD RIGHT HAND  Result Value Ref Range Status   Specimen Description BLOOD RIGHT HAND  Final   Special Requests   Final    BOTTLES DRAWN AEROBIC AND ANAEROBIC Blood Culture adequate volume   Culture   Final    NO GROWTH 3 DAYS Performed at [facility], [address] [place], [place] [address]    Report Status PENDING  Incomplete     Studies: No results found.     Faruq Rosenberger, MD  [facility] 06/29/2023  If 7PM-7AM, please contact night-coverage

## 2023-06-29 NOTE — Progress Notes (Signed)
 Psychosocial Progressive/Outcome: ANOx4, calm and cooperative   Pain/Comfort Progression/Outcome: Pt did not complain of pain during shift   Clinical Progression/Outcome: Adequate fluid and diet intake  Independent in positioning  Sat up in chair  Voids to urinal or BR as needed No BM reported from pt  Dressing D/C/I Slept in between care Maintained safety

## 2023-06-29 NOTE — Inpatient Diabetes Management (Signed)
 Inpatient Diabetes Program Recommendations  AACE/ADA: New Consensus Statement on Inpatient Glycemic Control (2015)  Target Ranges:  Prepandial:   less than 140 mg/dL      Peak postprandial:   less than 180 mg/dL (1-2 hours)      Critically ill patients:  140 - 180 mg/dL   Lab Results  Component Value Date   GLUCAP 343 (H) 06/29/2023   HGBA1C 8.6 (H) 06/27/2023    Review of Glycemic Control  Latest Reference Range & Units 06/28/23 21:55 06/29/23 06:52 06/29/23 08:17 06/29/23 11:48 06/29/23 12:15  Glucose-Capillary 70 - 99 mg/dL 875 (H) 643 (H) 329 (H) 295 (H) 343 (H)  (H): Data is abnormally high Diabetes history: DM  Outpatient Diabetes medications:  FSL2 sensor Novolog  6-18 units tid with meals Lantus  40 units bid Current orders for Inpatient glycemic control:  Novolog  0-6 units tid with meals and HS   Inpatient Diabetes Program Recommendations:      If blood sugars increase >150 mg/dL, consider restarting a portion of patient's basal insulin , Semglee  20 units daily.  Thanks, Marjo Sievert, MSN, RNC-OB Diabetes Coordinator [phone] (8a-5p)

## 2023-06-29 NOTE — Progress Notes (Signed)
 Contacted by pt's PD RN yesterday with [facility] for update on pt. Update provided and faxed requested clinicals to RN. Will contact RN when pt's d/c date is known. Will assist as needed.   Lauraine Polite Renal Navigator [phone]

## 2023-06-29 NOTE — Progress Notes (Signed)
 [facility] Progress Note  Subjective:  No new c/o's  Vitals:   06/28/23 1941 06/29/23 0445 06/29/23 0500 06/29/23 0839  BP: (!) 172/78 (!) 149/64  (!) 150/64  Pulse: 65 70  72  Resp:    16  Temp: 98.9 F (37.2 C) 98.8 F (37.1 C)  98.4 F (36.9 C)  TempSrc: Oral Oral  Oral  SpO2: 98% 100%    Weight:   100.1 kg   Height:        Exam: General elderly male in bed in no acute distress Lungs clear to auscultation bilaterally  Heart S1S2 no rub Abdomen soft nontender nondistended; PD catheter intact Extremities no edema lower extremities Neuro - alert and oriented x 3     OP PD: davita Bynum    4 cycles, 2.5 L fill volume, usually uses 2.5% fluids  Usual wt ~ 97kg    Assessment/ Plan: ESBL E.Coli UTI: afebrile now, needs IV meropenem  for 7d per ID  ESRD: on PD w/ davita [place]. Continue PD nightly while here.  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  Secondary hyperparathyroidism: CCa/ phos stable, cont vdra/ binder   [name] MD  [facility] 06/29/2023, 8:44 AM  Recent Labs  Lab 06/25/23 0815 06/26/23 0735 06/28/23 0450 06/29/23 0623  HGB 9.8*  --   --  9.3*  ALBUMIN  --    < > 2.3* 2.4*  CALCIUM  --    < > 8.4* 8.3*  PHOS  --    < > 2.8 3.1  CREATININE  --    < > 3.88* 3.87*  K  --    < > 3.1* 3.2*   < > = values in this interval not displayed.   No results for input(s): "IRON", "TIBC", "FERRITIN" in the last 168 hours. Inpatient medications:  aspirin  EC  81 mg Oral q morning   calcitRIOL   1 mcg Oral Daily   ezetimibe   10 mg Oral Daily   feeding supplement (NEPRO CARB STEADY)  237 mL Oral BID BM   gabapentin   100 mg Oral Daily   gabapentin   200 mg Oral QHS   gentamicin  cream  1 Application Topical Daily   heparin   5,000 Units Subcutaneous Q8H   hydrALAZINE   100 mg Oral TID   insulin  aspart  0-5 Units Subcutaneous QHS   insulin  aspart  0-6 Units  Subcutaneous TID WC   metoprolol  tartrate  100 mg Oral Daily   metoprolol  tartrate  75 mg Oral QHS   multivitamin  1 tablet Oral QHS   pantoprazole   40 mg Oral Daily   potassium chloride   40 mEq Oral Once   sodium chloride  flush  3 mL Intravenous Q12H   tamsulosin   0.4 mg Oral QHS    dialysis solution 2.5% low-MG/low-CA     meropenem  (MERREM ) IV Stopped (06/28/23 0542)   acetaminophen , albuterol , dextrose , melatonin, polyethylene glycol

## 2023-06-30 DIAGNOSIS — Z1612 Extended spectrum beta lactamase (ESBL) resistance: Secondary | ICD-10-CM | POA: Diagnosis not present

## 2023-06-30 DIAGNOSIS — A499 Bacterial infection, unspecified: Secondary | ICD-10-CM | POA: Diagnosis not present

## 2023-06-30 LAB — RENAL FUNCTION PANEL
Albumin: 2.5 g/dL — ABNORMAL LOW (ref 3.5–5.0)
Anion gap: 6 (ref 5–15)
BUN: 50 mg/dL — ABNORMAL HIGH (ref 8–23)
CO2: 22 mmol/L (ref 22–32)
Calcium: 8.8 mg/dL — ABNORMAL LOW (ref 8.9–10.3)
Chloride: 111 mmol/L (ref 98–111)
Creatinine, Ser: 3.96 mg/dL — ABNORMAL HIGH (ref 0.61–1.24)
GFR, Estimated: 14 mL/min — ABNORMAL LOW (ref >60)
Glucose, Bld: 221 mg/dL — ABNORMAL HIGH (ref 70–99)
Phosphorus: 2.9 mg/dL (ref 2.5–4.6)
Potassium: 3.5 mmol/L (ref 3.5–5.1)
Sodium: 139 mmol/L (ref 135–145)

## 2023-06-30 LAB — GLUCOSE, CAPILLARY
Glucose-Capillary: 191 mg/dL — ABNORMAL HIGH (ref 70–99)
Glucose-Capillary: 195 mg/dL — ABNORMAL HIGH (ref 70–99)
Glucose-Capillary: 203 mg/dL — ABNORMAL HIGH (ref 70–99)
Glucose-Capillary: 223 mg/dL — ABNORMAL HIGH (ref 70–99)
Glucose-Capillary: 230 mg/dL — ABNORMAL HIGH (ref 70–99)
Glucose-Capillary: 269 mg/dL — ABNORMAL HIGH (ref 70–99)

## 2023-06-30 MED ORDER — DELFLEX-LC/2.5% DEXTROSE 394 MOSM/L IP SOLN: INTRAPERITONEAL | Status: DC

## 2023-06-30 NOTE — Progress Notes (Signed)
 [facility] Progress Note  Subjective:  No new c/o's, seen in room  Vitals:   06/30/23 0534 06/30/23 0816 06/30/23 0841 06/30/23 0918  BP: (!) 154/64 (!) 171/64 (!) 172/65   Pulse: (!) 58 64 66   Resp:   17   Temp: 98.4 F (36.9 C)  (!) 100.5 F (38.1 C) 97.8 F (36.6 C)  TempSrc: Oral  Oral Oral  SpO2: 98%  100%   Weight:      Height:        Exam: General elderly male in bed in no acute distress Lungs clear to auscultation bilaterally  Heart S1S2 no rub Abdomen soft nontender nondistended; PD catheter intact Extremities no edema lower extremities Neuro - alert and oriented x 3     OP PD: davita [place]    4 cycles, 2.5 L fill volume, usually uses 2.5% fluids  Usual wt ~ 97kg    Assessment/ Plan: ESBL E.Coli UTI: needs IV meropenem  for 7d thru 5/30 tomorrow.  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 Cont PD nightly while here.  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  Secondary hyperparathyroidism: CCa/ phos stable, cont vdra/ binder   [name] MD  [facility] 06/30/2023, 1:06 PM  Recent Labs  Lab 06/25/23 0815 06/26/23 0735 06/29/23 0623 06/30/23 0734  HGB 9.8*  --  9.3*  --   ALBUMIN  --    < > 2.4* 2.5*  CALCIUM  --    < > 8.3* 8.8*  PHOS  --    < > 3.1 2.9  CREATININE  --    < > 3.87* 3.96*  K  --    < > 3.2* 3.5   < > = values in this interval not displayed.   No results for input(s): "IRON", "TIBC", "FERRITIN" in the last 168 hours. Inpatient medications:  aspirin  EC  81 mg Oral q morning   calcitRIOL   1 mcg Oral Daily   ezetimibe   10 mg Oral Daily   feeding supplement (NEPRO CARB STEADY)  237 mL Oral BID BM   gabapentin   100 mg Oral Daily   gabapentin   200 mg Oral QHS   gentamicin  cream  1 Application Topical Daily   heparin   5,000 Units Subcutaneous Q8H   hydrALAZINE   100 mg Oral TID   insulin  aspart  0-5 Units Subcutaneous QHS   insulin  aspart  0-6 Units  Subcutaneous TID WC   insulin  glargine-yfgn  20 Units Subcutaneous Daily   metoprolol  tartrate  100 mg Oral Daily   metoprolol  tartrate  75 mg Oral QHS   multivitamin  1 tablet Oral QHS   pantoprazole   40 mg Oral Daily   sodium chloride  flush  3 mL Intravenous Q12H   tamsulosin   0.4 mg Oral QHS    dialysis solution 2.5% low-MG/low-CA     meropenem  (MERREM ) IV 500 mg (06/30/23 0916)   acetaminophen , albuterol , dextrose , melatonin, polyethylene glycol

## 2023-06-30 NOTE — Plan of Care (Signed)

## 2023-06-30 NOTE — Progress Notes (Signed)
 Pt PD treatment completed without issue.  06/30/23 1022  Peritoneal Catheter Mid lower abdomen  No placement date or time found.   Catheter Location: Mid lower abdomen  Site Assessment Clean, Dry, Intact  Drainage Description None  Catheter status Deaccessed  Dressing Gauze/Drain sponge;Occlusive  Dressing Status Clean, Dry, Intact  Dressing Intervention Dressing reinforced  Completion  Treatment Status Complete  Initial Drain Volume 37  Average Dwell Time-Hour(s) 1  Average Dwell Time-Min(s) 30  Average Drain Time 17  Total Therapy Volume [address]  Total Therapy Time-Hour(s) 8  Weight after Drain 220 lb 7.4 oz (100 kg)  Effluent Appearance Yellow;Clear  Fluid Balance - CCPD  Total UF (+ value on cycler, pt loss) 317 mL  Procedure Comments  Tolerated treatment well? Yes  Hand-off documentation  Report given to (Full Name) Michaell Adolph RN

## 2023-06-30 NOTE — Plan of Care (Signed)

## 2023-06-30 NOTE — Progress Notes (Addendum)
 PROGRESS NOTE  [facility]:[id number] DOB: 03-26-1940 DOA: 06/24/2023 PCP: Suan Elm, MD   LOS: 6 days   Brief narrative:  Patient is an 83 year old male with past medical history significant for recent admission for UTI secondary to E. coli ESBL (05/26/2023 to 05/30/2023), end-stage renal disease on peritoneal dialysis, hypertension, hyperlipidemia, type 2 diabetes mellitus and OSA on CPAP presented to the hospital with fever and chills.  Of note patient had recently completed meropenem  on 06/05/2023.  Blood cultures and urine culture with ESBL E. coli.  CT scan of the abdomen nonrevealing.  ID on board.  2D echocardiogram negative for vegetation.    Assessment/Plan: Principal Problem:   ESBL (extended spectrum beta-lactamase) producing bacteria infection Active Problems:   Sepsis (HCC)   Bacteremia  Relapsed urinary tract infection secondary to ESBL E. coli: Sepsis/bacteremia  History recent ESBL E. Coli UTI:  CT abdomen pelvis without contrast does have nonspecific perinephric stranding; no acute abnormality noted.  Suspect relapse UTI with possible ESBL species.  Seen by infectious disease.  At this time, patient is on IV meropenem  and ID plans to continue for 2 more days complete 7-day course.Aaron Aas Urine culture and blood culture with ESBL E. coli.  Repeat blood cultures from 06/26/2023 is negative so far.  2D echocardiogram from 06/26/2023 with LV ejection fraction of 50 to 55% with grade 1 diastolic dysfunction.  No evidence of valvular vegetation noted.  Low-grade fever noted today.  Will continue to monitor.   ESRD on PD:  -Nephrology on board for hemodialysis needs..   Hypokalemia: Potassium 3.5. today.  Replenished during hospitalization.   Diabetes type 2 with episode of hypoglycemia, level 2 Improved at this time.  On sliding scale insulin .  Latest POC glucose of 191   Hypertension:  Still elevated.  Continue hydralazine  100 mg 3 times daily, metoprolol  100/75 mg  AM/PM.  Might need adjustment if not improved.   Hyperlipidemia:  Continue Zetia    OSA:  Continue CPAP   BPH: - Continue tamsulosin   Chronic Diastolic Heart Failure  On peritoneal dialysis for volume management  Trace pericardial effusion  Aortic atherosclerosis  Incidental findings.   Patient is able to ambulate well as per the nursing staff.  DVT prophylaxis: heparin  injection 5,000 Units Start: 06/25/23 0600   Disposition: Likely home on 07/01/2023.  Status is: Inpatient  Remains inpatient appropriate because: IV antibiotic completion,    Code Status:     Code Status: Full Code  Family Communication: None at bedside. Consultants: Infectious disease Nephrology  Procedures: Peritoneal dialysis  Anti-infectives:  Meropenem  IV  Anti-infectives (From admission, onward)    Start     Dose/Rate Route Frequency Ordered Stop   06/25/23 0200  meropenem  (MERREM ) 500 mg in sodium chloride  0.9 % 100 mL IVPB        500 mg 200 mL/hr over 30 Minutes Intravenous Every 24 hours 06/25/23 0055 07/02/23 0959   06/25/23 0000  vancomycin (VANCOREADY) IVPB 2000 mg/400 mL        2,000 mg 200 mL/hr over 120 Minutes Intravenous  Once 06/24/23 2354 06/25/23 0356   06/25/23 0000  cefTRIAXone  (ROCEPHIN ) 2 g in sodium chloride  0.9 % 100 mL IVPB  Status:  Discontinued        2 g 200 mL/hr over 30 Minutes Intravenous Every 24 hours 06/24/23 2354 06/25/23 0032       Subjective: Today, patient was seen and examined at bedside.  Patient without interval complaints.  Denies any nausea vomiting fever  chills or rigor.   Objective: Vitals:   06/30/23 0841 06/30/23 0918  BP: (!) 172/65   Pulse: 66   Resp: 17   Temp: (!) 100.5 F (38.1 C) 97.8 F (36.6 C)  SpO2: 100%     Intake/Output Summary (Last 24 hours) at 06/30/2023 1318 Last data filed at 06/30/2023 1022 Gross per 24 hour  Intake 340.96 ml  Output 317 ml  Net 23.96 ml   Filed Weights   06/28/23 1520 06/29/23 0500  06/30/23 0500  Weight: 101 kg 100.1 kg 100 kg   Body mass index is 29.9 kg/m.   Physical Exam:  GENERAL: Patient is alert awake and oriented. Not in obvious distress.  HENT: No scleral pallor or icterus. Pupils equally reactive to light. Oral mucosa is moist NECK: is supple, no gross swelling noted. CHEST: Clear to auscultation. No crackles or wheezes.  Right chest wall port in place CVS: S1 and S2 heard, no murmur. Regular rate and rhythm.  ABDOMEN: Soft, non-tender, bowel sounds are present.  Peritoneal dialysis catheter in place EXTREMITIES: No edema.  Left upper extremity fistula. CNS: Cranial nerves are intact. No focal motor deficits. SKIN: warm and dry without rashes.  Data Review: I have personally reviewed the following laboratory data and studies,  CBC: Recent Labs  Lab 06/24/23 1818 06/25/23 0815 06/29/23 0623  WBC 7.1 8.4 6.3  NEUTROABS 5.5 6.9  --   HGB 10.9* 9.8* 9.3*  HCT 33.8* 30.0* 28.7*  MCV 83.5 83.3 83.2  PLT 161 134* 189   Basic Metabolic Panel: Recent Labs  Lab 06/25/23 0815 06/26/23 0735 06/26/23 1304 06/27/23 0510 06/28/23 0450 06/29/23 0623 06/30/23 0734  NA  --  136  --  139 141 138 139  K  --  2.6* 3.2* 2.8* 3.1* 3.2* 3.5  CL  --  106  --  110 111 109 111  CO2  --  22  --  21* 22 21* 22  GLUCOSE  --  59*  --  99 159* 204* 221*  BUN  --  54*  --  56* 53* 48* 50*  CREATININE  --  4.16*  --  4.05* 3.88* 3.87* 3.96*  CALCIUM  --  7.9*  --  8.2* 8.4* 8.3* 8.8*  MG 1.5*  --   --  1.7 2.1 2.1  --   PHOS  --  3.4  --  3.2 2.8 3.1 2.9   Liver Function Tests: Recent Labs  Lab 06/26/23 0735 06/27/23 0510 06/28/23 0450 06/29/23 0623 06/30/23 0734  ALBUMIN 2.4* 2.4* 2.3* 2.4* 2.5*   No results for input(s): "LIPASE", "AMYLASE" in the last 168 hours. No results for input(s): "AMMONIA" in the last 168 hours. Cardiac Enzymes: No results for input(s): "CKTOTAL", "CKMB", "CKMBINDEX", "TROPONINI" in the last 168 hours. BNP (last 3  results) Recent Labs    10/17/22 1513  BNP 1,115.0*    ProBNP (last 3 results) No results for input(s): "PROBNP" in the last 8760 hours.  CBG: Recent Labs  Lab 06/29/23 2010 06/30/23 0026 06/30/23 0531 06/30/23 0843 06/30/23 1219  GLUCAP 281* 269* 223* 195* 191*   Recent Results (from the past 240 hours)  Blood culture (routine x 2)     Status: Abnormal   Collection Time: 06/24/23 10:45 PM   Specimen: BLOOD  Result Value Ref Range Status   Specimen Description BLOOD RIGHT ANTECUBITAL  Final   Special Requests   Final    BOTTLES DRAWN AEROBIC AND ANAEROBIC Blood Culture adequate volume  Culture  Setup Time   Final    GRAM NEGATIVE RODS AEROBIC BOTTLE ONLY CRITICAL VALUE NOTED.  VALUE IS CONSISTENT WITH PREVIOUSLY REPORTED AND CALLED VALUE.    Culture (A)  Final    ESCHERICHIA COLI SUSCEPTIBILITIES PERFORMED ON PREVIOUS CULTURE WITHIN THE LAST 5 DAYS. Performed at [facility], [address] [place], [place] [address]    Report Status 06/28/2023 FINAL  Final  Blood culture (routine x 2)     Status: Abnormal   Collection Time: 06/24/23 10:58 PM   Specimen: BLOOD RIGHT HAND  Result Value Ref Range Status   Specimen Description BLOOD RIGHT HAND  Final   Special Requests   Final    BOTTLES DRAWN AEROBIC AND ANAEROBIC Blood Culture adequate volume   Culture  Setup Time   Final    GRAM NEGATIVE RODS AEROBIC BOTTLE ONLY CRITICAL RESULT CALLED TO, READ BACK BY AND VERIFIED WITH: Pharmd Nathan G on [id number] @1422  by SM Performed at [facility], [address] [place], [place] [address]    Culture (A)  Final    ESCHERICHIA COLI Confirmed Extended Spectrum Beta-Lactamase Producer (ESBL).  In bloodstream infections from ESBL organisms, carbapenems are preferred over piperacillin/tazobactam. They are shown to have a lower risk of mortality.    Report Status 06/27/2023 FINAL  Final   Organism ID, Bacteria ESCHERICHIA COLI  Final      Susceptibility    Escherichia coli - MIC*    AMPICILLIN >=32 RESISTANT Resistant     CEFEPIME 16 RESISTANT Resistant     CEFTAZIDIME RESISTANT Resistant     CEFTRIAXONE  >=64 RESISTANT Resistant     CIPROFLOXACIN  1 RESISTANT Resistant     GENTAMICIN  >=16 RESISTANT Resistant     IMIPENEM <=0.25 SENSITIVE Sensitive     TRIMETH/SULFA >=320 RESISTANT Resistant     AMPICILLIN/SULBACTAM >=32 RESISTANT Resistant     PIP/TAZO 8 SENSITIVE Sensitive ug/mL    * ESCHERICHIA COLI  Urine Culture     Status: Abnormal   Collection Time: 06/24/23 10:58 PM   Specimen: Urine, Random  Result Value Ref Range Status   Specimen Description URINE, RANDOM  Final   Special Requests   Final    NONE Reflexed from [id number] Performed at [facility], [address] [place], [place] [address]    Culture (A)  Final    >=100,000 COLONIES/mL ESCHERICHIA COLI Confirmed Extended Spectrum Beta-Lactamase Producer (ESBL).  In bloodstream infections from ESBL organisms, carbapenems are preferred over piperacillin/tazobactam. They are shown to have a lower risk of mortality.    Report Status 06/27/2023 FINAL  Final   Organism ID, Bacteria ESCHERICHIA COLI (A)  Final      Susceptibility   Escherichia coli - MIC*    AMPICILLIN >=32 RESISTANT Resistant     CEFAZOLIN  >=64 RESISTANT Resistant     CEFEPIME 16 RESISTANT Resistant     CEFTRIAXONE  >=64 RESISTANT Resistant     CIPROFLOXACIN  1 RESISTANT Resistant     GENTAMICIN  >=16 RESISTANT Resistant     IMIPENEM <=0.25 SENSITIVE Sensitive     NITROFURANTOIN 64 INTERMEDIATE Intermediate     TRIMETH/SULFA >=320 RESISTANT Resistant     AMPICILLIN/SULBACTAM >=32 RESISTANT Resistant     PIP/TAZO 8 SENSITIVE Sensitive ug/mL    * >=100,000 COLONIES/mL ESCHERICHIA COLI  Blood Culture ID Panel (Reflexed)     Status: Abnormal   Collection Time: 06/24/23 10:58 PM  Result Value Ref Range Status   Enterococcus faecalis NOT DETECTED NOT  DETECTED Final   Enterococcus Faecium NOT DETECTED NOT  DETECTED Final   Listeria monocytogenes NOT DETECTED NOT DETECTED Final   Staphylococcus species NOT DETECTED NOT DETECTED Final   Staphylococcus aureus (BCID) NOT DETECTED NOT DETECTED Final   Staphylococcus epidermidis NOT DETECTED NOT DETECTED Final   Staphylococcus lugdunensis NOT DETECTED NOT DETECTED Final   Streptococcus species NOT DETECTED NOT DETECTED Final   Streptococcus agalactiae NOT DETECTED NOT DETECTED Final   Streptococcus pneumoniae NOT DETECTED NOT DETECTED Final   Streptococcus pyogenes NOT DETECTED NOT DETECTED Final   A.calcoaceticus-baumannii NOT DETECTED NOT DETECTED Final   Bacteroides fragilis NOT DETECTED NOT DETECTED Final   Enterobacterales DETECTED (A) NOT DETECTED Final    Comment: Enterobacterales represent a large order of gram negative bacteria, not a single organism. CRITICAL RESULT CALLED TO, READ BACK BY AND VERIFIED WITH: Pharmd Nathan G on [id number] @1422  by SM    Enterobacter cloacae complex NOT DETECTED NOT DETECTED Final   Escherichia coli DETECTED (A) NOT DETECTED Final    Comment: CRITICAL RESULT CALLED TO, READ BACK BY AND VERIFIED WITH: Pharmd Nathan G on [id number] @1422  by SM    Klebsiella aerogenes NOT DETECTED NOT DETECTED Final   Klebsiella oxytoca NOT DETECTED NOT DETECTED Final   Klebsiella pneumoniae NOT DETECTED NOT DETECTED Final   Proteus species NOT DETECTED NOT DETECTED Final   Salmonella species NOT DETECTED NOT DETECTED Final   Serratia marcescens NOT DETECTED NOT DETECTED Final   Haemophilus influenzae NOT DETECTED NOT DETECTED Final   Neisseria meningitidis NOT DETECTED NOT DETECTED Final   Pseudomonas aeruginosa NOT DETECTED NOT DETECTED Final   Stenotrophomonas maltophilia NOT DETECTED NOT DETECTED Final   Candida albicans NOT DETECTED NOT DETECTED Final   Candida auris NOT DETECTED NOT DETECTED Final   Candida glabrata NOT DETECTED NOT DETECTED Final   Candida krusei NOT DETECTED NOT DETECTED Final   Candida parapsilosis  NOT DETECTED NOT DETECTED Final   Candida tropicalis NOT DETECTED NOT DETECTED Final   Cryptococcus neoformans/gattii NOT DETECTED NOT DETECTED Final   CTX-M ESBL DETECTED (A) NOT DETECTED Final    Comment: CRITICAL RESULT CALLED TO, READ BACK BY AND VERIFIED WITH: Pharmd Nathan G on [phone] @1422  by SM (NOTE) Extended spectrum beta-lactamase detected. Recommend a carbapenem as initial therapy.      Carbapenem resistance IMP NOT DETECTED NOT DETECTED Final   Carbapenem resistance KPC NOT DETECTED NOT DETECTED Final   Carbapenem resistance NDM NOT DETECTED NOT DETECTED Final   Carbapenem resist OXA 48 LIKE NOT DETECTED NOT DETECTED Final   Carbapenem resistance VIM NOT DETECTED NOT DETECTED Final    Comment: Performed at [facility], [address] [place], [place] [address]  Body fluid culture w Gram Stain     Status: None   Collection Time: 06/24/23 11:48 PM   Specimen: Peritoneal Dialysate; Body Fluid  Result Value Ref Range Status   Specimen Description PERITONEAL DIALYSATE  Final   Special Requests NONE  Final   Gram Stain NO WBC SEEN NO ORGANISMS SEEN   Final   Culture   Final    NO GROWTH 3 DAYS Performed at [facility], [address], [place], [place] [address]    Report Status 06/28/2023 FINAL  Final  Resp panel by RT-PCR (RSV, Flu A&B, Covid) Anterior Nasal Swab     Status: None   Collection Time: 06/25/23 12:09 AM   Specimen: Anterior Nasal Swab  Result Value Ref Range Status  SARS Coronavirus 2 by RT PCR NEGATIVE NEGATIVE Final   Influenza A by PCR NEGATIVE NEGATIVE Final   Influenza B by PCR NEGATIVE NEGATIVE Final    Comment: (NOTE) The Xpert Xpress SARS-CoV-2/FLU/RSV plus assay is intended as an aid in the diagnosis of influenza from Nasopharyngeal swab specimens and should not be used as a sole basis for treatment. Nasal washings and aspirates are unacceptable for Xpert Xpress SARS-CoV-2/FLU/RSV testing.  Fact Sheet for  Patients: BloggerCourse.com  Fact Sheet for Healthcare Providers: SeriousBroker.it  This test is not yet approved or cleared by the [place]  FDA and has been authorized for detection and/or diagnosis of SARS-CoV-2 by FDA under an Emergency Use Authorization (EUA). This EUA will remain in effect (meaning this test can be used) for the duration of the COVID-19 declaration under Section 564(b)(1) of the Act, 21 U.S.C. section 360bbb-3(b)(1), unless the authorization is terminated or revoked.     Resp Syncytial Virus by PCR NEGATIVE NEGATIVE Final    Comment: (NOTE) Fact Sheet for Patients: BloggerCourse.com  Fact Sheet for Healthcare Providers: SeriousBroker.it  This test is not yet approved or cleared by the [place]  FDA and has been authorized for detection and/or diagnosis of SARS-CoV-2 by FDA under an Emergency Use Authorization (EUA). This EUA will remain in effect (meaning this test can be used) for the duration of the COVID-19 declaration under Section 564(b)(1) of the Act, 21 U.S.C. section 360bbb-3(b)(1), unless the authorization is terminated or revoked.  Performed at [facility], [address], [place], [place] [address]   Culture, blood (Routine X 2) w Reflex to ID Panel     Status: None (Preliminary result)   Collection Time: 06/26/23  2:28 PM   Specimen: BLOOD RIGHT ARM  Result Value Ref Range Status   Specimen Description BLOOD RIGHT ARM  Final   Special Requests   Final    BOTTLES DRAWN AEROBIC AND ANAEROBIC Blood Culture adequate volume   Culture   Final    NO GROWTH 4 DAYS Performed at [facility], [address], [place], [place] [address]    Report Status PENDING  Incomplete  Culture, blood (Routine X 2) w Reflex to ID Panel     Status: None (Preliminary result)   Collection Time: 06/26/23  2:30 PM   Specimen: BLOOD RIGHT HAND   Result Value Ref Range Status   Specimen Description BLOOD RIGHT HAND  Final   Special Requests   Final    BOTTLES DRAWN AEROBIC AND ANAEROBIC Blood Culture adequate volume   Culture   Final    NO GROWTH 4 DAYS Performed at [facility], [address] [place], [place] [address]    Report Status PENDING  Incomplete     Studies: No results found.     Rosena Conradi, MD  [facility] 06/30/2023  If 7PM-7AM, please contact night-coverage

## 2023-07-01 DIAGNOSIS — A499 Bacterial infection, unspecified: Secondary | ICD-10-CM | POA: Diagnosis not present

## 2023-07-01 DIAGNOSIS — Z1612 Extended spectrum beta lactamase (ESBL) resistance: Secondary | ICD-10-CM | POA: Diagnosis not present

## 2023-07-01 LAB — CBC
HCT: 30.1 % — ABNORMAL LOW (ref 39.0–52.0)
Hemoglobin: 9.7 g/dL — ABNORMAL LOW (ref 13.0–17.0)
MCH: 27 pg (ref 26.0–34.0)
MCHC: 32.2 g/dL (ref 30.0–36.0)
MCV: 83.8 fL (ref 80.0–100.0)
Platelets: 257 10*3/uL (ref 150–400)
RBC: 3.59 MIL/uL — ABNORMAL LOW (ref 4.22–5.81)
RDW: 16.5 % — ABNORMAL HIGH (ref 11.5–15.5)
WBC: 7.2 10*3/uL (ref 4.0–10.5)
nRBC: 0 % (ref 0.0–0.2)

## 2023-07-01 LAB — CULTURE, BLOOD (ROUTINE X 2)
Culture: NO GROWTH
Culture: NO GROWTH
Special Requests: ADEQUATE
Special Requests: ADEQUATE

## 2023-07-01 LAB — GLUCOSE, CAPILLARY: Glucose-Capillary: 161 mg/dL — ABNORMAL HIGH (ref 70–99)

## 2023-07-01 NOTE — Progress Notes (Signed)
 Pt completed PD tx without issue.  07/01/23 0827  Peritoneal Catheter Mid lower abdomen  No placement date or time found.   Catheter Location: Mid lower abdomen  Site Assessment Clean, Dry, Intact  Drainage Description None  Catheter status Deaccessed  Dressing Gauze/Drain sponge;Occlusive  Dressing Status Clean, Dry, Intact  Dressing Intervention Dressing reinforced  Completion  Treatment Status Complete  Initial Drain Volume 2  Average Dwell Time-Hour(s) 1  Average Dwell Time-Min(s) 30  Average Drain Time 19  Total Therapy Volume [address]  Total Therapy Time-Hour(s) 8  Total Therapy Time-Min(s) 5  Weight after Drain 219 lb 5.7 oz (99.5 kg)  Effluent Appearance Clear;Yellow  Cell Count on Daytime Exchange N/A  Hand-off documentation  Report given to (Full Name) Santa [place] RN

## 2023-07-01 NOTE — Plan of Care (Signed)

## 2023-07-01 NOTE — Progress Notes (Signed)
 DISCHARGE NOTE HOME TORAN MURCH to be discharged Home per MD order. Discussed prescriptions and follow up appointments with the patient. Prescriptions given to patient; medication list explained in detail. Patient verbalized understanding.  Skin clean, dry and intact without evidence of skin break down, no evidence of skin tears noted. IV catheter discontinued intact. Site without signs and symptoms of complications. Dressing and pressure applied. Pt denies pain at the site currently. No complaints noted.  Discharging with fistula and port as noted on LDA  An After Visit Summary (AVS) was printed and given to the patient. Patient escorted via wheelchair, and discharged home via private auto.  Tonda Francisco, RN

## 2023-07-01 NOTE — Discharge Summary (Signed)
 Physician Discharge Summary  Carlos Gill ZOX:[id number] DOB: 12/30/1940 DOA: 06/24/2023  PCP: Suan Elm, MD  Admit date: 06/24/2023 Discharge date: 07/01/2023  Admitted From: Home  Discharge disposition: Home   Recommendations for Outpatient Follow-Up:   Follow up with your primary care provider in one week.  Check CBC, BMP, magnesium  in the next visit Continue peritoneal dialysis as outpatient.   Discharge Diagnosis:   Principal Problem:   ESBL (extended spectrum beta-lactamase) producing bacteria infection Active Problems:   Sepsis (HCC)   Bacteremia  Discharge Condition: Improved.  Diet recommendation: Low sodium, heart healthy.   Wound care: None.  Code status: Full.   History of Present Illness:   Patient is an 83 year old male with past medical history significant for recent admission for UTI secondary to E. coli ESBL (05/26/2023 to 05/30/2023), end-stage renal disease on peritoneal dialysis, hypertension, hyperlipidemia, type 2 diabetes mellitus and OSA on CPAP presented to the hospital with fever and chills. Of note patient had recently completed meropenem  on 06/05/2023. Blood cultures and urine culture with ESBL E. coli. CT scan of the abdomen nonrevealing. 2D echocardiogram negative for vegetation.  Patient was seen by ID and nephrology during hospitalization.   Hospital Course:   Following conditions were addressed during hospitalization as listed below,  Relapsed urinary tract infection secondary to ESBL E. coli: Sepsis/bacteremia  History recent ESBL E. Coli UTI:   Sepsis and bacteremia has resolved at this time.  CT abdomen pelvis without contrast does have nonspecific perinephric stranding; no acute abnormality noted.  Suspect relapse UTI with possible ESBL species.  Seen by infectious disease.  At this time, patient is on IV meropenem  and ID has recommended to complete 7-day course which has been completed today.  Urine culture and blood culture  with ESBL E. coli.  Repeat blood cultures from 06/26/2023 is negative so far.  2D echocardiogram from 06/26/2023 with LV ejection fraction of 50 to 55% with grade 1 diastolic dysfunction.  No evidence of valvular vegetation noted.  Low-grade fever noted yesterday but afebrile today.  At this time patient is stable for disposition home.  No leukocytosis.   ESRD on PD:  -Nephrology on board for hemodialysis needs..   Hypokalemia: Latest potassium 3.5.   Diabetes type 2 with episode of hypoglycemia, level 2 Improved at this time.  Encourage oral nutrition.   Hypertension:   Continue hydralazine  100 mg 3 times daily, metoprolol  100/75 mg AM/PM.     Hyperlipidemia:  Continue Zetia    OSA:  Continue CPAP   BPH: - Continue tamsulosin    Chronic Diastolic Heart Failure  On peritoneal dialysis for volume management   Trace pericardial effusion  Aortic atherosclerosis  Incidental findings.   Disposition.  At this time, patient is stable for disposition home with outpatient PCP follow-up.  Medical Consultants:   Nephrology ID  Procedures:    Peritoneal dialysis Subjective:   Today, patient was seen and examined at bedside.  Denies any nausea vomiting abdominal pain fever chills or rigor.  Discharge Exam:   Vitals:   07/01/23 0840 07/01/23 0841  BP: (!) 164/65 (!) 164/65  Pulse:  69  Resp:    Temp:    SpO2:     Vitals:   07/01/23 0515 07/01/23 0816 07/01/23 0840 07/01/23 0841  BP: (!) 175/72 (!) 164/65 (!) 164/65 (!) 164/65  Pulse: 65 69  69  Resp: 17 16    Temp: 97.7 F (36.5 C) 98.7 F (37.1 C)    TempSrc:  SpO2: 100% 99%    Weight:      Height:       Body mass index is 29.89 kg/m.  General: Alert awake, not in obvious distress, elderly male, Communicative. HENT: pupils equally reacting to light,  No scleral pallor or icterus noted. Oral mucosa is moist.  Chest:  Clear breath sounds.  Diminished breath sounds bilaterally. No crackles or wheezes.  Right  chest wall port in place. CVS: S1 &S2 heard. No murmur.  Regular rate and rhythm. Abdomen: Soft, nontender, nondistended.  Bowel sounds are heard.   Extremities: No cyanosis, clubbing or edema.  Peripheral pulses are palpable.  Left upper extremity fistula in place. Psych: Alert, awake and oriented, normal mood CNS:  No cranial nerve deficits.  Power equal in all extremities.   Skin: Warm and dry.  No rashes noted.  The results of significant diagnostics from this hospitalization (including imaging, microbiology, ancillary and laboratory) are listed below for reference.     Diagnostic Studies:   CT ABDOMEN PELVIS WO CONTRAST Result Date: 06/24/2023 CLINICAL DATA:  bilateral back pain; concern for pyleo vs stone EXAM: CT ABDOMEN AND PELVIS WITHOUT CONTRAST TECHNIQUE: Multidetector CT imaging of the abdomen and pelvis was performed following the standard protocol without IV contrast. RADIATION DOSE REDUCTION: This exam was performed according to the departmental dose-optimization program which includes automated exposure control, adjustment of the mA and/or kV according to patient size and/or use of iterative reconstruction technique. COMPARISON:  CT abdomen pelvis 11/01/2021 FINDINGS: Lower chest: Trace pericardial effusion. Hepatobiliary: No focal liver abnormality. No gallstones, gallbladder wall thickening, or pericholecystic fluid. No biliary dilatation. Pancreas: No focal lesion. Normal pancreatic contour. No surrounding inflammatory changes. No main pancreatic ductal dilatation. Spleen: Normal in size without focal abnormality. Adrenals/Urinary Tract: No adrenal nodule bilaterally. Nonspecific bilateral perinephric fat stranding. No nephrolithiasis and no hydronephrosis. Fluid density lesions likely represent simple renal cysts. Simple renal cysts, in the absence of clinically indicated signs/symptoms, require no independent follow-up. No ureterolithiasis or hydroureter. The urinary bladder is  unremarkable. Stomach/Bowel: Stomach is within normal limits. No evidence of bowel wall thickening or dilatation. Appendix appears normal. Vascular/Lymphatic: No abdominal aorta or iliac aneurysm. Mild atherosclerotic plaque of the aorta and its branches. No abdominal, pelvic, or inguinal lymphadenopathy. Reproductive: Prostate is unremarkable.  Likely TURP procedure. Other: Peritoneal dialysis catheter within the pelvis. No intraperitoneal free fluid. No intraperitoneal free gas. No organized fluid collection. Musculoskeletal: Soft tissue densities along the anterior abdominal wall likely related to medication injection. No hernia. Mild subcutaneus soft tissue edema. No suspicious lytic or blastic osseous lesions. No acute displaced fracture. Coarsening of the right iliac bone trabecula stable compared to prior. IMPRESSION: 1. No acute intra-abdominal intrapelvic abnormality with limited evaluation on this noncontrast study. 2. Trace pericardial effusion. 3.  Aortic Atherosclerosis (ICD10-I70.0). Electronically Signed   By: Morgane  Naveau M.D.   On: 06/24/2023 22:43   DG Chest Portable 1 View Result Date: 06/24/2023 CLINICAL DATA:  Cough EXAM: PORTABLE CHEST 1 VIEW COMPARISON:  05/26/2023 FINDINGS: Single frontal view of the chest demonstrates a stable enlarged cardiac silhouette. No acute airspace disease, effusion, or pneumothorax. No acute bony abnormalities. IMPRESSION: 1. No acute intrathoracic process. Electronically Signed   By: Bobbye Burrow M.D.   On: 06/24/2023 22:38     Labs:   Basic Metabolic Panel: Recent Labs  Lab 06/25/23 0815 06/26/23 0735 06/26/23 1304 06/27/23 0510 06/28/23 0450 06/29/23 0623 06/30/23 0734  NA  --  136  --  139  141 138 139  K  --  2.6*   < > 2.8* 3.1* 3.2* 3.5  CL  --  106  --  110 111 109 111  CO2  --  22  --  21* 22 21* 22  GLUCOSE  --  59*  --  99 159* 204* 221*  BUN  --  54*  --  56* 53* 48* 50*  CREATININE  --  4.16*  --  4.05* 3.88* 3.87* 3.96*   CALCIUM  --  7.9*  --  8.2* 8.4* 8.3* 8.8*  MG 1.5*  --   --  1.7 2.1 2.1  --   PHOS  --  3.4  --  3.2 2.8 3.1 2.9   < > = values in this interval not displayed.   GFR Estimated Creatinine Clearance: 17.3 mL/min (A) (by C-G formula based on SCr of 3.96 mg/dL (H)). Liver Function Tests: Recent Labs  Lab 06/26/23 0735 06/27/23 0510 06/28/23 0450 06/29/23 0623 06/30/23 0734  ALBUMIN 2.4* 2.4* 2.3* 2.4* 2.5*   No results for input(s): "LIPASE", "AMYLASE" in the last 168 hours. No results for input(s): "AMMONIA" in the last 168 hours. Coagulation profile No results for input(s): "INR", "PROTIME" in the last 168 hours.  CBC: Recent Labs  Lab 06/24/23 1818 06/25/23 0815 06/29/23 0623 07/01/23 0829  WBC 7.1 8.4 6.3 7.2  NEUTROABS 5.5 6.9  --   --   HGB 10.9* 9.8* 9.3* 9.7*  HCT 33.8* 30.0* 28.7* 30.1*  MCV 83.5 83.3 83.2 83.8  PLT 161 134* 189 257   Cardiac Enzymes: No results for input(s): "CKTOTAL", "CKMB", "CKMBINDEX", "TROPONINI" in the last 168 hours. BNP: Invalid input(s): "POCBNP" CBG: Recent Labs  Lab 06/30/23 0843 06/30/23 1219 06/30/23 1755 06/30/23 1921 07/01/23 0818  GLUCAP 195* 191* 230* 203* 161*   D-Dimer No results for input(s): "DDIMER" in the last 72 hours. Hgb A1c No results for input(s): "HGBA1C" in the last 72 hours. Lipid Profile No results for input(s): "CHOL", "HDL", "LDLCALC", "TRIG", "CHOLHDL", "LDLDIRECT" in the last 72 hours. Thyroid function studies No results for input(s): "TSH", "T4TOTAL", "T3FREE", "THYROIDAB" in the last 72 hours.  Invalid input(s): "FREET3" Anemia work up No results for input(s): "VITAMINB12", "FOLATE", "FERRITIN", "TIBC", "IRON", "RETICCTPCT" in the last 72 hours. Microbiology Recent Results (from the past 240 hours)  Blood culture (routine x 2)     Status: Abnormal   Collection Time: 06/24/23 10:45 PM   Specimen: BLOOD  Result Value Ref Range Status   Specimen Description BLOOD RIGHT ANTECUBITAL  Final    Special Requests   Final    BOTTLES DRAWN AEROBIC AND ANAEROBIC Blood Culture adequate volume   Culture  Setup Time   Final    GRAM NEGATIVE RODS AEROBIC BOTTLE ONLY CRITICAL VALUE NOTED.  VALUE IS CONSISTENT WITH PREVIOUSLY REPORTED AND CALLED VALUE.    Culture (A)  Final    ESCHERICHIA COLI SUSCEPTIBILITIES PERFORMED ON PREVIOUS CULTURE WITHIN THE LAST 5 DAYS. Performed at [facility], [address] [place], [place] [address]    Report Status 06/28/2023 FINAL  Final  Blood culture (routine x 2)     Status: Abnormal   Collection Time: 06/24/23 10:58 PM   Specimen: BLOOD RIGHT HAND  Result Value Ref Range Status   Specimen Description BLOOD RIGHT HAND  Final   Special Requests   Final    BOTTLES DRAWN AEROBIC AND ANAEROBIC Blood Culture adequate volume   Culture  Setup Time   Final  GRAM NEGATIVE RODS AEROBIC BOTTLE ONLY CRITICAL RESULT CALLED TO, READ BACK BY AND VERIFIED WITH: Pharmd Nathan G on [id number] @1422  by SM Performed at [facility], [address], [place], [place] [address]    Culture (A)  Final    ESCHERICHIA COLI Confirmed Extended Spectrum Beta-Lactamase Producer (ESBL).  In bloodstream infections from ESBL organisms, carbapenems are preferred over piperacillin/tazobactam. They are shown to have a lower risk of mortality.    Report Status 06/27/2023 FINAL  Final   Organism ID, Bacteria ESCHERICHIA COLI  Final      Susceptibility   Escherichia coli - MIC*    AMPICILLIN >=32 RESISTANT Resistant     CEFEPIME 16 RESISTANT Resistant     CEFTAZIDIME RESISTANT Resistant     CEFTRIAXONE  >=64 RESISTANT Resistant     CIPROFLOXACIN  1 RESISTANT Resistant     GENTAMICIN  >=16 RESISTANT Resistant     IMIPENEM <=0.25 SENSITIVE Sensitive     TRIMETH/SULFA >=320 RESISTANT Resistant     AMPICILLIN/SULBACTAM >=32 RESISTANT Resistant     PIP/TAZO 8 SENSITIVE Sensitive ug/mL    * ESCHERICHIA COLI  Urine Culture     Status: Abnormal   Collection Time: 06/24/23  10:58 PM   Specimen: Urine, Random  Result Value Ref Range Status   Specimen Description URINE, RANDOM  Final   Special Requests   Final    NONE Reflexed from [id number] Performed at [facility], [address], [place], [place] [address]    Culture (A)  Final    >=100,000 COLONIES/mL ESCHERICHIA COLI Confirmed Extended Spectrum Beta-Lactamase Producer (ESBL).  In bloodstream infections from ESBL organisms, carbapenems are preferred over piperacillin/tazobactam. They are shown to have a lower risk of mortality.    Report Status 06/27/2023 FINAL  Final   Organism ID, Bacteria ESCHERICHIA COLI (A)  Final      Susceptibility   Escherichia coli - MIC*    AMPICILLIN >=32 RESISTANT Resistant     CEFAZOLIN  >=64 RESISTANT Resistant     CEFEPIME 16 RESISTANT Resistant     CEFTRIAXONE  >=64 RESISTANT Resistant     CIPROFLOXACIN  1 RESISTANT Resistant     GENTAMICIN  >=16 RESISTANT Resistant     IMIPENEM <=0.25 SENSITIVE Sensitive     NITROFURANTOIN 64 INTERMEDIATE Intermediate     TRIMETH/SULFA >=320 RESISTANT Resistant     AMPICILLIN/SULBACTAM >=32 RESISTANT Resistant     PIP/TAZO 8 SENSITIVE Sensitive ug/mL    * >=100,000 COLONIES/mL ESCHERICHIA COLI  Blood Culture ID Panel (Reflexed)     Status: Abnormal   Collection Time: 06/24/23 10:58 PM  Result Value Ref Range Status   Enterococcus faecalis NOT DETECTED NOT DETECTED Final   Enterococcus Faecium NOT DETECTED NOT DETECTED Final   Listeria monocytogenes NOT DETECTED NOT DETECTED Final   Staphylococcus species NOT DETECTED NOT DETECTED Final   Staphylococcus aureus (BCID) NOT DETECTED NOT DETECTED Final   Staphylococcus epidermidis NOT DETECTED NOT DETECTED Final   Staphylococcus lugdunensis NOT DETECTED NOT DETECTED Final   Streptococcus species NOT DETECTED NOT DETECTED Final   Streptococcus agalactiae NOT DETECTED NOT DETECTED Final   Streptococcus pneumoniae NOT DETECTED NOT DETECTED Final   Streptococcus pyogenes NOT DETECTED  NOT DETECTED Final   A.calcoaceticus-baumannii NOT DETECTED NOT DETECTED Final   Bacteroides fragilis NOT DETECTED NOT DETECTED Final   Enterobacterales DETECTED (A) NOT DETECTED Final    Comment: Enterobacterales represent a large order of gram negative bacteria, not a single organism. CRITICAL RESULT CALLED TO, READ BACK BY AND  VERIFIED WITH: Pharmd Nathan G on [phone] @1422  by SM    Enterobacter cloacae complex NOT DETECTED NOT DETECTED Final   Escherichia coli DETECTED (A) NOT DETECTED Final    Comment: CRITICAL RESULT CALLED TO, READ BACK BY AND VERIFIED WITH: Pharmd Nathan G on [id number] @1422  by SM    Klebsiella aerogenes NOT DETECTED NOT DETECTED Final   Klebsiella oxytoca NOT DETECTED NOT DETECTED Final   Klebsiella pneumoniae NOT DETECTED NOT DETECTED Final   Proteus species NOT DETECTED NOT DETECTED Final   Salmonella species NOT DETECTED NOT DETECTED Final   Serratia marcescens NOT DETECTED NOT DETECTED Final   Haemophilus influenzae NOT DETECTED NOT DETECTED Final   Neisseria meningitidis NOT DETECTED NOT DETECTED Final   Pseudomonas aeruginosa NOT DETECTED NOT DETECTED Final   Stenotrophomonas maltophilia NOT DETECTED NOT DETECTED Final   Candida albicans NOT DETECTED NOT DETECTED Final   Candida auris NOT DETECTED NOT DETECTED Final   Candida glabrata NOT DETECTED NOT DETECTED Final   Candida krusei NOT DETECTED NOT DETECTED Final   Candida parapsilosis NOT DETECTED NOT DETECTED Final   Candida tropicalis NOT DETECTED NOT DETECTED Final   Cryptococcus neoformans/gattii NOT DETECTED NOT DETECTED Final   CTX-M ESBL DETECTED (A) NOT DETECTED Final    Comment: CRITICAL RESULT CALLED TO, READ BACK BY AND VERIFIED WITH: Pharmd Nathan G on [phone] @1422  by SM (NOTE) Extended spectrum beta-lactamase detected. Recommend a carbapenem as initial therapy.      Carbapenem resistance IMP NOT DETECTED NOT DETECTED Final   Carbapenem resistance KPC NOT DETECTED NOT DETECTED Final    Carbapenem resistance NDM NOT DETECTED NOT DETECTED Final   Carbapenem resist OXA 48 LIKE NOT DETECTED NOT DETECTED Final   Carbapenem resistance VIM NOT DETECTED NOT DETECTED Final    Comment: Performed at [facility], [address], [place], [place] [address]  Body fluid culture w Gram Stain     Status: None   Collection Time: 06/24/23 11:48 PM   Specimen: Peritoneal Dialysate; Body Fluid  Result Value Ref Range Status   Specimen Description PERITONEAL DIALYSATE  Final   Special Requests NONE  Final   Gram Stain NO WBC SEEN NO ORGANISMS SEEN   Final   Culture   Final    NO GROWTH 3 DAYS Performed at [facility], [address], [place], [place] [address]    Report Status 06/28/2023 FINAL  Final  Resp panel by RT-PCR (RSV, Flu A&B, Covid) Anterior Nasal Swab     Status: None   Collection Time: 06/25/23 12:09 AM   Specimen: Anterior Nasal Swab  Result Value Ref Range Status   SARS Coronavirus 2 by RT PCR NEGATIVE NEGATIVE Final   Influenza A by PCR NEGATIVE NEGATIVE Final   Influenza B by PCR NEGATIVE NEGATIVE Final    Comment: (NOTE) The Xpert Xpress SARS-CoV-2/FLU/RSV plus assay is intended as an aid in the diagnosis of influenza from Nasopharyngeal swab specimens and should not be used as a sole basis for treatment. Nasal washings and aspirates are unacceptable for Xpert Xpress SARS-CoV-2/FLU/RSV testing.  Fact Sheet for Patients: BloggerCourse.com  Fact Sheet for Healthcare Providers: SeriousBroker.it  This test is not yet approved or cleared by the [place]  FDA and has been authorized for detection and/or diagnosis of SARS-CoV-2 by FDA under an Emergency Use Authorization (EUA). This EUA will remain in effect (meaning this test can be used) for the duration of the COVID-19 declaration under Section 564(b)(1) of the Act,  21 U.S.C. section 360bbb-3(b)(1), unless the authorization is terminated  or revoked.     Resp Syncytial Virus by PCR NEGATIVE NEGATIVE Final    Comment: (NOTE) Fact Sheet for Patients: BloggerCourse.com  Fact Sheet for Healthcare Providers: SeriousBroker.it  This test is not yet approved or cleared by the [place]  FDA and has been authorized for detection and/or diagnosis of SARS-CoV-2 by FDA under an Emergency Use Authorization (EUA). This EUA will remain in effect (meaning this test can be used) for the duration of the COVID-19 declaration under Section 564(b)(1) of the Act, 21 U.S.C. section 360bbb-3(b)(1), unless the authorization is terminated or revoked.  Performed at [facility], [address] [place], [place] [address]   Culture, blood (Routine X 2) w Reflex to ID Panel     Status: None   Collection Time: 06/26/23  2:28 PM   Specimen: BLOOD RIGHT ARM  Result Value Ref Range Status   Specimen Description BLOOD RIGHT ARM  Final   Special Requests   Final    BOTTLES DRAWN AEROBIC AND ANAEROBIC Blood Culture adequate volume   Culture   Final    NO GROWTH 5 DAYS Performed at [facility], [address] [place], [place] [address]    Report Status 07/01/2023 FINAL  Final  Culture, blood (Routine X 2) w Reflex to ID Panel     Status: None   Collection Time: 06/26/23  2:30 PM   Specimen: BLOOD RIGHT HAND  Result Value Ref Range Status   Specimen Description BLOOD RIGHT HAND  Final   Special Requests   Final    BOTTLES DRAWN AEROBIC AND ANAEROBIC Blood Culture adequate volume   Culture   Final    NO GROWTH 5 DAYS Performed at [facility], [address] [place], [place] [address]    Report Status 07/01/2023 FINAL  Final     Discharge Instructions:   Discharge Instructions     Call MD for:  persistant nausea and vomiting   Complete by: As directed    Call MD for:  severe uncontrolled pain   Complete by: As directed    Call MD for:  temperature >100.4    Complete by: As directed    Diet - low sodium heart healthy   Complete by: As directed    Discharge instructions   Complete by: As directed    Follow-up with your primary care provider in 1 week.  Seek medical attention for worsening symptoms.   Increase activity slowly   Complete by: As directed    No wound care   Complete by: As directed       Allergies as of 07/01/2023       Reactions   Statins Swelling, Other (See Comments)   Muscle aches   Wound Dressing Adhesive Dermatitis   Pt has rash on L arm from tape "used with stitches"        Medication List     TAKE these medications    acetaminophen  500 MG tablet Commonly known as: TYLENOL  Take 1,000 mg by mouth daily as needed for moderate pain.   aspirin  EC 81 MG tablet Take 81 mg by mouth every morning.   calcitRIOL  0.5 MCG capsule Commonly known as: ROCALTROL  Take 0.5 mcg by mouth 2 (two) times daily.   epoetin alfa 4000 UNIT/ML injection Commonly known as: EPOGEN Inject 4,000 Units into the vein once a week.   ezetimibe  10 MG tablet Commonly known as: ZETIA   Take 10 mg by mouth daily.   FreeStyle Libre 2 Sensor Misc Inject 1 Device into the skin every 14 (fourteen) days.   furosemide  40 MG tablet Commonly known as: LASIX  Take 40 mg by mouth 2 (two) times daily.   gabapentin  100 MG capsule Commonly known as: NEURONTIN  Take 100-200 mg by mouth 2 (two) times daily. Take 100 mg in the morning and 200 mg at bedtime   hydrALAZINE  100 MG tablet Commonly known as: APRESOLINE  Take 100 mg by mouth 2 (two) times daily.   insulin  aspart 100 UNIT/ML injection Commonly known as: novoLOG  Inject 6-18 Units into the skin 3 (three) times daily with meals. Per sliding scale, 1 UNIT FOR EVERY 10 GRAMS CARBOHYDRATES, CORRECTION   insulin  glargine 100 unit/mL Sopn Commonly known as: LANTUS  Inject 40 Units into the skin 2 (two) times daily.   metoprolol  tartrate 50 MG tablet Commonly known as: LOPRESSOR  Take 75-100  mg by mouth 2 (two) times daily. Take 100 mg in the morning and 75 mg at bedtime   NIFEdipine  60 MG 24 hr tablet Commonly known as: ADALAT  CC Take 120 mg by mouth daily.   omeprazole 20 MG capsule Commonly known as: PRILOSEC Take 20 mg by mouth daily.   potassium chloride  SA 20 MEQ tablet Commonly known as: KLOR-CON  M Take 20 mEq by mouth daily.   PreserVision AREDS 2 Caps Take 1 capsule by mouth 2 (two) times daily.   tamsulosin  0.4 MG Caps capsule Commonly known as: FLOMAX  Take 0.4 mg by mouth at bedtime.        Follow-up Information     Suan Elm, MD Follow up in 1 week(s).   Specialty: Internal Medicine Contact information: [address] [place] [address] [phone]                  Time coordinating discharge: 39 minutes  Signed:  Ardene Remley  [facility] 07/01/2023, 10:22 AM

## 2023-07-01 NOTE — Progress Notes (Signed)
 Transition of Care [facility]) - Inpatient Brief Assessment   Patient Details  Name: Carlos Gill MRN: [id number] Date of Birth: [birth year]  [facility] of [facility] CM/SW Contact:    Dane Dung, RN Phone Number: 07/01/2023, 11:15 AM   Clinical Narrative: Patient admitted from home for Septicemia.  Patient should discharge to home with family - no TOC needs per attending MD.   Transition of Care Asessment: Insurance and Status: (P) Insurance coverage has been reviewed Patient has primary care physician: (P) Yes Home environment has been reviewed: (P) from home Prior level of function:: (P) Independent Prior/Current Home Services: (P) No current home services Social Drivers of Health Review: (P) SDOH reviewed interventions complete Readmission risk has been reviewed: (P) Yes Transition of care needs: (P) no transition of care needs at this time

## 2023-07-01 NOTE — Progress Notes (Signed)
 Pt d/c earlier today. Contacted [facility] to advise staff of pt's d/c today. D/C summary and yesterday's renal note faxed to clinic for continuation of care.   Lauraine Polite Renal Navigator [phone]

## 2023-08-04 ENCOUNTER — Other Ambulatory Visit: Payer: Self-pay

## 2023-08-04 ENCOUNTER — Emergency Department (HOSPITAL_COMMUNITY)

## 2023-08-04 ENCOUNTER — Encounter (HOSPITAL_COMMUNITY): Payer: Self-pay

## 2023-08-04 ENCOUNTER — Inpatient Hospital Stay (HOSPITAL_COMMUNITY)
Admission: EM | Admit: 2023-08-04 | Discharge: 2023-08-08 | DRG: 291 | Disposition: A | Discharge status: Home / Self Care | Attending: Internal Medicine | Admitting: Internal Medicine

## 2023-08-04 DIAGNOSIS — R42 Dizziness and giddiness: Secondary | ICD-10-CM | POA: Diagnosis not present

## 2023-08-04 DIAGNOSIS — E785 Hyperlipidemia, unspecified: Secondary | ICD-10-CM | POA: Diagnosis present

## 2023-08-04 DIAGNOSIS — I447 Left bundle-branch block, unspecified: Secondary | ICD-10-CM | POA: Diagnosis present

## 2023-08-04 DIAGNOSIS — Z7982 Long term (current) use of aspirin: Secondary | ICD-10-CM

## 2023-08-04 DIAGNOSIS — I493 Ventricular premature depolarization: Secondary | ICD-10-CM | POA: Diagnosis present

## 2023-08-04 DIAGNOSIS — Z992 Dependence on renal dialysis: Secondary | ICD-10-CM

## 2023-08-04 DIAGNOSIS — E1165 Type 2 diabetes mellitus with hyperglycemia: Secondary | ICD-10-CM | POA: Diagnosis present

## 2023-08-04 DIAGNOSIS — E1122 Type 2 diabetes mellitus with diabetic chronic kidney disease: Secondary | ICD-10-CM | POA: Diagnosis present

## 2023-08-04 DIAGNOSIS — Z961 Presence of intraocular lens: Secondary | ICD-10-CM | POA: Diagnosis present

## 2023-08-04 DIAGNOSIS — E11649 Type 2 diabetes mellitus with hypoglycemia without coma: Secondary | ICD-10-CM | POA: Diagnosis not present

## 2023-08-04 DIAGNOSIS — E1142 Type 2 diabetes mellitus with diabetic polyneuropathy: Secondary | ICD-10-CM | POA: Diagnosis present

## 2023-08-04 DIAGNOSIS — E669 Obesity, unspecified: Secondary | ICD-10-CM | POA: Diagnosis present

## 2023-08-04 DIAGNOSIS — Z6828 Body mass index (BMI) 28.0-28.9, adult: Secondary | ICD-10-CM

## 2023-08-04 DIAGNOSIS — I5032 Chronic diastolic (congestive) heart failure: Secondary | ICD-10-CM | POA: Diagnosis present

## 2023-08-04 DIAGNOSIS — T501X5A Adverse effect of loop [high-ceiling] diuretics, initial encounter: Secondary | ICD-10-CM | POA: Diagnosis present

## 2023-08-04 DIAGNOSIS — Z833 Family history of diabetes mellitus: Secondary | ICD-10-CM

## 2023-08-04 DIAGNOSIS — E876 Hypokalemia: Secondary | ICD-10-CM | POA: Diagnosis present

## 2023-08-04 DIAGNOSIS — B9620 Unspecified Escherichia coli [E. coli] as the cause of diseases classified elsewhere: Secondary | ICD-10-CM | POA: Diagnosis present

## 2023-08-04 DIAGNOSIS — N400 Enlarged prostate without lower urinary tract symptoms: Secondary | ICD-10-CM | POA: Insufficient documentation

## 2023-08-04 DIAGNOSIS — I132 Hypertensive heart and chronic kidney disease with heart failure and with stage 5 chronic kidney disease, or end stage renal disease: Secondary | ICD-10-CM | POA: Diagnosis not present

## 2023-08-04 DIAGNOSIS — N401 Enlarged prostate with lower urinary tract symptoms: Secondary | ICD-10-CM | POA: Diagnosis present

## 2023-08-04 DIAGNOSIS — Z888 Allergy status to other drugs, medicaments and biological substances status: Secondary | ICD-10-CM

## 2023-08-04 DIAGNOSIS — Z9842 Cataract extraction status, left eye: Secondary | ICD-10-CM

## 2023-08-04 DIAGNOSIS — K219 Gastro-esophageal reflux disease without esophagitis: Secondary | ICD-10-CM | POA: Diagnosis present

## 2023-08-04 DIAGNOSIS — Z79899 Other long term (current) drug therapy: Secondary | ICD-10-CM

## 2023-08-04 DIAGNOSIS — N186 End stage renal disease: Secondary | ICD-10-CM | POA: Diagnosis present

## 2023-08-04 DIAGNOSIS — G4730 Sleep apnea, unspecified: Secondary | ICD-10-CM | POA: Diagnosis present

## 2023-08-04 DIAGNOSIS — N390 Urinary tract infection, site not specified: Secondary | ICD-10-CM

## 2023-08-04 DIAGNOSIS — D631 Anemia in chronic kidney disease: Secondary | ICD-10-CM | POA: Diagnosis present

## 2023-08-04 DIAGNOSIS — Z9841 Cataract extraction status, right eye: Secondary | ICD-10-CM

## 2023-08-04 DIAGNOSIS — H3530 Unspecified macular degeneration: Secondary | ICD-10-CM | POA: Diagnosis present

## 2023-08-04 DIAGNOSIS — Z87891 Personal history of nicotine dependence: Secondary | ICD-10-CM

## 2023-08-04 DIAGNOSIS — I10 Essential (primary) hypertension: Secondary | ICD-10-CM | POA: Diagnosis present

## 2023-08-04 DIAGNOSIS — J9811 Atelectasis: Secondary | ICD-10-CM | POA: Diagnosis present

## 2023-08-04 DIAGNOSIS — R079 Chest pain, unspecified: Secondary | ICD-10-CM | POA: Diagnosis present

## 2023-08-04 DIAGNOSIS — Z794 Long term (current) use of insulin: Secondary | ICD-10-CM

## 2023-08-04 DIAGNOSIS — N2581 Secondary hyperparathyroidism of renal origin: Secondary | ICD-10-CM | POA: Diagnosis present

## 2023-08-04 LAB — URINALYSIS, ROUTINE W REFLEX MICROSCOPIC
Bilirubin Urine: NEGATIVE
Glucose, UA: NEGATIVE mg/dL
Ketones, ur: NEGATIVE mg/dL
Nitrite: NEGATIVE
Protein, ur: 100 mg/dL — AB
Specific Gravity, Urine: 1.005 (ref 1.005–1.030)
pH: 6 (ref 5.0–8.0)

## 2023-08-04 LAB — TROPONIN I (HIGH SENSITIVITY)
Troponin I (High Sensitivity): 15 ng/L (ref <18)
Troponin I (High Sensitivity): 29 ng/L — ABNORMAL HIGH (ref <18)
Troponin I (High Sensitivity): 26 ng/L — ABNORMAL HIGH (ref <18)

## 2023-08-04 LAB — CBC WITH DIFFERENTIAL/PLATELET
Abs Immature Granulocytes: 0.01 10*3/uL (ref 0.00–0.07)
Basophils Absolute: 0 10*3/uL (ref 0.0–0.1)
Basophils Relative: 0 %
Eosinophils Absolute: 0.2 10*3/uL (ref 0.0–0.5)
Eosinophils Relative: 3 %
HCT: 34.2 % — ABNORMAL LOW (ref 39.0–52.0)
Hemoglobin: 11.3 g/dL — ABNORMAL LOW (ref 13.0–17.0)
Immature Granulocytes: 0 %
Lymphocytes Relative: 31 %
Lymphs Abs: 1.5 10*3/uL (ref 0.7–4.0)
MCH: 28.3 pg (ref 26.0–34.0)
MCHC: 33 g/dL (ref 30.0–36.0)
MCV: 85.7 fL (ref 80.0–100.0)
Monocytes Absolute: 0.5 10*3/uL (ref 0.1–1.0)
Monocytes Relative: 11 %
Neutro Abs: 2.6 10*3/uL (ref 1.7–7.7)
Neutrophils Relative %: 55 %
Platelets: 243 10*3/uL (ref 150–400)
RBC: 3.99 MIL/uL — ABNORMAL LOW (ref 4.22–5.81)
RDW: 16.5 % — ABNORMAL HIGH (ref 11.5–15.5)
WBC: 4.7 10*3/uL (ref 4.0–10.5)
nRBC: 0 % (ref 0.0–0.2)

## 2023-08-04 LAB — BASIC METABOLIC PANEL WITH GFR
Anion gap: 14 (ref 5–15)
BUN: 55 mg/dL — ABNORMAL HIGH (ref 8–23)
CO2: 20 mmol/L — ABNORMAL LOW (ref 22–32)
Calcium: 8.4 mg/dL — ABNORMAL LOW (ref 8.9–10.3)
Chloride: 103 mmol/L (ref 98–111)
Creatinine, Ser: 4.73 mg/dL — ABNORMAL HIGH (ref 0.61–1.24)
GFR, Estimated: 12 mL/min — ABNORMAL LOW (ref >60)
Glucose, Bld: 188 mg/dL — ABNORMAL HIGH (ref 70–99)
Potassium: 2.9 mmol/L — ABNORMAL LOW (ref 3.5–5.1)
Sodium: 137 mmol/L (ref 135–145)

## 2023-08-04 LAB — CBG MONITORING, ED: Glucose-Capillary: 187 mg/dL — ABNORMAL HIGH (ref 70–99)

## 2023-08-04 MED ORDER — CEPHALEXIN 250 MG PO CAPS
250.0000 mg | ORAL_CAPSULE | Freq: Once | ORAL | Status: DC
  Filled 2023-08-04 (×2): qty 1

## 2023-08-04 MED ORDER — CEPHALEXIN 250 MG PO CAPS: 250.0000 mg | ORAL_CAPSULE | Freq: Two times a day (BID) | ORAL | 0 refills | Status: DC

## 2023-08-04 MED ORDER — POTASSIUM CHLORIDE CRYS ER 20 MEQ PO TBCR
40.0000 meq | EXTENDED_RELEASE_TABLET | Freq: Once | ORAL | Status: AC
  Administered 2023-08-04: 40 meq via ORAL
  Filled 2023-08-04: qty 2

## 2023-08-04 NOTE — ED Triage Notes (Signed)
 Pt reports he was shopping at [employer] and bent over to pick up and item and the room started spinning and wouldn't stop.  Pt reports it is making him very nervous and pt appears to be hyperventilating during triage.

## 2023-08-04 NOTE — ED Notes (Signed)
 ED Provider at bedside.

## 2023-08-04 NOTE — ED Notes (Signed)
 Called AC to bring pt's medication from the main pharmacy

## 2023-08-04 NOTE — ED Notes (Signed)
 Pt denies any CP at this time. Pt states feels the same as before. Pt shows no signs of distress. Pt is slightly diaphoretic. EKG captured d/t EKG changes. EDP made aware

## 2023-08-04 NOTE — ED Provider Notes (Signed)
 [place] EMERGENCY DEPARTMENT AT [facility] Provider Note   CSN: [id number] Arrival date & time: 08/04/23  1704     Patient presents with: Dizziness   Carlos Gill is a 83 y.o. male.  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  He said he was at [facility] when he acutely felt dizzy lightheaded along with sweats and short of breath.  EMS was called and transported him here.  He said he feels a little bit better but it has not completely resolved.  Comes and goes now.  He said he has never had this before.  No recent illness.  Last had peritoneal dialysis last night.  No difficulties with back.  No chest pain abdominal pain vomiting diarrhea or urinary symptoms.   The history is provided by the patient.  Dizziness Quality:  Lightheadedness Severity:  Severe Onset quality:  Sudden Timing:  Constant Progression:  Partially resolved Chronicity:  New Context: bending over   Relieved by:  None tried Worsened by:  Nothing Ineffective treatments:  None tried Associated symptoms: nausea and shortness of breath   Associated symptoms: no chest pain, no headaches, no syncope, no vision changes and no vomiting        Prior to Admission medications   Medication Sig Start Date End Date Taking? Authorizing Provider  acetaminophen  (TYLENOL ) 500 MG tablet Take 1,000 mg by mouth daily as needed for moderate pain.    [provider]  aspirin  EC 81 MG tablet Take 81 mg by mouth every morning.    [provider]  calcitRIOL  (ROCALTROL ) 0.5 MCG capsule Take 0.5 mcg by mouth 2 (two) times daily. 02/13/21   [provider]  Continuous Glucose Sensor (FREESTYLE LIBRE 2 SENSOR) MISC Inject 1 Device into the skin every 14 (fourteen) days. 06/24/21   [provider]  epoetin alfa (EPOGEN) 4000 UNIT/ML injection Inject 4,000 Units into the vein once a week.    [provider]  ezetimibe  (ZETIA ) 10 MG tablet  Take 10 mg by mouth daily.    [provider]  furosemide  (LASIX ) 40 MG tablet Take 40 mg by mouth 2 (two) times daily. 01/19/21   [provider]  gabapentin  (NEURONTIN ) 100 MG capsule Take 100-200 mg by mouth 2 (two) times daily. Take 100 mg in the morning and 200 mg at bedtime 02/08/19   [provider]  hydrALAZINE  (APRESOLINE ) 100 MG tablet Take 100 mg by mouth 2 (two) times daily. 08/19/22   [provider]  insulin  aspart (NOVOLOG ) 100 UNIT/ML injection Inject 6-18 Units into the skin 3 (three) times daily with meals. Per sliding scale, 1 UNIT FOR EVERY 10 GRAMS CARBOHYDRATES, CORRECTION    [provider]  insulin  glargine (LANTUS ) 100 units/mL SOLN Inject 40 Units into the skin 2 (two) times daily.    [provider]  metoprolol  tartrate (LOPRESSOR ) 50 MG tablet Take 75-100 mg by mouth 2 (two) times daily. Take 100 mg in the morning and 75 mg at bedtime    [provider]  Multiple Vitamins-Minerals (PRESERVISION AREDS 2) CAPS Take 1 capsule by mouth 2 (two) times daily.    [provider]  NIFEdipine  (ADALAT  CC) 60 MG 24 hr tablet Take 120 mg by mouth daily.    [provider]  omeprazole (PRILOSEC) 20 MG capsule Take 20 mg by mouth daily.    [provider]  potassium chloride  SA (KLOR-CON  M) 20 MEQ tablet  Take 20 mEq by mouth daily. Patient not taking: Reported on 06/26/2023 06/24/23   [provider]  tamsulosin  (FLOMAX ) 0.4 MG CAPS capsule Take 0.4 mg by mouth at bedtime.    [provider]    Allergies: Statins and Wound dressing adhesive    Review of Systems  Constitutional:  Positive for diaphoresis and fatigue. Negative for fever.  Eyes:  Negative for visual disturbance.  Respiratory:  Positive for shortness of breath.   Cardiovascular:  Negative for chest pain and syncope.  Gastrointestinal:  Positive for nausea. Negative for abdominal pain and vomiting.  Neurological:   Positive for dizziness and light-headedness. Negative for headaches.    Updated Vital Signs BP (!) 153/90 (BP Location: Right Arm)   Pulse 87   Temp (!) 97.5 F (36.4 C) (Oral)   Resp 20   Wt 97.5 kg   SpO2 100%   BMI 29.16 kg/m   Physical Exam Vitals and nursing note reviewed.  Constitutional:      General: He is not in acute distress.    Appearance: Normal appearance. He is well-developed.  HENT:     Head: Normocephalic and atraumatic.  Eyes:     Conjunctiva/sclera: Conjunctivae normal.  Cardiovascular:     Rate and Rhythm: Normal rate and regular rhythm.     Heart sounds: No murmur heard. Pulmonary:     Effort: Pulmonary effort is normal. No respiratory distress.     Breath sounds: Normal breath sounds.  Abdominal:     Palpations: Abdomen is soft.     Tenderness: There is no abdominal tenderness. There is no guarding or rebound.  Musculoskeletal:     Cervical back: Neck supple.     Right lower leg: Edema present.     Left lower leg: Edema present.  Skin:    General: Skin is warm and dry.     Capillary Refill: Capillary refill takes less than 2 seconds.  Neurological:     General: No focal deficit present.     Mental Status: He is alert and oriented to person, place, and time.     Cranial Nerves: No cranial nerve deficit.     Sensory: No sensory deficit.     Motor: No weakness.     (all labs ordered are listed, but only abnormal results are displayed) Labs Reviewed  BASIC METABOLIC PANEL WITH GFR - Abnormal; Notable for the following components:      Result Value   Potassium 2.9 (*)    CO2 20 (*)    Glucose, Bld 188 (*)    BUN 55 (*)    Creatinine, Ser 4.73 (*)    Calcium 8.4 (*)    GFR, Estimated 12 (*)    All other components within normal limits  CBC WITH DIFFERENTIAL/PLATELET - Abnormal; Notable for the following components:   RBC 3.99 (*)    Hemoglobin 11.3 (*)    HCT 34.2 (*)    RDW 16.5 (*)    All other components within normal limits   URINALYSIS, ROUTINE W REFLEX MICROSCOPIC - Abnormal; Notable for the following components:   Color, Urine STRAW (*)    Hgb urine dipstick SMALL (*)    Protein, ur 100 (*)    Leukocytes,Ua MODERATE (*)    Bacteria, UA MANY (*)    All other components within normal limits  BASIC METABOLIC PANEL WITH GFR - Abnormal; Notable for the following components:   Potassium 2.8 (*)    Glucose, Bld 102 (*)  BUN 52 (*)    Creatinine, Ser 4.15 (*)    Calcium 8.2 (*)    GFR, Estimated 14 (*)    All other components within normal limits  CBC - Abnormal; Notable for the following components:   Hemoglobin 11.8 (*)    HCT 38.2 (*)    RDW 16.3 (*)    nRBC 0.4 (*)    All other components within normal limits  LIPID PANEL - Abnormal; Notable for the following components:   HDL 40 (*)    LDL Cholesterol 120 (*)    All other components within normal limits  CBG MONITORING, ED - Abnormal; Notable for the following components:   Glucose-Capillary 187 (*)    All other components within normal limits  CBG MONITORING, ED - Abnormal; Notable for the following components:   Glucose-Capillary 111 (*)    All other components within normal limits  CBG MONITORING, ED - Abnormal; Notable for the following components:   Glucose-Capillary 138 (*)    All other components within normal limits  TROPONIN I (HIGH SENSITIVITY) - Abnormal; Notable for the following components:   Troponin I (High Sensitivity) 29 (*)    All other components within normal limits  TROPONIN I (HIGH SENSITIVITY) - Abnormal; Notable for the following components:   Troponin I (High Sensitivity) 26 (*)    All other components within normal limits  URINE CULTURE  TROPONIN I (HIGH SENSITIVITY)    EKG: EKG Interpretation Date/Time:  Thursday August 04 2023 17:14:51 EDT Ventricular Rate:  83 PR Interval:  163 QRS Duration:  146 QT Interval:  443 QTC Calculation: 521 R Axis:   -83  Text Interpretation: Sinus rhythm Ventricular bigeminy  Left bundle branch block increased ectopy from prior Confirmed by Towana Sharper [phone]) on 08/04/2023 5:17:12 PM  Radiology: [facility] Chest Port 1 View Result Date: 08/04/2023 CLINICAL DATA:  Weakness. EXAM: PORTABLE CHEST 1 VIEW COMPARISON:  06/24/2023 FINDINGS: Stable cardiomegaly. Mediastinal contours are unchanged. Mild bibasilar atelectasis. No pulmonary edema. No pleural fluid. No pneumothorax. Grossly stable osseous structures. IMPRESSION: Stable cardiomegaly. Mild bibasilar atelectasis. Electronically Signed   By: Andrea Gasman M.D.   On: 08/04/2023 18:59     Procedures   Medications Ordered in the ED  aspirin  EC tablet 81 mg (81 mg Oral Given 08/05/23 0955)  carvedilol  (COREG ) tablet 3.125 mg (3.125 mg Oral Given 08/05/23 0752)  ezetimibe  (ZETIA ) tablet 10 mg (10 mg Oral Given 08/05/23 0958)  furosemide  (LASIX ) tablet 40 mg (40 mg Oral Given 08/05/23 0752)  hydrALAZINE  (APRESOLINE ) tablet 100 mg (100 mg Oral Given 08/05/23 0955)  NIFEdipine  (PROCARDIA -XL/NIFEDICAL-XL) 24 hr tablet 120 mg (120 mg Oral Given 08/05/23 0957)  calcitRIOL  (ROCALTROL ) capsule 0.5 mcg (0.5 mcg Oral Given 08/05/23 0957)  tamsulosin  (FLOMAX ) capsule 0.4 mg (0.4 mg Oral Given 08/05/23 0139)  gabapentin  (NEURONTIN ) capsule 100-200 mg (100 mg Oral Given 08/05/23 0956)  enoxaparin  (LOVENOX ) injection 30 mg (30 mg Subcutaneous Given 08/05/23 0955)  acetaminophen  (TYLENOL ) tablet 650 mg (has no administration in time range)    Or  acetaminophen  (TYLENOL ) suppository 650 mg (has no administration in time range)  ondansetron  (ZOFRAN ) tablet 4 mg (has no administration in time range)    Or  ondansetron  (ZOFRAN ) injection 4 mg (has no administration in time range)  magnesium  hydroxide (MILK OF MAGNESIA) suspension 30 mL (has no administration in time range)  traZODone  (DESYREL ) tablet 25 mg (has no administration in time range)  insulin  glargine-yfgn (SEMGLEE ) injection 40 Units (40 Units Subcutaneous Not  Given 08/05/23 0116)  meropenem   (MERREM ) 1 g in sodium chloride  0.9 % 100 mL IVPB (has no administration in time range)  nitroGLYCERIN  (NITROSTAT ) SL tablet 0.4 mg (has no administration in time range)  insulin  aspart (novoLOG ) injection 0-15 Units ( Subcutaneous Not Given 08/05/23 0757)  insulin  aspart (novoLOG ) injection 0-5 Units (has no administration in time range)  potassium chloride  SA (KLOR-CON  M) CR tablet 40 mEq (40 mEq Oral Given 08/05/23 0752)  multivitamin-lutein  (OCUVITE-LUTEIN ) capsule 1 capsule (1 capsule Oral Given 08/05/23 0957)  HYDROmorphone  (DILAUDID ) injection 1 mg (has no administration in time range)  pantoprazole  (PROTONIX ) EC tablet 40 mg (40 mg Oral Given 08/05/23 0958)  potassium chloride  SA (KLOR-CON  M) CR tablet 40 mEq (40 mEq Oral Given 08/04/23 1954)  meropenem  (MERREM ) 1 g in sodium chloride  0.9 % 100 mL IVPB (0 g Intravenous Stopped 08/05/23 0116)    Clinical Course as of 08/05/23 1021  Thu Aug 04, 2023  2151 Nurse pulled another EKG for possible change in rhythm.  Computer is reading it is acute MI but it looks like he has regular bundle branch block EKG.  He said he is feeling pretty good now.  His troponins had gone from 15-29 so we will get another troponin to see if he is continuing to rise. [MB]  2335 Patient was able to ambulate in the department and did not have any dizziness but he did have some chest tightness.  Urine looks possibly infected.  He had ESBL during his last admission.  He is agreeable to stay [MB]  2340 I updated nephrology Dr. Dennise [MB]    Clinical Course User Index [MB] Towana Ozell BROCKS, MD                                 Medical Decision Making Amount and/or Complexity of Data Reviewed Labs: ordered. Radiology: ordered.  Risk Prescription drug management. Decision regarding hospitalization.   This patient complains of episode of dizzy lightheadedness associated with some nausea; this involves an extensive number of treatment Options and is a complaint that carries  with it a high risk of complications and morbidity. The differential includes arrhythmia, dehydration, metabolic derangement, infection  I ordered, reviewed and interpreted labs, which included CBC with chronically low hemoglobin, chemistries with low potassium chronically elevated BUN and creatinine, troponins mildly elevated, urinalysis possible signs of infection sent for culture I ordered medication IV antibiotics oral potassium and reviewed PMP when indicated. I ordered imaging studies which included chest x-ray and I independently    visualized and interpreted imaging which showed stable cardiomegaly Additional history obtained from patient's daughter Previous records obtained and reviewed in epic, was admitted few months ago for ESBL infection I consulted nephrology Dr. Dennise and discussed lab and imaging findings and discussed disposition.  Cardiac monitoring reviewed, sinus rhythm Social determinants considered, no significant barriers Critical Interventions: None  After the interventions stated above, I reevaluated the patient and found patient to be feeling a little bit better Admission and further testing considered, due to his uptrending troponin and low potassium, possible ESBL infection he would benefit from mission the hospital for continued management.  Because of the peritoneal dialysis he would need admission to [facility].  Have paged out the hospitalist and also gave report to the physician Dr. Roselyn      Final diagnoses:  Lightheadedness  Lower urinary tract infection, acute    ED Discharge Orders  None          Towana Ozell BROCKS, MD 08/05/23 1024

## 2023-08-04 NOTE — Progress Notes (Signed)
 Informed of ESRD patient in ER, discussed with Dr. Towana. Presents with dizziness. Recent admit for ESBL UTI. Last PD last night.  Outpatient PD unit: [facility]  Plan: -patient to be transferred to [facility] especially given that he is a PD patient -given review of labs and CXR, no acute indications for PD tonight -will be seen in AM, will obtain outpatient orders once home therapies clinic is open -please call with any questions/concerns in the interim  Ephriam Stank, MD [facility]

## 2023-08-04 NOTE — ED Notes (Signed)
 Pt ambulated around desk x1 lap Pt states no dizziness or lightheadedness- does have chest tightness that worsens with ambulation per pt Pt O2 98% HR up to 135 during ambulation

## 2023-08-05 ENCOUNTER — Other Ambulatory Visit: Payer: Self-pay | Admitting: Physician Assistant

## 2023-08-05 DIAGNOSIS — R55 Syncope and collapse: Secondary | ICD-10-CM | POA: Diagnosis not present

## 2023-08-05 DIAGNOSIS — E1165 Type 2 diabetes mellitus with hyperglycemia: Secondary | ICD-10-CM | POA: Diagnosis present

## 2023-08-05 DIAGNOSIS — Z6828 Body mass index (BMI) 28.0-28.9, adult: Secondary | ICD-10-CM | POA: Diagnosis not present

## 2023-08-05 DIAGNOSIS — R42 Dizziness and giddiness: Secondary | ICD-10-CM

## 2023-08-05 DIAGNOSIS — I10 Essential (primary) hypertension: Secondary | ICD-10-CM | POA: Diagnosis not present

## 2023-08-05 DIAGNOSIS — N390 Urinary tract infection, site not specified: Secondary | ICD-10-CM

## 2023-08-05 DIAGNOSIS — Z992 Dependence on renal dialysis: Secondary | ICD-10-CM

## 2023-08-05 DIAGNOSIS — E669 Obesity, unspecified: Secondary | ICD-10-CM | POA: Diagnosis present

## 2023-08-05 DIAGNOSIS — E876 Hypokalemia: Secondary | ICD-10-CM

## 2023-08-05 DIAGNOSIS — E11649 Type 2 diabetes mellitus with hypoglycemia without coma: Secondary | ICD-10-CM | POA: Diagnosis not present

## 2023-08-05 DIAGNOSIS — R000 Tachycardia, unspecified: Secondary | ICD-10-CM | POA: Diagnosis not present

## 2023-08-05 DIAGNOSIS — J9811 Atelectasis: Secondary | ICD-10-CM | POA: Diagnosis present

## 2023-08-05 DIAGNOSIS — I132 Hypertensive heart and chronic kidney disease with heart failure and with stage 5 chronic kidney disease, or end stage renal disease: Secondary | ICD-10-CM | POA: Diagnosis present

## 2023-08-05 DIAGNOSIS — I5032 Chronic diastolic (congestive) heart failure: Secondary | ICD-10-CM | POA: Diagnosis present

## 2023-08-05 DIAGNOSIS — I493 Ventricular premature depolarization: Secondary | ICD-10-CM | POA: Diagnosis present

## 2023-08-05 DIAGNOSIS — K219 Gastro-esophageal reflux disease without esophagitis: Secondary | ICD-10-CM

## 2023-08-05 DIAGNOSIS — N400 Enlarged prostate without lower urinary tract symptoms: Secondary | ICD-10-CM

## 2023-08-05 DIAGNOSIS — N2581 Secondary hyperparathyroidism of renal origin: Secondary | ICD-10-CM | POA: Diagnosis present

## 2023-08-05 DIAGNOSIS — T501X5A Adverse effect of loop [high-ceiling] diuretics, initial encounter: Secondary | ICD-10-CM | POA: Diagnosis present

## 2023-08-05 DIAGNOSIS — N401 Enlarged prostate with lower urinary tract symptoms: Secondary | ICD-10-CM | POA: Diagnosis present

## 2023-08-05 DIAGNOSIS — Z743 Need for continuous supervision: Secondary | ICD-10-CM | POA: Diagnosis not present

## 2023-08-05 DIAGNOSIS — E785 Hyperlipidemia, unspecified: Secondary | ICD-10-CM | POA: Diagnosis present

## 2023-08-05 DIAGNOSIS — E1142 Type 2 diabetes mellitus with diabetic polyneuropathy: Secondary | ICD-10-CM

## 2023-08-05 DIAGNOSIS — Z794 Long term (current) use of insulin: Secondary | ICD-10-CM | POA: Diagnosis not present

## 2023-08-05 DIAGNOSIS — R079 Chest pain, unspecified: Secondary | ICD-10-CM | POA: Diagnosis present

## 2023-08-05 DIAGNOSIS — D631 Anemia in chronic kidney disease: Secondary | ICD-10-CM | POA: Diagnosis present

## 2023-08-05 DIAGNOSIS — B9620 Unspecified Escherichia coli [E. coli] as the cause of diseases classified elsewhere: Secondary | ICD-10-CM | POA: Diagnosis present

## 2023-08-05 DIAGNOSIS — E1122 Type 2 diabetes mellitus with diabetic chronic kidney disease: Secondary | ICD-10-CM | POA: Diagnosis present

## 2023-08-05 DIAGNOSIS — I447 Left bundle-branch block, unspecified: Secondary | ICD-10-CM | POA: Diagnosis present

## 2023-08-05 DIAGNOSIS — N186 End stage renal disease: Secondary | ICD-10-CM | POA: Diagnosis present

## 2023-08-05 DIAGNOSIS — H3530 Unspecified macular degeneration: Secondary | ICD-10-CM | POA: Diagnosis present

## 2023-08-05 LAB — BASIC METABOLIC PANEL WITH GFR
Anion gap: 9 (ref 5–15)
Anion gap: 12 (ref 5–15)
BUN: 52 mg/dL — ABNORMAL HIGH (ref 8–23)
BUN: 48 mg/dL — ABNORMAL HIGH (ref 8–23)
CO2: 24 mmol/L (ref 22–32)
CO2: 22 mmol/L (ref 22–32)
Calcium: 8.2 mg/dL — ABNORMAL LOW (ref 8.9–10.3)
Calcium: 8.9 mg/dL (ref 8.9–10.3)
Chloride: 109 mmol/L (ref 98–111)
Chloride: 107 mmol/L (ref 98–111)
Creatinine, Ser: 4.15 mg/dL — ABNORMAL HIGH (ref 0.61–1.24)
Creatinine, Ser: 3.9 mg/dL — ABNORMAL HIGH (ref 0.61–1.24)
GFR, Estimated: 14 mL/min — ABNORMAL LOW (ref >60)
GFR, Estimated: 15 mL/min — ABNORMAL LOW (ref >60)
Glucose, Bld: 102 mg/dL — ABNORMAL HIGH (ref 70–99)
Glucose, Bld: 257 mg/dL — ABNORMAL HIGH (ref 70–99)
Potassium: 2.8 mmol/L — ABNORMAL LOW (ref 3.5–5.1)
Potassium: 3.8 mmol/L (ref 3.5–5.1)
Sodium: 142 mmol/L (ref 135–145)
Sodium: 141 mmol/L (ref 135–145)

## 2023-08-05 LAB — LIPID PANEL
Cholesterol: 181 mg/dL (ref 0–200)
HDL: 40 mg/dL — ABNORMAL LOW (ref >40)
LDL Cholesterol: 120 mg/dL — ABNORMAL HIGH (ref 0–99)
Total CHOL/HDL Ratio: 4.5 ratio
Triglycerides: 105 mg/dL (ref <150)
VLDL: 21 mg/dL (ref 0–40)

## 2023-08-05 LAB — CBC
HCT: 38.2 % — ABNORMAL LOW (ref 39.0–52.0)
Hemoglobin: 11.8 g/dL — ABNORMAL LOW (ref 13.0–17.0)
MCH: 26.9 pg (ref 26.0–34.0)
MCHC: 30.9 g/dL (ref 30.0–36.0)
MCV: 87 fL (ref 80.0–100.0)
Platelets: 233 K/uL (ref 150–400)
RBC: 4.39 MIL/uL (ref 4.22–5.81)
RDW: 16.3 % — ABNORMAL HIGH (ref 11.5–15.5)
WBC: 5.1 K/uL (ref 4.0–10.5)
nRBC: 0.4 % — ABNORMAL HIGH (ref 0.0–0.2)

## 2023-08-05 LAB — PHOSPHORUS: Phosphorus: 3.1 mg/dL (ref 2.5–4.6)

## 2023-08-05 LAB — GLUCOSE, CAPILLARY
Glucose-Capillary: 121 mg/dL — ABNORMAL HIGH (ref 70–99)
Glucose-Capillary: 220 mg/dL — ABNORMAL HIGH (ref 70–99)
Glucose-Capillary: 121 mg/dL — ABNORMAL HIGH (ref 70–99)

## 2023-08-05 LAB — CBG MONITORING, ED
Glucose-Capillary: 111 mg/dL — ABNORMAL HIGH (ref 70–99)
Glucose-Capillary: 138 mg/dL — ABNORMAL HIGH (ref 70–99)

## 2023-08-05 MED ORDER — CARVEDILOL 3.125 MG PO TABS
3.1250 mg | ORAL_TABLET | Freq: Two times a day (BID) | ORAL | Status: DC
  Administered 2023-08-05 – 2023-08-08 (×7): 3.125 mg via ORAL
  Filled 2023-08-05 (×7): qty 1

## 2023-08-05 MED ORDER — PANTOPRAZOLE SODIUM 40 MG PO TBEC: 40.0000 mg | DELAYED_RELEASE_TABLET | Freq: Every day | ORAL | Status: DC

## 2023-08-05 MED ORDER — ASPIRIN 81 MG PO TBEC
81.0000 mg | DELAYED_RELEASE_TABLET | Freq: Every morning | ORAL | Status: DC
  Administered 2023-08-05 – 2023-08-08 (×4): 81 mg via ORAL
  Filled 2023-08-05 (×4): qty 1

## 2023-08-05 MED ORDER — SODIUM CHLORIDE 0.9 % IV SOLN
1.0000 g | Freq: Once | INTRAVENOUS | Status: AC
  Administered 2023-08-05: 1 g via INTRAVENOUS
  Filled 2023-08-05: qty 20

## 2023-08-05 MED ORDER — ACETAMINOPHEN 325 MG PO TABS: 650.0000 mg | ORAL_TABLET | Freq: Four times a day (QID) | ORAL | Status: DC | PRN

## 2023-08-05 MED ORDER — GENTAMICIN SULFATE 0.1 % EX CREA
1.0000 | TOPICAL_CREAM | Freq: Every day | CUTANEOUS | Status: DC
  Administered 2023-08-06 – 2023-08-08 (×3): 1 via TOPICAL
  Filled 2023-08-05 (×2): qty 15

## 2023-08-05 MED ORDER — GABAPENTIN 100 MG PO CAPS
100.0000 mg | ORAL_CAPSULE | Freq: Two times a day (BID) | ORAL | Status: DC
  Administered 2023-08-05 – 2023-08-08 (×8): 100 mg via ORAL
  Filled 2023-08-05 (×8): qty 1

## 2023-08-05 MED ORDER — INSULIN ASPART 100 UNIT/ML IJ SOLN: 0.0000 [IU] | Freq: Every day | INTRAMUSCULAR | Status: DC

## 2023-08-05 MED ORDER — HYDRALAZINE HCL 25 MG PO TABS
100.0000 mg | ORAL_TABLET | Freq: Two times a day (BID) | ORAL | Status: DC
  Administered 2023-08-05 – 2023-08-08 (×8): 100 mg via ORAL
  Filled 2023-08-05 (×8): qty 4

## 2023-08-05 MED ORDER — TRAZODONE HCL 50 MG PO TABS
25.0000 mg | ORAL_TABLET | Freq: Every evening | ORAL | Status: DC | PRN
  Filled 2023-08-05: qty 1

## 2023-08-05 MED ORDER — ACETAMINOPHEN 650 MG RE SUPP
650.0000 mg | Freq: Four times a day (QID) | RECTAL | Status: DC | PRN
Start: 2023-08-05 — End: 2023-08-08

## 2023-08-05 MED ORDER — DELFLEX-LC/1.5% DEXTROSE 344 MOSM/L IP SOLN: INTRAPERITONEAL | Status: DC

## 2023-08-05 MED ORDER — PRESERVISION AREDS 2 PO CAPS: 1.0000 | ORAL_CAPSULE | Freq: Two times a day (BID) | ORAL | Status: DC

## 2023-08-05 MED ORDER — TAMSULOSIN HCL 0.4 MG PO CAPS
0.4000 mg | ORAL_CAPSULE | Freq: Every day | ORAL | Status: DC
  Administered 2023-08-05 – 2023-08-07 (×4): 0.4 mg via ORAL
  Filled 2023-08-05 (×4): qty 1

## 2023-08-05 MED ORDER — INSULIN ASPART 100 UNIT/ML IJ SOLN
0.0000 [IU] | Freq: Three times a day (TID) | INTRAMUSCULAR | Status: DC
  Administered 2023-08-05: 2 [IU] via SUBCUTANEOUS
  Administered 2023-08-05: 5 [IU] via SUBCUTANEOUS

## 2023-08-05 MED ORDER — NITROGLYCERIN 0.4 MG SL SUBL: 0.4000 mg | SUBLINGUAL_TABLET | SUBLINGUAL | Status: DC | PRN

## 2023-08-05 MED ORDER — EZETIMIBE 10 MG PO TABS
10.0000 mg | ORAL_TABLET | Freq: Every day | ORAL | Status: DC
  Administered 2023-08-05 – 2023-08-08 (×4): 10 mg via ORAL
  Filled 2023-08-05 (×6): qty 1

## 2023-08-05 MED ORDER — DELFLEX-LC/4.25% DEXTROSE 483 MOSM/L IP SOLN: INTRAPERITONEAL | Status: DC

## 2023-08-05 MED ORDER — PROSIGHT PO TABS
1.0000 | ORAL_TABLET | Freq: Every day | ORAL | Status: DC
  Filled 2023-08-05 (×2): qty 1

## 2023-08-05 MED ORDER — DELFLEX-LC/2.5% DEXTROSE 394 MOSM/L IP SOLN: INTRAPERITONEAL | Status: DC

## 2023-08-05 MED ORDER — CALCITRIOL 0.5 MCG PO CAPS
0.5000 ug | ORAL_CAPSULE | Freq: Two times a day (BID) | ORAL | Status: DC
  Administered 2023-08-05 – 2023-08-08 (×8): 0.5 ug via ORAL
  Filled 2023-08-05 (×3): qty 1
  Filled 2023-08-05 (×2): qty 2
  Filled 2023-08-05 (×3): qty 1

## 2023-08-05 MED ORDER — POTASSIUM CHLORIDE 10 MEQ/100ML IV SOLN
10.0000 meq | INTRAVENOUS | Status: AC
  Administered 2023-08-05 (×4): 10 meq via INTRAVENOUS
  Filled 2023-08-05 (×4): qty 100

## 2023-08-05 MED ORDER — PANTOPRAZOLE SODIUM 40 MG PO TBEC
40.0000 mg | DELAYED_RELEASE_TABLET | Freq: Two times a day (BID) | ORAL | Status: DC
  Administered 2023-08-05 – 2023-08-08 (×7): 40 mg via ORAL
  Filled 2023-08-05 (×7): qty 1

## 2023-08-05 MED ORDER — SODIUM CHLORIDE 0.9 % IV SOLN
1.0000 g | Freq: Two times a day (BID) | INTRAVENOUS | Status: DC
  Administered 2023-08-05 – 2023-08-07 (×5): 1 g via INTRAVENOUS
  Filled 2023-08-05 (×5): qty 20

## 2023-08-05 MED ORDER — INSULIN GLARGINE-YFGN 100 UNIT/ML ~~LOC~~ SOLN
40.0000 [IU] | Freq: Two times a day (BID) | SUBCUTANEOUS | Status: DC
  Administered 2023-08-05 (×2): 40 [IU] via SUBCUTANEOUS
  Filled 2023-08-05 (×6): qty 0.4

## 2023-08-05 MED ORDER — OCUVITE-LUTEIN PO CAPS
1.0000 | ORAL_CAPSULE | Freq: Every day | ORAL | Status: DC
  Administered 2023-08-05: 1 via ORAL
  Filled 2023-08-05 (×3): qty 1

## 2023-08-05 MED ORDER — MAGNESIUM HYDROXIDE 400 MG/5ML PO SUSP: 30.0000 mL | Freq: Every day | ORAL | Status: DC | PRN

## 2023-08-05 MED ORDER — MORPHINE SULFATE (PF) 2 MG/ML IV SOLN: 2.0000 mg | INTRAVENOUS | Status: DC | PRN

## 2023-08-05 MED ORDER — ENOXAPARIN SODIUM 30 MG/0.3ML IJ SOSY
30.0000 mg | PREFILLED_SYRINGE | INTRAMUSCULAR | Status: DC
  Administered 2023-08-05 – 2023-08-08 (×4): 30 mg via SUBCUTANEOUS
  Filled 2023-08-05 (×4): qty 0.3

## 2023-08-05 MED ORDER — POTASSIUM CHLORIDE CRYS ER 20 MEQ PO TBCR
40.0000 meq | EXTENDED_RELEASE_TABLET | ORAL | Status: AC
  Administered 2023-08-05 (×2): 40 meq via ORAL
  Filled 2023-08-05 (×2): qty 2

## 2023-08-05 MED ORDER — ONDANSETRON HCL 4 MG PO TABS: 4.0000 mg | ORAL_TABLET | Freq: Four times a day (QID) | ORAL | Status: DC | PRN

## 2023-08-05 MED ORDER — SODIUM CHLORIDE 0.9 % IV SOLN: 1.0000 g | Freq: Three times a day (TID) | INTRAVENOUS | Status: DC

## 2023-08-05 MED ORDER — FUROSEMIDE 40 MG PO TABS
40.0000 mg | ORAL_TABLET | Freq: Two times a day (BID) | ORAL | Status: DC
  Administered 2023-08-05 – 2023-08-08 (×7): 40 mg via ORAL
  Filled 2023-08-05 (×7): qty 1

## 2023-08-05 MED ORDER — ONDANSETRON HCL 4 MG/2ML IJ SOLN: 4.0000 mg | Freq: Four times a day (QID) | INTRAMUSCULAR | Status: DC | PRN

## 2023-08-05 MED ORDER — HYDROMORPHONE HCL 1 MG/ML IJ SOLN: 1.0000 mg | INTRAMUSCULAR | Status: DC | PRN

## 2023-08-05 MED ORDER — NIFEDIPINE ER OSMOTIC RELEASE 60 MG PO TB24
120.0000 mg | ORAL_TABLET | Freq: Every day | ORAL | Status: DC
  Administered 2023-08-05 – 2023-08-08 (×4): 120 mg via ORAL
  Filled 2023-08-05 (×2): qty 2
  Filled 2023-08-05: qty 4
  Filled 2023-08-05: qty 2

## 2023-08-05 MED ORDER — INSULIN GLARGINE 100 UNITS/ML SOLOSTAR PEN: 40.0000 [IU] | PEN_INJECTOR | Freq: Two times a day (BID) | SUBCUTANEOUS | Status: DC

## 2023-08-05 NOTE — Assessment & Plan Note (Signed)
-   Pending urine culture and sensitivity will continue IV meropenem  given history of ESBL UTI.

## 2023-08-05 NOTE — Progress Notes (Signed)
 ED Pharmacy Antibiotic Sign Off An antibiotic consult was received from an ED provider for meropenem  per pharmacy dosing for recent h/o ESBL infection. A chart review was completed to assess appropriateness.   The following one time order(s) were placed:  Meropenem  1g IV x1  Further antibiotic and/or antibiotic pharmacy consults should be ordered by the admitting provider if indicated.   Thank you for allowing pharmacy to be a part of this patient's care.   Marvetta Dauphin, PharmD, BCPS   Clinical Pharmacist 08/05/23 12:02 AM

## 2023-08-05 NOTE — Assessment & Plan Note (Signed)
 Will continue PPI therapy.

## 2023-08-05 NOTE — Assessment & Plan Note (Signed)
-   We will continue antihypertensive therapy.

## 2023-08-05 NOTE — Assessment & Plan Note (Signed)
Will continue Flomax.

## 2023-08-05 NOTE — Assessment & Plan Note (Signed)
-   Nephrology consult will be obtained. - Dr. Dennise was notified about patient.

## 2023-08-05 NOTE — Progress Notes (Signed)
 PHARMACY NOTE:  ANTIMICROBIAL RENAL DOSAGE ADJUSTMENT  Current antimicrobial regimen includes a mismatch between antimicrobial dosage and estimated renal function.  As per policy approved by the Pharmacy & Therapeutics and Medical Executive Committees, the antimicrobial dosage will be adjusted accordingly.  Current antimicrobial dosage:  Meropenem  1g IV q8h  Indication: UTI with h/o ESBL  Renal Function:  Estimated Creatinine Clearance: 14.3 mL/min (A) (by C-G formula based on SCr of 4.73 mg/dL (H)).      Antimicrobial dosage has been changed to:  Meropenem  1g IV q12h   Thank you for allowing pharmacy to be a part of this patient's care.  Bee Marchiano, PharmD 08/05/2023 2:17 AM

## 2023-08-05 NOTE — Progress Notes (Signed)
 Consent signed and in chart.  PD treatment initiated using aseptic technique.    Patient awake and alert.  No complaints of pain.  PD exit site clean dry and intact without signs of infection.  Dressing reinforced.   Pre Treatment VS:  97.8 147/64 20 80 Weight:  95.6 kg  Hand off given to patient nurse.  Education provided to staff in regards to PD machine and how to contact [facility] if machine alarms.    Bobetta SAILOR [facility]

## 2023-08-05 NOTE — H&P (Signed)
 [place]   PATIENT NAME: Carlos Gill    MR#:  [id number]  DATE OF BIRTH:  1940-03-25  DATE OF ADMISSION:  08/04/2023  PRIMARY CARE PHYSICIAN: [name], MD   Patient is coming from: Home  REQUESTING/REFERRING PHYSICIAN: Towana Sharper, MD  CHIEF COMPLAINT:   Chief Complaint  Patient presents with   Dizziness    HISTORY OF PRESENT ILLNESS:  Carlos Gill is a 83 y.o. African-American male with medical history significant for type 2 diabetes mellitus, end-stage renal disease on peritoneal dialysis, hypertension, macular degeneration, chronic diastolic CHF, and GERD, who presented to the emergency room because of lightheadedness and dizziness that started earlier during the day without presyncope or syncope, paresthesias or focal muscle weakness.  Experienced chest pain on exertion upon ambulation in the ER without radiation,, moderate intensity with no nausea or vomiting or diaphoresis.  He related to urinary frequency and urgency without dysuria or hematuria or flank pain.  No dyspnea or cough or wheezing.  ED Course: Upon presentation to the emergency room, BP was 159/78 with otherwise normal vital signs.  Labs revealed a previous 2.9 SGOT of 20 with blood glucose of 188 and BUN 55 creatinine 4.73 and calcium of 8.4.  High sensitive troponin I was 15, later 29 and 26.  CBC showed mild anemia hemoglobin 11.3 hematocrit 34.2.  UA was positive for UTI urine culture was sent. EKG as reviewed by me :  EKG showed sinus rhythm rate of 92 with PACs, nonspecific IVCD with LAD and LVH.. Imaging: Portable chest x-ray showed stable cardiomegaly with mild bibasilar atelectasis.  The patient was given 40 mg of p.o. potassium chloride  and 1 g of IV meropenem .  Contact was made with Dr. Dennise with nephrology.  The patient will be admitted to a cardiac telemetry observation bed at [facility] for further evaluation and management. PAST MEDICAL HISTORY:   Past Medical History:  Diagnosis Date    Abnormal result of other cardiovascular function study 04/21/2021   Abnormality of plasma protein, unspecified 04/21/2021   Acute kidney failure, unspecified (HCC) 04/21/2021   Acute on chronic diastolic (congestive) heart failure (HCC) 04/21/2021   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   Adverse effect of antihyperlipidemic and antiarteriosclerotic drugs, initial encounter 12/19/2017   Benign prostatic hyperplasia with urinary obstruction 04/21/2021   Formatting of this note might be different from the original. Last Assessment & Plan:  Formatting of this note is different from the original.  - We will continue Flomax .   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   Obesity 04/11/2017   Osteoarthritis 11/11/2008   Other chest pain 04/21/2021   Proteinuria 01/21/2021   Rhinitis, chronic 03/09/2021   Shortness of breath 04/21/2021   Sleep apnea    cpap   Trochanteric bursitis of right hip 04/27/2017   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    PAST SURGICAL HISTORY:   Past Surgical History:  Procedure Laterality Date   ACHILLES TENDON REPAIR Left 2014   AV FISTULA PLACEMENT Left  08/16/2022   Procedure: LEFT ARM  BRACHIOCEPHALIC ARTERIOVENOUS (AV) FISTULA CREATION;  Surgeon: Magda Debby SAILOR, MD;  Location: [facility] OR;  Service: Vascular;  Laterality: Left;  with regional block   CATARACT EXTRACTION W/ INTRAOCULAR LENS  IMPLANT, BILATERAL  2011   IR FLUORO GUIDE CV LINE  RIGHT  05/30/2023   IR REMOVAL TUN CV CATH W/O FL  06/06/2023   IR US  GUIDE VASC ACCESS RIGHT  05/30/2023    SOCIAL HISTORY:   Social History   Tobacco Use   Smoking status: Former    Current packs/day: 0.00    Types: Cigarettes    Quit date: 02/01/1990    Years since quitting: 33.5   Smokeless tobacco: Never  Substance Use Topics   Alcohol  use: No    FAMILY HISTORY:   Family History  Problem Relation Age of Onset   Diabetes Mother    Prostate cancer Maternal Grandfather    Colon cancer Neg Hx     DRUG ALLERGIES:   Allergies  Allergen Reactions   Statins Swelling and Other (See Comments)    Muscle aches   Wound Dressing Adhesive Dermatitis    Pt has rash on L arm from tape used with stitches    REVIEW OF SYSTEMS:   ROS As per history of present illness. All pertinent systems were reviewed above. Constitutional, HEENT, cardiovascular, respiratory, GI, GU, musculoskeletal, neuro, psychiatric, endocrine, integumentary and hematologic systems were reviewed and are otherwise negative/unremarkable except for positive findings mentioned above in the HPI.   MEDICATIONS AT HOME:   Prior to Admission medications   Medication Sig Start Date End Date Taking? Authorizing Provider  acetaminophen  (TYLENOL ) 500 MG tablet Take 1,000 mg by mouth daily as needed for moderate pain.   Yes [provider]  aspirin  EC 81 MG tablet Take 81 mg by mouth every morning.   Yes [provider]  calcitRIOL  (ROCALTROL ) 0.5 MCG capsule Take 0.5 mcg by mouth 2 (two) times daily. 02/13/21  Yes [provider]  carvedilol  (COREG ) 3.125 MG tablet Take 3.125 mg by mouth 2 (two) times daily with a meal.   Yes [provider]  cephALEXin  (KEFLEX ) 250 MG capsule Take 1 capsule (250 mg total) by mouth 2 (two) times daily. 08/04/23  Yes Towana Ozell BROCKS, MD  epoetin alfa (EPOGEN) 4000 UNIT/ML injection Inject 4,000 Units into the vein every 30 (thirty) days.   Yes [provider]  ezetimibe  (ZETIA ) 10 MG tablet Take 10 mg by mouth daily.   Yes [provider]  furosemide  (LASIX ) 40 MG tablet Take 40 mg by mouth 2 (two) times daily. 01/19/21  Yes [provider]  gabapentin  (NEURONTIN ) 100 MG capsule Take 100-200 mg by mouth 2 (two) times daily. Take 100 mg in the morning and 200 mg at bedtime 02/08/19  Yes [provider]  hydrALAZINE  (APRESOLINE ) 100 MG tablet Take 100 mg by mouth 2 (two) times daily. 08/19/22  Yes [provider]  insulin  aspart (NOVOLOG ) 100 UNIT/ML injection Inject 6-18 Units into the skin 3 (three) times daily with meals. Per sliding scale, 1 UNIT FOR EVERY 10 GRAMS CARBOHYDRATES, CORRECTION   Yes [provider]  insulin  glargine (LANTUS ) 100 units/mL SOLN Inject 40 Units into the skin 2 (two) times daily.   Yes [provider]  Multiple Vitamins-Minerals (PRESERVISION AREDS 2) CAPS Take 1 capsule by mouth 2 (two) times daily.   Yes [provider]  NIFEdipine  (ADALAT  CC) 60 MG 24 hr tablet  Take 120 mg by mouth daily.   Yes [provider]  omeprazole (PRILOSEC) 20 MG capsule Take 20 mg by mouth daily.   Yes [provider]  tamsulosin  (FLOMAX ) 0.4 MG CAPS capsule Take 0.4 mg by mouth at bedtime.   Yes [provider]  Continuous Glucose Sensor (FREESTYLE LIBRE 2 SENSOR) MISC Inject 1 Device into the skin every 14 (fourteen) days. 06/24/21   [provider]      VITAL SIGNS:  Blood pressure (!) 177/82, pulse 78, temperature 97.9 F (36.6 C), temperature source Oral, resp. rate 18, weight 97.5 kg, SpO2 99%.  PHYSICAL EXAMINATION:  Physical Exam  GENERAL:  83 y.o.-year-old African-American male patient lying in the bed with no acute distress.  EYES: Pupils equal, round, reactive to light and accommodation. No scleral icterus. Extraocular muscles intact.  HEENT: Head atraumatic, normocephalic. Oropharynx and nasopharynx clear.  NECK:   Supple, no jugular venous distention. No thyroid enlargement, no tenderness.  LUNGS: Normal breath sounds bilaterally, no wheezing, rales,rhonchi or crepitation. No use of accessory muscles of respiration.  CARDIOVASCULAR: Regular rate and rhythm, S1, S2 normal. No murmurs, rubs, or gallops.  ABDOMEN: Soft, nondistended, nontender. Bowel sounds present. No organomegaly or mass.  EXTREMITIES: No pedal edema, cyanosis, or clubbing.  NEUROLOGIC: Cranial nerves II through XII are intact. Muscle strength 5/5 in all extremities. Sensation intact. Gait not checked.  PSYCHIATRIC: The patient is alert and oriented x 3.  Normal affect and good eye contact. SKIN: No obvious rash, lesion, or ulcer.   LABORATORY PANEL:   CBC Recent Labs  Lab 08/04/23 1730  WBC 4.7  HGB 11.3*  HCT 34.2*  PLT 243   ------------------------------------------------------------------------------------------------------------------  Chemistries  Recent Labs  Lab 08/04/23 1730  NA 137  K 2.9*  CL 103  CO2 20*  GLUCOSE 188*  BUN 55*  CREATININE 4.73*  CALCIUM 8.4*   ------------------------------------------------------------------------------------------------------------------  Cardiac Enzymes No results for input(s): TROPONINI in the last 168 hours. ------------------------------------------------------------------------------------------------------------------  RADIOLOGY:  DG Chest Port 1 View Result Date: 08/04/2023 CLINICAL DATA:  Weakness. EXAM: PORTABLE CHEST 1 VIEW COMPARISON:  06/24/2023 FINDINGS: Stable cardiomegaly. Mediastinal contours are unchanged. Mild bibasilar atelectasis. No pulmonary edema. No pleural fluid. No pneumothorax. Grossly stable osseous structures. IMPRESSION: Stable cardiomegaly. Mild bibasilar atelectasis. Electronically Signed   By: Andrea Gasman M.D.   On: 08/04/2023 18:59      IMPRESSION AND PLAN:  Assessment and Plan: * Chest pain - The patient will be admitted  to an observation cardiac telemetry bed at [facility]. - Will follow serial troponins and EKGs. - The patient will be placed on aspirin  as well as p.r.n. sublingual nitroglycerin  and morphine  sulfate for pain. - Cardiology consult can be called in a.m.  Acute lower UTI - Pending urine culture and sensitivity will continue IV meropenem  given history of ESBL UTI.  ESRD on peritoneal dialysis [facility]) - Nephrology consult will be obtained. - Dr. Dennise was notified about patient.  Essential hypertension - We will continue antihypertensive therapy.  Type 2 diabetes mellitus with peripheral neuropathy (HCC) - The patient will be placed on supplemental coverage with NovoLog . - Will continue basal coverage. - Will continue Neurontin .  BPH (benign prostatic hyperplasia) - Will continue Flomax .  Dyslipidemia - Will continue Zetia .  GERD without esophagitis - Will continue PPI therapy.   DVT prophylaxis: Lovenox . Advanced Care Planning:  Code Status: full code. Family Communication:  The plan of care was discussed in details with the patient (and family). I answered  all questions. The patient agreed to proceed with the above mentioned plan. Further management will depend upon hospital course. Disposition Plan: Back to previous home environment Consults called: Nephrology All the records are reviewed and case discussed with ED provider.  Status is: Observation  I certify that at the time of admission, it is my clinical judgment that the patient will require hospital care extending less than 2 midnights.                            Dispo: The patient is from: Home              Anticipated d/c is to: Home              Patient currently is not medically stable to d/c.              Difficult to place patient: No  Madison DELENA Peaches M.D on 08/05/2023 at 2:16 AM  [facility]   From 7 PM-7 AM, contact night-coverage [email or web address]  CC: Primary care physician; [name], MD

## 2023-08-05 NOTE — Consult Note (Signed)
 ESRD Consult Note  Requesting provider: Ripudeep Rai Service requesting consult: Hospitalist Reason for consult: ESRD, provision of dialysis Indication for acute dialysis?: End Stage Renal Disease  Outpatient dialysis unit: [facility]patient dialysis prescription:      4 cycles, 2.5 L fill volume, usually uses 2.5% fluids  Usual wt ~ 97kg  Assessment/Recommendations: Carlos Gill is a/an 83 y.o. male with a past medical history notable for ESRD on HD admitted with UTI.   # ESRD: Continue PD nightly with his typical prescription  # Volume/ hypertension: Appears euvolemic.  Maintain volume status with peritoneal dialysis.  Continue home blood pressure medications. On home diuretics  # Anemia of Chronic Kidney Disease: Hemoglobin at goal without ESA  # Secondary Hyperparathyroidism/Hyperphosphatemia: Continue home calcitriol .  Obtain phosphorus level  # Hypokalemia: Repeat level now.  Replace as needed  # UTI: History of ESBL E. coli.  Antibiotics per primary team.  Symptoms were very severe.  No dysuria and was having urinary frequency.  Given recurrent nature and also questionable symptoms may want to involve urology and/or infectious disease to determine if further antibiotics are needed.  Ongoing resistance would be a concern   # Additional recommendations: - Dose all meds for creatinine clearance < 10 ml/min  - Unless absolutely necessary, no MRIs with gadolinium.  - Implement save arm precautions.  Prefer needle sticks in the dorsum of the hands or wrists.  No blood pressure measurements in arm. - If blood transfusion is requested during hemodialysis sessions, please alert us  prior to the session.  - Use synthetic opioids (Fentanyl /Dilaudid ) if needed  Recommendations were discussed with the primary team.   History of Present Illness: Carlos Gill is a/an 83 y.o. male with a past medical history of ESRD who presents with dizziness this.  Patient presented to the  hospital yesterday for dizziness at home.  He also experienced some chest pain with exertion.  He also had noticed persistent and worsening urinary frequency with urgency and no dysuria, hematuria.  Also denied fevers, chills, nausea, vomiting, diarrhea.  He has been undergoing peritoneal dialysis without significant issues.  Denies any cloudy effluent or abdominal pain.  He was recently hospitalized and discharged on 5/30 after having the UTI  In the emergency department vital signs were fairly reassuring.  Troponin was fairly normal.  Urinalysis concerning for pyuria.  Some hypokalemia was given replacement.   Medications:  Current Facility-Administered Medications  Medication Dose Route Frequency Provider Last Rate Last Admin   acetaminophen  (TYLENOL ) tablet 650 mg  650 mg Oral Q6H PRN Mansy, Jan A, MD       Or   acetaminophen  (TYLENOL ) suppository 650 mg  650 mg Rectal Q6H PRN Mansy, Jan A, MD       aspirin  EC tablet 81 mg  81 mg Oral q morning Mansy, Jan A, MD   81 mg at 08/05/23 0955   calcitRIOL  (ROCALTROL ) capsule 0.5 mcg  0.5 mcg Oral BID Mansy, Jan A, MD   0.5 mcg at 08/05/23 0957   carvedilol  (COREG ) tablet 3.125 mg  3.125 mg Oral BID WC Mansy, Jan A, MD   3.125 mg at 08/05/23 0752   enoxaparin  (LOVENOX ) injection 30 mg  30 mg Subcutaneous Q24H Mansy, Jan A, MD   30 mg at 08/05/23 0955   ezetimibe  (ZETIA ) tablet 10 mg  10 mg Oral Daily Mansy, Jan A, MD   10 mg at 08/05/23 9041   furosemide  (LASIX ) tablet 40 mg  40 mg Oral  BID Mansy, Jan A, MD   40 mg at 08/05/23 9247   gabapentin  (NEURONTIN ) capsule 100-200 mg  100-200 mg Oral BID Mansy, Jan A, MD   100 mg at 08/05/23 9043   hydrALAZINE  (APRESOLINE ) tablet 100 mg  100 mg Oral BID Mansy, Jan A, MD   100 mg at 08/05/23 9044   HYDROmorphone  (DILAUDID ) injection 1 mg  1 mg Intravenous Q4H PRN Ricky Fines, MD       insulin  aspart (novoLOG ) injection 0-15 Units  0-15 Units Subcutaneous TID WC Mansy, Madison LABOR, MD   2 Units at 08/05/23 1254    insulin  aspart (novoLOG ) injection 0-5 Units  0-5 Units Subcutaneous QHS Mansy, Jan A, MD       insulin  glargine-yfgn (SEMGLEE ) injection 40 Units  40 Units Subcutaneous BID Mansy, Jan A, MD   40 Units at 08/05/23 1023   magnesium  hydroxide (MILK OF MAGNESIA) suspension 30 mL  30 mL Oral Daily PRN Mansy, Jan A, MD       meropenem  (MERREM ) 1 g in sodium chloride  0.9 % 100 mL IVPB  1 g Intravenous Q12H Poindexter, Leann T, RPH 200 mL/hr at 08/05/23 1300 1 g at 08/05/23 1300   multivitamin-lutein  (OCUVITE-LUTEIN ) capsule 1 capsule  1 capsule Oral Daily Ricky Fines, MD   1 capsule at 08/05/23 0957   NIFEdipine  (PROCARDIA  XL/NIFEDICAL XL) 24 hr tablet 120 mg  120 mg Oral Daily Mansy, Jan A, MD   120 mg at 08/05/23 0957   nitroGLYCERIN  (NITROSTAT ) SL tablet 0.4 mg  0.4 mg Sublingual Q5 min PRN Mansy, Jan A, MD       ondansetron  (ZOFRAN ) tablet 4 mg  4 mg Oral Q6H PRN Mansy, Jan A, MD       Or   ondansetron  (ZOFRAN ) injection 4 mg  4 mg Intravenous Q6H PRN Mansy, Jan A, MD       pantoprazole  (PROTONIX ) EC tablet 40 mg  40 mg Oral BID Ricky Fines, MD   40 mg at 08/05/23 9041   tamsulosin  (FLOMAX ) capsule 0.4 mg  0.4 mg Oral QHS Mansy, Jan A, MD   0.4 mg at 08/05/23 0139   traZODone  (DESYREL ) tablet 25 mg  25 mg Oral QHS PRN Mansy, Jan A, MD         ALLERGIES Statins and Wound dressing adhesive  MEDICAL HISTORY Past Medical History:  Diagnosis Date   Abnormal result of other cardiovascular function study 04/21/2021   Abnormality of plasma protein, unspecified 04/21/2021   Acute kidney failure, unspecified (HCC) 04/21/2021   Acute on chronic diastolic (congestive) heart failure (HCC) 04/21/2021   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   Adverse effect of antihyperlipidemic and antiarteriosclerotic drugs, initial encounter 12/19/2017   Benign prostatic hyperplasia with urinary obstruction 04/21/2021   Formatting of this note might be different from the original. Last Assessment & Plan:  Formatting of  this note is different from the original.  - We will continue Flomax .   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   Obesity 04/11/2017   Osteoarthritis 11/11/2008  Other chest pain 04/21/2021   Proteinuria 01/21/2021   Rhinitis, chronic 03/09/2021   Shortness of breath 04/21/2021   Sleep apnea    cpap   Trochanteric bursitis of right hip 04/27/2017   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     SOCIAL HISTORY Social History   Socioeconomic History   Marital status: Married    Spouse name: Not on file   Number of children: Not on file   Years of education: Not on file   Highest education level: Not on file  Occupational History   Not on file  Tobacco Use   Smoking status: Former    Current packs/day: 0.00    Types: Cigarettes    Quit date: 02/01/1990    Years since quitting: 33.5   Smokeless tobacco: Never  Vaping Use   Vaping status: Never Used  Substance and Sexual Activity   Alcohol  use: No   Drug use: No   Sexual activity: Not on file  Other Topics Concern   Not on file  Social History Narrative   Not on file   Social Drivers of Health   Financial Resource Strain: Low Risk  (03/07/2023)   Received from [facility]   Overall Financial Resource Strain (CARDIA)    Difficulty of Paying Living Expenses: Not hard at all  Food Insecurity: No Food Insecurity (08/05/2023)   Hunger Vital Sign    Worried About Running Out of Food in the Last Year:  Never true    Ran Out of Food in the Last Year: Never true  Transportation Needs: No Transportation Needs (08/05/2023)   PRAPARE - Administrator, Civil Service (Medical): No    Lack of Transportation (Non-Medical): No  Physical Activity: Not on file  Stress: No Stress Concern Present (11/10/2021)   Received from [facility] - Occupational Stress Questionnaire    Feeling of Stress : Only a little  Social Connections: Socially Integrated (08/05/2023)   Social Connection and Isolation Panel    Frequency of Communication with Friends and Family: Twice a week    Frequency of Social Gatherings with Friends and Family: Twice a week    Attends Religious Services: More than 4 times per year    Active Member of [employer] or Organizations: No    Attends Engineer, structural: 1 to 4 times per year    Marital Status: Married  Catering manager Violence: Not At Risk (08/05/2023)   Humiliation, Afraid, Rape, and Kick questionnaire    Fear of Current or Ex-Partner: No    Emotionally Abused: No    Physically Abused: No    Sexually Abused: No     FAMILY HISTORY Family History  Problem Relation Age of Onset   Diabetes Mother    Prostate cancer Maternal Grandfather    Colon cancer Neg Hx      Review of Systems: 12 systems were reviewed and negative except per HPI  Physical Exam: Vitals:   08/05/23 1201 08/05/23 1209  BP:  (!) 160/91  Pulse:  74  Resp:  16  Temp: (!) 97.4 F (36.3 C) (!) 97.4 F (36.3 C)  SpO2:     Total I/O In: 240 [P.O.:240] Out: -   Intake/Output Summary (Last 24 hours) at 08/05/2023 1417 Last data filed at 08/05/2023 1321 Gross per 24 hour  Intake 340 ml  Output 800 ml  Net -460 ml   General: well-appearing, no acute distress HEENT:  anicteric sclera, MMM CV: normal rate, no murmurs, no edema Lungs: bilateral chest rise, normal wob Abd: soft, non-tender, non-distended Skin: no visible lesions or  rashes Psych: alert, engaged, appropriate mood and affect Neuro: normal speech, no gross focal deficits   Test Results Reviewed Lab Results  Component Value Date   NA 142 08/05/2023   K 2.8 (L) 08/05/2023   CL 109 08/05/2023   CO2 24 08/05/2023   BUN 52 (H) 08/05/2023   CREATININE 4.15 (H) 08/05/2023   CALCIUM 8.2 (L) 08/05/2023   ALBUMIN 2.5 (L) 06/30/2023   PHOS 2.9 06/30/2023    I have reviewed relevant outside healthcare records

## 2023-08-05 NOTE — Assessment & Plan Note (Signed)
Will continue Zetia.

## 2023-08-05 NOTE — Assessment & Plan Note (Addendum)
-   The patient will be placed on supplemental coverage with NovoLog. - Will continue basal coverage. - Will continue Neurontin.

## 2023-08-05 NOTE — Consult Note (Addendum)
 CARDIOLOGY CONSULT NOTE    Patient ID: Carlos Gill; [id number]; Jun 17, 1940   Admit date: 08/04/2023 Date of Consult: 08/05/2023  Primary Care Provider: [name], MD Primary Cardiologist:  Primary Electrophysiologist:    History of Present Illness:   Mr. 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  Patient reported that he was walking in the grocery store yesterday morning when he noted sudden onset of dizziness or lightheadedness and SOB.  He then arrived to the ER for further evaluation.  When he was in the ER, he had chest tightness substernally for about 10 minutes and resolved.  He did not have any recurrence since then.  Denies palpitations.  He had DOE yesterday but DOE resolved now.  EKG showed NSR, LBBB, frequent PVC and PAC.  He is currently admitted to the hospitalist team for the management of recurrent UTI on IV antibiotics.  He has a history of ESBL UTI.  No symptoms of dysuria but has increased urinary frequency.  No prior ischemic evaluation.  No prior history of MI/PCI/CABG. Hs troponins, 15>> 29>> 26.  Labs remarkable for severe hypokalemia, K2.8.  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 Last Assessment & Plan:  Formatting of this note is different from the original.  - We will continue Flomax .   Bilateral  pseudophakia 04/21/2021   Chronic anemia 08/15/2015   Chronic kidney disease, stage 4 (severe) (HCC) 11/09/2021   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   Diabetes mellitus without complication (HCC)    Enlarged prostate 04/21/2021   Epididymitis 11/09/2021   Essential hypertension 11/02/2021   Fatigue 12/23/2015   GERD without esophagitis 11/02/2021   Hyperglycemia due to diabetes mellitus (HCC) 11/09/2021   Hyperlipidemia    Hypertension    Macular degeneration 04/21/2021   Nonexudative age-related macular degeneration 04/19/2018   Obesity 04/11/2017   Osteoarthritis 11/11/2008   Other chest pain 04/21/2021   Proteinuria 01/21/2021   Rhinitis, chronic 03/09/2021   Shortness of breath 04/21/2021   Sleep apnea    cpap   Trochanteric bursitis of right hip 04/27/2017   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    Past Surgical History:  Procedure Laterality Date   ACHILLES TENDON REPAIR Left 2014   AV FISTULA PLACEMENT Left 08/16/2022   Procedure: LEFT ARM  BRACHIOCEPHALIC ARTERIOVENOUS (AV) FISTULA CREATION;  Surgeon: Carlos Debby SAILOR, MD;  Location: [facility] OR;  Service: Vascular;  Laterality: Left;  with regional block   CATARACT EXTRACTION W/ INTRAOCULAR LENS  IMPLANT, BILATERAL  2011   IR FLUORO GUIDE CV LINE RIGHT  05/30/2023   IR REMOVAL TUN CV CATH W/O FL  06/06/2023   IR US  GUIDE VASC ACCESS RIGHT  05/30/2023       Inpatient Medications: Scheduled Meds:  aspirin  EC  81 mg Oral q morning   calcitRIOL   0.5 mcg Oral BID   carvedilol   3.125 mg Oral BID WC  enoxaparin  (LOVENOX ) injection  30 mg Subcutaneous Q24H   ezetimibe   10 mg Oral Daily   furosemide   40 mg Oral BID   gabapentin   100-200 mg Oral BID   gentamicin  cream  1 Application Topical Daily   hydrALAZINE   100 mg Oral BID   insulin  aspart  0-15 Units Subcutaneous TID WC   insulin  aspart  0-5 Units Subcutaneous QHS   insulin  glargine-yfgn  40 Units Subcutaneous BID   multivitamin-lutein   1  capsule Oral Daily   NIFEdipine   120 mg Oral Daily   pantoprazole   40 mg Oral BID   tamsulosin   0.4 mg Oral QHS   Continuous Infusions:  dialysis solution 1.5% low-MG/low-CA     dialysis solution 2.5% low-MG/low-CA     dialysis solution 4.25% low-MG/low-CA     meropenem  (MERREM ) IV 1 g (08/05/23 1300)   PRN Meds: acetaminophen  **OR** acetaminophen , HYDROmorphone  (DILAUDID ) injection, magnesium  hydroxide, nitroGLYCERIN , ondansetron  **OR** ondansetron  (ZOFRAN ) IV, traZODone   Allergies:    Allergies  Allergen Reactions   Statins Swelling and Other (See Comments)    Muscle aches   Wound Dressing Adhesive Dermatitis    Pt has rash on L arm from tape used with stitches    Social History:   Social History   Socioeconomic History   Marital status: Married    Spouse name: Not on file   Number of children: Not on file   Years of education: Not on file   Highest education level: Not on file  Occupational History   Not on file  Tobacco Use   Smoking status: Former    Current packs/day: 0.00    Types: Cigarettes    Quit date: 02/01/1990    Years since quitting: 33.5   Smokeless tobacco: Never  Vaping Use   Vaping status: Never Used  Substance and Sexual Activity   Alcohol  use: No   Drug use: No   Sexual activity: Not on file  Other Topics Concern   Not on file  Social History Narrative   Not on file   Social Drivers of Health   Financial Resource Strain: Low Risk  (03/07/2023)   Received from [facility]   Overall Financial Resource Strain (CARDIA)    Difficulty of Paying Living Expenses: Not hard at all  Food Insecurity: No Food Insecurity (08/05/2023)   Hunger Vital Sign    Worried About Running Out of Food in the Last Year: Never true    Ran Out of Food in the Last Year: Never true  Transportation Needs: No Transportation Needs (08/05/2023)   PRAPARE - Administrator, Civil Service (Medical): No    Lack of Transportation (Non-Medical): No  Physical  Activity: Not on file  Stress: No Stress Concern Present (11/10/2021)   Received from [facility] - Occupational Stress Questionnaire    Feeling of Stress : Only a little  Social Connections: Socially Integrated (08/05/2023)   Social Connection and Isolation Panel    Frequency of Communication with Friends and Family: Twice a week    Frequency of Social Gatherings with Friends and Family: Twice a week    Attends Religious Services: More than 4 times per year    Active Member of [employer] or Organizations: No    Attends Banker Meetings: 1 to 4 times per year    Marital Status: Married  Catering manager Violence: Not At Risk (08/05/2023)   Humiliation, Afraid, Rape, and Kick questionnaire  Fear of Current or Ex-Partner: No    Emotionally Abused: No    Physically Abused: No    Sexually Abused: No    Family History:    Family History  Problem Relation Age of Onset   Diabetes Mother    Prostate cancer Maternal Grandfather    Colon cancer Neg Hx      ROS:  Please see the history of present illness.  ROS  All other ROS reviewed and negative.     Physical Exam/Data:   Vitals:   08/05/23 1044 08/05/23 1100 08/05/23 1201 08/05/23 1209  BP:  (!) 158/72  (!) 160/91  Pulse:  76  74  Resp:  13  16  Temp: 97.6 F (36.4 C)  (!) 97.4 F (36.3 C) (!) 97.4 F (36.3 C)  TempSrc: Oral  Oral Oral  SpO2:  99%    Weight:        Intake/Output Summary (Last 24 hours) at 08/05/2023 1518 Last data filed at 08/05/2023 1321 Gross per 24 hour  Intake 340 ml  Output 800 ml  Net -460 ml   Filed Weights   08/04/23 1723  Weight: 97.5 kg   Body mass index is 29.16 kg/m.  General:  Well nourished, well developed, in no acute distress HEENT: normal Lymph: no adenopathy Neck: no JVD Endocrine:  No thryomegaly Vascular: No carotid bruits; FA pulses 2+ bilaterally without bruits  Cardiac:  normal S1, S2; RRR; no murmur  Lungs:  clear to  auscultation bilaterally, no wheezing, rhonchi or rales  Abd: soft, nontender, no hepatomegaly  Ext: no edema Musculoskeletal:  No deformities, BUE and BLE strength normal and equal Skin: warm and dry  Neuro:  CNs 2-12 intact, no focal abnormalities noted Psych:  Normal affect   EKG:  The EKG was personally reviewed and demonstrates:   Telemetry:  Telemetry was personally reviewed and demonstrates:    Relevant CV Studies:   Laboratory Data:  Chemistry Recent Labs  Lab 08/04/23 1730 08/05/23 0456  NA 137 142  K 2.9* 2.8*  CL 103 109  CO2 20* 24  GLUCOSE 188* 102*  BUN 55* 52*  CREATININE 4.73* 4.15*  CALCIUM 8.4* 8.2*  GFRNONAA 12* 14*  ANIONGAP 14 9    No results for input(s): PROT, ALBUMIN, AST, ALT, ALKPHOS, BILITOT in the last 168 hours. Hematology Recent Labs  Lab 08/04/23 1730 08/05/23 0456  WBC 4.7 5.1  RBC 3.99* 4.39  HGB 11.3* 11.8*  HCT 34.2* 38.2*  MCV 85.7 87.0  MCH 28.3 26.9  MCHC 33.0 30.9  RDW 16.5* 16.3*  PLT 243 233   Cardiac EnzymesNo results for input(s): TROPONINI in the last 168 hours. No results for input(s): TROPIPOC in the last 168 hours.  BNPNo results for input(s): BNP, PROBNP in the last 168 hours.  DDimer No results for input(s): DDIMER in the last 168 hours.  Radiology/Studies:  DG Chest Port 1 View Result Date: 08/04/2023 CLINICAL DATA:  Weakness. EXAM: PORTABLE CHEST 1 VIEW COMPARISON:  06/24/2023 FINDINGS: Stable cardiomegaly. Mediastinal contours are unchanged. Mild bibasilar atelectasis. No pulmonary edema. No pleural fluid. No pneumothorax. Grossly stable osseous structures. IMPRESSION: Stable cardiomegaly. Mild bibasilar atelectasis. Electronically Signed   By: Andrea Gasman M.D.   On: 08/04/2023 18:59    Assessment and Plan:   Chest pain: Patient had 1 episode of substernal chest tightness while walking in the ER yesterday.  Lasted for 10 minutes.  Did not have any recurrences.  Not have any prior  episodes  of chest pain either.  EKG showed NSR, LBBB, frequent PVC and PAC. Hs troponins, 15>> 29>> 26. No further cardiac workup at this time.  If he develops any recurrent exertional chest pains, he will benefit from ischemia evaluation.  Dizziness/lightheadedness: Multifactorial, sepsis versus frequent PVCs.  Telemetry reviewed, has NSR, LBBB, frequent PVCs and PAC.  Labs remarkable for severe hypokalemia, K2.8. Keep K>4 and <5, Mg>2 and <3.  Obtain 2-week event monitor upon discharge.  Obtain echocardiogram.  Frequent PVCs/PACs: Likely secondary to severe hypokalemia. Keep K>4 and <5, Mg>2 and <3.  Obtain 2-week event monitor upon discharge. Obtain echocardiogram.  Severe hypokalemia, K2.8: Replete potassium.  Keep K>4 and <5, Mg>2 and <3.  Hold p.o. Lasix  until serum potassium is more than 3.5.  Likely secondary to Lasix .  No evidence of adrenal nodule on CT scan.   For questions or updates, please contact [facility] Please consult [email or web address] for contact info under Cardiology/STEMI.   Signed, Batya Citron Priya Jordane Hisle, MD 08/05/2023 3:18 PM

## 2023-08-05 NOTE — Progress Notes (Signed)
 Progress Note   Patient: Carlos Gill FMW:[id number] DOB: Mar 18, 1940 DOA: 08/04/2023     0 DOS: the patient was seen and examined on 08/05/2023   Brief hospital admission narrative: As per H&P written by Dr. Lawence on 08/05/2023 Carlos Gill is a 83 y.o. African-American male with medical history significant for type 2 diabetes mellitus, end-stage renal disease on peritoneal dialysis, hypertension, macular degeneration, chronic diastolic CHF, and GERD, who presented to the emergency room because of lightheadedness and dizziness that started earlier during the day without presyncope or syncope, paresthesias or focal muscle weakness.  Experienced chest pain on exertion upon ambulation in the ER without radiation,, moderate intensity with no nausea or vomiting or diaphoresis.  He related to urinary frequency and urgency without dysuria or hematuria or flank pain.  No dyspnea or cough or wheezing.   ED Course: Upon presentation to the emergency room, BP was 159/78 with otherwise normal vital signs.  Labs revealed a previous 2.9 SGOT of 20 with blood glucose of 188 and BUN 55 creatinine 4.73 and calcium of 8.4.  High sensitive troponin I was 15, later 29 and 26.  CBC showed mild anemia hemoglobin 11.3 hematocrit 34.2.  UA was positive for UTI urine culture was sent. EKG as reviewed by me :  EKG showed sinus rhythm rate of 92 with PACs, nonspecific IVCD with LAD and LVH.. Imaging: Portable chest x-ray showed stable cardiomegaly with mild bibasilar atelectasis.   The patient was given 40 mg of p.o. potassium chloride  and 1 g of IV meropenem .  Contact was made with Dr. Dennise with nephrology.  The patient will be admitted to a cardiac telemetry observation bed at [facility] for further evaluation and management.  Assessment and plan Chest pain -Atypical with typical features - Heart score 4 - Follow troponin and serial EKG - Continue aspirin , Coreg  and as needed nitroglycerin  - Recent 2D echo demonstrating no wall  motion abnormalities and preserved ejection fraction.  There was no significant valvular disorders of vegetation. - Cardiology service evaluation and further stratification workup decisions to be made after his arrival to [facility]. -Recent A1c 8.6; will check lipid panel. -Chest pain-free at time of evaluation. - Continue PPI.  ESRD on peritoneal dialysis [facility]) -Nephrology service has been notified and recommended transfer to [facility] for continuation of peritoneal dialysis. - Patient normally receives dialysis Monday, Wednesday, Friday and Saturday.  Last treatment 08-03-23 - No nausea/vomiting and reports no abdominal pain.  Acute lower UTI -Patient with prior history of ESBL UTI - Continue IV meropenem  empirically - Follow culture result - Advised to maintain adequate hydration.  Dyslipidemia -Continue Zetia  -Update lipid panel. - Heart healthy/low-fat diet discussed with patient.  Essential hypertension -Will continue treatment with carvedilol  and nifedipine  - Heart healthy/low-sodium diet discussed with patient. - As needed hydralazine  also ordered.  Type 2 diabetes mellitus with peripheral neuropathy (HCC) -Continue sliding scale insulin  and Semglee  - Follow CBG fluctuation - Continue Neurontin .  GERD without esophagitis -Continue PPI. - Dose adjusted to twice a day for better symptoms control strategy.   BPH (benign prostatic hyperplasia) -Continue treatment with Flomax  - Patient denying urinary retention symptoms currently.  Hypokalemia - Replete electrolytes and follow trend - Telemetry monitoring ordered - Will check magnesium  level.  Subjective:  Chest pain-free; no nausea, no vomiting, no shortness of breath.  Reports increased frequency and prior to admission experiencing some dizziness sensation.  Currently afebrile and in no acute distress.  Physical Exam: Vitals:  08/05/23 0600 08/05/23 0630 08/05/23 0752 08/05/23 0800  BP: (!)  171/91 (!) 168/75 (!) 152/81 (!) 169/85  Pulse:  84 80 84  Resp: 11 16  16   Temp:  98.2 F (36.8 C)    TempSrc:  Oral    SpO2:  98%  100%  Weight:       General exam: Alert, awake, oriented x 3; in no major distress. Respiratory system: Good saturation on room air; no using accessory muscles. Cardiovascular system:RRR. No murmurs, rubs or gallops. Gastrointestinal system: Abdomen is nondistended, soft and without guarding.  Positive bowel sounds.  Peritoneal dialysis catheter in place; no surrounding erythema or active drainage. Central nervous system: In 4 limbs are spontaneous.  No focal neurological deficits. Extremities: No cyanosis or clubbing; trace edema appreciated bilaterally. Skin: No petechiae. Psychiatry: Judgement and insight appear normal. Mood & affect appropriate.   Data Reviewed: Basic metabolic panel: Sodium 142, potassium 2.8, chloride 109, bicarb 24, BUN 52, creatinine 4.15 and GFR 14 CBC: WBCs 5.1, hemoglobin 11.8 and platelet count 233K  Family Communication: No family at bedside.  Disposition: Status is: Observation The patient will require care spanning > 2 midnights and should be moved to inpatient because: Continue IV therapy  Anticipating discharge back home once medically stable. Time spent: 50 minutes  Author: Eric Nunnery, MD 08/05/2023 8:20 AM  For on call review [email or web address].

## 2023-08-05 NOTE — Assessment & Plan Note (Signed)
-   The patient will be admitted to an observation cardiac telemetry bed at [facility]. - Will follow serial troponins and EKGs. - The patient will be placed on aspirin  as well as p.r.n. sublingual nitroglycerin  and morphine  sulfate for pain. - Cardiology consult can be called in a.m.

## 2023-08-05 NOTE — Progress Notes (Signed)
   08/05/23 0746  TOC Brief Assessment  Insurance and Status Reviewed Financial risk analyst)  Patient has primary care physician Yes (ARONSON, RICHARD)  Home environment has been reviewed From home with spouse  Prior level of function: Independent  Prior/Current Home Services No current home services  Social Drivers of Health Review SDOH reviewed no interventions necessary  Readmission risk has been reviewed Yes  Transition of care needs no transition of care needs at this time      Transition of Care Department [facility]) has reviewed patient and no TOC needs have been identified at this time. We will continue to monitor patient advancement through interdisciplinary progression rounds. If new patient transition needs arise, please place a TOC consult.

## 2023-08-06 ENCOUNTER — Inpatient Hospital Stay (HOSPITAL_COMMUNITY)

## 2023-08-06 DIAGNOSIS — R079 Chest pain, unspecified: Secondary | ICD-10-CM | POA: Diagnosis not present

## 2023-08-06 DIAGNOSIS — I493 Ventricular premature depolarization: Secondary | ICD-10-CM | POA: Diagnosis not present

## 2023-08-06 DIAGNOSIS — R42 Dizziness and giddiness: Principal | ICD-10-CM

## 2023-08-06 DIAGNOSIS — I10 Essential (primary) hypertension: Secondary | ICD-10-CM | POA: Diagnosis not present

## 2023-08-06 DIAGNOSIS — N186 End stage renal disease: Secondary | ICD-10-CM | POA: Diagnosis not present

## 2023-08-06 DIAGNOSIS — N390 Urinary tract infection, site not specified: Secondary | ICD-10-CM | POA: Diagnosis not present

## 2023-08-06 DIAGNOSIS — E876 Hypokalemia: Secondary | ICD-10-CM

## 2023-08-06 LAB — CBC
HCT: 38.8 % — ABNORMAL LOW (ref 39.0–52.0)
Hemoglobin: 12.4 g/dL — ABNORMAL LOW (ref 13.0–17.0)
MCH: 27.5 pg (ref 26.0–34.0)
MCHC: 32 g/dL (ref 30.0–36.0)
MCV: 86 fL (ref 80.0–100.0)
Platelets: 237 10*3/uL (ref 150–400)
RBC: 4.51 MIL/uL (ref 4.22–5.81)
RDW: 16.7 % — ABNORMAL HIGH (ref 11.5–15.5)
WBC: 5.2 10*3/uL (ref 4.0–10.5)
nRBC: 0 % (ref 0.0–0.2)

## 2023-08-06 LAB — RENAL FUNCTION PANEL
Albumin: 3 g/dL — ABNORMAL LOW (ref 3.5–5.0)
Albumin: 2.7 g/dL — ABNORMAL LOW (ref 3.5–5.0)
Anion gap: 9 (ref 5–15)
Anion gap: 12 (ref 5–15)
BUN: 49 mg/dL — ABNORMAL HIGH (ref 8–23)
BUN: 51 mg/dL — ABNORMAL HIGH (ref 8–23)
CO2: 22 mmol/L (ref 22–32)
CO2: 22 mmol/L (ref 22–32)
Calcium: 8.9 mg/dL (ref 8.9–10.3)
Calcium: 9.1 mg/dL (ref 8.9–10.3)
Chloride: 108 mmol/L (ref 98–111)
Chloride: 107 mmol/L (ref 98–111)
Creatinine, Ser: 4.19 mg/dL — ABNORMAL HIGH (ref 0.61–1.24)
Creatinine, Ser: 4.54 mg/dL — ABNORMAL HIGH (ref 0.61–1.24)
GFR, Estimated: 13 mL/min — ABNORMAL LOW
GFR, Estimated: 12 mL/min — ABNORMAL LOW (ref >60)
Glucose, Bld: 97 mg/dL (ref 70–99)
Glucose, Bld: 181 mg/dL — ABNORMAL HIGH (ref 70–99)
Phosphorus: 3.1 mg/dL (ref 2.5–4.6)
Phosphorus: 3.3 mg/dL (ref 2.5–4.6)
Potassium: 3.2 mmol/L — ABNORMAL LOW (ref 3.5–5.1)
Potassium: 4.2 mmol/L (ref 3.5–5.1)
Sodium: 139 mmol/L (ref 135–145)
Sodium: 141 mmol/L (ref 135–145)

## 2023-08-06 LAB — GLUCOSE, CAPILLARY
Glucose-Capillary: 65 mg/dL — ABNORMAL LOW (ref 70–99)
Glucose-Capillary: 94 mg/dL (ref 70–99)
Glucose-Capillary: 68 mg/dL — ABNORMAL LOW (ref 70–99)
Glucose-Capillary: 121 mg/dL — ABNORMAL HIGH (ref 70–99)
Glucose-Capillary: 199 mg/dL — ABNORMAL HIGH (ref 70–99)
Glucose-Capillary: 113 mg/dL — ABNORMAL HIGH (ref 70–99)

## 2023-08-06 LAB — ECHOCARDIOGRAM COMPLETE
AR max vel: 2.56 cm2
AV Area VTI: 2.59 cm2
AV Area mean vel: 2.42 cm2
AV Mean grad: 7 mmHg
AV Peak grad: 10.8 mmHg
Ao pk vel: 1.64 m/s
Area-P 1/2: 3.63 cm2
S' Lateral: 3.2 cm
Weight: 3372.16 [oz_av]

## 2023-08-06 LAB — MAGNESIUM: Magnesium: 1.7 mg/dL (ref 1.7–2.4)

## 2023-08-06 MED ORDER — MAGNESIUM SULFATE 50 % IJ SOLN: 2.0000 g | Freq: Once | INTRAVENOUS | Status: DC

## 2023-08-06 MED ORDER — INSULIN GLARGINE-YFGN 100 UNIT/ML ~~LOC~~ SOLN
30.0000 [IU] | Freq: Two times a day (BID) | SUBCUTANEOUS | Status: DC
  Administered 2023-08-06 – 2023-08-07 (×3): 30 [IU] via SUBCUTANEOUS
  Filled 2023-08-06 (×4): qty 0.3

## 2023-08-06 MED ORDER — MAGNESIUM SULFATE 2 GM/50ML IV SOLN
2.0000 g | Freq: Once | INTRAVENOUS | Status: AC
  Administered 2023-08-06: 2 g via INTRAVENOUS
  Filled 2023-08-06: qty 50

## 2023-08-06 MED ORDER — POTASSIUM CHLORIDE 20 MEQ PO PACK
40.0000 meq | PACK | Freq: Once | ORAL | Status: AC
  Administered 2023-08-06: 40 meq via ORAL
  Filled 2023-08-06: qty 2

## 2023-08-06 MED ORDER — PROSIGHT PO TABS
1.0000 | ORAL_TABLET | Freq: Every day | ORAL | Status: DC
  Administered 2023-08-07 – 2023-08-08 (×2): 1 via ORAL
  Filled 2023-08-06 (×2): qty 1

## 2023-08-06 MED ORDER — INSULIN ASPART 100 UNIT/ML IJ SOLN
0.0000 [IU] | Freq: Three times a day (TID) | INTRAMUSCULAR | Status: DC
  Administered 2023-08-06 – 2023-08-07 (×2): 2 [IU] via SUBCUTANEOUS

## 2023-08-06 MED ORDER — POTASSIUM CHLORIDE CRYS ER 20 MEQ PO TBCR
40.0000 meq | EXTENDED_RELEASE_TABLET | Freq: Once | ORAL | Status: AC
  Administered 2023-08-06: 40 meq via ORAL
  Filled 2023-08-06: qty 2

## 2023-08-06 NOTE — Progress Notes (Signed)
 Rounding Note   Patient Name: Carlos Gill Date of Encounter: 08/06/2023  [facility] Cardiologist: None   Subjective BP 129/59.  Potassium 3.2, magnesium  1.7.  He denies any chest pain, dyspnea, or lightheadedness this morning  Scheduled Meds:  aspirin  EC  81 mg Oral q morning   calcitRIOL   0.5 mcg Oral BID   carvedilol   3.125 mg Oral BID WC   enoxaparin  (LOVENOX ) injection  30 mg Subcutaneous Q24H   ezetimibe   10 mg Oral Daily   furosemide   40 mg Oral BID   gabapentin   100-200 mg Oral BID   gentamicin  cream  1 Application Topical Daily   hydrALAZINE   100 mg Oral BID   insulin  aspart  0-5 Units Subcutaneous QHS   insulin  aspart  0-9 Units Subcutaneous TID WC   insulin  glargine-yfgn  30 Units Subcutaneous BID   multivitamin-lutein   1 capsule Oral Daily   NIFEdipine   120 mg Oral Daily   pantoprazole   40 mg Oral BID   potassium chloride   40 mEq Oral Once   tamsulosin   0.4 mg Oral QHS   Continuous Infusions:  dialysis solution 1.5% low-MG/low-CA     dialysis solution 2.5% low-MG/low-CA     dialysis solution 4.25% low-MG/low-CA     meropenem  (MERREM ) IV 1 g (08/06/23 1157)   PRN Meds: acetaminophen  **OR** acetaminophen , HYDROmorphone  (DILAUDID ) injection, magnesium  hydroxide, nitroGLYCERIN , ondansetron  **OR** ondansetron  (ZOFRAN ) IV, traZODone    Vital Signs  Vitals:   08/06/23 0741 08/06/23 0947 08/06/23 0951 08/06/23 1154  BP:  (!) 159/143 (!) 159/143 (!) 129/59  Pulse:  (!) 101 (!) 101 92  Resp: 16  16 16   Temp: 97.9 F (36.6 C)   98.7 F (37.1 C)  TempSrc: Oral   Oral  SpO2:   97% 99%  Weight:        Intake/Output Summary (Last 24 hours) at 08/06/2023 1251 Last data filed at 08/06/2023 0743 Gross per 24 hour  Intake 588.98 ml  Output 100 ml  Net 488.98 ml      08/05/2023    8:12 PM 08/04/2023    5:23 PM 07/01/2023    5:00 AM  Last 3 Weights  Weight (lbs) 210 lb 12.2 oz 215 lb 220 lb 6.4 oz  Weight (kg) 95.6 kg 97.523 kg 99.973 kg       Telemetry Sinus rhythm with PACs/PVCs- Personally Reviewed  ECG  No new- Personally Reviewed  Physical Exam  GEN: No acute distress.   Neck: No JVD Cardiac: Irregular, normal rate, no murmurs, rubs, or gallops.  Respiratory: Clear to auscultation bilaterally. GI: Soft, nontender, non-distended  MS: No edema; No deformity. Neuro:  Nonfocal  Psych: Normal affect   Labs High Sensitivity Troponin:   Recent Labs  Lab 08/04/23 1730 08/04/23 1949 08/04/23 2154  TROPONINIHS 15 29* 26*     Chemistry Recent Labs  Lab 08/05/23 0456 08/05/23 1548 08/06/23 0228  NA 142 141 139  K 2.8* 3.8 3.2*  CL 109 107 108  CO2 24 22 22   GLUCOSE 102* 257* 97  BUN 52* 48* 49*  CREATININE 4.15* 3.90* 4.19*  CALCIUM 8.2* 8.9 8.9  MG  --   --  1.7  ALBUMIN  --   --  3.0*  GFRNONAA 14* 15* 13*  ANIONGAP 9 12 9     Lipids  Recent Labs  Lab 08/05/23 0456  CHOL 181  TRIG 105  HDL 40*  LDLCALC 120*  CHOLHDL 4.5    Hematology Recent Labs  Lab  08/04/23 1730 08/05/23 0456 08/06/23 0228  WBC 4.7 5.1 5.2  RBC 3.99* 4.39 4.51  HGB 11.3* 11.8* 12.4*  HCT 34.2* 38.2* 38.8*  MCV 85.7 87.0 86.0  MCH 28.3 26.9 27.5  MCHC 33.0 30.9 32.0  RDW 16.5* 16.3* 16.7*  PLT 243 233 237   Thyroid No results for input(s): TSH, FREET4 in the last 168 hours.  BNPNo results for input(s): BNP, PROBNP in the last 168 hours.  DDimer No results for input(s): DDIMER in the last 168 hours.   Radiology  DG Chest Port 1 View Result Date: 08/04/2023 CLINICAL DATA:  Weakness. EXAM: PORTABLE CHEST 1 VIEW COMPARISON:  06/24/2023 FINDINGS: Stable cardiomegaly. Mediastinal contours are unchanged. Mild bibasilar atelectasis. No pulmonary edema. No pleural fluid. No pneumothorax. Grossly stable osseous structures. IMPRESSION: Stable cardiomegaly. Mild bibasilar atelectasis. Electronically Signed   By: Andrea Gasman M.D.   On: 08/04/2023 18:59    Cardiac Studies   Patient Profile   83 y.o. 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   Assessment & Plan  Chest pain: Patient had 1 episode of substernal chest tightness while walking in the ER.  Lasted for 10 minutes.  Did not have any recurrences.  Not have any prior episodes of chest pain either.  EKG showed NSR, LBBB, frequent PVC and PAC. Hs troponins, 15>> 29>> 26. No further cardiac workup at this time.  Can plan outpatient ischemic evaluation with stress PET   Dizziness/lightheadedness: Multifactorial, sepsis versus frequent PVCs.  Telemetry reviewed, has NSR, LBBB, frequent PVCs and PAC.  Labs remarkable for severe hypokalemia, K2.8. Keep K>4 and <5, Mg>2 and <3.  Obtain 2-week event monitor upon discharge.  Will follow-up echocardiogram   Frequent PVCs/PACs: Likely secondary to severe hypokalemia. Keep K>4 and <5, Mg>2 and <3.  Obtain 2-week event monitor upon discharge. Obtain echocardiogram.   Severe hypokalemia, K2.8: Replete potassium.  Keep K>4 and <5, Mg>2 and <3.  Hold p.o. Lasix  until serum potassium is more than 3.5.  Likely secondary to Lasix .  No evidence of adrenal nodule on CT scan.  For questions or updates, please contact [facility] Please consult [email or web address] for contact info under     Signed, Lonni LITTIE Nanas, MD  08/06/2023, 12:51 PM

## 2023-08-06 NOTE — Progress Notes (Signed)
 [facility] Progress Note   Subjective: In room up in chair. Denies SOB. BP stable   Objective Vitals:   08/05/23 2012 08/06/23 0009 08/06/23 0544 08/06/23 0741  BP:  (!) 130/54 (!) 120/49   Pulse:  86 97   Resp:  16 20 16   Temp:  98 F (36.7 C) 98.3 F (36.8 C) 97.9 F (36.6 C)  TempSrc:  Oral Oral Oral  SpO2:  99% 98%   Weight: 95.6 kg        Additional Objective Labs: Basic Metabolic Panel: Recent Labs  Lab 08/05/23 0456 08/05/23 1548 08/06/23 0228  NA 142 141 139  K 2.8* 3.8 3.2*  CL 109 107 108  CO2 24 22 22   GLUCOSE 102* 257* 97  BUN 52* 48* 49*  CREATININE 4.15* 3.90* 4.19*  CALCIUM 8.2* 8.9 8.9  PHOS  --  3.1 3.1   Liver Function Tests: Recent Labs  Lab 08/06/23 0228  ALBUMIN 3.0*   No results for input(s): LIPASE, AMYLASE in the last 168 hours. CBC: Recent Labs  Lab 08/04/23 1730 08/05/23 0456 08/06/23 0228  WBC 4.7 5.1 5.2  NEUTROABS 2.6  --   --   HGB 11.3* 11.8* 12.4*  HCT 34.2* 38.2* 38.8*  MCV 85.7 87.0 86.0  PLT 243 233 237   Blood Culture    Component Value Date/Time   SDES BLOOD RIGHT HAND 06/26/2023 1430   SPECREQUEST  06/26/2023 1430    BOTTLES DRAWN AEROBIC AND ANAEROBIC Blood Culture adequate volume   CULT  06/26/2023 1430    NO GROWTH 5 DAYS Performed at [facility], [address] [place], [place] [address]    REPTSTATUS 07/01/2023 FINAL 06/26/2023 1430    Cardiac Enzymes: No results for input(s): CKTOTAL, CKMB, CKMBINDEX, TROPONINI in the last 168 hours. CBG: Recent Labs  Lab 08/05/23 1648 08/05/23 2105 08/06/23 0546 08/06/23 0612 08/06/23 0735  GLUCAP 220* 121* 65* 94 68*   Iron Studies: No results for input(s): IRON, TIBC, TRANSFERRIN, FERRITIN in the last 72 hours. @lablastinr3 @ Studies/Results: DG Chest Port 1 View Result Date: 08/04/2023 CLINICAL DATA:  Weakness. EXAM: PORTABLE CHEST 1 VIEW COMPARISON:  06/24/2023 FINDINGS: Stable cardiomegaly. Mediastinal  contours are unchanged. Mild bibasilar atelectasis. No pulmonary edema. No pleural fluid. No pneumothorax. Grossly stable osseous structures. IMPRESSION: Stable cardiomegaly. Mild bibasilar atelectasis. Electronically Signed   By: Andrea Gasman M.D.   On: 08/04/2023 18:59   Medications:  dialysis solution 1.5% low-MG/low-CA     dialysis solution 2.5% low-MG/low-CA     dialysis solution 4.25% low-MG/low-CA     meropenem  (MERREM ) IV 1 g (08/06/23 0008)    aspirin  EC  81 mg Oral q morning   calcitRIOL   0.5 mcg Oral BID   carvedilol   3.125 mg Oral BID WC   enoxaparin  (LOVENOX ) injection  30 mg Subcutaneous Q24H   ezetimibe   10 mg Oral Daily   furosemide   40 mg Oral BID   gabapentin   100-200 mg Oral BID   gentamicin  cream  1 Application Topical Daily   hydrALAZINE   100 mg Oral BID   insulin  aspart  0-5 Units Subcutaneous QHS   insulin  aspart  0-9 Units Subcutaneous TID WC   insulin  glargine-yfgn  30 Units Subcutaneous BID   multivitamin-lutein   1 capsule Oral Daily   NIFEdipine   120 mg Oral Daily   pantoprazole   40 mg Oral BID   potassium chloride   40 mEq Oral Once   tamsulosin   0.4 mg Oral QHS  Physical Exam: General: Chronically ill appearing elderly male in NAD CV: S1,S2 No M/R/G PULM: CTAB A/P GI/GI: NABs, NT  LE: No LLE edema Trace R ankle edema. HD Access: PD catheter mid abdomen drsg intact  Outpatient dialysis unit: [facility] dialysis prescription:      4 cycles, 2.5 L fill volume, usually uses 2.5% fluids  Usual wt ~ 97kg   Assessment/Recommendations: Carlos Gill is a/an 83 y.o. male with a past medical history notable for ESRD on HD admitted with UTI.   # Chest pain: Cards consulted. Troponins low, flat. Per primary.    # ESRD: Continue PD nightly with his typical prescription  # Hypokalemia: K+ 3.2 Given 40 meq KCL PO this AM.. Having frequent MF PVCs. Repeat dose at 2 PM. Recheck RFP at 1600.    # Volume/ hypertension: Appears euvolemic.   Maintain volume status with peritoneal dialysis.  Continue home blood pressure medications. On home diuretics   # Anemia of Chronic Kidney Disease: Hemoglobin at goal without ESA   # Secondary Hyperparathyroidism/Hyperphosphatemia: Continue home calcitriol .  Obtain phosphorus level   # Hypokalemia: Repeat level now.  Replace as needed   # UTI: History of ESBL E. coli.  Antibiotics per primary team.  Symptoms were very severe.  No dysuria and was having urinary frequency.  Given recurrent nature and also questionable symptoms may want to involve urology and/or infectious disease to determine if further antibiotics are needed.  Ongoing resistance would be a concern     # Additional recommendations: - Dose all meds for creatinine clearance < 10 ml/min  - Unless absolutely necessary, no MRIs with gadolinium.  - Implement save arm precautions.  Prefer needle sticks in the dorsum of the hands or wrists.  No blood pressure measurements in arm. - If blood transfusion is requested during hemodialysis sessions, please alert us  prior to the session.  - Use synthetic opioids (Fentanyl /Dilaudid ) if needed  Paidyn Mcferran H. Kathe Wirick NP-C 08/06/2023, 9:46 AM  [employer] [phone]

## 2023-08-06 NOTE — Progress Notes (Signed)
 [facility]                                                                              Carlos Gill, is a 83 y.o. male, DOB - 06-16-1940, FMW:[id number] Admit date - 08/04/2023    Outpatient Primary MD for the patient is [name], MD  LOS - 1  days  Chief Complaint  Patient presents with   Dizziness       Brief summary   Patient is a 83 year old male with DM type II, ESRD on peritoneal dialysis, HTN, macular degeneration, chronic diastolic CHF, GERD presented to ED with dizziness, lightheadedness that started earlier during the day of admission.  Patient reported chest pain on exertion and upon ambulation in the ER, no radiation, no nausea vomiting or diaphoresis.  Reported urinary frequency, urgency, no dysuria, hematuria or flank pain.  No dyspnea or cough or wheezing.  In ED, creatinine 4.73, troponin 15-> 29-> 26, chest x-ray showed stable cardiomegaly with mild bibasilar atelectasis.  UA positive for UTI Patient was given 1 g IV meropenem , nephrology was consulted and recommended transfer to [facility] for further evaluation given peritoneal dialysis.  Assessment & Plan    Chest pain -Atypical, 1 episode of substernal chest tightness while ambulating, lasted about 10 minutes, no further recurrence. -Seen by cardiology, troponins have improved, recommended no further cardiac workup at this time.  If he develops any recurrent exertional chest pain, will benefit from ischemia evaluation - Chest pain has currently resolved - 2D echo 06/2023 that showed EF of 50 to 55%, paradoxical septal motion consistent with LBBB, G1DD  Dizziness, lightheadedness - Possibly from UTI, sepsis, PVCs - Seen by cardiology, recommended to correct electrolytes, 2-week event monitor upon discharge, obtain 2D echo  Frequent PVCs, PACs -Likely due to severe hypokalemia, recommended 2-week event monitor upon discharge, 2D echo    ESRD on peritoneal dialysis [facility]) -Nephrology consulted  and following  - Management per renal, on home diuretics   Acute lower UTI -Patient with prior history of ESBL UTI -Continue IV meropenem , follow urine culture.  Pending results, will discuss with ID regarding antibiotics.  Patient may follow-up with urology outpatient.   Dyslipidemia -Continue Zetia  -Lipid panel showed HDL 40, LDL 120    Essential hypertension -Continue Coreg , nifedipine   - Continue IV hydralazine  as needed with parameters   Type 2 diabetes mellitus with peripheral neuropathy (HCC) -Hemoglobin A1c 8.6 on 06/27/2023 -CBGs low this morning -Decrease sliding scale insulin  to sensitive, decrease Semglee  to 30 units twice daily (at home on 40 units twice daily) CBG (last 3)  Recent Labs    08/06/23 0546 08/06/23 0612 08/06/23 0735  GLUCAP 65* 94 68*      GERD without esophagitis -Continue PPI.    BPH (benign prostatic hyperplasia) -Continue treatment with Flomax     Hypokalemia - Replaced  Estimated body mass index is 28.58 kg/m as calculated from the following:   Height as of 06/24/23: 6' (1.829 m).   Weight as of this encounter: 95.6 kg.  Code Status: Full code DVT Prophylaxis:  enoxaparin  (LOVENOX ) injection 30 mg Start: 08/05/23 1000   Level of Care: Level  of care: Telemetry Cardiac Family Communication: Updated patient Disposition Plan:      Remains inpatient appropriate: Awaiting urine culture and sensitivities, 2D echo   Procedures:    Consultants:   Cardiology Nephrology  Antimicrobials:   Anti-infectives (From admission, onward)    Start     Dose/Rate Route Frequency Ordered Stop   08/05/23 1200  meropenem  (MERREM ) 1 g in sodium chloride  0.9 % 100 mL IVPB        1 g 200 mL/hr over 30 Minutes Intravenous Every 12 hours 08/05/23 0218     08/05/23 0600  meropenem  (MERREM ) 1 g in sodium chloride  0.9 % 100 mL IVPB  Status:  Discontinued        1 g 200 mL/hr over 30 Minutes Intravenous Every 8 hours 08/05/23 0208 08/05/23 0216    08/05/23 0015  meropenem  (MERREM ) 1 g in sodium chloride  0.9 % 100 mL IVPB        1 g 200 mL/hr over 30 Minutes Intravenous  Once 08/05/23 0002 08/05/23 0116   08/04/23 2330  cephALEXin  (KEFLEX ) capsule 250 mg  Status:  Discontinued        250 mg Oral  Once 08/04/23 2318 08/04/23 2333   08/04/23 0000  cephALEXin  (KEFLEX ) 250 MG capsule        250 mg Oral 2 times daily 08/04/23 2321            Medications  aspirin  EC  81 mg Oral q morning   calcitRIOL   0.5 mcg Oral BID   carvedilol   3.125 mg Oral BID WC   enoxaparin  (LOVENOX ) injection  30 mg Subcutaneous Q24H   ezetimibe   10 mg Oral Daily   furosemide   40 mg Oral BID   gabapentin   100-200 mg Oral BID   gentamicin  cream  1 Application Topical Daily   hydrALAZINE   100 mg Oral BID   insulin  aspart  0-5 Units Subcutaneous QHS   insulin  aspart  0-9 Units Subcutaneous TID WC   insulin  glargine-yfgn  30 Units Subcutaneous BID   multivitamin-lutein   1 capsule Oral Daily   NIFEdipine   120 mg Oral Daily   pantoprazole   40 mg Oral BID   potassium chloride   40 mEq Oral Once   tamsulosin   0.4 mg Oral QHS      Subjective:   Carlos Gill was seen and examined today.  No further chest pain, shortness of breath, fevers or chills.  Feeling better.  Patient denies dizziness, abdominal pain, N/V/D/C, new weakness, numbess, tingling. No acute events overnight.    Objective:   Vitals:   08/05/23 2012 08/06/23 0009 08/06/23 0544 08/06/23 0741  BP:  (!) 130/54 (!) 120/49   Pulse:  86 97   Resp:  16 20 16   Temp:  98 F (36.7 C) 98.3 F (36.8 C) 97.9 F (36.6 C)  TempSrc:  Oral Oral Oral  SpO2:  99% 98%   Weight: 95.6 kg       Intake/Output Summary (Last 24 hours) at 08/06/2023 0942 Last data filed at 08/06/2023 0743 Gross per 24 hour  Intake 588.98 ml  Output 100 ml  Net 488.98 ml     Wt Readings from Last 3 Encounters:  08/05/23 95.6 kg  07/01/23 100 kg  06/08/23 98.4 kg     Exam General: Alert and oriented x 3,  NAD Cardiovascular: S1 S2 auscultated,  RRR Respiratory: Clear to auscultation bilaterally, no wheezing Gastrointestinal: Soft, nontender, nondistended, + bowel sounds.  PD cath + Ext: no  pedal edema bilaterally Neuro: no new deficits Psych: Normal affect     Data Reviewed:  I have personally reviewed following labs    CBC Lab Results  Component Value Date   WBC 5.2 08/06/2023   RBC 4.51 08/06/2023   HGB 12.4 (L) 08/06/2023   HCT 38.8 (L) 08/06/2023   MCV 86.0 08/06/2023   MCH 27.5 08/06/2023   PLT 237 08/06/2023   MCHC 32.0 08/06/2023   RDW 16.7 (H) 08/06/2023   LYMPHSABS 1.5 08/04/2023   MONOABS 0.5 08/04/2023   EOSABS 0.2 08/04/2023   BASOSABS 0.0 08/04/2023     Last metabolic panel Lab Results  Component Value Date   NA 139 08/06/2023   K 3.2 (L) 08/06/2023   CL 108 08/06/2023   CO2 22 08/06/2023   BUN 49 (H) 08/06/2023   CREATININE 4.19 (H) 08/06/2023   GLUCOSE 97 08/06/2023   GFRNONAA 13 (L) 08/06/2023   GFRAA 42 (L) 05/02/2016   CALCIUM 8.9 08/06/2023   PHOS 3.1 08/06/2023   PROT 7.0 05/26/2023   ALBUMIN 3.0 (L) 08/06/2023   BILITOT 0.8 05/26/2023   ALKPHOS 76 05/26/2023   AST 22 05/26/2023   ALT 26 05/26/2023   ANIONGAP 9 08/06/2023    CBG (last 3)  Recent Labs    08/06/23 0546 08/06/23 0612 08/06/23 0735  GLUCAP 65* 94 68*      Coagulation Profile: No results for input(s): INR, PROTIME in the last 168 hours.   Radiology Studies: I have personally reviewed the imaging studies  DG Chest Port 1 View Result Date: 08/04/2023 CLINICAL DATA:  Weakness. EXAM: PORTABLE CHEST 1 VIEW COMPARISON:  06/24/2023 FINDINGS: Stable cardiomegaly. Mediastinal contours are unchanged. Mild bibasilar atelectasis. No pulmonary edema. No pleural fluid. No pneumothorax. Grossly stable osseous structures. IMPRESSION: Stable cardiomegaly. Mild bibasilar atelectasis. Electronically Signed   By: Andrea Gasman M.D.   On: 08/04/2023 18:59       Carlos Gill  M.D. [facility] 08/06/2023, 9:42 AM  Available via Epic secure chat 7am-7pm After 7 pm, please refer to night coverage provider listed on amion.

## 2023-08-06 NOTE — Progress Notes (Signed)
 Echocardiogram 2D Echocardiogram has been performed.  Damien FALCON Cervando Durnin RDCS 08/06/2023, 2:43 PM

## 2023-08-06 NOTE — Progress Notes (Signed)
   08/06/23 0853  Peritoneal Catheter Mid lower abdomen  No placement date or time found.   Catheter Location: Mid lower abdomen  Site Assessment Clean, Dry, Intact;Clean;Dry  Drainage Description None  Catheter status Capped;Clamped;Deaccessed;Patent  Dressing Status Clean, Dry, Intact  Completion  Treatment Status Complete  Initial Drain Volume 1  Average Dwell Time-Hour(s) 2  Average Dwell Time-Min(s) 0  Average Drain Time 49  Total Therapy Volume 100001  Total Therapy Time-Hour(s) 12  Total Therapy Time-Min(s) 4  Weight after Drain 217 lb 6 oz (98.6 kg)  Effluent Appearance Clear  Cell Count on Daytime Exchange N/A  Procedure Comments  Tolerated treatment well? Yes  Education / Care Plan  Dialysis Education Provided Yes  Hand-off documentation  Hand-off Received Received from Transfer Unit/facility  Report received from (Full Name) Joane Manifold RN   PD post treatment note  PD treatment completed. Patient tolerated treatment well. PD effluent is clear. No specimen collected.  PD exit site clean, dry and intact. Patient is awake, oriented and in no acute distress.  Report given to bedside nurse.   Post treatment VS: 110/58, 84 HR, 98.7 T, 98%  Total UF removed:     Post treatment weight: 98.6KG

## 2023-08-06 NOTE — Plan of Care (Signed)

## 2023-08-06 NOTE — Progress Notes (Signed)
 Patient continuous glucose monitor alarming for low reading this morning.  Check per our monitor, CBG 65. 4oz apple juice given, repeat CBG 94

## 2023-08-07 DIAGNOSIS — R42 Dizziness and giddiness: Secondary | ICD-10-CM | POA: Diagnosis not present

## 2023-08-07 DIAGNOSIS — Z992 Dependence on renal dialysis: Secondary | ICD-10-CM | POA: Diagnosis not present

## 2023-08-07 DIAGNOSIS — N186 End stage renal disease: Secondary | ICD-10-CM

## 2023-08-07 DIAGNOSIS — I493 Ventricular premature depolarization: Secondary | ICD-10-CM | POA: Diagnosis not present

## 2023-08-07 DIAGNOSIS — R079 Chest pain, unspecified: Secondary | ICD-10-CM | POA: Diagnosis not present

## 2023-08-07 DIAGNOSIS — N390 Urinary tract infection, site not specified: Secondary | ICD-10-CM | POA: Diagnosis not present

## 2023-08-07 LAB — BASIC METABOLIC PANEL WITH GFR
Anion gap: 11 (ref 5–15)
BUN: 49 mg/dL — ABNORMAL HIGH (ref 8–23)
CO2: 22 mmol/L (ref 22–32)
Calcium: 9.1 mg/dL (ref 8.9–10.3)
Chloride: 109 mmol/L (ref 98–111)
Creatinine, Ser: 4.75 mg/dL — ABNORMAL HIGH (ref 0.61–1.24)
GFR, Estimated: 12 mL/min — ABNORMAL LOW (ref >60)
Glucose, Bld: 92 mg/dL (ref 70–99)
Potassium: 3.6 mmol/L (ref 3.5–5.1)
Sodium: 142 mmol/L (ref 135–145)

## 2023-08-07 LAB — GLUCOSE, CAPILLARY
Glucose-Capillary: 59 mg/dL — ABNORMAL LOW (ref 70–99)
Glucose-Capillary: 77 mg/dL (ref 70–99)
Glucose-Capillary: 181 mg/dL — ABNORMAL HIGH (ref 70–99)
Glucose-Capillary: 101 mg/dL — ABNORMAL HIGH (ref 70–99)
Glucose-Capillary: 132 mg/dL — ABNORMAL HIGH (ref 70–99)

## 2023-08-07 LAB — C DIFFICILE QUICK SCREEN W PCR REFLEX
C Diff antigen: NEGATIVE
C Diff interpretation: NOT DETECTED
C Diff toxin: NEGATIVE

## 2023-08-07 LAB — MAGNESIUM: Magnesium: 2.3 mg/dL (ref 1.7–2.4)

## 2023-08-07 MED ORDER — INSULIN GLARGINE-YFGN 100 UNIT/ML ~~LOC~~ SOLN
25.0000 [IU] | Freq: Two times a day (BID) | SUBCUTANEOUS | Status: DC
  Administered 2023-08-07: 25 [IU] via SUBCUTANEOUS
  Filled 2023-08-07 (×3): qty 0.25

## 2023-08-07 MED ORDER — POTASSIUM CHLORIDE 20 MEQ PO PACK
20.0000 meq | PACK | Freq: Once | ORAL | Status: AC
  Administered 2023-08-07: 20 meq via ORAL
  Filled 2023-08-07: qty 1

## 2023-08-07 MED ORDER — GENTAMICIN SULFATE 0.1 % EX CREA: 1.0000 | TOPICAL_CREAM | Freq: Every day | CUTANEOUS | Status: DC

## 2023-08-07 NOTE — Progress Notes (Signed)
 [facility]                                                                              Carlos Gill, is a 83 y.o. male, DOB - 1940/08/11, FMW:[id number] Admit date - 08/04/2023    Outpatient Primary MD for the patient is [name], MD  LOS - 2  days  Chief Complaint  Patient presents with   Dizziness       Brief summary   Patient is a 83 year old male with DM type II, ESRD on peritoneal dialysis, HTN, macular degeneration, chronic diastolic CHF, GERD presented to ED with dizziness, lightheadedness that started earlier during the day of admission.  Patient reported chest pain on exertion and upon ambulation in the ER, no radiation, no nausea vomiting or diaphoresis.  Reported urinary frequency, urgency, no dysuria, hematuria or flank pain.  No dyspnea or cough or wheezing.  In ED, creatinine 4.73, troponin 15-> 29-> 26, chest x-ray showed stable cardiomegaly with mild bibasilar atelectasis.  UA positive for UTI Patient was given 1 g IV meropenem , nephrology was consulted and recommended transfer to [facility] for further evaluation given peritoneal dialysis.  Assessment & Plan    Chest pain -Atypical, 1 episode of substernal chest tightness while ambulating, lasted about 10 minutes, no further recurrence. -Seen by cardiology, troponins have improved, recommended no further cardiac workup at this time.  If he develops any recurrent exertional chest pain, will benefit from ischemia evaluation - Chest pain has currently resolved - 2D echo 06/2023 that showed EF of 50 to 55%, paradoxical septal motion consistent with LBBB, G1DD - Cardiology has signed off, plan outpatient Zio patch and ischemic evaluation outpatient  Dizziness, lightheadedness - Possibly from UTI, sepsis, PVCs - Seen by cardiology, recommended to correct electrolytes, 2-week event monitor upon discharge, obtain 2D echo - 2D echo showed EF of 55 to 60%, no regional WMA, indeterminate diastolic  parameters  Frequent PVCs, PACs - Improved, likely due to severe hypokalemia - Cardiology will arrange Zio patch, 2D echo with a EF 55 to 60%, no regional WMA, indeterminate diastolic parameters.   ESRD on peritoneal dialysis [facility]) -Nephrology consulted and following  - Management per renal, on home diuretics   Acute lower UTI -Patient with prior history of ESBL UTI - Urine culture shows E. coli, currently on IV meropenem  -Infectious disease consulted, seen by Dr. Eben, recommended to stop the IV meropenem , this is colonization.  Recommended to repeat urine culture only if he is symptomatic, leukocytosis, fever chills flank pain or lower abdominal pain.    Dyslipidemia -Continue Zetia  -Lipid panel showed HDL 40, LDL 120    Essential hypertension -Continue Coreg , nifedipine   - Continue IV hydralazine  as needed with parameters   Type 2 diabetes mellitus with peripheral neuropathy (HCC) -Hemoglobin A1c 8.6 on 06/27/2023 -CBGs low this morning  CBG (last 3)  Recent Labs    08/07/23 0824 08/07/23 0903 08/07/23 1210  GLUCAP 59* 77 181*   AM CBGs low again, will decrease Semglee  to 25 units twice daily (outpatient on 40 units twice daily) Continue sensitive SSI   GERD without esophagitis -Continue PPI.  BPH (benign prostatic hyperplasia) -Continue treatment with Flomax     Hypokalemia - Replaced  Estimated body mass index is 28.58 kg/m as calculated from the following:   Height as of 06/24/23: 6' (1.829 m).   Weight as of this encounter: 95.6 kg.  Code Status: Full code DVT Prophylaxis:  enoxaparin  (LOVENOX ) injection 30 mg Start: 08/05/23 1000   Level of Care: Level of care: Telemetry Cardiac Family Communication: Updated patient Disposition Plan:      Remains inpatient appropriate: DC home tomorrow, will observe overnight to ensure no fever chills, worsening leukocytosis, abdominal pain/flank pain,   Procedures:    Consultants:    Cardiology Nephrology  Antimicrobials:   Anti-infectives (From admission, onward)    Start     Dose/Rate Route Frequency Ordered Stop   08/05/23 1200  meropenem  (MERREM ) 1 g in sodium chloride  0.9 % 100 mL IVPB        1 g 200 mL/hr over 30 Minutes Intravenous Every 12 hours 08/05/23 0218     08/05/23 0600  meropenem  (MERREM ) 1 g in sodium chloride  0.9 % 100 mL IVPB  Status:  Discontinued        1 g 200 mL/hr over 30 Minutes Intravenous Every 8 hours 08/05/23 0208 08/05/23 0216   08/05/23 0015  meropenem  (MERREM ) 1 g in sodium chloride  0.9 % 100 mL IVPB        1 g 200 mL/hr over 30 Minutes Intravenous  Once 08/05/23 0002 08/05/23 0116   08/04/23 2330  cephALEXin  (KEFLEX ) capsule 250 mg  Status:  Discontinued        250 mg Oral  Once 08/04/23 2318 08/04/23 2333   08/04/23 0000  cephALEXin  (KEFLEX ) 250 MG capsule        250 mg Oral 2 times daily 08/04/23 2321            Medications  aspirin  EC  81 mg Oral q morning   calcitRIOL   0.5 mcg Oral BID   carvedilol   3.125 mg Oral BID WC   enoxaparin  (LOVENOX ) injection  30 mg Subcutaneous Q24H   ezetimibe   10 mg Oral Daily   furosemide   40 mg Oral BID   gabapentin   100-200 mg Oral BID   gentamicin  cream  1 Application Topical Daily   hydrALAZINE   100 mg Oral BID   insulin  aspart  0-5 Units Subcutaneous QHS   insulin  aspart  0-9 Units Subcutaneous TID WC   insulin  glargine-yfgn  30 Units Subcutaneous BID   multivitamin  1 tablet Oral Daily   NIFEdipine   120 mg Oral Daily   pantoprazole   40 mg Oral BID   tamsulosin   0.4 mg Oral QHS      Subjective:   Axil Copeman was seen and examined today.  No acute complaints, resting comfortably, seen in the morning.  No fever chills, chest pain, shortness of breath, abdominal pain, nausea vomiting  Objective:   Vitals:   08/07/23 0110 08/07/23 0520 08/07/23 0829 08/07/23 1206  BP: 133/66 (!) 146/66 (!) 146/66 (!) 140/76  Pulse: 83 83  88  Resp: 16 20 17 18   Temp: 98 F (36.7 C)  98.3 F (36.8 C) 97.6 F (36.4 C) 97.8 F (36.6 C)  TempSrc: Oral Oral Oral Oral  SpO2: 100% 99% 96%   Weight:        Intake/Output Summary (Last 24 hours) at 08/07/2023 1349 Last data filed at 08/07/2023 1037 Gross per 24 hour  Intake 1845 ml  Output --  Net 1845  ml     Wt Readings from Last 3 Encounters:  08/05/23 95.6 kg  07/01/23 100 kg  06/08/23 98.4 kg    Physical Exam General: Alert and oriented x 3, NAD Cardiovascular: S1 S2 clear, RRR.  Respiratory: CTAB, no wheezing Gastrointestinal: Soft, nontender, nondistended, NBS, PD cath + Ext: no pedal edema bilaterally Neuro: no new deficits Psych: Normal affect      Data Reviewed:  I have personally reviewed following labs    CBC Lab Results  Component Value Date   WBC 5.2 08/06/2023   RBC 4.51 08/06/2023   HGB 12.4 (L) 08/06/2023   HCT 38.8 (L) 08/06/2023   MCV 86.0 08/06/2023   MCH 27.5 08/06/2023   PLT 237 08/06/2023   MCHC 32.0 08/06/2023   RDW 16.7 (H) 08/06/2023   LYMPHSABS 1.5 08/04/2023   MONOABS 0.5 08/04/2023   EOSABS 0.2 08/04/2023   BASOSABS 0.0 08/04/2023     Last metabolic panel Lab Results  Component Value Date   NA 142 08/07/2023   K 3.6 08/07/2023   CL 109 08/07/2023   CO2 22 08/07/2023   BUN 49 (H) 08/07/2023   CREATININE 4.75 (H) 08/07/2023   GLUCOSE 92 08/07/2023   GFRNONAA 12 (L) 08/07/2023   GFRAA 42 (L) 05/02/2016   CALCIUM 9.1 08/07/2023   PHOS 3.3 08/06/2023   PROT 7.0 05/26/2023   ALBUMIN 2.7 (L) 08/06/2023   BILITOT 0.8 05/26/2023   ALKPHOS 76 05/26/2023   AST 22 05/26/2023   ALT 26 05/26/2023   ANIONGAP 11 08/07/2023    CBG (last 3)  Recent Labs    08/07/23 0824 08/07/23 0903 08/07/23 1210  GLUCAP 59* 77 181*      Coagulation Profile: No results for input(s): INR, PROTIME in the last 168 hours.   Radiology Studies: I have personally reviewed the imaging studies  ECHOCARDIOGRAM COMPLETE Result Date: 08/06/2023    ECHOCARDIOGRAM REPORT    Patient Name:   EOIN WILLDEN Date of Exam: 08/06/2023 Medical Rec #:  [id number]    Height:       72.0 in Accession #:    [id number]   Weight:       210.8 lb Date of Birth:  09/02/1940    BSA:          2.179 m Patient Age:    83 years     BP:           129/59 mmHg Patient Gender: M            HR:           83 bpm. Exam Location:  Inpatient Procedure: 2D Echo, Color Doppler and Cardiac Doppler (Both Spectral and Color            Flow Doppler were utilized during procedure). Indications:    R07.9* Chest pain, unspecified  History:        Patient has prior history of Echocardiogram examinations, most                 recent 06/26/2023. Risk Factors:Hypertension, Diabetes,                 Dyslipidemia and Sleep Apnea.  Sonographer:    Damien Senior RDCS Referring Phys: [id number] Jenifer K Muaad Boehning IMPRESSIONS  1. Left ventricular ejection fraction, by estimation, is 55 to 60%. The left ventricle has normal function. The left ventricle has no regional wall motion abnormalities. There is moderate concentric left ventricular hypertrophy. Left ventricular diastolic parameters are  indeterminate.  2. Right ventricular systolic function is normal. The right ventricular size is normal. There is normal pulmonary artery systolic pressure. The estimated right ventricular systolic pressure is 24.9 mmHg.  3. A small pericardial effusion is present. The pericardial effusion is circumferential. There is no evidence of cardiac tamponade.  4. The mitral valve is grossly normal. Mild mitral valve regurgitation.  5. The aortic valve is tricuspid. Aortic valve regurgitation is trivial. No aortic stenosis is present. Aortic valve mean gradient measures 7.0 mmHg.  6. The inferior vena cava is normal in size with greater than 50% respiratory variability, suggesting right atrial pressure of 3 mmHg. Comparison(s): Prior images reviewed side by side. LVEF 55-60%, septal motion constent with LBBB. Estimated RVSP now normal. Mild mitral and tricuspid  regurgitation. Small pericardial effusion. FINDINGS  Left Ventricle: Left ventricular ejection fraction, by estimation, is 55 to 60%. The left ventricle has normal function. The left ventricle has no regional wall motion abnormalities. The left ventricular internal cavity size was normal in size. There is  moderate concentric left ventricular hypertrophy. Abnormal (paradoxical) septal motion, consistent with left bundle branch block. Left ventricular diastolic parameters are indeterminate. Right Ventricle: The right ventricular size is normal. No increase in right ventricular wall thickness. Right ventricular systolic function is normal. There is normal pulmonary artery systolic pressure. The tricuspid regurgitant velocity is 2.34 m/s, and  with an assumed right atrial pressure of 3 mmHg, the estimated right ventricular systolic pressure is 24.9 mmHg. Left Atrium: Left atrial size was normal in size. Right Atrium: Right atrial size was normal in size. Pericardium: A small pericardial effusion is present. The pericardial effusion is circumferential. There is no evidence of cardiac tamponade. Mitral Valve: The mitral valve is grossly normal. Mild mitral valve regurgitation. Tricuspid Valve: The tricuspid valve is grossly normal. Tricuspid valve regurgitation is mild. Aortic Valve: The aortic valve is tricuspid. There is mild aortic valve annular calcification. Aortic valve regurgitation is trivial. No aortic stenosis is present. Aortic valve mean gradient measures 7.0 mmHg. Aortic valve peak gradient measures 10.8 mmHg. Aortic valve area, by VTI measures 2.59 cm. Pulmonic Valve: The pulmonic valve was grossly normal. Pulmonic valve regurgitation is not visualized. Aorta: The aortic root and ascending aorta are structurally normal, with no evidence of dilitation. Venous: The inferior vena cava is normal in size with greater than 50% respiratory variability, suggesting right atrial pressure of 3 mmHg. IAS/Shunts: No  atrial level shunt detected by color flow Doppler. Additional Comments: 3D was performed not requiring image post processing on an independent workstation and was indeterminate.  LEFT VENTRICLE PLAX 2D LVIDd:         4.00 cm   Diastology LVIDs:         3.20 cm   LV e' medial:    4.03 cm/s LV PW:         1.30 cm   LV E/e' medial:  18.6 LV IVS:        1.30 cm   LV e' lateral:   3.37 cm/s LVOT diam:     2.10 cm   LV E/e' lateral: 22.2 LV SV:         79 LV SV Index:   36 LVOT Area:     3.46 cm  RIGHT VENTRICLE RV S prime:     13.70 cm/s TAPSE (M-mode): 2.3 cm LEFT ATRIUM             Index        RIGHT ATRIUM  Index LA diam:        3.80 cm 1.74 cm/m   RA Area:     22.20 cm LA Vol (A2C):   66.9 ml 30.71 ml/m  RA Volume:   63.40 ml  29.10 ml/m LA Vol (A4C):   52.5 ml 24.10 ml/m LA Biplane Vol: 59.4 ml 27.27 ml/m  AORTIC VALVE AV Area (Vmax):    2.56 cm AV Area (Vmean):   2.42 cm AV Area (VTI):     2.59 cm AV Vmax:           164.00 cm/s AV Vmean:          129.000 cm/s AV VTI:            0.303 m AV Peak Grad:      10.8 mmHg AV Mean Grad:      7.0 mmHg LVOT Vmax:         121.00 cm/s LVOT Vmean:        90.000 cm/s LVOT VTI:          0.227 m LVOT/AV VTI ratio: 0.75  AORTA Ao Root diam: 3.00 cm Ao Asc diam:  2.80 cm MITRAL VALVE                TRICUSPID VALVE MV Area (PHT): 3.63 cm     TR Peak grad:   21.9 mmHg MV Decel Time: 209 msec     TR Vmax:        234.00 cm/s MV E velocity: 74.90 cm/s MV A velocity: 106.00 cm/s  SHUNTS MV E/A ratio:  0.71         Systemic VTI:  0.23 m                             Systemic Diam: 2.10 cm Jayson Sierras MD Electronically signed by Jayson Sierras MD Signature Date/Time: 08/06/2023/3:07:58 PM    Final        Nydia Distance M.D. [facility] 08/07/2023, 1:49 PM  Available via Epic secure chat 7am-7pm After 7 pm, please refer to night coverage provider listed on amion.

## 2023-08-07 NOTE — Consult Note (Addendum)
 [facility]    Date of Admission:  08/04/2023   Total days of antibiotics: 2 merrem                Reason for Consult: Recurrent UTI    Referring Provider: Rai   Assessment: ESRD Recurrent UTI vs colonization Dizziness DM2  Plan: Stop merrem  He needs to remain on contact isolation for this drug resistant organism.   Comment- he is colonized. I explained to pt that due to his low urine volumes, he does not flush his bladder as a normal person would He will most likely remain colonized.  I would only repeat his UCx if he was symptomatic, elevated WBC, fever, chills, flank pain, lower abd pain. He denies all of these to me.   Thank you so much for this interesting consult, Avail able as needed.   Principal Problem:   Chest pain Active Problems:   Essential hypertension   GERD without esophagitis   Dyslipidemia   ESRD on peritoneal dialysis (HCC)   Acute lower UTI   Type 2 diabetes mellitus with peripheral neuropathy (HCC)   BPH (benign prostatic hyperplasia)   Lightheadedness   Hypokalemia   Frequent PVCs    aspirin  EC  81 mg Oral q morning   calcitRIOL   0.5 mcg Oral BID   carvedilol   3.125 mg Oral BID WC   enoxaparin  (LOVENOX ) injection  30 mg Subcutaneous Q24H   ezetimibe   10 mg Oral Daily   furosemide   40 mg Oral BID   gabapentin   100-200 mg Oral BID   gentamicin  cream  1 Application Topical Daily   hydrALAZINE   100 mg Oral BID   insulin  aspart  0-5 Units Subcutaneous QHS   insulin  aspart  0-9 Units Subcutaneous TID WC   insulin  glargine-yfgn  30 Units Subcutaneous BID   multivitamin  1 tablet Oral Daily   NIFEdipine   120 mg Oral Daily   pantoprazole   40 mg Oral BID   tamsulosin   0.4 mg Oral QHS    HPI: ANANTH FIALLOS is a 83 y.o. male with hx of ESRD on PD, DM2, CHF, adm on 7-4 with lightheadedness and CP on exertion.  He also c/o urinary frequency and urgency. In ED he was found to be hypertensive, afebrile.  WBC  4.7. Due to his sx he had a UA sent which showed many bacteria and 11-20 WBC (similar to his prior UAs).  He had a UCx sent which is growing E coli, sensi pending.  he had prev E coli ESBL UCx and BCx 06-2023. He was started on merrem .  Trop 15 -> 29 --> 26.    Review of Systems: Review of Systems  Constitutional:  Negative for chills and fever.  Gastrointestinal:  Negative for abdominal pain, constipation and diarrhea.  Genitourinary:  Negative for dysuria, flank pain, frequency and urgency.    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 Last Assessment & Plan:  Formatting of this note is different from the original.  - We  will continue Flomax .   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   Obesity 04/11/2017   Osteoarthritis 11/11/2008   Other chest pain 04/21/2021   Proteinuria 01/21/2021   Rhinitis, chronic 03/09/2021   Shortness of breath 04/21/2021   Sleep apnea    cpap    Trochanteric bursitis of right hip 04/27/2017   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    Social History   Tobacco Use   Smoking status: Former    Current packs/day: 0.00    Types: Cigarettes    Quit date: 02/01/1990    Years since quitting: 33.5   Smokeless tobacco: Never  Vaping Use   Vaping status: Never Used  Substance Use Topics   Alcohol  use: No   Drug use: No    Family History  Problem Relation Age of Onset   Diabetes Mother    Prostate cancer Maternal Grandfather    Colon cancer Neg Hx      Medications: Scheduled:  aspirin  EC  81 mg Oral q morning   calcitRIOL   0.5 mcg Oral BID   carvedilol   3.125 mg Oral BID WC   enoxaparin  (LOVENOX ) injection  30 mg Subcutaneous Q24H   ezetimibe   10 mg Oral Daily   furosemide   40 mg Oral BID   gabapentin   100-200 mg Oral BID   gentamicin  cream  1 Application Topical Daily   hydrALAZINE   100 mg Oral BID   insulin  aspart  0-5 Units Subcutaneous QHS   insulin  aspart  0-9 Units Subcutaneous TID WC   insulin  glargine-yfgn  30 Units Subcutaneous BID   multivitamin  1 tablet Oral Daily   NIFEdipine   120 mg Oral Daily   pantoprazole   40 mg Oral BID   tamsulosin   0.4 mg Oral QHS    Abtx:  Anti-infectives (From admission, onward)    Start     Dose/Rate Route Frequency Ordered Stop   08/05/23 1200  meropenem  (MERREM ) 1 g in sodium chloride  0.9 % 100 mL IVPB        1 g 200 mL/hr over 30 Minutes Intravenous Every 12 hours 08/05/23 0218     08/05/23 0600  meropenem  (MERREM ) 1 g in sodium chloride  0.9 % 100 mL IVPB  Status:  Discontinued        1 g 200 mL/hr over 30 Minutes Intravenous Every 8 hours 08/05/23 0208 08/05/23 0216   08/05/23 0015  meropenem  (MERREM ) 1 g in sodium chloride  0.9 % 100 mL IVPB        1 g 200 mL/hr over 30 Minutes Intravenous  Once 08/05/23 0002 08/05/23 0116   08/04/23 2330  cephALEXin  (KEFLEX ) capsule 250 mg  Status:  Discontinued        250 mg Oral  Once 08/04/23 2318  08/04/23 2333   08/04/23 0000  cephALEXin  (KEFLEX ) 250 MG capsule        250 mg Oral 2 times daily 08/04/23 2321           OBJECTIVE: Blood pressure (!) 140/76, pulse 88,  temperature 97.8 F (36.6 C), temperature source Oral, resp. rate 18, weight 95.6 kg, SpO2 96%.  Physical Exam Vitals reviewed.  Constitutional:      General: He is not in acute distress.    Appearance: He is obese. He is not ill-appearing.  HENT:     Mouth/Throat:     Mouth: Mucous membranes are moist.     Pharynx: No oropharyngeal exudate.  Eyes:     Extraocular Movements: Extraocular movements intact.  Cardiovascular:     Rate and Rhythm: Normal rate and regular rhythm.  Pulmonary:     Effort: Pulmonary effort is normal.     Breath sounds: Normal breath sounds.  Abdominal:     General: Bowel sounds are normal. There is distension.     Palpations: Abdomen is soft.     Tenderness: There is no abdominal tenderness. There is no right CVA tenderness, left CVA tenderness or guarding.  Musculoskeletal:     Cervical back: Normal range of motion and neck supple.     Right lower leg: No edema.     Left lower leg: No edema.  Neurological:     Mental Status: He is alert.     Lab Results Results for orders placed or performed during the hospital encounter of 08/04/23 (from the past 48 hours)  Basic metabolic panel     Status: Abnormal   Collection Time: 08/05/23  3:48 PM  Result Value Ref Range   Sodium 141 135 - 145 mmol/L   Potassium 3.8 3.5 - 5.1 mmol/L   Chloride 107 98 - 111 mmol/L   CO2 22 22 - 32 mmol/L   Glucose, Bld 257 (H) 70 - 99 mg/dL    Comment: Glucose reference range applies only to samples taken after fasting for at least 8 hours.   BUN 48 (H) 8 - 23 mg/dL   Creatinine, Ser 6.09 (H) 0.61 - 1.24 mg/dL   Calcium 8.9 8.9 - 89.6 mg/dL   GFR, Estimated 15 (L) >60 mL/min    Comment: (NOTE) Calculated using the CKD-EPI Creatinine Equation (2021)    Anion gap 12 5 - 15    Comment: Performed  at [facility], [address] [place], [place] [address]  Phosphorus     Status: None   Collection Time: 08/05/23  3:48 PM  Result Value Ref Range   Phosphorus 3.1 2.5 - 4.6 mg/dL    Comment: Performed at [facility], [address], [place], [place] [address]  Glucose, capillary     Status: Abnormal   Collection Time: 08/05/23  4:48 PM  Result Value Ref Range   Glucose-Capillary 220 (H) 70 - 99 mg/dL    Comment: Glucose reference range applies only to samples taken after fasting for at least 8 hours.  Glucose, capillary     Status: Abnormal   Collection Time: 08/05/23  9:05 PM  Result Value Ref Range   Glucose-Capillary 121 (H) 70 - 99 mg/dL    Comment: Glucose reference range applies only to samples taken after fasting for at least 8 hours.  Renal function panel     Status: Abnormal   Collection Time: 08/06/23  2:28 AM  Result Value Ref Range   Sodium 139 135 - 145 mmol/L   Potassium 3.2 (L) 3.5 - 5.1 mmol/L   Chloride 108 98 - 111 mmol/L   CO2 22 22 - 32 mmol/L   Glucose, Bld 97 70 - 99 mg/dL    Comment: Glucose reference  range applies only to samples taken after fasting for at least 8 hours.   BUN 49 (H) 8 - 23 mg/dL   Creatinine, Ser 5.80 (H) 0.61 - 1.24 mg/dL   Calcium 8.9 8.9 - 89.6 mg/dL   Phosphorus 3.1 2.5 - 4.6 mg/dL   Albumin 3.0 (L) 3.5 - 5.0 g/dL   GFR, Estimated 13 (L) >60 mL/min    Comment: (NOTE) Calculated using the CKD-EPI Creatinine Equation (2021)    Anion gap 9 5 - 15    Comment: Performed at [facility], [address], [place], [place] [address]  Magnesium      Status: None   Collection Time: 08/06/23  2:28 AM  Result Value Ref Range   Magnesium  1.7 1.7 - 2.4 mg/dL    Comment: Performed at [facility], [address] [place], [place] [address]  CBC     Status: Abnormal   Collection Time: 08/06/23  2:28 AM  Result Value Ref Range   WBC 5.2 4.0 - 10.5 K/uL   RBC 4.51 4.22 - 5.81 MIL/uL   Hemoglobin 12.4 (L) 13.0 - 17.0  g/dL   HCT 61.1 (L) 60.9 - 47.9 %   MCV 86.0 80.0 - 100.0 fL   MCH 27.5 26.0 - 34.0 pg   MCHC 32.0 30.0 - 36.0 g/dL   RDW 83.2 (H) 88.4 - 84.4 %   Platelets 237 150 - 400 K/uL   nRBC 0.0 0.0 - 0.2 %    Comment: Performed at [facility], [address] [place], [place] [address]  Glucose, capillary     Status: Abnormal   Collection Time: 08/06/23  5:46 AM  Result Value Ref Range   Glucose-Capillary 65 (L) 70 - 99 mg/dL    Comment: Glucose reference range applies only to samples taken after fasting for at least 8 hours.  Glucose, capillary     Status: None   Collection Time: 08/06/23  6:12 AM  Result Value Ref Range   Glucose-Capillary 94 70 - 99 mg/dL    Comment: Glucose reference range applies only to samples taken after fasting for at least 8 hours.  Glucose, capillary     Status: Abnormal   Collection Time: 08/06/23  7:35 AM  Result Value Ref Range   Glucose-Capillary 68 (L) 70 - 99 mg/dL    Comment: Glucose reference range applies only to samples taken after fasting for at least 8 hours.  Glucose, capillary     Status: Abnormal   Collection Time: 08/06/23 11:50 AM  Result Value Ref Range   Glucose-Capillary 121 (H) 70 - 99 mg/dL    Comment: Glucose reference range applies only to samples taken after fasting for at least 8 hours.  Glucose, capillary     Status: Abnormal   Collection Time: 08/06/23  4:03 PM  Result Value Ref Range   Glucose-Capillary 199 (H) 70 - 99 mg/dL    Comment: Glucose reference range applies only to samples taken after fasting for at least 8 hours.  Renal function panel     Status: Abnormal   Collection Time: 08/06/23  5:48 PM  Result Value Ref Range   Sodium 141 135 - 145 mmol/L   Potassium 4.2 3.5 - 5.1 mmol/L   Chloride 107 98 - 111 mmol/L   CO2 22 22 - 32 mmol/L   Glucose, Bld 181 (H) 70 - 99 mg/dL    Comment: Glucose reference range applies only to samples taken after fasting for at  least 8 hours.   BUN 51 (H) 8 - 23 mg/dL    Creatinine, Ser 5.45 (H) 0.61 - 1.24 mg/dL   Calcium 9.1 8.9 - 89.6 mg/dL   Phosphorus 3.3 2.5 - 4.6 mg/dL   Albumin 2.7 (L) 3.5 - 5.0 g/dL   GFR, Estimated 12 (L) >60 mL/min    Comment: (NOTE) Calculated using the CKD-EPI Creatinine Equation (2021)    Anion gap 12 5 - 15    Comment: Performed at [facility], [address] [place], [place] [address]  Glucose, capillary     Status: Abnormal   Collection Time: 08/06/23  9:15 PM  Result Value Ref Range   Glucose-Capillary 113 (H) 70 - 99 mg/dL    Comment: Glucose reference range applies only to samples taken after fasting for at least 8 hours.  Basic metabolic panel     Status: Abnormal   Collection Time: 08/07/23  3:13 AM  Result Value Ref Range   Sodium 142 135 - 145 mmol/L   Potassium 3.6 3.5 - 5.1 mmol/L   Chloride 109 98 - 111 mmol/L   CO2 22 22 - 32 mmol/L   Glucose, Bld 92 70 - 99 mg/dL    Comment: Glucose reference range applies only to samples taken after fasting for at least 8 hours.   BUN 49 (H) 8 - 23 mg/dL   Creatinine, Ser 5.24 (H) 0.61 - 1.24 mg/dL   Calcium 9.1 8.9 - 89.6 mg/dL   GFR, Estimated 12 (L) >60 mL/min    Comment: (NOTE) Calculated using the CKD-EPI Creatinine Equation (2021)    Anion gap 11 5 - 15    Comment: Performed at [facility], [address], [place], [place] [address]  Magnesium      Status: None   Collection Time: 08/07/23  3:13 AM  Result Value Ref Range   Magnesium  2.3 1.7 - 2.4 mg/dL    Comment: Performed at [facility], [address] [place], [place] [address]  Glucose, capillary     Status: Abnormal   Collection Time: 08/07/23  8:24 AM  Result Value Ref Range   Glucose-Capillary 59 (L) 70 - 99 mg/dL    Comment: Glucose reference range applies only to samples taken after fasting for at least 8 hours.  Glucose, capillary     Status: None   Collection Time: 08/07/23  9:03 AM  Result Value Ref Range   Glucose-Capillary 77 70 - 99 mg/dL    Comment: Glucose  reference range applies only to samples taken after fasting for at least 8 hours.  Glucose, capillary     Status: Abnormal   Collection Time: 08/07/23 12:10 PM  Result Value Ref Range   Glucose-Capillary 181 (H) 70 - 99 mg/dL    Comment: Glucose reference range applies only to samples taken after fasting for at least 8 hours.      Component Value Date/Time   SDES  08/04/2023 1959    URINE, CLEAN CATCH Performed at [facility], [address] [place], [place] [address]    [facility]  08/04/2023 1959    NONE Performed at [facility], [address], [place], [place] [address]    CULT (A) 08/04/2023 1959    >=100,000 COLONIES/mL ESCHERICHIA COLI REPEATING SUSCEPTIBILITY Performed at [facility], [address], [place], [place] [address]    REPTSTATUS PENDING 08/04/2023 1959   ECHOCARDIOGRAM COMPLETE Result Date: 08/06/2023    ECHOCARDIOGRAM REPORT   Patient Name:  ALEXA VEAR SIAD Date of Exam: 08/06/2023 Medical Rec #:  [id number]    Height:       72.0 in Accession #:    [id number]   Weight:       210.8 lb Date of Birth:  [birth year]    BSA:          2.179 m Patient Age:    83 years     BP:           129/59 mmHg Patient Gender: M            HR:           83 bpm. Exam Location:  Inpatient Procedure: 2D Echo, Color Doppler and Cardiac Doppler (Both Spectral and Color            Flow Doppler were utilized during procedure). Indications:    R07.9* Chest pain, unspecified  History:        Patient has prior history of Echocardiogram examinations, most                 recent 06/26/2023. Risk Factors:Hypertension, Diabetes,                 Dyslipidemia and Sleep Apnea.  Sonographer:    Damien Senior RDCS Referring Phys: [id number] RIPUDEEP K RAI IMPRESSIONS  1. Left ventricular ejection fraction, by estimation, is 55 to 60%. The left ventricle has normal function. The left ventricle has no regional wall motion abnormalities. There is moderate concentric left ventricular hypertrophy. Left ventricular  diastolic parameters are indeterminate.  2. Right ventricular systolic function is normal. The right ventricular size is normal. There is normal pulmonary artery systolic pressure. The estimated right ventricular systolic pressure is 24.9 mmHg.  3. A small pericardial effusion is present. The pericardial effusion is circumferential. There is no evidence of cardiac tamponade.  4. The mitral valve is grossly normal. Mild mitral valve regurgitation.  5. The aortic valve is tricuspid. Aortic valve regurgitation is trivial. No aortic stenosis is present. Aortic valve mean gradient measures 7.0 mmHg.  6. The inferior vena cava is normal in size with greater than 50% respiratory variability, suggesting right atrial pressure of 3 mmHg. Comparison(s): Prior images reviewed side by side. LVEF 55-60%, septal motion constent with LBBB. Estimated RVSP now normal. Mild mitral and tricuspid regurgitation. Small pericardial effusion. FINDINGS  Left Ventricle: Left ventricular ejection fraction, by estimation, is 55 to 60%. The left ventricle has normal function. The left ventricle has no regional wall motion abnormalities. The left ventricular internal cavity size was normal in size. There is  moderate concentric left ventricular hypertrophy. Abnormal (paradoxical) septal motion, consistent with left bundle branch block. Left ventricular diastolic parameters are indeterminate. Right Ventricle: The right ventricular size is normal. No increase in right ventricular wall thickness. Right ventricular systolic function is normal. There is normal pulmonary artery systolic pressure. The tricuspid regurgitant velocity is 2.34 m/s, and  with an assumed right atrial pressure of 3 mmHg, the estimated right ventricular systolic pressure is 24.9 mmHg. Left Atrium: Left atrial size was normal in size. Right Atrium: Right atrial size was normal in size. Pericardium: A small pericardial effusion is present. The pericardial effusion is  circumferential. There is no evidence of cardiac tamponade. Mitral Valve: The mitral valve is grossly normal. Mild mitral valve regurgitation. Tricuspid Valve: The tricuspid valve is grossly normal. Tricuspid valve regurgitation is mild. Aortic Valve: The aortic valve is tricuspid. There is mild aortic valve annular calcification. Aortic valve regurgitation  is trivial. No aortic stenosis is present. Aortic valve mean gradient measures 7.0 mmHg. Aortic valve peak gradient measures 10.8 mmHg. Aortic valve area, by VTI measures 2.59 cm. Pulmonic Valve: The pulmonic valve was grossly normal. Pulmonic valve regurgitation is not visualized. Aorta: The aortic root and ascending aorta are structurally normal, with no evidence of dilitation. Venous: The inferior vena cava is normal in size with greater than 50% respiratory variability, suggesting right atrial pressure of 3 mmHg. IAS/Shunts: No atrial level shunt detected by color flow Doppler. Additional Comments: 3D was performed not requiring image post processing on an independent workstation and was indeterminate.  LEFT VENTRICLE PLAX 2D LVIDd:         4.00 cm   Diastology LVIDs:         3.20 cm   LV e' medial:    4.03 cm/s LV PW:         1.30 cm   LV E/e' medial:  18.6 LV IVS:        1.30 cm   LV e' lateral:   3.37 cm/s LVOT diam:     2.10 cm   LV E/e' lateral: 22.2 LV SV:         79 LV SV Index:   36 LVOT Area:     3.46 cm  RIGHT VENTRICLE RV S prime:     13.70 cm/s TAPSE (M-mode): 2.3 cm LEFT ATRIUM             Index        RIGHT ATRIUM           Index LA diam:        3.80 cm 1.74 cm/m   RA Area:     22.20 cm LA Vol (A2C):   66.9 ml 30.71 ml/m  RA Volume:   63.40 ml  29.10 ml/m LA Vol (A4C):   52.5 ml 24.10 ml/m LA Biplane Vol: 59.4 ml 27.27 ml/m  AORTIC VALVE AV Area (Vmax):    2.56 cm AV Area (Vmean):   2.42 cm AV Area (VTI):     2.59 cm AV Vmax:           164.00 cm/s AV Vmean:          129.000 cm/s AV VTI:            0.303 m AV Peak Grad:      10.8 mmHg  AV Mean Grad:      7.0 mmHg LVOT Vmax:         121.00 cm/s LVOT Vmean:        90.000 cm/s LVOT VTI:          0.227 m LVOT/AV VTI ratio: 0.75  AORTA Ao Root diam: 3.00 cm Ao Asc diam:  2.80 cm MITRAL VALVE                TRICUSPID VALVE MV Area (PHT): 3.63 cm     TR Peak grad:   21.9 mmHg MV Decel Time: 209 msec     TR Vmax:        234.00 cm/s MV E velocity: 74.90 cm/s MV A velocity: 106.00 cm/s  SHUNTS MV E/A ratio:  0.71         Systemic VTI:  0.23 m                             Systemic Diam: 2.10 cm Jayson Sierras MD Electronically signed by  Jayson Sierras MD Signature Date/Time: 08/06/2023/3:07:58 PM    Final    Recent Results (from the past 240 hours)  Urine Culture     Status: Abnormal (Preliminary result)   Collection Time: 08/04/23  7:59 PM   Specimen: Urine, Clean Catch  Result Value Ref Range Status   Specimen Description   Final    URINE, CLEAN CATCH Performed at [facility], [address], [place], [place] [address]    Special Requests   Final    NONE Performed at [facility], [address], [place], [place] [address]    Culture (A)  Final    >=100,000 COLONIES/mL ESCHERICHIA COLI REPEATING SUSCEPTIBILITY Performed at [facility], [address], [place], [place] [address]    Report Status PENDING  Incomplete    Microbiology: Recent Results (from the past 240 hours)  Urine Culture     Status: Abnormal (Preliminary result)   Collection Time: 08/04/23  7:59 PM   Specimen: Urine, Clean Catch  Result Value Ref Range Status   Specimen Description   Final    URINE, CLEAN CATCH Performed at [facility], [address], [place], [place] [address]    Special Requests   Final    NONE Performed at [facility], [address] [place], [place] [address]    Culture (A)  Final    >=100,000 COLONIES/mL ESCHERICHIA COLI REPEATING SUSCEPTIBILITY Performed at [facility], [address], [place], [place] [address]    Report Status PENDING  Incomplete     Radiographs and labs were personally reviewed by me.   Reyes Fenton, MD [facility] [phone] 08/07/2023, 1:19 PM

## 2023-08-07 NOTE — Progress Notes (Signed)
 Rounding Note   Patient Name: Carlos Gill Date of Encounter: 08/07/2023  [facility] Cardiologist: None   Subjective BP 146/66.  Potassium 3.6, magnesium  2.3 today.  Denies any chest pain or dyspnea  Scheduled Meds:  aspirin  EC  81 mg Oral q morning   calcitRIOL   0.5 mcg Oral BID   carvedilol   3.125 mg Oral BID WC   enoxaparin  (LOVENOX ) injection  30 mg Subcutaneous Q24H   ezetimibe   10 mg Oral Daily   furosemide   40 mg Oral BID   gabapentin   100-200 mg Oral BID   gentamicin  cream  1 Application Topical Daily   hydrALAZINE   100 mg Oral BID   insulin  aspart  0-5 Units Subcutaneous QHS   insulin  aspart  0-9 Units Subcutaneous TID WC   insulin  glargine-yfgn  30 Units Subcutaneous BID   multivitamin  1 tablet Oral Daily   NIFEdipine   120 mg Oral Daily   pantoprazole   40 mg Oral BID   tamsulosin   0.4 mg Oral QHS   Continuous Infusions:  dialysis solution 1.5% low-MG/low-CA     dialysis solution 2.5% low-MG/low-CA     dialysis solution 4.25% low-MG/low-CA     meropenem  (MERREM ) IV Stopped (08/07/23 0101)   PRN Meds: acetaminophen  **OR** acetaminophen , HYDROmorphone  (DILAUDID ) injection, magnesium  hydroxide, nitroGLYCERIN , ondansetron  **OR** ondansetron  (ZOFRAN ) IV, traZODone    Vital Signs  Vitals:   08/06/23 2029 08/07/23 0110 08/07/23 0520 08/07/23 0829  BP: 114/62 133/66 (!) 146/66 (!) 146/66  Pulse: 92 83 83   Resp: 20 16 20 17   Temp: 97.7 F (36.5 C) 98 F (36.7 C) 98.3 F (36.8 C) 97.6 F (36.4 C)  TempSrc: Tympanic Oral Oral Oral  SpO2: 100% 100% 99% 96%  Weight:        Intake/Output Summary (Last 24 hours) at 08/07/2023 1051 Last data filed at 08/07/2023 1037 Gross per 24 hour  Intake 1845 ml  Output --  Net 1845 ml      08/05/2023    8:12 PM 08/04/2023    5:23 PM 07/01/2023    5:00 AM  Last 3 Weights  Weight (lbs) 210 lb 12.2 oz 215 lb 220 lb 6.4 oz  Weight (kg) 95.6 kg 97.523 kg 99.973 kg      Telemetry Sinus rhythm with PACs/PVCs-  Personally Reviewed  ECG  No new- Personally Reviewed  Physical Exam  GEN: No acute distress.   Neck: No JVD Cardiac: Irregular, normal rate, no murmurs, rubs, or gallops.  Respiratory: Clear to auscultation bilaterally. GI: Soft, nontender, non-distended  MS: No edema; No deformity. Neuro:  Nonfocal  Psych: Normal affect   Labs High Sensitivity Troponin:   Recent Labs  Lab 08/04/23 1730 08/04/23 1949 08/04/23 2154  TROPONINIHS 15 29* 26*     Chemistry Recent Labs  Lab 08/06/23 0228 08/06/23 1748 08/07/23 0313  NA 139 141 142  K 3.2* 4.2 3.6  CL 108 107 109  CO2 22 22 22   GLUCOSE 97 181* 92  BUN 49* 51* 49*  CREATININE 4.19* 4.54* 4.75*  CALCIUM 8.9 9.1 9.1  MG 1.7  --  2.3  ALBUMIN 3.0* 2.7*  --   GFRNONAA 13* 12* 12*  ANIONGAP 9 12 11     Lipids  Recent Labs  Lab 08/05/23 0456  CHOL 181  TRIG 105  HDL 40*  LDLCALC 120*  CHOLHDL 4.5    Hematology Recent Labs  Lab 08/04/23 1730 08/05/23 0456 08/06/23 0228  WBC 4.7 5.1 5.2  RBC 3.99* 4.39  4.51  HGB 11.3* 11.8* 12.4*  HCT 34.2* 38.2* 38.8*  MCV 85.7 87.0 86.0  MCH 28.3 26.9 27.5  MCHC 33.0 30.9 32.0  RDW 16.5* 16.3* 16.7*  PLT 243 233 237   Thyroid No results for input(s): TSH, FREET4 in the last 168 hours.  BNPNo results for input(s): BNP, PROBNP in the last 168 hours.  DDimer No results for input(s): DDIMER in the last 168 hours.   Radiology  ECHOCARDIOGRAM COMPLETE Result Date: 08/06/2023    ECHOCARDIOGRAM REPORT   Patient Name:   Carlos Gill Date of Exam: 08/06/2023 Medical Rec #:  [id number]    Height:       72.0 in Accession #:    [id number]   Weight:       210.8 lb Date of Birth:  May 31, 1940    BSA:          2.179 m Patient Age:    83 years     BP:           129/59 mmHg Patient Gender: M            HR:           83 bpm. Exam Location:  Inpatient Procedure: 2D Echo, Color Doppler and Cardiac Doppler (Both Spectral and Color            Flow Doppler were utilized during procedure).  Indications:    R07.9* Chest pain, unspecified  History:        Patient has prior history of Echocardiogram examinations, most                 recent 06/26/2023. Risk Factors:Hypertension, Diabetes,                 Dyslipidemia and Sleep Apnea.  Sonographer:    Damien Senior RDCS Referring Phys: [id number] RIPUDEEP K RAI IMPRESSIONS  1. Left ventricular ejection fraction, by estimation, is 55 to 60%. The left ventricle has normal function. The left ventricle has no regional wall motion abnormalities. There is moderate concentric left ventricular hypertrophy. Left ventricular diastolic parameters are indeterminate.  2. Right ventricular systolic function is normal. The right ventricular size is normal. There is normal pulmonary artery systolic pressure. The estimated right ventricular systolic pressure is 24.9 mmHg.  3. A small pericardial effusion is present. The pericardial effusion is circumferential. There is no evidence of cardiac tamponade.  4. The mitral valve is grossly normal. Mild mitral valve regurgitation.  5. The aortic valve is tricuspid. Aortic valve regurgitation is trivial. No aortic stenosis is present. Aortic valve mean gradient measures 7.0 mmHg.  6. The inferior vena cava is normal in size with greater than 50% respiratory variability, suggesting right atrial pressure of 3 mmHg. Comparison(s): Prior images reviewed side by side. LVEF 55-60%, septal motion constent with LBBB. Estimated RVSP now normal. Mild mitral and tricuspid regurgitation. Small pericardial effusion. FINDINGS  Left Ventricle: Left ventricular ejection fraction, by estimation, is 55 to 60%. The left ventricle has normal function. The left ventricle has no regional wall motion abnormalities. The left ventricular internal cavity size was normal in size. There is  moderate concentric left ventricular hypertrophy. Abnormal (paradoxical) septal motion, consistent with left bundle branch block. Left ventricular diastolic parameters are  indeterminate. Right Ventricle: The right ventricular size is normal. No increase in right ventricular wall thickness. Right ventricular systolic function is normal. There is normal pulmonary artery systolic pressure. The tricuspid regurgitant velocity is 2.34 m/s, and  with an assumed  right atrial pressure of 3 mmHg, the estimated right ventricular systolic pressure is 24.9 mmHg. Left Atrium: Left atrial size was normal in size. Right Atrium: Right atrial size was normal in size. Pericardium: A small pericardial effusion is present. The pericardial effusion is circumferential. There is no evidence of cardiac tamponade. Mitral Valve: The mitral valve is grossly normal. Mild mitral valve regurgitation. Tricuspid Valve: The tricuspid valve is grossly normal. Tricuspid valve regurgitation is mild. Aortic Valve: The aortic valve is tricuspid. There is mild aortic valve annular calcification. Aortic valve regurgitation is trivial. No aortic stenosis is present. Aortic valve mean gradient measures 7.0 mmHg. Aortic valve peak gradient measures 10.8 mmHg. Aortic valve area, by VTI measures 2.59 cm. Pulmonic Valve: The pulmonic valve was grossly normal. Pulmonic valve regurgitation is not visualized. Aorta: The aortic root and ascending aorta are structurally normal, with no evidence of dilitation. Venous: The inferior vena cava is normal in size with greater than 50% respiratory variability, suggesting right atrial pressure of 3 mmHg. IAS/Shunts: No atrial level shunt detected by color flow Doppler. Additional Comments: 3D was performed not requiring image post processing on an independent workstation and was indeterminate.  LEFT VENTRICLE PLAX 2D LVIDd:         4.00 cm   Diastology LVIDs:         3.20 cm   LV e' medial:    4.03 cm/s LV PW:         1.30 cm   LV E/e' medial:  18.6 LV IVS:        1.30 cm   LV e' lateral:   3.37 cm/s LVOT diam:     2.10 cm   LV E/e' lateral: 22.2 LV SV:         79 LV SV Index:   36 LVOT Area:      3.46 cm  RIGHT VENTRICLE RV S prime:     13.70 cm/s TAPSE (M-mode): 2.3 cm LEFT ATRIUM             Index        RIGHT ATRIUM           Index LA diam:        3.80 cm 1.74 cm/m   RA Area:     22.20 cm LA Vol (A2C):   66.9 ml 30.71 ml/m  RA Volume:   63.40 ml  29.10 ml/m LA Vol (A4C):   52.5 ml 24.10 ml/m LA Biplane Vol: 59.4 ml 27.27 ml/m  AORTIC VALVE AV Area (Vmax):    2.56 cm AV Area (Vmean):   2.42 cm AV Area (VTI):     2.59 cm AV Vmax:           164.00 cm/s AV Vmean:          129.000 cm/s AV VTI:            0.303 m AV Peak Grad:      10.8 mmHg AV Mean Grad:      7.0 mmHg LVOT Vmax:         121.00 cm/s LVOT Vmean:        90.000 cm/s LVOT VTI:          0.227 m LVOT/AV VTI ratio: 0.75  AORTA Ao Root diam: 3.00 cm Ao Asc diam:  2.80 cm MITRAL VALVE                TRICUSPID VALVE MV Area (PHT): 3.63 cm     TR  Peak grad:   21.9 mmHg MV Decel Time: 209 msec     TR Vmax:        234.00 cm/s MV E velocity: 74.90 cm/s MV A velocity: 106.00 cm/s  SHUNTS MV E/A ratio:  0.71         Systemic VTI:  0.23 m                             Systemic Diam: 2.10 cm Jayson Sierras MD Electronically signed by Jayson Sierras MD Signature Date/Time: 08/06/2023/3:07:58 PM    Final     Cardiac Studies   Patient Profile   83 y.o. 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   Assessment & Plan  Chest pain: Patient had 1 episode of substernal chest tightness while walking in the ER.  Lasted for 10 minutes.  Did not have any recurrences.  Not have any prior episodes of chest pain either.  EKG showed NSR, LBBB, frequent PVC and PAC. Hs troponins, 15>> 29>> 26.  Echocardiogram shows normal biventricular function, no significant valvular disease.  No further cardiac workup at this time.  Can plan outpatient ischemic evaluation, likely stress PET   Dizziness/lightheadedness: Multifactorial, sepsis  versus frequent PVCs.  Telemetry reviewed, has NSR, LBBB, frequent PVCs and PAC.  Labs remarkable for severe hypokalemia, K2.8. Keep K>4 and <5, Mg>2 and <3.  Obtain 2-week event monitor upon discharge.  Echocardiogram unremarkable as above.   Frequent PVCs/PACs: Likely secondary to severe hypokalemia. Keep K>4 and <5, Mg>2 and <3.  Obtain 2-week event monitor upon discharge.  Echo unremarkable as above   Severe hypokalemia, K2.8 on presentation.  Has improved  [facility] will sign off.   Medication Recommendations: No changes Other recommendations (labs, testing, etc): Plan outpatient Zio patch and ischemic evaluation as outpatient Follow up as an outpatient: Will schedule follow-up   For questions or updates, please contact [facility] Please consult [email or web address] for contact info under     Signed, Lonni LITTIE Nanas, MD  08/07/2023, 10:51 AM

## 2023-08-07 NOTE — Plan of Care (Signed)

## 2023-08-07 NOTE — Progress Notes (Signed)
 [facility] Progress Note   Subjective: Seen in room. No C/Os.      Objective Vitals:   08/06/23 2029 08/07/23 0110 08/07/23 0520 08/07/23 0829  BP: 114/62 133/66 (!) 146/66 (!) 146/66  Pulse: 92 83 83   Resp: 20 16 20 17   Temp: 97.7 F (36.5 C) 98 F (36.7 C) 98.3 F (36.8 C) 97.6 F (36.4 C)  TempSrc: Tympanic Oral Oral Oral  SpO2: 100% 100% 99% 96%  Weight:        Additional Objective Labs: Basic Metabolic Panel: Recent Labs  Lab 08/05/23 1548 08/06/23 0228 08/06/23 1748 08/07/23 0313  NA 141 139 141 142  K 3.8 3.2* 4.2 3.6  CL 107 108 107 109  CO2 22 22 22 22   GLUCOSE 257* 97 181* 92  BUN 48* 49* 51* 49*  CREATININE 3.90* 4.19* 4.54* 4.75*  CALCIUM 8.9 8.9 9.1 9.1  PHOS 3.1 3.1 3.3  --    Liver Function Tests: Recent Labs  Lab 08/06/23 0228 08/06/23 1748  ALBUMIN 3.0* 2.7*   No results for input(s): LIPASE, AMYLASE in the last 168 hours. CBC: Recent Labs  Lab 08/04/23 1730 08/05/23 0456 08/06/23 0228  WBC 4.7 5.1 5.2  NEUTROABS 2.6  --   --   HGB 11.3* 11.8* 12.4*  HCT 34.2* 38.2* 38.8*  MCV 85.7 87.0 86.0  PLT 243 233 237   Blood Culture    Component Value Date/Time   SDES  08/04/2023 1959    URINE, CLEAN CATCH Performed at [facility], [address] [place], [place] [address]    [facility]  08/04/2023 1959    NONE Performed at [facility], [address], [place], [place] [address]    CULT (A) 08/04/2023 1959    >=100,000 COLONIES/mL ESCHERICHIA COLI REPEATING SUSCEPTIBILITY Performed at [facility], [address] [place], [place] [address]    REPTSTATUS PENDING 08/04/2023 1959    Cardiac Enzymes: No results for input(s): CKTOTAL, CKMB, CKMBINDEX, TROPONINI in the last 168 hours. CBG: Recent Labs  Lab 08/06/23 1150 08/06/23 1603 08/06/23 2115 08/07/23 0824 08/07/23 0903  GLUCAP 121* 199* 113* 59* 77   Iron Studies: No results for input(s): IRON, TIBC, TRANSFERRIN,  FERRITIN in the last 72 hours. @lablastinr3 @ Studies/Results: ECHOCARDIOGRAM COMPLETE Result Date: 08/06/2023    ECHOCARDIOGRAM REPORT   Patient Name:   AMARIE TARTE Date of Exam: 08/06/2023 Medical Rec #:  [id number]    Height:       72.0 in Accession #:    [id number]   Weight:       210.8 lb Date of Birth:  [birth year]    BSA:          2.179 m Patient Age:    83 years     BP:           129/59 mmHg Patient Gender: M            HR:           83 bpm. Exam Location:  Inpatient Procedure: 2D Echo, Color Doppler and Cardiac Doppler (Both Spectral and Color            Flow Doppler were utilized during procedure). Indications:    R07.9* Chest pain, unspecified  History:        Patient has prior history of Echocardiogram examinations, most                 recent 06/26/2023. Risk Factors:Hypertension, Diabetes,  Dyslipidemia and Sleep Apnea.  Sonographer:    Damien Senior RDCS Referring Phys: [id number] RIPUDEEP K RAI IMPRESSIONS  1. Left ventricular ejection fraction, by estimation, is 55 to 60%. The left ventricle has normal function. The left ventricle has no regional wall motion abnormalities. There is moderate concentric left ventricular hypertrophy. Left ventricular diastolic parameters are indeterminate.  2. Right ventricular systolic function is normal. The right ventricular size is normal. There is normal pulmonary artery systolic pressure. The estimated right ventricular systolic pressure is 24.9 mmHg.  3. A small pericardial effusion is present. The pericardial effusion is circumferential. There is no evidence of cardiac tamponade.  4. The mitral valve is grossly normal. Mild mitral valve regurgitation.  5. The aortic valve is tricuspid. Aortic valve regurgitation is trivial. No aortic stenosis is present. Aortic valve mean gradient measures 7.0 mmHg.  6. The inferior vena cava is normal in size with greater than 50% respiratory variability, suggesting right atrial pressure of 3 mmHg. Comparison(s): Prior  images reviewed side by side. LVEF 55-60%, septal motion constent with LBBB. Estimated RVSP now normal. Mild mitral and tricuspid regurgitation. Small pericardial effusion. FINDINGS  Left Ventricle: Left ventricular ejection fraction, by estimation, is 55 to 60%. The left ventricle has normal function. The left ventricle has no regional wall motion abnormalities. The left ventricular internal cavity size was normal in size. There is  moderate concentric left ventricular hypertrophy. Abnormal (paradoxical) septal motion, consistent with left bundle branch block. Left ventricular diastolic parameters are indeterminate. Right Ventricle: The right ventricular size is normal. No increase in right ventricular wall thickness. Right ventricular systolic function is normal. There is normal pulmonary artery systolic pressure. The tricuspid regurgitant velocity is 2.34 m/s, and  with an assumed right atrial pressure of 3 mmHg, the estimated right ventricular systolic pressure is 24.9 mmHg. Left Atrium: Left atrial size was normal in size. Right Atrium: Right atrial size was normal in size. Pericardium: A small pericardial effusion is present. The pericardial effusion is circumferential. There is no evidence of cardiac tamponade. Mitral Valve: The mitral valve is grossly normal. Mild mitral valve regurgitation. Tricuspid Valve: The tricuspid valve is grossly normal. Tricuspid valve regurgitation is mild. Aortic Valve: The aortic valve is tricuspid. There is mild aortic valve annular calcification. Aortic valve regurgitation is trivial. No aortic stenosis is present. Aortic valve mean gradient measures 7.0 mmHg. Aortic valve peak gradient measures 10.8 mmHg. Aortic valve area, by VTI measures 2.59 cm. Pulmonic Valve: The pulmonic valve was grossly normal. Pulmonic valve regurgitation is not visualized. Aorta: The aortic root and ascending aorta are structurally normal, with no evidence of dilitation. Venous: The inferior vena  cava is normal in size with greater than 50% respiratory variability, suggesting right atrial pressure of 3 mmHg. IAS/Shunts: No atrial level shunt detected by color flow Doppler. Additional Comments: 3D was performed not requiring image post processing on an independent workstation and was indeterminate.  LEFT VENTRICLE PLAX 2D LVIDd:         4.00 cm   Diastology LVIDs:         3.20 cm   LV e' medial:    4.03 cm/s LV PW:         1.30 cm   LV E/e' medial:  18.6 LV IVS:        1.30 cm   LV e' lateral:   3.37 cm/s LVOT diam:     2.10 cm   LV E/e' lateral: 22.2 LV SV:  79 LV SV Index:   36 LVOT Area:     3.46 cm  RIGHT VENTRICLE RV S prime:     13.70 cm/s TAPSE (M-mode): 2.3 cm LEFT ATRIUM             Index        RIGHT ATRIUM           Index LA diam:        3.80 cm 1.74 cm/m   RA Area:     22.20 cm LA Vol (A2C):   66.9 ml 30.71 ml/m  RA Volume:   63.40 ml  29.10 ml/m LA Vol (A4C):   52.5 ml 24.10 ml/m LA Biplane Vol: 59.4 ml 27.27 ml/m  AORTIC VALVE AV Area (Vmax):    2.56 cm AV Area (Vmean):   2.42 cm AV Area (VTI):     2.59 cm AV Vmax:           164.00 cm/s AV Vmean:          129.000 cm/s AV VTI:            0.303 m AV Peak Grad:      10.8 mmHg AV Mean Grad:      7.0 mmHg LVOT Vmax:         121.00 cm/s LVOT Vmean:        90.000 cm/s LVOT VTI:          0.227 m LVOT/AV VTI ratio: 0.75  AORTA Ao Root diam: 3.00 cm Ao Asc diam:  2.80 cm MITRAL VALVE                TRICUSPID VALVE MV Area (PHT): 3.63 cm     TR Peak grad:   21.9 mmHg MV Decel Time: 209 msec     TR Vmax:        234.00 cm/s MV E velocity: 74.90 cm/s MV A velocity: 106.00 cm/s  SHUNTS MV E/A ratio:  0.71         Systemic VTI:  0.23 m                             Systemic Diam: 2.10 cm Jayson Sierras MD Electronically signed by Jayson Sierras MD Signature Date/Time: 08/06/2023/3:07:58 PM    Final    Medications:  dialysis solution 1.5% low-MG/low-CA     dialysis solution 2.5% low-MG/low-CA     dialysis solution 4.25% low-MG/low-CA      meropenem  (MERREM ) IV Stopped (08/07/23 0101)    aspirin  EC  81 mg Oral q morning   calcitRIOL   0.5 mcg Oral BID   carvedilol   3.125 mg Oral BID WC   enoxaparin  (LOVENOX ) injection  30 mg Subcutaneous Q24H   ezetimibe   10 mg Oral Daily   furosemide   40 mg Oral BID   gabapentin   100-200 mg Oral BID   gentamicin  cream  1 Application Topical Daily   hydrALAZINE   100 mg Oral BID   insulin  aspart  0-5 Units Subcutaneous QHS   insulin  aspart  0-9 Units Subcutaneous TID WC   insulin  glargine-yfgn  30 Units Subcutaneous BID   multivitamin  1 tablet Oral Daily   NIFEdipine   120 mg Oral Daily   pantoprazole   40 mg Oral BID   tamsulosin   0.4 mg Oral QHS     Physical Exam: General: Chronically ill appearing elderly male in NAD CV: S1,S2 No M/R/G PULM: CTAB A/P GI/GI: NABs, NT  LE: No LLE edema Trace R ankle edema. HD Access: PD catheter mid abdomen drsg intact   Outpatient dialysis unit: [facility] Outpatient dialysis prescription:      4 cycles, 2.5 L fill volume, usually uses 2.5% fluids  Usual wt ~ 97kg   Assessment/Recommendations: DALEN HENNESSEE is a/an 83 y.o. male with a past medical history notable for ESRD on HD admitted with UTI.    # Chest pain: Cards consulted. Troponins low, flat. Per primary.    # ESRD: Continue PD nightly with his typical prescription   # Hypokalemia: K+ 3.6 today.      # Volume/ hypertension: Appears euvolemic.  Maintain volume status with peritoneal dialysis.  Continue home blood pressure medications. On home diuretics   # Anemia of Chronic Kidney Disease: Hemoglobin at goal without ESA   # Secondary Hyperparathyroidism/Hyperphosphatemia: Continue home calcitriol .  Obtain phosphorus level   # Hypokalemia: Repeat level now.  Replace as needed   # UTI: History of ESBL E. coli.  Antibiotics per primary team.  Symptoms were very severe.  No dysuria and was having urinary frequency.  Given recurrent nature and also questionable symptoms may  want to involve urology and/or infectious disease to determine if further antibiotics are needed.  Ongoing resistance would be a concern     # Additional recommendations: - Dose all meds for creatinine clearance < 10 ml/min  - Unless absolutely necessary, no MRIs with gadolinium.  - Implement save arm precautions.  Prefer needle sticks in the dorsum of the hands or wrists.  No blood pressure measurements in arm. - If blood transfusion is requested during hemodialysis sessions, please alert us  prior to the session.  - Use synthetic opioids (Fentanyl /Dilaudid ) if needed   Brenley Priore H. Obrien Huskins NP-C 08/07/2023, 11:44 AM  [employer] [phone]

## 2023-08-08 ENCOUNTER — Inpatient Hospital Stay

## 2023-08-08 DIAGNOSIS — R42 Dizziness and giddiness: Secondary | ICD-10-CM | POA: Diagnosis not present

## 2023-08-08 DIAGNOSIS — N390 Urinary tract infection, site not specified: Secondary | ICD-10-CM | POA: Diagnosis not present

## 2023-08-08 DIAGNOSIS — I493 Ventricular premature depolarization: Secondary | ICD-10-CM

## 2023-08-08 DIAGNOSIS — E1142 Type 2 diabetes mellitus with diabetic polyneuropathy: Secondary | ICD-10-CM | POA: Diagnosis not present

## 2023-08-08 LAB — BASIC METABOLIC PANEL WITH GFR
Anion gap: 12 (ref 5–15)
BUN: 46 mg/dL — ABNORMAL HIGH (ref 8–23)
CO2: 22 mmol/L (ref 22–32)
Calcium: 8.8 mg/dL — ABNORMAL LOW (ref 8.9–10.3)
Chloride: 107 mmol/L (ref 98–111)
Creatinine, Ser: 4.92 mg/dL — ABNORMAL HIGH (ref 0.61–1.24)
GFR, Estimated: 11 mL/min — ABNORMAL LOW (ref >60)
Glucose, Bld: 69 mg/dL — ABNORMAL LOW (ref 70–99)
Potassium: 3.6 mmol/L (ref 3.5–5.1)
Sodium: 141 mmol/L (ref 135–145)

## 2023-08-08 LAB — CBC
HCT: 37.1 % — ABNORMAL LOW (ref 39.0–52.0)
Hemoglobin: 11.7 g/dL — ABNORMAL LOW (ref 13.0–17.0)
MCH: 27.7 pg (ref 26.0–34.0)
MCHC: 31.5 g/dL (ref 30.0–36.0)
MCV: 87.9 fL (ref 80.0–100.0)
Platelets: 230 K/uL (ref 150–400)
RBC: 4.22 MIL/uL (ref 4.22–5.81)
RDW: 16.4 % — ABNORMAL HIGH (ref 11.5–15.5)
WBC: 5.7 K/uL (ref 4.0–10.5)
nRBC: 0 % (ref 0.0–0.2)

## 2023-08-08 LAB — GASTROINTESTINAL PANEL BY PCR, STOOL (REPLACES STOOL CULTURE)

## 2023-08-08 LAB — GLUCOSE, CAPILLARY
Glucose-Capillary: 43 mg/dL — CL (ref 70–99)
Glucose-Capillary: 101 mg/dL — ABNORMAL HIGH (ref 70–99)
Glucose-Capillary: 46 mg/dL — ABNORMAL LOW (ref 70–99)
Glucose-Capillary: 55 mg/dL — ABNORMAL LOW (ref 70–99)
Glucose-Capillary: 152 mg/dL — ABNORMAL HIGH (ref 70–99)
Glucose-Capillary: 137 mg/dL — ABNORMAL HIGH (ref 70–99)

## 2023-08-08 LAB — URINE CULTURE: Culture: >=100000 — AB

## 2023-08-08 MED ORDER — GENTAMICIN SULFATE 0.1 % EX CREA: 1.0000 | TOPICAL_CREAM | Freq: Every day | CUTANEOUS | 2 refills | Status: DC

## 2023-08-08 MED ORDER — INSULIN GLARGINE 100 UNITS/ML SOLOSTAR PEN: 25.0000 [IU] | PEN_INJECTOR | Freq: Every day | SUBCUTANEOUS | Status: AC

## 2023-08-08 MED ORDER — ONDANSETRON 4 MG PO TBDP: 4.0000 mg | ORAL_TABLET | Freq: Three times a day (TID) | ORAL | 0 refills | Status: AC | PRN

## 2023-08-08 MED ORDER — INSULIN GLARGINE-YFGN 100 UNIT/ML ~~LOC~~ SOLN
25.0000 [IU] | Freq: Every day | SUBCUTANEOUS | Status: DC
  Filled 2023-08-08: qty 0.25

## 2023-08-08 MED ORDER — NITROGLYCERIN 0.4 MG SL SUBL: 0.4000 mg | SUBLINGUAL_TABLET | SUBLINGUAL | 12 refills | Status: AC | PRN

## 2023-08-08 NOTE — Progress Notes (Signed)
 [facility] Progress Note   Subjective: Seen in room. No issues with PD overnight. Waiting to be disconnected from cycler. Eating breakfast. Denies cp, sob, n/v, abd pain. Hoping to go home soon.   Objective Vitals:   08/07/23 1905 08/07/23 1955 08/08/23 0400 08/08/23 0944  BP:  (!) 148/98 137/65 (!) 155/72  Pulse: 86 80 90 84  Resp:  16 20 16   Temp:  97.7 F (36.5 C) 98.9 F (37.2 C) 98.2 F (36.8 C)  TempSrc:  Oral Oral Oral  SpO2: 100% 96% 94% 100%  Weight:         Additional Objective Labs: Basic Metabolic Panel: Recent Labs  Lab 08/05/23 1548 08/06/23 0228 08/06/23 1748 08/07/23 0313 08/08/23 0423  NA 141 139 141 142 141  K 3.8 3.2* 4.2 3.6 3.6  CL 107 108 107 109 107  CO2 22 22 22 22 22   GLUCOSE 257* 97 181* 92 69*  BUN 48* 49* 51* 49* 46*  CREATININE 3.90* 4.19* 4.54* 4.75* 4.92*  CALCIUM 8.9 8.9 9.1 9.1 8.8*  PHOS 3.1 3.1 3.3  --   --    CBC: Recent Labs  Lab 08/04/23 1730 08/05/23 0456 08/06/23 0228 08/08/23 0423  WBC 4.7 5.1 5.2 5.7  NEUTROABS 2.6  --   --   --   HGB 11.3* 11.8* 12.4* 11.7*  HCT 34.2* 38.2* 38.8* 37.1*  MCV 85.7 87.0 86.0 87.9  PLT 243 233 237 230   Blood Culture    Component Value Date/Time   SDES  08/04/2023 1959    URINE, CLEAN CATCH Performed at [facility], [address], [place], [place] [address]    [facility]  08/04/2023 1959    NONE Performed at [facility], [address] [place], [place] [address]    CULT (A) 08/04/2023 1959    >=100,000 COLONIES/mL ESCHERICHIA COLI Confirmed Extended Spectrum Beta-Lactamase Producer (ESBL).  In bloodstream infections from ESBL organisms, carbapenems are preferred over piperacillin/tazobactam. They are shown to have a lower risk of mortality.    REPTSTATUS 08/08/2023 FINAL 08/04/2023 1959    Physical Exam General: Well appearing  Heart: RRR  Lungs: Clear bilaterally  Abdomen: non tender Extremities: Trace LE edema Dialysis Access: PD cath in place    Medications:  dialysis solution 1.5% low-MG/low-CA     dialysis solution 2.5% low-MG/low-CA     dialysis solution 4.25% low-MG/low-CA      aspirin  EC  81 mg Oral q morning   calcitRIOL   0.5 mcg Oral BID   carvedilol   3.125 mg Oral BID WC   enoxaparin  (LOVENOX ) injection  30 mg Subcutaneous Q24H   ezetimibe   10 mg Oral Daily   furosemide   40 mg Oral BID   gabapentin   100-200 mg Oral BID   gentamicin  cream  1 Application Topical Daily   hydrALAZINE   100 mg Oral BID   insulin  aspart  0-5 Units Subcutaneous QHS   insulin  aspart  0-9 Units Subcutaneous TID WC   insulin  glargine-yfgn  25 Units Subcutaneous QHS   multivitamin  1 tablet Oral Daily   NIFEdipine   120 mg Oral Daily   pantoprazole   40 mg Oral BID   tamsulosin   0.4 mg Oral QHS    Dialysis Orders:  [facility]   4 cycles, 2.5 L fill volume, usually uses 2.5% fluids  Usual wt ~ 97kg  Assessment/Plan: ESRD. Continue PD nightly with his typical prescription Chest pain. Cards consulted. Troponins low, flat. Per primary.  Hypokalemia. K 3.6. Replete prn  Volume/hypertension.  Appears euvolemic. Maintain volume status with peritoneal dialysis. Continue home blood pressure medications. On home diuretics  Anemia. Hemoglobin at goal without ESA Secondary hyperparathyroidism. Continue home calcitriol .  UTI. History of ESBL E.coli. 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

## 2023-08-08 NOTE — TOC Initial Note (Signed)
 Transition of Care [facility]) - Initial/Assessment Note    Patient Details  Name: Carlos Gill MRN: [id number] Date of Birth: Jan 04, 1941  [facility] CM/SW Contact:    Sudie Erminio Deems, RN Phone Number: 08/08/2023, 12:36 PM  Clinical Narrative:  Risk for readmission assessment completed. Patient presented for chest pain hx of ESRD on PD nightly. PTA patient was from home with spouse. Patient is a member of the [place] and [place] PCP is [place]. Case Manager did call the [facility] Fees Coordinator to make them aware of hospitalization. Awaiting confirmation call back from the [place]. Patient states he usually uses the [facility] for medications; however, has a local Pharmacy [facility] [address] . Patient will have transportation home. No home needs identified during this visit.      Expected Discharge Plan: Home/Self Care Barriers to Discharge: No Barriers Identified   Patient Goals and CMS Choice Patient states their goals for this hospitalization and ongoing recovery are:: patient plans to transition home today.   Choice offered to / list presented to : NA      Expected Discharge Plan and Services In-house Referral: NA Discharge Planning Services: CM Consult Post Acute Care Choice: NA Living arrangements for the past 2 months: Single Family Home Expected Discharge Date: 08/08/23                 Prior Living Arrangements/Services Living arrangements for the past 2 months: Single Family Home Lives with:: Spouse Patient language and need for interpreter reviewed:: Yes Do you feel safe going back to the place where you live?: Yes      Need for Family Participation in Patient Care: Yes (Comment) Care giver support system in place?: Yes (comment)   Criminal Activity/Legal Involvement Pertinent to Current Situation/Hospitalization: No - Comment as needed  Activities of Daily Living   ADL Screening (condition at time of admission) Independently  performs ADLs?: Yes (appropriate for developmental age) Is the patient deaf or have difficulty hearing?: No Does the patient have difficulty seeing, even when wearing glasses/contacts?: No Does the patient have difficulty concentrating, remembering, or making decisions?: No  Permission Sought/Granted Permission sought to share information with : Case Manager, Magazine features editor Permission granted to share information with : Yes, Verbal Permission Granted     Permission granted to share info w AGENCY: [facility] Fees Coordinator        Emotional Assessment Appearance:: Appears stated age Attitude/Demeanor/Rapport: Engaged Affect (typically observed): Appropriate Orientation: : Oriented to  Time, Oriented to Place, Oriented to Self, Oriented to Situation Alcohol  / Substance Use: Not Applicable Psych Involvement: No (comment)  Admission diagnosis:  Lightheadedness [R42] Lower urinary tract infection, acute [N39.0] Chest pain [R07.9] Patient Active Problem List   Diagnosis Date Noted   Lightheadedness 08/06/2023   Hypokalemia 08/06/2023   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   Chest pain 08/05/2023   ESRD on peritoneal dialysis (HCC) 08/05/2023   Acute lower UTI 08/05/2023   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/05/2023   BPH (benign prostatic hyperplasia) 08/05/2023   ESBL (extended spectrum beta-lactamase) producing bacteria infection 06/26/2023   Bacteremia 06/26/2023   Sepsis (HCC) 06/24/2023   Severe sepsis (HCC) 05/28/2023   UTI (urinary tract infection) 05/27/2023   Sepsis secondary to UTI (HCC) 05/26/2023   Generalized weakness 10/17/2022   Anemia of chronic disease 10/17/2022   Elevated brain natriuretic peptide (BNP) level 10/17/2022   Chronic kidney disease, stage V (HCC) 10/17/2022   Mixed hyperlipidemia  10/17/2022   Hypoalbuminemia due to protein-calorie malnutrition (HCC) 10/17/2022   OSA on CPAP 10/17/2022   Elevated troponin  10/17/2022   Peripheral neuropathy 10/17/2022   Hyperglycemia due to diabetes mellitus (HCC) 11/09/2021   Epididymitis 11/09/2021   Chronic kidney disease, stage 4 (severe) (HCC) 11/09/2021   Sepsis due to gram-negative UTI (HCC) 11/02/2021   Acute pyelonephritis 11/02/2021   Essential hypertension 11/02/2021   GERD without esophagitis 11/02/2021   Dyslipidemia 11/02/2021   Renal mass, right 11/02/2021   Benign prostatic hyperplasia with urinary obstruction 04/21/2021   Acute kidney failure, unspecified (HCC) 04/21/2021   Shortness of breath 04/21/2021   Other chest pain 04/21/2021   Macular degeneration 04/21/2021   Enlarged prostate 04/21/2021   Bilateral pseudophakia 04/21/2021   Acute on chronic diastolic (congestive) heart failure (HCC) 04/21/2021   Abnormality of plasma protein, unspecified 04/21/2021   Abnormal result of other cardiovascular function study 04/21/2021   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   Rhinitis, chronic 03/09/2021   Proteinuria 01/21/2021   Nonexudative age-related macular degeneration 04/19/2018   Adverse effect of antihyperlipidemic and antiarteriosclerotic drugs, initial encounter 12/19/2017   Trochanteric bursitis of right hip 04/27/2017   Obesity (BMI 30-39.9) 04/11/2017   Fatigue 12/23/2015   Chronic anemia 08/15/2015   Uncontrolled type 2 diabetes mellitus with hyperglycemia, with long-term current use of insulin  (HCC) 11/05/2010   Osteoarthritis 11/11/2008   PCP:  [name], MD Pharmacy:   [facility] [phone] - [place], [place] - [address] [place] [address] Phone: [phone] Fax: [phone]  [facility] [address] [place] [address] Phone: [phone] Fax: [phone]     Social Drivers of Health (SDOH) Social History: SDOH Screenings   Food Insecurity: No Food Insecurity (08/05/2023)  Housing: Low Risk   (08/05/2023)  Transportation Needs: No Transportation Needs (08/05/2023)  Utilities: Not At Risk (08/05/2023)  Financial Resource Strain: Low Risk  (03/07/2023)   Received from [facility]  Social Connections: Socially Integrated (08/05/2023)  Stress: No Stress Concern Present (11/10/2021)   Received from [facility]  Tobacco Use: Medium Risk (08/04/2023)   SDOH Interventions:     Readmission Risk Interventions    08/08/2023   12:34 PM 07/01/2023   11:15 AM 05/30/2023    3:21 PM  Readmission Risk Prevention Plan  Transportation Screening Complete Complete Complete  PCP or Specialist Appt within 3-5 Days  Complete Complete  HRI or Home Care Consult  Complete Complete  Social Work Consult for Recovery Care Planning/Counseling  Complete Complete  Palliative Care Screening  Not Applicable Not Applicable  Medication Review Oceanographer) Referral to Pharmacy Referral to Pharmacy Referral to Pharmacy  PCP or Specialist appointment within 3-5 days of discharge Complete    HRI or Home Care Consult Complete    SW Recovery Care/Counseling Consult Complete    Palliative Care Screening Not Applicable    Skilled Nursing Facility Not Applicable

## 2023-08-08 NOTE — Progress Notes (Signed)
 Discharge instructions (including medications) discussed with and copy provided to patient, patient verbalized understanding.  Primary RN stated patient needs to eat lunch prior to discharge, assisted with ordering lunch. Patient called his grandson to pick him up and is on the way. Patient given dressing supplies for several dressing changes at home.

## 2023-08-08 NOTE — Discharge Summary (Addendum)
 Physician Discharge Summary   Patient: Carlos Gill MRN: [id number] DOB: [birth year]  Admit date:     08/04/2023  Discharge date: 08/08/23  Discharge Physician: Nydia Distance, MD    PCP: [name], MD   Recommendations at discharge:   Per ID, Dr. Eben history of ESBL E. coli, felt to be colonized and does not need antibiotics, likely will remain colonized.  Repeat urine culture only if he is severely symptomatic, fevers/chills, leukocytosis, flank/abdominal pain, dysuria/hematuria. Please adjust insulin  regimen, decrease Lantus  to 25 units at bedtime due to hypoglycemia inpatient with twice daily dosing  Discharge Diagnoses:    Chest pain ESBL E. 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  Patient reported chest pain on exertion and upon ambulation in the ER, no radiation, no nausea vomiting or diaphoresis.  Reported urinary frequency, urgency, no dysuria, hematuria or flank pain.  No dyspnea or cough or wheezing.   In ED, creatinine 4.73, troponin 15-> 29-> 26, chest x-ray showed stable cardiomegaly with mild bibasilar atelectasis.  UA positive for UTI Patient was given 1 g IV meropenem , nephrology was consulted and recommended transfer to [facility] for further evaluation given peritoneal dialysis.   Assessment and Plan:  Chest pain - Atypical, 1 episode of substernal chest tightness while ambulating, lasted about 10 minutes, no further recurrence. -Seen by  cardiology, troponins have improved, recommended no further cardiac workup at this time.  If he develops any recurrent exertional chest pain, will benefit from ischemia evaluation - Chest pain has currently resolved - 2D echo 06/2023 that showed EF of 50 to 55%, paradoxical septal motion consistent with LBBB, G1DD - Cardiology has signed off, plan outpatient Zio patch and ischemic evaluation outpatient.  Cardiology appointment is scheduled.   Dizziness, lightheadedness - Possibly from UTI, sepsis, PVCs - Seen by cardiology, recommended to correct electrolytes, 2-week event monitor upon discharge,  - 2D echo showed EF of 55 to 60%, no regional WMA, indeterminate diastolic parameters   Frequent PVCs, PACs - Improved, likely due to severe hypokalemia - Cardiology will arrange Zio patch, 2D echo showed EF 55 to 60%, no regional WMA, indeterminate diastolic parameters.   ESRD on peritoneal dialysis [facility]) -Nephrology was consulted for peritoneal dialysis.   - on home diuretics   Acute lower UTI -Patient with prior history of ESBL UTI - Urine culture shows E. coli, currently on IV meropenem  -Infectious disease consulted, seen by Dr. Eben, recommended to stop the IV meropenem , this is colonization.  Recommended to repeat urine culture only if he is symptomatic, leukocytosis, fever chills flank pain or lower abdominal pain.     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     BPH (benign prostatic hyperplasia) -Continue treatment with Flomax      Hypokalemia - Replaced   Estimated body mass  index is 28.58 kg/m as calculated from the following:   Height as of 06/24/23: 6' (1.829 m).   Weight as of this encounter: 95.6 kg.      Pain control - [place]  Controlled Substance Reporting System database was reviewed. and patient was instructed, not to drive, operate heavy machinery, perform activities at heights, swimming or participation in water activities or provide baby-sitting services while on Pain, Sleep and Anxiety Medications; until their outpatient Physician has advised to do so again. Also recommended to not to take more than prescribed Pain, Sleep and Anxiety Medications.  Consultants: Nephrology, cardiology, ID Procedures performed: 2D echo Disposition: Home Diet recommendation:  Discharge Diet Orders (From admission, onward)     Start     Ordered   08/08/23 0000  Diet Carb Modified        08/08/23 1100            DISCHARGE MEDICATION: Allergies as of 08/08/2023       Reactions   Statins Swelling, Other (See Comments)   Muscle aches   Wound Dressing Adhesive Dermatitis   Pt has rash on L arm from tape used with stitches        Medication List     TAKE these medications    acetaminophen  500 MG tablet Commonly known as: TYLENOL  Take 1,000 mg by mouth daily as needed for moderate pain.   aspirin  EC 81 MG tablet Take 81 mg by mouth every morning.   calcitRIOL  0.5 MCG capsule Commonly known as: ROCALTROL  Take 0.5 mcg by mouth 2 (two) times daily.   carvedilol  3.125 MG tablet Commonly known as: COREG  Take 3.125 mg by mouth 2 (two) times daily with a meal.   epoetin alfa 4000 UNIT/ML injection Commonly known as: EPOGEN Inject 4,000 Units into the vein every 30 (thirty) days.   ezetimibe  10 MG tablet Commonly known as: ZETIA  Take 10 mg by mouth daily.   FreeStyle Libre 2 Sensor Misc Inject 1 Device into the skin every 14 (fourteen) days.   furosemide  40 MG tablet Commonly known as: LASIX  Take 40 mg by mouth 2 (two) times daily.    gabapentin  100 MG capsule Commonly known as: NEURONTIN  Take 100-200 mg by mouth 2 (two) times daily. Take 100 mg in the morning and 200 mg at bedtime   gentamicin  cream 0.1 % Commonly known as: GARAMYCIN  Apply 1 Application topically daily. Apply to exit site. Start taking on: August 09, 2023   hydrALAZINE  100 MG tablet Commonly known as: APRESOLINE  Take 100 mg by mouth 2 (two) times daily.   insulin  aspart 100 UNIT/ML injection Commonly known as: novoLOG  Inject 6-18 Units into the skin 3 (three) times daily with meals. Per sliding scale, 1 UNIT FOR EVERY 10 GRAMS CARBOHYDRATES, CORRECTION   insulin  glargine 100 unit/mL Sopn Commonly known as: LANTUS  Inject 25 Units into the skin at bedtime. What changed:  how much to take when to take this   NIFEdipine  60 MG 24 hr tablet Commonly known as: ADALAT  CC Take 120 mg by mouth daily.   nitroGLYCERIN  0.4 MG SL tablet Commonly known as: NITROSTAT  Place 1 tablet (0.4 mg total) under the tongue every 5 (five) minutes as needed for chest pain.   omeprazole 20 MG capsule Commonly known as: PRILOSEC Take 20 mg by mouth daily.  ondansetron  4 MG disintegrating tablet Commonly known as: ZOFRAN -ODT Take 1 tablet (4 mg total) by mouth every 8 (eight) hours as needed for nausea or vomiting.   PreserVision AREDS 2 Caps Take 1 capsule by mouth 2 (two) times daily.   tamsulosin  0.4 MG Caps capsule Commonly known as: FLOMAX  Take 0.4 mg by mouth at bedtime.               Discharge Care Instructions  (From admission, onward)           Start     Ordered   08/08/23 0000  Discharge wound care:       Comments: Exit site care:  Comments: Clean skin near exit site with chloraprep swab sticks.  Starting at catheter, use circular pattern around exit site, moving towards outer edges of area covered by dressing.  Apply gentamicin  cream to site once daily.  Cover with dry dressing.   08/08/23 1100            Follow-up Information      [name], MD. Schedule an appointment as soon as possible for a visit in 2 week(s).   Specialty: Internal Medicine Why: for hospital follow-up Contact information: [address] [place] [address] [phone]         Johnson Laymon HERO, PA-C Follow up on 09/13/2023.   Specialties: Cardiology, Cardiology Why: at 3:30PM, for hospital follow-up Contact information: [address] [place] [address] [phone]                Discharge Exam: Fredricka Weights   08/04/23 1723 08/05/23 2012  Weight: 97.5 kg 95.6 kg   S: No acute complaints, resting comfortably, plan to discharge today.  Cleared by nephrology.  BP (!) 155/72 (BP Location: Left Arm)   Pulse 83   Temp 97.8 F (36.6 C) (Oral)   Resp 18   Wt 95.6 kg   SpO2 100%   BMI 28.58 kg/m   Physical Exam General: Alert and oriented x 3, NAD Cardiovascular: S1 S2 clear, RRR.  Respiratory: CTAB, no wheezing, rales or rhonchi Gastrointestinal: Soft, nontender, nondistended, NBS, PD catheter + Ext: no pedal edema bilaterally Neuro: no new deficits Psych: Normal affect      Condition at discharge: fair  The results of significant diagnostics from this hospitalization (including imaging, microbiology, ancillary and laboratory) are listed below for reference.   Imaging Studies: ECHOCARDIOGRAM COMPLETE Result Date: 08/06/2023    ECHOCARDIOGRAM REPORT   Patient Name:   JOHNATHON OLDEN Date of Exam: 08/06/2023 Medical Rec #:  [id number]    Height:       72.0 in Accession #:    [id number]   Weight:       210.8 lb Date of Birth:  1940-06-23    BSA:          2.179 m Patient Age:    83 years     BP:           129/59 mmHg Patient Gender: M            HR:           83 bpm. Exam Location:  Inpatient Procedure: 2D Echo, Color Doppler and Cardiac Doppler (Both Spectral and Color            Flow Doppler were utilized during procedure). Indications:    R07.9* Chest pain, unspecified  History:        Patient has  prior history of Echocardiogram examinations,  most                 recent 06/26/2023. Risk Factors:Hypertension, Diabetes,                 Dyslipidemia and Sleep Apnea.  Sonographer:    Damien Senior RDCS Referring Phys: [id number] Cobern K Neeya Prigmore IMPRESSIONS  1. Left ventricular ejection fraction, by estimation, is 55 to 60%. The left ventricle has normal function. The left ventricle has no regional wall motion abnormalities. There is moderate concentric left ventricular hypertrophy. Left ventricular diastolic parameters are indeterminate.  2. Right ventricular systolic function is normal. The right ventricular size is normal. There is normal pulmonary artery systolic pressure. The estimated right ventricular systolic pressure is 24.9 mmHg.  3. A small pericardial effusion is present. The pericardial effusion is circumferential. There is no evidence of cardiac tamponade.  4. The mitral valve is grossly normal. Mild mitral valve regurgitation.  5. The aortic valve is tricuspid. Aortic valve regurgitation is trivial. No aortic stenosis is present. Aortic valve mean gradient measures 7.0 mmHg.  6. The inferior vena cava is normal in size with greater than 50% respiratory variability, suggesting right atrial pressure of 3 mmHg. Comparison(s): Prior images reviewed side by side. LVEF 55-60%, septal motion constent with LBBB. Estimated RVSP now normal. Mild mitral and tricuspid regurgitation. Small pericardial effusion. FINDINGS  Left Ventricle: Left ventricular ejection fraction, by estimation, is 55 to 60%. The left ventricle has normal function. The left ventricle has no regional wall motion abnormalities. The left ventricular internal cavity size was normal in size. There is  moderate concentric left ventricular hypertrophy. Abnormal (paradoxical) septal motion, consistent with left bundle branch block. Left ventricular diastolic parameters are indeterminate. Right Ventricle: The right ventricular size is normal. No increase  in right ventricular wall thickness. Right ventricular systolic function is normal. There is normal pulmonary artery systolic pressure. The tricuspid regurgitant velocity is 2.34 m/s, and  with an assumed right atrial pressure of 3 mmHg, the estimated right ventricular systolic pressure is 24.9 mmHg. Left Atrium: Left atrial size was normal in size. Right Atrium: Right atrial size was normal in size. Pericardium: A small pericardial effusion is present. The pericardial effusion is circumferential. There is no evidence of cardiac tamponade. Mitral Valve: The mitral valve is grossly normal. Mild mitral valve regurgitation. Tricuspid Valve: The tricuspid valve is grossly normal. Tricuspid valve regurgitation is mild. Aortic Valve: The aortic valve is tricuspid. There is mild aortic valve annular calcification. Aortic valve regurgitation is trivial. No aortic stenosis is present. Aortic valve mean gradient measures 7.0 mmHg. Aortic valve peak gradient measures 10.8 mmHg. Aortic valve area, by VTI measures 2.59 cm. Pulmonic Valve: The pulmonic valve was grossly normal. Pulmonic valve regurgitation is not visualized. Aorta: The aortic root and ascending aorta are structurally normal, with no evidence of dilitation. Venous: The inferior vena cava is normal in size with greater than 50% respiratory variability, suggesting right atrial pressure of 3 mmHg. IAS/Shunts: No atrial level shunt detected by color flow Doppler. Additional Comments: 3D was performed not requiring image post processing on an independent workstation and was indeterminate.  LEFT VENTRICLE PLAX 2D LVIDd:         4.00 cm   Diastology LVIDs:         3.20 cm   LV e' medial:    4.03 cm/s LV PW:         1.30 cm   LV E/e' medial:  18.6 LV IVS:  1.30 cm   LV e' lateral:   3.37 cm/s LVOT diam:     2.10 cm   LV E/e' lateral: 22.2 LV SV:         79 LV SV Index:   36 LVOT Area:     3.46 cm  RIGHT VENTRICLE RV S prime:     13.70 cm/s TAPSE (M-mode): 2.3 cm  LEFT ATRIUM             Index        RIGHT ATRIUM           Index LA diam:        3.80 cm 1.74 cm/m   RA Area:     22.20 cm LA Vol (A2C):   66.9 ml 30.71 ml/m  RA Volume:   63.40 ml  29.10 ml/m LA Vol (A4C):   52.5 ml 24.10 ml/m LA Biplane Vol: 59.4 ml 27.27 ml/m  AORTIC VALVE AV Area (Vmax):    2.56 cm AV Area (Vmean):   2.42 cm AV Area (VTI):     2.59 cm AV Vmax:           164.00 cm/s AV Vmean:          129.000 cm/s AV VTI:            0.303 m AV Peak Grad:      10.8 mmHg AV Mean Grad:      7.0 mmHg LVOT Vmax:         121.00 cm/s LVOT Vmean:        90.000 cm/s LVOT VTI:          0.227 m LVOT/AV VTI ratio: 0.75  AORTA Ao Root diam: 3.00 cm Ao Asc diam:  2.80 cm MITRAL VALVE                TRICUSPID VALVE MV Area (PHT): 3.63 cm     TR Peak grad:   21.9 mmHg MV Decel Time: 209 msec     TR Vmax:        234.00 cm/s MV E velocity: 74.90 cm/s MV A velocity: 106.00 cm/s  SHUNTS MV E/A ratio:  0.71         Systemic VTI:  0.23 m                             Systemic Diam: 2.10 cm Jayson Sierras MD Electronically signed by Jayson Sierras MD Signature Date/Time: 08/06/2023/3:07:58 PM    Final    DG Chest Port 1 View Result Date: 08/04/2023 CLINICAL DATA:  Weakness. EXAM: PORTABLE CHEST 1 VIEW COMPARISON:  06/24/2023 FINDINGS: Stable cardiomegaly. Mediastinal contours are unchanged. Mild bibasilar atelectasis. No pulmonary edema. No pleural fluid. No pneumothorax. Grossly stable osseous structures. IMPRESSION: Stable cardiomegaly. Mild bibasilar atelectasis. Electronically Signed   By: Andrea Gasman M.D.   On: 08/04/2023 18:59    Microbiology: Results for orders placed or performed during the hospital encounter of 08/04/23  Urine Culture     Status: Abnormal   Collection Time: 08/04/23  7:59 PM   Specimen: Urine, Clean Catch  Result Value Ref Range Status   Specimen Description   Final    URINE, CLEAN CATCH Performed at [facility], [address], [place], [place] [address]    Special Requests    Final    NONE Performed at [facility], [address], [place], [place] [address]    Culture (A)  Final    >=100,000 COLONIES/mL ESCHERICHIA COLI Confirmed Extended Spectrum Beta-Lactamase Producer (ESBL).  In bloodstream infections from ESBL organisms, carbapenems are preferred over piperacillin/tazobactam. They are shown to have a lower risk of mortality.    Report Status 08/08/2023 FINAL  Final   Organism ID, Bacteria ESCHERICHIA COLI (A)  Final      Susceptibility   Escherichia coli - MIC*    AMPICILLIN >=32 RESISTANT Resistant     CEFAZOLIN  >=64 RESISTANT Resistant     CEFEPIME 16 RESISTANT Resistant     CEFTRIAXONE  >=64 RESISTANT Resistant     CIPROFLOXACIN  1 RESISTANT Resistant     GENTAMICIN  >=16 RESISTANT Resistant     IMIPENEM 0.5 SENSITIVE Sensitive     NITROFURANTOIN 64 INTERMEDIATE Intermediate     TRIMETH/SULFA >=320 RESISTANT Resistant     AMPICILLIN/SULBACTAM >=32 RESISTANT Resistant     PIP/TAZO 8 SENSITIVE Sensitive ug/mL    * >=100,000 COLONIES/mL ESCHERICHIA COLI  C Difficile Quick Screen w PCR reflex     Status: None   Collection Time: 08/06/23  2:27 PM   Specimen: STOOL  Result Value Ref Range Status   C Diff antigen NEGATIVE NEGATIVE Final   C Diff toxin NEGATIVE NEGATIVE Final   C Diff interpretation No C. difficile detected.  Final    Comment: Performed at [facility], [address] [place], [place] [address]    Labs: CBC: Recent Labs  Lab 08/04/23 1730 08/05/23 0456 08/06/23 0228 08/08/23 0423  WBC 4.7 5.1 5.2 5.7  NEUTROABS 2.6  --   --   --   HGB 11.3* 11.8* 12.4* 11.7*  HCT 34.2* 38.2* 38.8* 37.1*  MCV 85.7 87.0 86.0 87.9  PLT 243 233 237 230   Basic Metabolic Panel: Recent Labs  Lab 08/05/23 1548 08/06/23 0228 08/06/23 1748 08/07/23 0313 08/08/23 0423  NA 141 139 141 142 141  K 3.8 3.2* 4.2 3.6 3.6  CL 107 108 107 109 107  CO2 22 22 22 22 22   GLUCOSE 257* 97 181* 92 69*  BUN 48* 49* 51* 49* 46*  CREATININE 3.90*  4.19* 4.54* 4.75* 4.92*  CALCIUM 8.9 8.9 9.1 9.1 8.8*  MG  --  1.7  --  2.3  --   PHOS 3.1 3.1 3.3  --   --    Liver Function Tests: Recent Labs  Lab 08/06/23 0228 08/06/23 1748  ALBUMIN 3.0* 2.7*   CBG: Recent Labs  Lab 08/08/23 0401 08/08/23 0434 08/08/23 0859 08/08/23 0925 08/08/23 0958  GLUCAP 43* 101* 46* 55* 152*    Discharge time spent: greater than 30 minutes.  Signed: Nydia Distance, MD [facility] 08/08/2023

## 2023-08-08 NOTE — Progress Notes (Unsigned)
 Enrolled patient for a 14 day Zio XT monitor to be mailed to patients home  [place] to read

## 2023-08-08 NOTE — Plan of Care (Signed)

## 2023-08-08 NOTE — Progress Notes (Signed)
 PD  08/08/23 1030  Peritoneal Catheter Mid lower abdomen  No placement date or time found.   Catheter Location: Mid lower abdomen  Site Assessment Clean, Dry, Intact  Drainage Description None  Catheter status Clamped;Capped;Patent  Dressing Intervention Assessed, no intervention needed  Completion  Treatment Status Complete  Initial Drain Volume 3  Average Dwell Time-Hour(s) 2  Average Drain Time 23  Total Therapy Volume 9999  Total Therapy Time-Hour(s) 10  Total Therapy Time-Min(s) 30  Effluent Appearance Clear;Yellow  Cell Count on Daytime Exchange N/A  Procedure Comments  Tolerated treatment well? Yes  Peritoneal Dialysis Comments Pt. voice no complaints and hoping to go home today  Hand-off documentation  Hand-off Given Given to shift RN/LPN  Report given to (Full Name) Mylinda Kipper RN  Hand-off Received Received from shift RN/LPN  Report received from (Full Name) Zebedee Mace RN   PD post treatment note  PD treatment completed. Patient tolerated treatment well. PD effluent is clear. No specimen collected.  PD exit site clean, dry and intact. Patient is awake, oriented and in no acute distress.  Report given to bedside nurse.   Post treatment VS: 97.8, 155/72, 86, 18, O2 100 R/A  Total UF removed: -380   Post treatment weight: 100.7 kg

## 2023-09-13 ENCOUNTER — Ambulatory Visit: Attending: Student | Admitting: Student

## 2023-09-13 ENCOUNTER — Encounter: Payer: Self-pay | Admitting: Student

## 2023-09-13 NOTE — Progress Notes (Deleted)
 Cardiology Office Note    Date:  09/13/2023  ID:  Carlos Gill, DOB 1940-04-07, MRN [id number] Cardiologist: None { :  History of Present Illness:    Carlos Gill is a 83 y.o. male with past medical history of HTN, HLD, Type II DM, ESRD and OSA who presents to the office today for hospital follow-up.  He was most recently admitted to [facility] from 7/3 - 08/08/2023 for a UTI.  Had initially presented for evaluation of lightheadedness and dizziness which occurred while at the grocery store but did report episodes of chest tightness while in the ED. Was significantly hypokalemic upon arrival with K+ at 2.8. Hs troponin values peaked at 29 and echocardiogram showed a preserved EF of 55 to 60% with no regional wall motion normalities. RV function was normal and he did have a small pericardial effusion but no evidence of tamponade. He was noted to have frequent PAC's and PVC's which were felt to be due to his electrolyte abnormalities. An outpatient Zio patch was recommended and could arrange for outpatient ischemic evaluation. The preliminary report of his monitor shows predominantly normal sinus rhythm with an average heart rate of 83 bpm. He did have 414 runs of SVT but the longest lasted 15.2 seconds.  He had a 1.3% PAC burden and less than 1% PVC burden.  - titrate Coreg ? - stress test?  ROS: ***  Studies Reviewed:   EKG: EKG is*** ordered today and demonstrates ***   EKG Interpretation Date/Time:    Ventricular Rate:    PR Interval:    QRS Duration:    QT Interval:    QTC Calculation:   R Axis:      Text Interpretation:         Echocardiogram: 08/2023 IMPRESSIONS     1. Left ventricular ejection fraction, by estimation, is 55 to 60%. The  left ventricle has normal function. The left ventricle has no regional  wall motion abnormalities. There is moderate concentric left ventricular  hypertrophy. Left ventricular  diastolic parameters are indeterminate.   2. Right  ventricular systolic function is normal. The right ventricular  size is normal. There is normal pulmonary artery systolic pressure. The  estimated right ventricular systolic pressure is 24.9 mmHg.   3. A small pericardial effusion is present. The pericardial effusion is  circumferential. There is no evidence of cardiac tamponade.   4. The mitral valve is grossly normal. Mild mitral valve regurgitation.   5. The aortic valve is tricuspid. Aortic valve regurgitation is trivial.  No aortic stenosis is present. Aortic valve mean gradient measures 7.0  mmHg.   6. The inferior vena cava is normal in size with greater than 50%  respiratory variability, suggesting right atrial pressure of 3 mmHg.   Comparison(s): Prior images reviewed side by side. LVEF 55-60%, septal  motion constent with LBBB. Estimated RVSP now normal. Mild mitral and  tricuspid regurgitation. Small pericardial effusion.   Event Monitor: 09/2023 Patch Wear Time:  14 days and 0 hours (2025-07-11T16:15:20-0400 to 2025-07-25T16:15:20-0400)   Patient had a min HR of 60 bpm, max HR of 214 bpm, and avg HR of 83 bpm. Predominant underlying rhythm was Sinus Rhythm. Bundle Branch Block/IVCD was present. 414 Supraventricular Tachycardia runs occurred, the run with the fastest interval lasting 6  beats with a max rate of 214 bpm, the longest lasting 15.2 secs with an avg rate of 114 bpm. Isolated SVEs were occasional (1.3%, 21777), SVE Couplets were rare (<1.0%, 1329), and  SVE Triplets were rare (<1.0%, 315). Isolated VEs were rare (<1.0%), and  no VE Couplets or VE Triplets were present. Ventricular Bigeminy and Trigeminy were present. Inverted QRS complexes possibly due to inverted placement of device.  Risk Assessment/Calculations:   {Does this patient have ATRIAL FIBRILLATION?[phone]} No BP recorded.  {Refresh Note OR Click here to enter BP  :1}***         Physical Exam:   VS:  There were no vitals taken for this visit.   Wt  Readings from Last 3 Encounters:  08/05/23 210 lb 12.2 oz (95.6 kg)  07/01/23 220 lb 6.4 oz (100 kg)  06/08/23 217 lb (98.4 kg)     GEN: Well nourished, well developed in no acute distress NECK: No JVD; No carotid bruits CARDIAC: ***RRR, no murmurs, rubs, gallops RESPIRATORY:  Clear to auscultation without rales, wheezing or rhonchi  ABDOMEN: Appears non-distended. No obvious abdominal masses. EXTREMITIES: No clubbing or cyanosis. No edema.  Distal pedal pulses are 2+ bilaterally.   Assessment and Plan:   1. Chest pain of uncertain etiology ***  2. SVT - Recent monitor as outlined above showed brief episodes of SVT with the longest lasting for 15 seconds and frequent PACs with a 1.3% burden PVCs with a less than 1% burden.  3. ESRD on peritoneal dialysis (HCC) ***  4. Hyperlipidemia, unspecified hyperlipidemia type - Followed by his PCP.  FLP last month showed his LDL was at 120.  He has been intolerant to statins given myalgias and remains on Zetia  10 mg daily.  5. HTN - BP is at *** during today's visit.  Continue current medical therapy with Coreg  3.125 mg twice daily, Hydralazine  100 mg twice daily and Nifedipine  120 mg daily.  Signed, Laymon CHRISTELLA Qua, PA-C

## 2023-09-27 DIAGNOSIS — I493 Ventricular premature depolarization: Secondary | ICD-10-CM | POA: Diagnosis not present

## 2023-09-27 DIAGNOSIS — R42 Dizziness and giddiness: Secondary | ICD-10-CM

## 2023-10-13 ENCOUNTER — Ambulatory Visit: Attending: Physician Assistant | Admitting: Physician Assistant

## 2023-10-13 ENCOUNTER — Encounter: Payer: Self-pay | Admitting: Physician Assistant

## 2023-10-13 VITALS — BP 134/64 | HR 77 | Ht 72.0 in | Wt 222.4 lb

## 2023-10-13 DIAGNOSIS — I471 Supraventricular tachycardia, unspecified: Secondary | ICD-10-CM

## 2023-10-13 DIAGNOSIS — I491 Atrial premature depolarization: Secondary | ICD-10-CM | POA: Diagnosis not present

## 2023-10-13 DIAGNOSIS — R7989 Other specified abnormal findings of blood chemistry: Secondary | ICD-10-CM

## 2023-10-13 DIAGNOSIS — I493 Ventricular premature depolarization: Secondary | ICD-10-CM

## 2023-10-13 DIAGNOSIS — E785 Hyperlipidemia, unspecified: Secondary | ICD-10-CM

## 2023-10-13 DIAGNOSIS — Z992 Dependence on renal dialysis: Secondary | ICD-10-CM

## 2023-10-13 DIAGNOSIS — E876 Hypokalemia: Secondary | ICD-10-CM

## 2023-10-13 DIAGNOSIS — N186 End stage renal disease: Secondary | ICD-10-CM

## 2023-10-13 NOTE — Patient Instructions (Signed)
 Medication Instructions:  Your physician recommends that you continue on your current medications as directed. Please refer to the Current Medication list given to you today.  Please call with complete medication list.  [facility] [phone].   *If you need a refill on your cardiac medications before your next appointment, please call your pharmacy*  Lab Work: NONE   If you have labs (blood work) drawn today and your tests are completely normal, you will receive your results only by: MyChart Message (if you have MyChart) OR A paper copy in the mail If you have any lab test that is abnormal or we need to change your treatment, we will call you to review the results.  Testing/Procedures: NONE    Follow-Up: At [facility], you and your health needs are our priority.  As part of our continuing mission to provide you with exceptional heart care, our providers are all part of one team.  This team includes your primary Cardiologist (physician) and Advanced Practice Providers or APPs (Physician Assistants and Nurse Practitioners) who all work together to provide you with the care you need, when you need it.  Your next appointment:   6 week(s)  Provider:   You will see one of the following Advanced Practice Providers on your designated Care Team:   [place], PA-C  [place], [place] Olivia Pavy, [place]    We recommend signing up for the patient portal called MyChart.  Sign up information is provided on this After Visit Summary.  MyChart is used to connect with patients for Virtual Visits (Telemedicine).  Patients are able to view lab/test results, encounter notes, upcoming appointments, etc.  Non-urgent messages can be sent to your provider as well.   To learn more about what you can do with MyChart, go to [email or web address].   Other Instructions Thank you for choosing [facility]!

## 2023-10-13 NOTE — Progress Notes (Addendum)
 Cardiology Office Note    Date:  10/13/2023  ID:  Carlos Gill, DOB 12/09/1940, MRN [id number] PCP:  [name], MD  Cardiologist:  Diannah SHAUNNA [place], MD  Electrophysiologist:  None   Chief Complaint: f/u monitor  History of Present Illness: .    Carlos Gill is a 83 y.o. male with visit-pertinent history of HTN, HLD (lipid mgmt per PCP), Type II DM, ESRD on PD 4 days per week, PVCs, PACs, OSA, LBBB who presents to the office today for hospital follow-up.  He was most recently admitted to [facility] from 7/3 - 08/08/2023 for a UTI. Had initially presented for evaluation of lightheadedness and dizziness which occurred while at the grocery store but did report episodes of chest tightness while in the ED. Was significantly hypokalemic upon arrival with K+ at 2.8. Hs troponin values peaked at 29 and echocardiogram showed a preserved EF of 55 to 60% with no regional wall motion normalities.RV function was normal and he did have a small pericardial effusion but no evidence of tamponade. He was noted to have frequent PAC's and PVC's which were felt to be due to his electrolyte abnormalities.An outpatient Zio patch was recommended and could arrange for outpatient ischemic evaluation. The preliminary report of his monitor shows predominantly normal sinus rhythm with an average heart rate of 83 bpm. He did have 414 runs of SVT but the longest lasted 15.2 seconds.  He had a 1.3% PAC burden and less than 1% PVC burden.  He returns for follow-up overall doing well. Unfortunately it is very challenging to discern what he is actually taking. He brings in a visit summary from the [facility] from earlier this month with a medication list that is different than what he is taking here. Our list includes carvedilol  3.125mg  BID, for example. [facility] list [facility]  12.5mg  daily. The patient reports he is taking sporadic dosing of potassium but hasn't in several weeks. He recalls being told by Dr. Rachele recently to  stop Lasix  and switch to another medicine - we confirmed with his pharmacy that Dr. Rachele had sent in spironolactone. He continues to have episodic chest pressure without specific provocation about 2-3x a week lasting a few minutes, resolving spontaneously, not clearly associated with palpitations. Has not been aware of his ectopy or SVT. Denies any issues with hypotension with HD. Reports that the [facility] set him up for stress test in October.   Labwork independently reviewed: 08/2023 K 3.6, Cr 4.92, Hgb 11.7, plt 230, Mg OK, LDL 120, trig 106 12/2022 TSH wnl  ROS: .    Please see the history of present illness. All other systems are reviewed and otherwise negative.  Studies Reviewed: SABRA    EKG:  EKG is ordered today, personally reviewed, demonstrating:  EKG Interpretation Date/Time:  Thursday October 13 2023 15:46:48 EDT Ventricular Rate:  76 PR Interval:  140 QRS Duration:  134 QT Interval:  430 QTC Calculation: 483 R Axis:   217  Text Interpretation: Normal sinus rhythm Right superior axis deviation LBBB with nonspecific STTW changes Cannot rule out Anteroseptal infarct , age undetermined When compared with ECG of 04-Aug-2023 21:39, PREVIOUS ECG IS PRESENT Confirmed by Donnabelle Blanchard [phone]) on 10/13/2023 4:04:09 PM    CV Studies: Cardiac studies reviewed are outlined and summarized above. Otherwise please see EMR for full report.   Current Reported Medications:.    Current Meds  Medication Sig   acetaminophen  (TYLENOL ) 500 MG tablet Take 1,000 mg by mouth  daily as needed for moderate pain.   aspirin  EC 81 MG tablet Take 81 mg by mouth every morning.   calcitRIOL  (ROCALTROL ) 0.5 MCG capsule Take 0.5 mcg by mouth 2 (two) times daily.   carvedilol  (COREG ) 3.125 MG tablet Take 3.125 mg by mouth 2 (two) times daily with a meal.   ciprofloxacin  (CIPRO ) 500 MG tablet Take 500 mg by mouth 2 (two) times daily.   Continuous Glucose Sensor (FREESTYLE LIBRE 2 SENSOR) MISC Inject 1 Device  into the skin every 14 (fourteen) days.   epoetin alfa (EPOGEN) 4000 UNIT/ML injection Inject 4,000 Units into the vein every 30 (thirty) days.   ezetimibe  (ZETIA ) 10 MG tablet Take 10 mg by mouth daily.   gabapentin  (NEURONTIN ) 100 MG capsule Take 100-200 mg by mouth 2 (two) times daily. Take 100 mg in the morning and 200 mg at bedtime   gentamicin  cream (GARAMYCIN ) 0.1 % Apply 1 Application topically daily. Apply to exit site.   hydrALAZINE  (APRESOLINE ) 100 MG tablet Take 100 mg by mouth 2 (two) times daily.   insulin  aspart (NOVOLOG ) 100 UNIT/ML injection Inject 6-18 Units into the skin 3 (three) times daily with meals. Per sliding scale, 1 UNIT FOR EVERY 10 GRAMS CARBOHYDRATES, CORRECTION   insulin  glargine (LANTUS ) 100 unit/mL SOPN Inject 25 Units into the skin at bedtime.   Multiple Vitamins-Minerals (PRESERVISION AREDS 2) CAPS Take 1 capsule by mouth 2 (two) times daily.   NIFEdipine  (ADALAT  CC) 60 MG 24 hr tablet Take 120 mg by mouth daily.   nitroGLYCERIN  (NITROSTAT ) 0.4 MG SL tablet Place 1 tablet (0.4 mg total) under the tongue every 5 (five) minutes as needed for chest pain.   omeprazole (PRILOSEC) 20 MG capsule Take 20 mg by mouth daily.   ondansetron  (ZOFRAN -ODT) 4 MG disintegrating tablet Take 1 tablet (4 mg total) by mouth every 8 (eight) hours as needed for nausea or vomiting.   potassium chloride  SA (KLOR-CON  M20) 20 MEQ tablet Take 20 mEq by mouth.   rosuvastatin (CRESTOR) 20 MG tablet Take 20 mg by mouth once a week.   tamsulosin  (FLOMAX ) 0.4 MG CAPS capsule Take 0.4 mg by mouth at bedtime.    Physical Exam:    VS:  BP 134/64 (BP Location: Right Arm, Cuff Size: Normal)   Pulse 77   Ht 6' (1.829 m)   Wt 222 lb 6.4 oz (100.9 kg)   SpO2 97%   BMI 30.16 kg/m    Wt Readings from Last 3 Encounters:  10/13/23 222 lb 6.4 oz (100.9 kg)  08/05/23 210 lb 12.2 oz (95.6 kg)  07/01/23 220 lb 6.4 oz (100 kg)    GEN: Well nourished, well developed in no acute distress NECK: No  JVD; No carotid bruits CARDIAC: RRR, no murmurs, rubs, gallops RESPIRATORY:  Clear to auscultation without rales, wheezing or rhonchi  ABDOMEN: Soft, non-tender, non-distended EXTREMITIES:  No edema; No acute deformity   Asessement and Plan:.    1. Elevated troponin - discussed further workup with patient. He reports he was already set up with a stress test at the [facility] in October. I asked him to please have them send us  a copy of the report. Continue ASA 81mg  daily. Otherwise see below regarding med rec.  2. Frequent PACs, PVCs, PSVT - monitor reviewed with patient. PAC/PVC burden was low but had a fair amount of short runs of SVT. He is possibly on nifedipine  and carvedilol . I would ideally like to titrate the beta blocker. However, it is  not actually really clear what the patient is actively taking since [facility] our list here, and he is unsure of several of his medications. We have asked him to get all of his medicine bottles that he is actively taking and call us  so our staff can compile a formal list.  3. Hypokalemia in setting of ESRD on PD - per call to pharmacy, patient recently started on spironolactone by Dr. Rachele and Lasix  stopped. He reports he was on a potassium supplement that he previously took whenever he had muscle cramps but hasn't taken in weeks. He was advised to avoid this and follow Dr. Juanito recommendations for treatment and follow-up. Will defer further lyte mgmt to nephrology as this is managed in the context of his ongoing peritoneal dialysis.  4. HLD - given h/o ESRD and age will defer ongoing lipid mgmt to PCP.     Disposition: F/u with APP in 6 weeks to f/u stress test and potential response to beta blocker titration (when we are able to confirm med rec).  Signed, Kloe Oates N Bassy Fetterly, PA-C

## 2023-10-14 ENCOUNTER — Ambulatory Visit: Payer: Self-pay | Admitting: Internal Medicine

## 2023-10-14 ENCOUNTER — Telehealth: Payer: Self-pay | Admitting: Physician Assistant

## 2023-10-14 ENCOUNTER — Encounter: Payer: Self-pay | Admitting: Physician Assistant

## 2023-10-14 NOTE — Telephone Encounter (Signed)
 Patient says during appointment yesterday he was advised to call back to review his medications. He would like a call back to discuss.

## 2023-10-14 NOTE — Telephone Encounter (Signed)
 Error

## 2023-10-18 ENCOUNTER — Telehealth: Payer: Self-pay | Admitting: Internal Medicine

## 2023-10-18 NOTE — Telephone Encounter (Signed)
 Pt c/o medication issue:  1. Name of Medication:   NIFEdipine  (ADALAT  CC) 60 MG 24 hr tablet  (2 tablets per day) spironolactone (ALDACTONE) 25 MG tablet  (1 tablet per day) Multiple Vitamins-Minerals (PRESERVISION AREDS 2) CAPS  (2 tablets per day) rosuvastatin (CRESTOR) 20 MG tablet  (1 tablet per week) gabapentin  (NEURONTIN ) 100 MG capsule  (3 tablets per day) tamsulosin  (FLOMAX ) 0.4 MG CAPS capsule  (1 tablet per day) hydrALAZINE  (APRESOLINE ) 100 MG tablet  (3 tablets per day) ezetimibe  (ZETIA ) 10 MG tablet  (1 tablet per day) aspirin  EC 81 MG tablet  (1 tablet per day) calcitRIOL  (ROCALTROL ) 0.5 MCG capsule  (2 tablets per day) Potassium, 20 mg, 1 tablet daily for 5 days carvedilol  (COREG ) 3.125 MG tablet  (25 mg, 1/2 tablet per day) omeprazole (PRILOSEC) 20 MG capsule  (1 tablet per day) insulin  aspart (NOVOLOG ) 100 UNIT/ML injection  Lantus  - injection   2. How are you currently taking this medication (dosage and times per day)?   See above  3. Are you having a reaction (difficulty breathing--STAT)?   4. What is your medication issue?   Patient called to report medications he is taking as requested.

## 2023-10-18 NOTE — Telephone Encounter (Signed)
 Patient called with updated medication list- please see phone note for 9/16.   Medication list sent to provider as requested.

## 2023-10-18 NOTE — Telephone Encounter (Signed)
 Left a message for patient to call office back regarding medication list.

## 2023-10-19 MED ORDER — CARVEDILOL 25 MG PO TABS: 12.5000 mg | ORAL_TABLET | Freq: Two times a day (BID) | ORAL | 3 refills | Status: DC

## 2023-10-19 NOTE — Telephone Encounter (Signed)
 Spoke to patient and received a more detailed list of medications/sigs:  NIFEdipine  (ADALAT  CC) 60 MG 24 hr tablet  60mg  - 1 tablet by mouth  twice daily (1-am;1-pm) spironolactone (ALDACTONE) 25 MG tablet  25 mg 1 tablet by mouth daily  Multiple Vitamins-Minerals (PRESERVISION AREDS 2) CAPS  (2 tablets per day) rosuvastatin (CRESTOR) 20 MG tablet  20 mg- 1 tablet by mouth once weekly  gabapentin  (NEURONTIN ) 100 MG capsule  100 mg- 1 tablet by mouth 3 times daily (1-am;2-pm) tamsulosin  (FLOMAX ) 0.4 MG CAPS capsule  0.4 mg- 1 tablet by mouth at bedtime hydrALAZINE  (APRESOLINE ) 100 MG tablet  100 mg- 1 tablet by mouth 3 times daily ezetimibe  (ZETIA ) 10 MG tablet  10 mg- 1 tablet by mouth once daily  aspirin  EC 81 MG tablet 81 mg- 1 tablet by mouth in the morning  calcitRIOL  (ROCALTROL ) 0.5 MCG capsule  0.5 mcg- 2 tablets by mouth daily  carvedilol  (COREG ) 3.125 MG tablet - patient stated that Carvedilol  was increased to 12.5 mg once daily- 25 mg- take 0.5 tablet by mouth  once daily  omeprazole (PRILOSEC) 20 MG capsule  20 mg- 1 tablet by mouth once daily  insulin  aspart (NOVOLOG ) 100 UNIT/ML injection Inject 6-18 Units into the skin 3 (three) times daily with meals. Per sliding scale, 1 UNIT FOR EVERY 10 GRAMS CARBOHYDRATES, CORRECTION  insulin  glargine (LANTUS ) 100 unit/mL SOPN  Inject 25 Units into the skin at bedtime.  nitroGLYCERIN  (NITROSTAT ) 0.4 MG SL tablet Place 1 tablet (0.4 mg total) under the tongue every 5 (five) minutes as needed for chest pain.   Patient stated he is no longer taking: -Potassium -Robaxin -Zofran   Patient stated that he has a bottle of Furosemide  at home and is not sure if he should be taking medication as he thought that Spironolactone replaced Furosemide , so he has not been taking it. Can you please clarify this as well?    Please advise.

## 2023-10-19 NOTE — Telephone Encounter (Signed)
 Thank you so much for clarifying. This is so helpful. Let's change his carvedilol  to 12.5mg  *twice a day*, but notify if there are any concerns about low blood pressure going forward.  Regarding Lasix  and spironolactone, he needs to call his nephrologist to discuss the plan for these medicines since he is on dialysis and he reported that his nephrologist was the one who started the spironolactone. He should also discuss with them when they plan to recheck his labwork.

## 2023-10-19 NOTE — Telephone Encounter (Signed)
 Spoke to patient and verbalized medication updates. Patient voiced understanding to change Coreg  (Carvedilol ) to 12.5 mg BID. Patients medication list updated to reflect changes and a new RX was sent to patients pharmacy.  Regarding Furosemide /Spironolactone, patient was advised to speak with nephrologist concerning these medications. Patient voiced understanding and stated that he would call nephrology office for clarification on medications. Patient had no further questions or concerns at this time.

## 2023-10-26 ENCOUNTER — Ambulatory Visit: Admitting: Student

## 2023-11-25 ENCOUNTER — Ambulatory Visit: Attending: Student | Admitting: Student

## 2023-11-25 NOTE — Progress Notes (Deleted)
 Cardiology Office Note    Date:  11/25/2023  ID:  DASH CARDARELLI, DOB 1940/02/12, MRN [id number] Cardiologist: Diannah SHAUNNA Maywood, MD { :  History of Present Illness:    Carlos Gill is a 83 y.o. male with past medical history of HTN, HLD, Type II DM, ESRD and OSA who presents to the office today for 103-month follow-up.   He was last examined by Raphael Bring, PA on 10/13/2023 for hospital follow-up from recent admission for UTI and presyncope.  He had worn a monitor following hospital discharge which had shown predominantly normal sinus rhythm with 414 runs of SVT with the longest lasting for 15.2 seconds and a PAC burden of 1.3% and PVC burden of 1%.  At the time of his visit, he reported overall feeling well.  It was unclear which medications he was taking at that time as he was listed as being on Coreg  3.125 mg twice daily but it was from the [facility] had him on 12.5 mg daily.  He did report having intermittent episodes of chest pressure and reported the [facility] had already arranged for him to have a stress test in 11/2023.  It was recommended titrate beta-blocker therapy given his SVT and palpitations once his dosing was confirmed.  Carvedilol  was ultimately titrated to 12.5 mg twice daily.   ROS: ***  Studies Reviewed:   EKG: EKG is*** ordered today and demonstrates ***   EKG Interpretation Date/Time:    Ventricular Rate:    PR Interval:    QRS Duration:    QT Interval:    QTC Calculation:   R Axis:      Text Interpretation:         Echocardiogram: 08/2023 IMPRESSIONS     1. Left ventricular ejection fraction, by estimation, is 55 to 60%. The  left ventricle has normal function. The left ventricle has no regional  wall motion abnormalities. There is moderate concentric left ventricular  hypertrophy. Left ventricular  diastolic parameters are indeterminate.   2. Right ventricular systolic function is normal. The right ventricular  size is normal. There is normal pulmonary artery  systolic pressure. The  estimated right ventricular systolic pressure is 24.9 mmHg.   3. A small pericardial effusion is present. The pericardial effusion is  circumferential. There is no evidence of cardiac tamponade.   4. The mitral valve is grossly normal. Mild mitral valve regurgitation.   5. The aortic valve is tricuspid. Aortic valve regurgitation is trivial.  No aortic stenosis is present. Aortic valve mean gradient measures 7.0  mmHg.   6. The inferior vena cava is normal in size with greater than 50%  respiratory variability, suggesting right atrial pressure of 3 mmHg.   Comparison(s): Prior images reviewed side by side. LVEF 55-60%, septal  motion constent with LBBB. Estimated RVSP now normal. Mild mitral and  tricuspid regurgitation. Small pericardial effusion.   Event Monitor: 09/2023   Patch wear time was for 14 days.   Normal sinus rhythm predominantly ranging from 60 to 214 bpm with average HR 83 bpm.   414 runs of nonsustained SVT occurred, fastest interval lasting 6 beats and the longest interval lasting 15.2 seconds.   No evidence of atrial fibrillation/flutter, ventricular arrhythmias, high-grade AV block or pauses.   1.3% PAC burden and <1% PVC burden.   Patient triggered events correlate with NSR (72 to 91 bpm), PAC and PVC.  Risk Assessment/Calculations:   {Does this patient have ATRIAL FIBRILLATION?[phone]} No BP recorded.  {Refresh Note  OR Click here to enter BP  :1}***         Physical Exam:   VS:  There were no vitals taken for this visit.   Wt Readings from Last 3 Encounters:  10/13/23 222 lb 6.4 oz (100.9 kg)  08/05/23 210 lb 12.2 oz (95.6 kg)  07/01/23 220 lb 6.4 oz (100 kg)     GEN: Well nourished, well developed in no acute distress NECK: No JVD; No carotid bruits CARDIAC: ***RRR, no murmurs, rubs, gallops RESPIRATORY:  Clear to auscultation without rales, wheezing or rhonchi  ABDOMEN: Appears non-distended. No obvious abdominal  masses. EXTREMITIES: No clubbing or cyanosis. No edema.  Distal pedal pulses are 2+ bilaterally.   Assessment and Plan:   1. Chest pain of uncertain etiology/History of Elevated Troponin Values ***   2. SVT - Recent monitor as outlined above showed brief episodes of SVT with the longest lasting for 15 seconds and frequent PACs with a 1.3% burden and PVCs with a less than 1% burden.   3. ESRD on peritoneal dialysis (HCC) ***   4. Hyperlipidemia, unspecified hyperlipidemia type - Followed by his PCP.  FLP last month showed his LDL was at 120.  He has been intolerant to statins given myalgias and remains on Zetia  10 mg daily.   5. HTN - BP is at *** during today's visit.  Continue current medical therapy with ***.    Signed, Laymon CHRISTELLA Qua, PA-C

## 2023-12-07 NOTE — Progress Notes (Unsigned)
 Cardiology Office Note    Date:  12/08/2023  ID:  KOL CONSUEGRA, DOB 27-Sep-1940, MRN [id number] Cardiologist: Diannah SHAUNNA Maywood, MD { :  History of Present Illness:    Carlos Gill is a 83 y.o. male with past medical history of HTN, HLD, Type II DM, ESRD and OSA who presents to the office today for 27-month follow-up.   He was last examined by Raphael Bring, PA on 10/13/2023 for hospital follow-up from recent admission for UTI and presyncope. He had worn a monitor following hospital discharge which had shown predominantly normal sinus rhythm with 414 runs of SVT with the longest lasting for 15.2 seconds and a PAC burden of 1.3% and PVC burden of 1%. At the time of his visit, he reported overall feeling well. It was unclear which medications he was taking at that time as he was listed as being on Coreg  3.125 mg twice daily but it was from the [facility] had him on 12.5 mg daily. He did report having intermittent episodes of chest pressure and reported the [facility] had already arranged for him to have a stress test in 11/2023. It was recommended to titrate beta-blocker therapy given his SVT and palpitations once his dosing was confirmed. Carvedilol  was ultimately titrated to 12.5 mg twice daily.  In talking with the patient today, he reports things have been stable since his last office visit. He reports still having occasional, brief episodes of chest pain but no progression in severity of symptoms. He did have a stress test last week at the [facility] but is unaware of the results. No recent orthopnea, PND or pitting edema. Reports his palpitations have significantly improved since his recent hospitalization. Does experience episodic dizziness at times but is unsure if this is related to the days that he completes peritoneal dialysis. He completes this at home 4 nights per week.  Studies Reviewed:   EKG: EKG is not ordered today.  Echocardiogram: 08/2023 IMPRESSIONS     1. Left ventricular ejection  fraction, by estimation, is 55 to 60%. The  left ventricle has normal function. The left ventricle has no regional  wall motion abnormalities. There is moderate concentric left ventricular  hypertrophy. Left ventricular  diastolic parameters are indeterminate.   2. Right ventricular systolic function is normal. The right ventricular  size is normal. There is normal pulmonary artery systolic pressure. The  estimated right ventricular systolic pressure is 24.9 mmHg.   3. A small pericardial effusion is present. The pericardial effusion is  circumferential. There is no evidence of cardiac tamponade.   4. The mitral valve is grossly normal. Mild mitral valve regurgitation.   5. The aortic valve is tricuspid. Aortic valve regurgitation is trivial.  No aortic stenosis is present. Aortic valve mean gradient measures 7.0  mmHg.   6. The inferior vena cava is normal in size with greater than 50%  respiratory variability, suggesting right atrial pressure of 3 mmHg.   Comparison(s): Prior images reviewed side by side. LVEF 55-60%, septal  motion constent with LBBB. Estimated RVSP now normal. Mild mitral and  tricuspid regurgitation. Small pericardial effusion.   Event Monitor: 09/2023   Patch wear time was for 14 days.   Normal sinus rhythm predominantly ranging from 60 to 214 bpm with average HR 83 bpm.   414 runs of nonsustained SVT occurred, fastest interval lasting 6 beats and the longest interval lasting 15.2 seconds.   No evidence of atrial fibrillation/flutter, ventricular arrhythmias, high-grade AV block or pauses.  1.3% PAC burden and <1% PVC burden.   Patient triggered events correlate with NSR (72 to 91 bpm), PAC and PVC.   Physical Exam:   VS:  BP (!) 136/58 (BP Location: Right Arm, Patient Position: Sitting, Cuff Size: Large)   Pulse 68   Ht 6' (1.829 m)   Wt 208 lb 3.2 oz (94.4 kg)   SpO2 98%   BMI 28.24 kg/m    Wt Readings from Last 3 Encounters:  12/08/23 208 lb 3.2 oz  (94.4 kg)  10/13/23 222 lb 6.4 oz (100.9 kg)  08/05/23 210 lb 12.2 oz (95.6 kg)     GEN: Pleasant, elderly male appearing in no acute distress NECK: No JVD; No carotid bruits CARDIAC: RRR, no murmurs, rubs, gallops RESPIRATORY:  Clear to auscultation without rales, wheezing or rhonchi  ABDOMEN: Appears non-distended. No obvious abdominal masses. EXTREMITIES: No clubbing or cyanosis. No pitting edema.  Distal pedal pulses are 2+ bilaterally.   Assessment and Plan:   1. Chest pain of uncertain etiology/History of Elevated Troponin Values - It was previously recommended to consider stress testing following his hospitalization in 08/2023. He reports having a stress test at the [facility] last week but records are unavailable for review. Will ask for a copy of these to be sent to the office.  He reports also having follow-up with Cardiology at the [facility] later this month. - Continue current medical therapy for now with ASA 81 mg daily, Coreg  12.5 mg twice daily, Crestor 20 mg once weekly and Zetia  10 mg daily.   2. SVT - Recent monitor as outlined above showed brief episodes of SVT with the longest lasting for 15 seconds and frequent PAC's with a 1.3% burden and PVC's with a less than 1% burden. - He reports symptoms have overall been well-controlled. Continue Coreg  12.5 mg twice daily.   3. ESRD on peritoneal dialysis [facility]) - Followed by Nephrology. Reports his dry weight has been unchanged. He still produces some urine and remains on Lasix  80mg  BID and Spironolactone 25 mg daily. Will defer continuation of Lasix  and Spironolactone to Nephrology.   4. Hyperlipidemia, unspecified hyperlipidemia type - Followed by his PCP. FLP in 08/2023 showed his LDL was at 120. He was previously intolerant to daily statin therapy and remains on Crestor 20 mg once weekly along with Zetia  10 mg daily.   5. HTN - BP is at 136/58 during today's visit. Continue current medical therapy with Coreg  12.5 mg twice  daily, Hydralazine  100 mg twice daily, Nifedipine  120 mg daily and Spironolactone 25 mg daily.    Signed, Laymon CHRISTELLA Qua, PA-C

## 2023-12-08 ENCOUNTER — Ambulatory Visit: Attending: Student | Admitting: Student

## 2023-12-08 ENCOUNTER — Encounter: Payer: Self-pay | Admitting: Student

## 2023-12-08 ENCOUNTER — Encounter: Payer: Self-pay | Admitting: *Deleted

## 2023-12-08 VITALS — BP 136/58 | HR 68 | Ht 72.0 in | Wt 208.2 lb

## 2023-12-08 DIAGNOSIS — I471 Supraventricular tachycardia, unspecified: Secondary | ICD-10-CM

## 2023-12-08 DIAGNOSIS — Z992 Dependence on renal dialysis: Secondary | ICD-10-CM

## 2023-12-08 DIAGNOSIS — R079 Chest pain, unspecified: Secondary | ICD-10-CM | POA: Diagnosis not present

## 2023-12-08 DIAGNOSIS — E785 Hyperlipidemia, unspecified: Secondary | ICD-10-CM

## 2023-12-08 DIAGNOSIS — I10 Essential (primary) hypertension: Secondary | ICD-10-CM

## 2023-12-08 DIAGNOSIS — N186 End stage renal disease: Secondary | ICD-10-CM

## 2023-12-08 NOTE — Patient Instructions (Signed)
 Medication Instructions:  Your physician recommends that you continue on your current medications as directed. Please refer to the Current Medication list given to you today.  *If you need a refill on your cardiac medications before your next appointment, please call your pharmacy*  Lab Work: NONE   If you have labs (blood work) drawn today and your tests are completely normal, you will receive your results only by: MyChart Message (if you have MyChart) OR A paper copy in the mail If you have any lab test that is abnormal or we need to change your treatment, we will call you to review the results.  Testing/Procedures: NONE   Follow-Up: At [facility], you and your health needs are our priority.  As part of our continuing mission to provide you with exceptional heart care, our providers are all part of one team.  This team includes your primary Cardiologist (physician) and Advanced Practice Providers or APPs (Physician Assistants and Nurse Practitioners) who all work together to provide you with the care you need, when you need it.  Your next appointment:   6 month(s)  Provider:   You may see Vishnu P Mallipeddi, MD or one of the following Advanced Practice Providers on your designated Care Team:   [place], PA-C  Scotesia He[place], [place] Theotis Flake, [place]     We recommend signing up for the patient portal called "MyChart".  Sign up information is provided on this After Visit Summary.  MyChart is used to connect with patients for Virtual Visits (Telemedicine).  Patients are able to view lab/test results, encounter notes, upcoming appointments, etc.  Non-urgent messages can be sent to your provider as well.   To learn more about what you can do with MyChart, go to [email or web address].   Other Instructions Thank you for choosing [facility]!

## 2024-01-05 ENCOUNTER — Emergency Department (HOSPITAL_COMMUNITY)

## 2024-01-05 ENCOUNTER — Other Ambulatory Visit: Payer: Self-pay

## 2024-01-05 ENCOUNTER — Encounter (HOSPITAL_COMMUNITY): Payer: Self-pay | Admitting: Internal Medicine

## 2024-01-05 ENCOUNTER — Observation Stay (HOSPITAL_COMMUNITY)
Admission: EM | Admit: 2024-01-05 | Discharge: 2024-01-07 | Disposition: A | Source: Ambulatory Visit | Attending: Emergency Medicine | Admitting: Emergency Medicine

## 2024-01-05 DIAGNOSIS — Z743 Need for continuous supervision: Secondary | ICD-10-CM | POA: Diagnosis not present

## 2024-01-05 DIAGNOSIS — D638 Anemia in other chronic diseases classified elsewhere: Secondary | ICD-10-CM | POA: Insufficient documentation

## 2024-01-05 DIAGNOSIS — Z87891 Personal history of nicotine dependence: Secondary | ICD-10-CM | POA: Insufficient documentation

## 2024-01-05 DIAGNOSIS — K219 Gastro-esophageal reflux disease without esophagitis: Secondary | ICD-10-CM | POA: Insufficient documentation

## 2024-01-05 DIAGNOSIS — E785 Hyperlipidemia, unspecified: Secondary | ICD-10-CM | POA: Insufficient documentation

## 2024-01-05 DIAGNOSIS — D631 Anemia in chronic kidney disease: Secondary | ICD-10-CM | POA: Insufficient documentation

## 2024-01-05 DIAGNOSIS — R531 Weakness: Secondary | ICD-10-CM | POA: Diagnosis not present

## 2024-01-05 DIAGNOSIS — E1165 Type 2 diabetes mellitus with hyperglycemia: Secondary | ICD-10-CM | POA: Diagnosis present

## 2024-01-05 DIAGNOSIS — I447 Left bundle-branch block, unspecified: Principal | ICD-10-CM | POA: Insufficient documentation

## 2024-01-05 DIAGNOSIS — I10 Essential (primary) hypertension: Secondary | ICD-10-CM | POA: Diagnosis present

## 2024-01-05 DIAGNOSIS — I5033 Acute on chronic diastolic (congestive) heart failure: Secondary | ICD-10-CM | POA: Diagnosis present

## 2024-01-05 DIAGNOSIS — I132 Hypertensive heart and chronic kidney disease with heart failure and with stage 5 chronic kidney disease, or end stage renal disease: Secondary | ICD-10-CM | POA: Insufficient documentation

## 2024-01-05 DIAGNOSIS — I249 Acute ischemic heart disease, unspecified: Secondary | ICD-10-CM

## 2024-01-05 DIAGNOSIS — N186 End stage renal disease: Secondary | ICD-10-CM | POA: Insufficient documentation

## 2024-01-05 DIAGNOSIS — N400 Enlarged prostate without lower urinary tract symptoms: Secondary | ICD-10-CM | POA: Insufficient documentation

## 2024-01-05 DIAGNOSIS — Z794 Long term (current) use of insulin: Secondary | ICD-10-CM | POA: Insufficient documentation

## 2024-01-05 DIAGNOSIS — E1122 Type 2 diabetes mellitus with diabetic chronic kidney disease: Secondary | ICD-10-CM | POA: Insufficient documentation

## 2024-01-05 DIAGNOSIS — Z992 Dependence on renal dialysis: Secondary | ICD-10-CM | POA: Insufficient documentation

## 2024-01-05 DIAGNOSIS — R079 Chest pain, unspecified: Principal | ICD-10-CM | POA: Diagnosis present

## 2024-01-05 DIAGNOSIS — Z79899 Other long term (current) drug therapy: Secondary | ICD-10-CM | POA: Insufficient documentation

## 2024-01-05 DIAGNOSIS — Z7982 Long term (current) use of aspirin: Secondary | ICD-10-CM | POA: Insufficient documentation

## 2024-01-05 DIAGNOSIS — G4733 Obstructive sleep apnea (adult) (pediatric): Secondary | ICD-10-CM

## 2024-01-05 LAB — CBC WITH DIFFERENTIAL/PLATELET
Abs Immature Granulocytes: 0.04 K/uL (ref 0.00–0.07)
Basophils Absolute: 0 K/uL (ref 0.0–0.1)
Basophils Relative: 0 %
Eosinophils Absolute: 0.2 K/uL (ref 0.0–0.5)
Eosinophils Relative: 2 %
HCT: 27.1 % — ABNORMAL LOW (ref 39.0–52.0)
Hemoglobin: 8.8 g/dL — ABNORMAL LOW (ref 13.0–17.0)
Immature Granulocytes: 1 %
Lymphocytes Relative: 18 %
Lymphs Abs: 1.2 K/uL (ref 0.7–4.0)
MCH: 27.4 pg (ref 26.0–34.0)
MCHC: 32.5 g/dL (ref 30.0–36.0)
MCV: 84.4 fL (ref 80.0–100.0)
Monocytes Absolute: 0.6 K/uL (ref 0.1–1.0)
Monocytes Relative: 10 %
Neutro Abs: 4.5 K/uL (ref 1.7–7.7)
Neutrophils Relative %: 69 %
Platelets: 187 K/uL (ref 150–400)
RBC: 3.21 MIL/uL — ABNORMAL LOW (ref 4.22–5.81)
RDW: 17.8 % — ABNORMAL HIGH (ref 11.5–15.5)
WBC: 6.5 K/uL (ref 4.0–10.5)
nRBC: 0 % (ref 0.0–0.2)

## 2024-01-05 LAB — COMPREHENSIVE METABOLIC PANEL WITH GFR
ALT: 12 U/L (ref 0–44)
AST: 19 U/L (ref 15–41)
Albumin: 3.9 g/dL (ref 3.5–5.0)
Alkaline Phosphatase: 69 U/L (ref 38–126)
Anion gap: 18 — ABNORMAL HIGH (ref 5–15)
BUN: 60 mg/dL — ABNORMAL HIGH (ref 8–23)
CO2: 19 mmol/L — ABNORMAL LOW (ref 22–32)
Calcium: 9.3 mg/dL (ref 8.9–10.3)
Chloride: 105 mmol/L (ref 98–111)
Creatinine, Ser: 5.44 mg/dL — ABNORMAL HIGH (ref 0.61–1.24)
GFR, Estimated: 10 mL/min — ABNORMAL LOW (ref >60)
Glucose, Bld: 170 mg/dL — ABNORMAL HIGH (ref 70–99)
Potassium: 3.5 mmol/L (ref 3.5–5.1)
Sodium: 141 mmol/L (ref 135–145)
Total Bilirubin: 0.3 mg/dL (ref 0.0–1.2)
Total Protein: 6.9 g/dL (ref 6.5–8.1)

## 2024-01-05 LAB — TROPONIN T, HIGH SENSITIVITY
Troponin T High Sensitivity: 96 ng/L — ABNORMAL HIGH (ref 0–19)
Troponin T High Sensitivity: 93 ng/L — ABNORMAL HIGH (ref 0–19)

## 2024-01-05 LAB — GLUCOSE, CAPILLARY: Glucose-Capillary: 227 mg/dL — ABNORMAL HIGH (ref 70–99)

## 2024-01-05 LAB — HEMOGLOBIN A1C
Hgb A1c MFr Bld: 8.7 % — ABNORMAL HIGH (ref 4.8–5.6)
Mean Plasma Glucose: 202.99 mg/dL

## 2024-01-05 LAB — PRO BRAIN NATRIURETIC PEPTIDE: Pro Brain Natriuretic Peptide: 2302 pg/mL — ABNORMAL HIGH (ref <300.0)

## 2024-01-05 MED ORDER — ASPIRIN 81 MG PO CHEW
324.0000 mg | CHEWABLE_TABLET | Freq: Once | ORAL | Status: AC
  Administered 2024-01-05: 324 mg via ORAL
  Filled 2024-01-05: qty 4

## 2024-01-05 MED ORDER — ACETAMINOPHEN 325 MG PO TABS: 650.0000 mg | ORAL_TABLET | ORAL | Status: DC | PRN

## 2024-01-05 MED ORDER — GEMFIBROZIL 600 MG PO TABS
600.0000 mg | ORAL_TABLET | Freq: Two times a day (BID) | ORAL | Status: DC
  Filled 2024-01-05: qty 1

## 2024-01-05 MED ORDER — INSULIN GLARGINE-YFGN 100 UNIT/ML ~~LOC~~ SOLN
10.0000 [IU] | Freq: Every day | SUBCUTANEOUS | Status: DC
  Administered 2024-01-06 – 2024-01-07 (×2): 10 [IU] via SUBCUTANEOUS
  Filled 2024-01-05 (×2): qty 0.1

## 2024-01-05 MED ORDER — ONDANSETRON HCL 4 MG/2ML IJ SOLN: 4.0000 mg | Freq: Four times a day (QID) | INTRAMUSCULAR | Status: DC | PRN

## 2024-01-05 MED ORDER — ASPIRIN 81 MG PO TBEC
81.0000 mg | DELAYED_RELEASE_TABLET | Freq: Every morning | ORAL | Status: DC
  Administered 2024-01-07: 81 mg via ORAL
  Filled 2024-01-05 (×2): qty 1

## 2024-01-05 MED ORDER — ROSUVASTATIN CALCIUM 20 MG PO TABS: 20.0000 mg | ORAL_TABLET | ORAL | Status: DC

## 2024-01-05 MED ORDER — INSULIN ASPART 100 UNIT/ML IJ SOLN
0.0000 [IU] | Freq: Every day | INTRAMUSCULAR | Status: DC
  Administered 2024-01-05 – 2024-01-06 (×2): 2 [IU] via SUBCUTANEOUS
  Filled 2024-01-05 (×2): qty 2

## 2024-01-05 MED ORDER — GABAPENTIN 100 MG PO CAPS: 100.0000 mg | ORAL_CAPSULE | Freq: Two times a day (BID) | ORAL | Status: DC

## 2024-01-05 MED ORDER — ASPIRIN 81 MG PO CHEW
81.0000 mg | CHEWABLE_TABLET | ORAL | Status: AC
  Administered 2024-01-06: 81 mg via ORAL
  Filled 2024-01-05: qty 1

## 2024-01-05 MED ORDER — CARVEDILOL 12.5 MG PO TABS
12.5000 mg | ORAL_TABLET | Freq: Two times a day (BID) | ORAL | Status: DC
  Administered 2024-01-05: 12.5 mg via ORAL
  Filled 2024-01-05 (×2): qty 1

## 2024-01-05 MED ORDER — TAMSULOSIN HCL 0.4 MG PO CAPS
0.4000 mg | ORAL_CAPSULE | Freq: Every day | ORAL | Status: DC
  Administered 2024-01-05 – 2024-01-06 (×2): 0.4 mg via ORAL
  Filled 2024-01-05 (×2): qty 1

## 2024-01-05 MED ORDER — HEPARIN SODIUM (PORCINE) 5000 UNIT/ML IJ SOLN
5000.0000 [IU] | Freq: Three times a day (TID) | INTRAMUSCULAR | Status: DC
  Administered 2024-01-05 – 2024-01-07 (×5): 5000 [IU] via SUBCUTANEOUS
  Filled 2024-01-05 (×5): qty 1

## 2024-01-05 MED ORDER — EZETIMIBE 10 MG PO TABS
10.0000 mg | ORAL_TABLET | Freq: Every day | ORAL | Status: DC
  Administered 2024-01-06 – 2024-01-07 (×2): 10 mg via ORAL
  Filled 2024-01-05 (×2): qty 1

## 2024-01-05 MED ORDER — PANTOPRAZOLE SODIUM 40 MG PO TBEC
40.0000 mg | DELAYED_RELEASE_TABLET | Freq: Every day | ORAL | Status: DC
  Administered 2024-01-06 – 2024-01-07 (×2): 40 mg via ORAL
  Filled 2024-01-05 (×2): qty 1

## 2024-01-05 MED ORDER — GABAPENTIN 100 MG PO CAPS
200.0000 mg | ORAL_CAPSULE | Freq: Every day | ORAL | Status: DC
  Administered 2024-01-05 – 2024-01-06 (×2): 200 mg via ORAL
  Filled 2024-01-05 (×2): qty 2

## 2024-01-05 MED ORDER — GABAPENTIN 100 MG PO CAPS
100.0000 mg | ORAL_CAPSULE | Freq: Every morning | ORAL | Status: DC
  Administered 2024-01-06 – 2024-01-07 (×2): 100 mg via ORAL
  Filled 2024-01-05 (×2): qty 1

## 2024-01-05 MED ORDER — SODIUM CHLORIDE 0.9 % IV SOLN: INTRAVENOUS | Status: DC

## 2024-01-05 MED ORDER — FUROSEMIDE 40 MG PO TABS
80.0000 mg | ORAL_TABLET | Freq: Two times a day (BID) | ORAL | Status: DC
  Administered 2024-01-06 – 2024-01-07 (×3): 80 mg via ORAL
  Filled 2024-01-05 (×3): qty 2

## 2024-01-05 MED ORDER — HYDRALAZINE HCL 50 MG PO TABS
100.0000 mg | ORAL_TABLET | Freq: Three times a day (TID) | ORAL | Status: DC
  Administered 2024-01-05 – 2024-01-07 (×5): 100 mg via ORAL
  Filled 2024-01-05 (×5): qty 2

## 2024-01-05 MED ORDER — NITROGLYCERIN 0.4 MG SL SUBL: 0.4000 mg | SUBLINGUAL_TABLET | SUBLINGUAL | Status: DC | PRN

## 2024-01-05 MED ORDER — INSULIN GLARGINE-YFGN 100 UNIT/ML ~~LOC~~ SOLN
10.0000 [IU] | Freq: Every day | SUBCUTANEOUS | Status: DC
  Filled 2024-01-05: qty 0.1

## 2024-01-05 MED ORDER — SPIRONOLACTONE 25 MG PO TABS
25.0000 mg | ORAL_TABLET | Freq: Every day | ORAL | Status: DC
  Administered 2024-01-06 – 2024-01-07 (×2): 25 mg via ORAL
  Filled 2024-01-05 (×2): qty 1

## 2024-01-05 MED ORDER — NIFEDIPINE ER OSMOTIC RELEASE 60 MG PO TB24
120.0000 mg | ORAL_TABLET | Freq: Every day | ORAL | Status: DC
  Administered 2024-01-06 – 2024-01-07 (×2): 120 mg via ORAL
  Filled 2024-01-05 (×2): qty 2

## 2024-01-05 MED ORDER — INSULIN ASPART 100 UNIT/ML IJ SOLN
0.0000 [IU] | Freq: Three times a day (TID) | INTRAMUSCULAR | Status: DC
  Administered 2024-01-06: 1 [IU] via SUBCUTANEOUS
  Filled 2024-01-05: qty 1

## 2024-01-05 NOTE — H&P (Signed)
 [facility] H&P   Patient Demographics:    Carlos Gill, is a 83 y.o. male  MRN: [id number]   DOB - [birth year]  Admit Date - 01/05/2024  Outpatient Primary MD for the patient is [name], MD   Chief Complaint  Patient presents with   Chest Pain      HPI:    Carlos Gill  is a 83 y.o. male, with DM type II, ESRD on peritoneal dialysis, HTN, macular degeneration, chronic diastolic CHF, GERD presented to ED with complaints of chest pain.  Hospitalization in July for the same, patient followed by cardiology at the [facility], who presents to ED today complaining of chest pain, intermittent, over the last 48 hours, reports overall 3 episodes, chest pressure/pain, radiating to left arm, he does report some dyspnea as well, resolved with nitro, lasted for 10 minutes, overall reports it happens at rest, most recent was yesterday. -In the ED his chest x-ray with no acute finding, BNP elevated at 2302, his EKG showing left bundle branch block, 96> 93, currently patient is chest pain-free, he was seen by cardiology who recommended admission to [facility] for right/left cardiac cath.  Review of systems:     A full 10 point Review of Systems was done, except as stated above, all other Review of Systems were negative.   With Past History of the following :    Past Medical History:  Diagnosis Date   Abnormal result of other cardiovascular function study 04/21/2021   Abnormality of plasma protein, unspecified 04/21/2021   Acute kidney failure, unspecified 04/21/2021   Acute on chronic diastolic (congestive) heart failure (HCC) 04/21/2021   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   Adverse effect of antihyperlipidemic and antiarteriosclerotic drugs, initial encounter 12/19/2017   Benign prostatic hyperplasia with urinary obstruction 04/21/2021   Formatting of this note might be different from  the original. Last Assessment & Plan:  Formatting of this note is different from the original.  - We will continue Flomax .   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   Obesity 04/11/2017   Osteoarthritis 11/11/2008   Other chest pain 04/21/2021   Proteinuria 01/21/2021   Rhinitis, chronic 03/09/2021   Shortness of breath 04/21/2021   Sleep apnea    cpap   Trochanteric bursitis of right hip 04/27/2017   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      Past Surgical History:  Procedure Laterality Date  ACHILLES TENDON REPAIR Left 2014   AV FISTULA PLACEMENT Left 08/16/2022   Procedure: LEFT ARM  BRACHIOCEPHALIC ARTERIOVENOUS (AV) FISTULA CREATION;  Surgeon: Magda Debby SAILOR, MD;  Location: [facility] OR;  Service: Vascular;  Laterality: Left;  with regional block   CATARACT EXTRACTION W/ INTRAOCULAR LENS  IMPLANT, BILATERAL  2011   IR FLUORO GUIDE CV LINE RIGHT  05/30/2023   IR REMOVAL TUN CV CATH W/O FL  06/06/2023   IR US  GUIDE VASC ACCESS RIGHT  05/30/2023      Social History:     Social History   Tobacco Use   Smoking status: Former    Current packs/day: 0.00    Types: Cigarettes    Quit date: 02/01/1990    Years since quitting: 33.9   Smokeless tobacco: Never  Substance Use Topics   Alcohol  use: No        Family History :     Family History  Problem Relation Age of Onset   Diabetes Mother    Prostate cancer Maternal Grandfather    Colon cancer Neg  Hx       Home Medications:   Prior to Admission medications   Medication Sig Start Date End Date Taking? Authorizing Provider  aspirin  EC 81 MG tablet Take 81 mg by mouth every morning.   Yes [provider]  B Complex-C-Zn-Folic Acid  (DIALYVITE 800-ZINC 15) 0.8 MG TABS Take 1 tablet by mouth daily. 11/15/23  Yes [provider]  carvedilol  (COREG ) 25 MG tablet Take 0.5 tablets (12.5 mg total) by mouth 2 (two) times daily. 10/19/23 01/17/24 Yes Dunn, Dayna N, PA-C  ezetimibe  (ZETIA ) 10 MG tablet Take 10 mg by mouth daily.   Yes [provider]  fluticasone (FLONASE) 50 MCG/ACT nasal spray Place 2 sprays into the nose daily. 11/11/23 11/10/24 Yes [provider]  furosemide  (LASIX ) 40 MG tablet Take 2 tablets by mouth 2 (two) times daily. 11/09/23  Yes [provider]  gabapentin  (NEURONTIN ) 100 MG capsule Take 100-200 mg by mouth 2 (two) times daily. Take 100 mg in the morning and 200 mg at bedtime 02/08/19  Yes [provider]  gemfibrozil  (LOPID ) 600 MG tablet Take 1 tablet by mouth 2 (two) times daily before a meal. 11/09/23  Yes [provider]  hydrALAZINE  (APRESOLINE ) 100 MG tablet Take 100 mg by mouth 3 (three) times daily. 08/19/22  Yes [provider]  insulin  aspart (NOVOLOG ) 100 UNIT/ML injection Inject 6-18 Units into the skin 3 (three) times daily with meals. Per sliding scale, 1 UNIT FOR EVERY 10 GRAMS CARBOHYDRATES, CORRECTION   Yes [provider]  Multiple Vitamins-Minerals (PRESERVISION AREDS 2) CAPS Take 1 capsule by mouth 2 (two) times daily.   Yes [provider]  NIFEdipine  (ADALAT  CC) 60 MG 24 hr tablet Take 120 mg by mouth daily.   Yes [provider]  nitroGLYCERIN  (NITROSTAT ) 0.4 MG SL tablet Place 1 tablet (0.4 mg total) under the tongue every 5 (five) minutes as needed for chest pain. 08/08/23  Yes Rai, Ripudeep K, MD  omeprazole (PRILOSEC) 20 MG capsule Take 20 mg by mouth  daily.   Yes [provider]  rosuvastatin (CRESTOR) 20 MG tablet Take 20 mg by mouth once a week. 08/30/23  Yes [provider]  spironolactone (ALDACTONE) 25 MG tablet Take 25 mg by mouth daily.   Yes [provider]  tamsulosin  (FLOMAX ) 0.4 MG CAPS capsule Take 0.4 mg by mouth at bedtime.   Yes [provider]  acetaminophen  (TYLENOL ) 500 MG tablet Take 1,000 mg by mouth daily as needed for moderate pain.    [provider]  calcitRIOL  (ROCALTROL ) 0.25 MCG capsule Take 0.25 mcg by mouth daily. 11/15/23   [provider]  calcitRIOL  (ROCALTROL ) 0.5 MCG capsule Take 1 mcg by mouth daily. 02/13/21   [provider]  Continuous Glucose Sensor (FREESTYLE LIBRE 2 SENSOR) MISC Inject 1 Device into the skin every 14 (fourteen) days. 06/24/21   [provider]  epoetin alfa (EPOGEN) 4000 UNIT/ML injection Inject 4,000 Units into the vein every 30 (thirty) days.    [provider]  insulin  glargine (LANTUS ) 100 unit/mL SOPN Inject 25 Units into the skin at bedtime. 08/08/23   Rai, Ripudeep MARLA, MD  ondansetron  (ZOFRAN -ODT) 4 MG disintegrating tablet Take 1 tablet (4 mg total) by mouth every 8 (eight) hours as needed for nausea or vomiting. 08/08/23   Davia Nydia MARLA, MD     Allergies:     Allergies  Allergen Reactions   Statins Swelling and Other (See Comments)    Muscle aches   Wound Dressing Adhesive Dermatitis    Pt has rash on L arm from tape used with stitches     Physical Exam:   Vitals  Blood pressure (!) 159/67, pulse 85, temperature 97.8 F (36.6 C), temperature source Oral, resp. rate 14, height 6' (1.829 m), weight 94.4 kg, SpO2 97%.   1. General Developed male, laying in bed, no apparent distress  2. Normal affect and insight, Not Suicidal or Homicidal, Awake Alert, Oriented X 3.  3. No F.N deficits, ALL C.Nerves Intact, Strength 5/5 all 4 extremities, Sensation intact all 4 extremities, Plantars down  going.  4. Ears and Eyes appear Normal, Conjunctivae clear, PERRLA. Moist Oral Mucosa.  5. Supple Neck No cervical lymphadenopathy appriciated, No Carotid Bruits.  6. Symmetrical Chest wall movement, Good air movement bilaterally, CTAB.  7. RRR, No Gallops, Rubs or Murmurs, No Parasternal Heave.  +2 edema  8. Positive Bowel Sounds, Abdomen Soft, No tenderness, No organomegaly appriciated,No rebound -guarding or rigidity.  Peritoneal catheter present and covered  9.  No Cyanosis, Normal Skin Turgor, No Skin Rash or Bruise.  10. Good muscle tone,  joints appear normal , no effusions, Normal ROM.   Data Review:    CBC Recent Labs  Lab 01/05/24 1441  WBC 6.5  HGB 8.8*  HCT 27.1*  PLT 187  MCV 84.4  MCH 27.4  MCHC 32.5  RDW 17.8*  LYMPHSABS 1.2  MONOABS 0.6  EOSABS 0.2  BASOSABS 0.0   ------------------------------------------------------------------------------------------------------------------  Chemistries  Recent Labs  Lab 01/05/24 1441  NA 141  K 3.5  CL 105  CO2 19*  GLUCOSE 170*  BUN 60*  CREATININE 5.44*  CALCIUM 9.3  AST 19  ALT 12  ALKPHOS 69  BILITOT 0.3   ------------------------------------------------------------------------------------------------------------------ estimated creatinine clearance is 12.3 mL/min (A) (by C-G formula based on SCr of 5.44 mg/dL (H)). ------------------------------------------------------------------------------------------------------------------ No results for input(s): TSH, T4TOTAL, T3FREE, THYROIDAB in the last 72 hours.  Invalid input(s): FREET3  Coagulation profile No results for input(s): INR, PROTIME in the last 168 hours. ------------------------------------------------------------------------------------------------------------------- No results for input(s): DDIMER in the last 72  hours. -------------------------------------------------------------------------------------------------------------------  Cardiac Enzymes No results for input(s): CKMB, TROPONINI, MYOGLOBIN in the last 168 hours.  Invalid input(s): CK ------------------------------------------------------------------------------------------------------------------    Component Value Date/Time   BNP 1,115.0 (H) 10/17/2022 1513     ---------------------------------------------------------------------------------------------------------------  Urinalysis    Component Value  Date/Time   COLORURINE STRAW (A) 08/04/2023 1959   APPEARANCEUR CLEAR 08/04/2023 1959   LABSPEC 1.005 08/04/2023 1959   PHURINE 6.0 08/04/2023 1959   GLUCOSEU NEGATIVE 08/04/2023 1959   HGBUR SMALL (A) 08/04/2023 1959   BILIRUBINUR NEGATIVE 08/04/2023 1959   KETONESUR NEGATIVE 08/04/2023 1959   PROTEINUR 100 (A) 08/04/2023 1959   UROBILINOGEN 0.2 05/19/2014 1847   NITRITE NEGATIVE 08/04/2023 1959   LEUKOCYTESUR MODERATE (A) 08/04/2023 1959    ----------------------------------------------------------------------------------------------------------------   Imaging Results:    DG Chest 2 View Result Date: 01/05/2024 EXAM: 2 VIEW(S) XRAY OF THE CHEST 01/05/2024 01:36:51 PM COMPARISON: 08/04/2023 CLINICAL HISTORY: SOB, CP FINDINGS: LUNGS AND PLEURA: EKG lead artifact projects over the right upper lobe on the frontal radiograph. Minimal left lower lobe scarring or subsegmental atelectasis is similar to the prior study. No pleural effusion. No pneumothorax. HEART AND MEDIASTINUM: No acute abnormality of the cardiac and mediastinal silhouettes. BONES AND SOFT TISSUES: No acute osseous abnormality. IMPRESSION: 1. No acute cardiopulmonary process. Electronically signed by: Rockey Kilts MD 01/05/2024 02:10 PM EST RP Workstation: [id number]    ZXH:dpwld rhythm 79 bpm, with left bundle branch block with QTc of 531   Assessment  & Plan:    Principal Problem:   Chest pain Active Problems:   ESRD on peritoneal dialysis [facility])   Essential hypertension   Hyperglycemia due to diabetes mellitus (HCC)   Acute on chronic diastolic (congestive) heart failure (HCC)   OSA on CPAP   Chest pain - Cardiology input greatly appreciated -Myoview stress test in fall 2025 with no ischemia. -Some nontypical features -Volume overload is contributing, BNP is elevated, has lower extremity edema. - Plan for right and left cardiac cath tomorrow with minimal contrast valuate for volume overload and for coronary artery disease. -Currently chest pain-free, continue with home aspirin  and sublingual nitro  ESRD on peritoneal HD -Renal consulted, on dialysis Sunday Monday Wednesday and Friday, so does not get HD tonight, nephrology to evaluate once at [facility], - Continue with home diuretics    Dyslipidemia - continue with home medications    Essential hypertension -Continue with home regimen   Type 2 diabetes mellitus uncontrolled with hypoglycemia (HCC) - Check A1c. - Resume on lower dose insulin  as anticipated to be n.p.o. at midnight) he is on 25 units at bedtime , time but given low CBG I will only give 10 units overnight) -Will add insulin  sliding scale    GERD without esophagitis -PPI   BPH (benign prostatic hyperplasia) -Continue treatment with Flomax    Anemia of chronic kidney disease -Anemia of chronic kidney disease, at baseline most recent hemoglobin 8.3 last month on Care Everywhere, Procrit and iron per renal   DVT Prophylaxis Heparin    AM Labs Ordered, also please review Full Orders  Family Communication: Admission, patients condition and plan of care including tests being ordered have been discussed with the patient  who indicate understanding and agree with the plan and Code Status.  Code Status full code  Likely DC to home  Consults called: Cardiology  Admission status: Observation  Time spent  in minutes : 70 minutes   Brayton Lye M.D on 01/05/2024 at 6:09 PM   [facility] - Office  [phone]

## 2024-01-05 NOTE — ED Notes (Signed)
 Unable to obtain IV access (attempted by two nurses). Will attempt US  IV.

## 2024-01-05 NOTE — ED Triage Notes (Signed)
 Pt arrived via POV from home c/o left side chest pain. Pt reports he did complete his dialysis last night and receives dialysis 4 nights a week. Pt endorses feeling SOB.

## 2024-01-05 NOTE — Consult Note (Signed)
 Cardiology Consultation   Patient ID: DINK CREPS MRN: [id number]; DOB: 07/29/1940  Admit date: 01/05/2024 Date of Consult: 01/05/2024  PCP:  [name], MD   [facility] Providers Cardiologist:  Diannah SHAUNNA Maywood, MD    Patient Profile: Carlos Gill is a 83 y.o. male with a hx of HTN, ESRD who is being seen 01/05/2024 for the evaluation of chest pain  at the request of ER MD .  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     Pt admitted in July 2025   with CP on exertion  Trop 15,29, 26.   EKG with SR, LBBB Echo done showing LVEF normal   Cardiology saw pt   Plan for outpt follow up with possible stress test  The pt  is also followed at the [facility])  He was seen at [facility] and stress test was recommended.  This was done. This showed small inferior defect (fixed)   Felt to be diaphragm  No ischemia  LVEF 475  No further testing done  A b blocker was also recomm for palpitations   The pt was seen by B Strader on 12/08/23 He was not having CP at the time   The pt presents today to ER for evaluaton of CP   The pt says over the past couple days he has had 3 episodes of chest pressure/pain  Radiates to L arm   Associated with SOB    Last about 10 min   He has taken NTG (SL ) to relieve   NO pain with activity  Does get SOB with activity like walking to mail box   This is chronic   Overall he is not active   Watches TV  Last episode of CP was yesterday The pt went to dialysis center today  Told them about CP   THey told him to go to ER  He has not had any CP today      Here BNP 2302   Cr 5.4  Trop 96, 93     He is on peritoneal dialysis 4x per week  Still urinates also     Weight has been stable   Appetite OK    Has AV graft in ( L arm )  Never used    Past Medical History:  Diagnosis Date   Abnormal result of other cardiovascular function study 04/21/2021    Abnormality of plasma protein, unspecified 04/21/2021   Acute kidney failure, unspecified 04/21/2021   Acute on chronic diastolic (congestive) heart failure (HCC) 04/21/2021   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   Adverse effect of antihyperlipidemic and antiarteriosclerotic drugs, initial encounter 12/19/2017   Benign prostatic hyperplasia with urinary obstruction 04/21/2021   Formatting of this note might be different from the original. Last Assessment & Plan:  Formatting of this note is different from the original.  - We will continue Flomax .   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   Diabetes mellitus without complication (HCC)    Enlarged prostate 04/21/2021   Epididymitis 11/09/2021   Essential hypertension 11/02/2021   Fatigue 12/23/2015   GERD without esophagitis 11/02/2021   Hyperglycemia due to diabetes mellitus (HCC) 11/09/2021   Hyperlipidemia    Hypertension    Macular degeneration 04/21/2021   Nonexudative  age-related macular degeneration 04/19/2018   Obesity 04/11/2017   Osteoarthritis 11/11/2008   Other chest pain 04/21/2021   Proteinuria 01/21/2021   Rhinitis, chronic 03/09/2021   Shortness of breath 04/21/2021   Sleep apnea    cpap   Trochanteric bursitis of right hip 04/27/2017   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    Past Surgical History:  Procedure Laterality Date   ACHILLES TENDON REPAIR Left 2014   AV FISTULA PLACEMENT Left 08/16/2022   Procedure: LEFT ARM  BRACHIOCEPHALIC ARTERIOVENOUS (AV) FISTULA CREATION;  Surgeon: Magda Debby SAILOR, MD;  Location: [facility] OR;  Service: Vascular;  Laterality: Left;  with regional block   CATARACT EXTRACTION W/ INTRAOCULAR LENS  IMPLANT, BILATERAL  2011   IR FLUORO GUIDE CV LINE RIGHT  05/30/2023   IR REMOVAL TUN CV CATH W/O FL  06/06/2023   IR US  GUIDE VASC ACCESS RIGHT  05/30/2023      Home Medications:  Prior to Admission medications   Medication Sig Start Date End Date Taking? Authorizing Provider  acetaminophen  (TYLENOL ) 500 MG tablet Take 1,000 mg by mouth daily as needed for moderate pain.    [provider]  aspirin  EC 81 MG tablet Take 81 mg by mouth every morning.    [provider]  B Complex-C-Zn-Folic Acid  (DIALYVITE 800-ZINC 15) 0.8 MG TABS Take 1 tablet by mouth daily. 11/15/23   [provider]  calcitRIOL  (ROCALTROL ) 0.25 MCG capsule Take 0.25 mcg by mouth daily. 11/15/23   [provider]  calcitRIOL  (ROCALTROL ) 0.5 MCG capsule Take 1 mcg by mouth daily. 02/13/21   [provider]  carvedilol  (COREG ) 25 MG tablet Take 0.5 tablets (12.5 mg total) by mouth 2 (two) times daily. 10/19/23 01/17/24  Dunn, Dayna N, PA-C  Continuous Glucose Sensor (FREESTYLE LIBRE 2 SENSOR) MISC Inject 1 Device into the skin every 14 (fourteen) days. 06/24/21   [provider]  epoetin alfa (EPOGEN) 4000 UNIT/ML injection Inject 4,000 Units into the vein every 30 (thirty) days.    [provider]  ezetimibe  (ZETIA ) 10 MG tablet Take 10 mg by mouth daily.    [provider]  fluticasone (FLONASE) 50 MCG/ACT nasal spray Place 2 sprays into the nose daily. 11/11/23 11/10/24  [provider]  furosemide  (LASIX ) 40 MG tablet Take 2 tablets by mouth 2 (two) times daily. 11/09/23   [provider]  gabapentin  (NEURONTIN ) 100 MG capsule Take 100-200 mg by mouth 2 (two) times daily. Take 100 mg in the morning and 200 mg at bedtime 02/08/19   [provider]  gemfibrozil  (LOPID ) 600 MG tablet Take 1 tablet by mouth 2 (two) times daily before a meal. 11/09/23   [provider]  hydrALAZINE  (APRESOLINE ) 100 MG tablet Take 100 mg by mouth 3 (three) times daily. 08/19/22   [provider]  insulin  aspart (NOVOLOG ) 100 UNIT/ML injection Inject 6-18 Units into the skin 3 (three) times daily  with meals. Per sliding scale, 1 UNIT FOR EVERY 10 GRAMS CARBOHYDRATES, CORRECTION    [provider]  insulin  glargine (LANTUS ) 100 unit/mL SOPN Inject 25 Units into the skin at bedtime. 08/08/23   Rai, Nydia POUR, MD  Multiple Vitamins-Minerals (PRESERVISION AREDS 2) CAPS Take 1 capsule by mouth 2 (two) times daily.    [provider]  NIFEdipine  (ADALAT  CC) 60 MG 24 hr tablet Take 120 mg by mouth daily.    [provider]  nitroGLYCERIN  (NITROSTAT ) 0.4 MG SL tablet Place  1 tablet (0.4 mg total) under the tongue every 5 (five) minutes as needed for chest pain. 08/08/23   Rai, Nydia POUR, MD  omeprazole (PRILOSEC) 20 MG capsule Take 20 mg by mouth daily.    [provider]  ondansetron  (ZOFRAN -ODT) 4 MG disintegrating tablet Take 1 tablet (4 mg total) by mouth every 8 (eight) hours as needed for nausea or vomiting. 08/08/23   Rai, Ripudeep K, MD  rosuvastatin (CRESTOR) 20 MG tablet Take 20 mg by mouth once a week. 08/30/23   [provider]  spironolactone (ALDACTONE) 25 MG tablet Take 25 mg by mouth daily.    [provider]  tamsulosin  (FLOMAX ) 0.4 MG CAPS capsule Take 0.4 mg by mouth at bedtime.    [provider]    Scheduled Meds:  aspirin   324 mg Oral Once   Continuous Infusions:    Allergies:    Allergies  Allergen Reactions   Statins Swelling and Other (See Comments)    Muscle aches   Wound Dressing Adhesive Dermatitis    Pt has rash on L arm from tape used with stitches    Social History:   Social History   Socioeconomic History   Marital status: Married    Spouse name: Not on file   Number of children: Not on file   Years of education: Not on file   Highest education level: Not on file  Occupational History   Not on file  Tobacco Use   Smoking status: Former    Current packs/day: 0.00    Types: Cigarettes    Quit date: 02/01/1990    Years since quitting: 33.9   Smokeless tobacco: Never  Vaping Use    Vaping status: Never Used  Substance and Sexual Activity   Alcohol  use: No   Drug use: No   Sexual activity: Not on file  Other Topics Concern   Not on file  Social History Narrative   Not on file   Social Drivers of Health   Financial Resource Strain: Low Risk  (03/07/2023)   Received from [facility]   Overall Financial Resource Strain (CARDIA)    Difficulty of Paying Living Expenses: Not hard at all  Food Insecurity: No Food Insecurity (08/05/2023)   Hunger Vital Sign    Worried About Running Out of Food in the Last Year: Never true    Ran Out of Food in the Last Year: Never true  Transportation Needs: No Transportation Needs (08/05/2023)   PRAPARE - Administrator, Civil Service (Medical): No    Lack of Transportation (Non-Medical): No  Physical Activity: Not on file  Stress: No Stress Concern Present (11/10/2021)   Received from [facility] - Occupational Stress Questionnaire    Feeling of Stress : Only a little  Social Connections: Socially Integrated (08/05/2023)   Social Connection and Isolation Panel    Frequency of Communication with Friends and Family: Twice a week    Frequency of Social Gatherings with Friends and Family: Twice a week    Attends Religious Services: More than 4 times per year    Active Member of [employer] or Organizations: No    Attends Engineer, Structural: 1 to 4 times per year    Marital Status: Married  Catering Manager Violence: Not At Risk (08/05/2023)   Humiliation, Afraid, Rape, and Kick questionnaire    Fear of Current or Ex-Partner: No    Emotionally Abused: No  Physically Abused: No    Sexually Abused: No    Family History:    Family History  Problem Relation Age of Onset   Diabetes Mother    Prostate cancer Maternal Grandfather    Colon cancer Neg Hx      ROS:  Please see the history of present illness.   All other ROS reviewed and negative.     Physical  Exam/Data: Vitals:   01/05/24 1430 01/05/24 1445 01/05/24 1500 01/05/24 1515  BP: (!) 136/59 (!) 148/61 (!) 154/68 (!) 153/105  Pulse: 79 80 81 79  Resp: 10 13 18 17   Temp:      SpO2: 98% 99% 96% 98%  Weight:      Height:       No intake or output data in the 24 hours ending 01/05/24 1554    01/05/2024    1:18 PM 12/08/2023   12:57 PM 10/13/2023    3:37 PM  Last 3 Weights  Weight (lbs) 208 lb 1.8 oz 208 lb 3.2 oz 222 lb 6.4 oz  Weight (kg) 94.4 kg 94.439 kg 100.88 kg     Body mass index is 28.23 kg/m.  General:  Well nourished, well developed, in no acute distress HEENT: normal Neck: no JVD   Vascular: No carotid bruits; Distal pulses 2+ bilaterally Cardiac:  normal S1, S2; RRR; no significant murmur  Lungs:  clear to auscultation bilaterally, no wheezing, rhonchi or rales  Abd: soft, nontender, no hepatomegaly  Ext: 1+ edema Musculoskeletal:  No deformities, BUE and BLE strength normal and equal Skin: warm and dry  Neuro:  CNs 2-12 intact, no focal abnormalities noted Psych:  Normal affect   EKG:  The EKG was personally reviewed and demonstrates:  NSR with occasional PAC  79 bpm  LBBB (old) Telemetry:  Telemetry was personally reviewed and demonstrates:  SR  Relevant CV Studies: Echocardiogram: 08/2023 IMPRESSIONS     1. Left ventricular ejection fraction, by estimation, is 55 to 60%. The  left ventricle has normal function. The left ventricle has no regional  wall motion abnormalities. There is moderate concentric left ventricular  hypertrophy. Left ventricular  diastolic parameters are indeterminate.   2. Right ventricular systolic function is normal. The right ventricular  size is normal. There is normal pulmonary artery systolic pressure. The  estimated right ventricular systolic pressure is 24.9 mmHg.   3. A small pericardial effusion is present. The pericardial effusion is  circumferential. There is no evidence of cardiac tamponade.   4. The mitral valve is  grossly normal. Mild mitral valve regurgitation.   5. The aortic valve is tricuspid. Aortic valve regurgitation is trivial.  No aortic stenosis is present. Aortic valve mean gradient measures 7.0  mmHg.   6. The inferior vena cava is normal in size with greater than 50%  respiratory variability, suggesting right atrial pressure of 3 mmHg.   Comparison(s): Prior images reviewed side by side. LVEF 55-60%, septal  motion constent with LBBB. Estimated RVSP now normal. Mild mitral and  tricuspid regurgitation. Small pericardial effusion.    Event Monitor: 09/2023   Patch wear time was for 14 days.   Normal sinus rhythm predominantly ranging from 60 to 214 bpm with average HR 83 bpm.   414 runs of nonsustained SVT occurred, fastest interval lasting 6 beats and the longest interval lasting 15.2 seconds.   No evidence of atrial fibrillation/flutter, ventricular arrhythmias, high-grade AV block or pauses.   1.3% PAC burden and <1% PVC burden.  Patient triggered events correlate with NSR (72 to 91 bpm), PAC and PVC.    Laboratory Data: High Sensitivity Troponin:  No results for input(s): TROPONINIHS in the last 720 hours.   Chemistry Recent Labs  Lab 01/05/24 1441  NA 141  K 3.5  CL 105  CO2 19*  GLUCOSE 170*  BUN 60*  CREATININE 5.44*  CALCIUM  9.3  GFRNONAA 10*  ANIONGAP 18*    Recent Labs  Lab 01/05/24 1441  PROT 6.9  ALBUMIN 3.9  AST 19  ALT 12  ALKPHOS 69  BILITOT 0.3   Lipids No results for input(s): CHOL, TRIG, HDL, LABVLDL, LDLCALC, CHOLHDL in the last 168 hours.  Hematology Recent Labs  Lab 01/05/24 1441  WBC 6.5  RBC 3.21*  HGB 8.8*  HCT 27.1*  MCV 84.4  MCH 27.4  MCHC 32.5  RDW 17.8*  PLT 187   Thyroid No results for input(s): TSH, FREET4 in the last 168 hours.  BNP Recent Labs  Lab 01/05/24 1441  PROBNP 2,302.0*    DDimer No results for input(s): DDIMER in the last 168 hours.  Radiology/Studies:  DG Chest 2 View Result  Date: 01/05/2024 EXAM: 2 VIEW(S) XRAY OF THE CHEST 01/05/2024 01:36:51 PM COMPARISON: 08/04/2023 CLINICAL HISTORY: SOB, CP FINDINGS: LUNGS AND PLEURA: EKG lead artifact projects over the right upper lobe on the frontal radiograph. Minimal left lower lobe scarring or subsegmental atelectasis is similar to the prior study. No pleural effusion. No pneumothorax. HEART AND MEDIASTINUM: No acute abnormality of the cardiac and mediastinal silhouettes. BONES AND SOFT TISSUES: No acute osseous abnormality. IMPRESSION: 1. No acute cardiopulmonary process. Electronically signed by: Rockey Kilts MD 01/05/2024 02:10 PM EST RP Workstation: [id number]     Assessment and Plan: Chest pain   Pt has recurrent intermitt chest pains   Admitted in July 2025   Myoview in Fall 2025 showed no ischemia    Few episodes of pain over the past couple days.   Pain atypical but he is not active at all   Stays at home, watching TV Trop with very mild flat elevation  (96, 94)  BNP elevated      EKG with LBBB  Question if some of symptoms may be related to inadequate volume management.  On exam, appears oK but BNP is up       Given above would recomm a R and L heart cath   (minimal contrast   No LV gram). Pt understands risks and agrees to proceed.  2   ESRD  Pt on peritoneal dialysis with some urin output  Minimal contrast at cath  3  DM  Hx of DM x 30 years  Follows with R Shepard and at [place] I would give less insulin  tonight as pt will be NPO and follow in am    4  HL   Lipids in July LDL 120, HDL 40    Given DM alone it should be lower     Keep on Crestor , Zetia  and Gemfibrozil  for now      Will need to discuss Repatha  5  HTN  BP is mildly increased   Follow overnight      Informed Consent      :[id number]} The risks [stroke (1 in 1000), death (1 in 1000), kidney failure [usually temporary] (1 in 500), bleeding (1 in 200), allergic reaction [possibly serious] (1 in 200)], benefits (diagnostic support and management of  coronary artery disease) and alternatives of a cardiac catheterization were discussed  in detail with Mr. Clevenger and he is willing to proceed.         For questions or updates, please contact [facility] Please consult [email or web address] for contact info under   Signed, Vina Gull, MD  01/05/2024 3:54 PM

## 2024-01-05 NOTE — ED Notes (Signed)
[facility] called to transport patient. Nurse aware.

## 2024-01-05 NOTE — ED Provider Notes (Signed)
 [place] EMERGENCY DEPARTMENT AT [facility] Provider Note   CSN: [id number] Arrival date & time: 01/05/24  1309     Patient presents with: Chest Pain   Carlos Gill is a 83 y.o. male.  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  Presents to ER today for evaluation of left-sided chest pain.  Started 2-3 days ago. It comes and goes, and does radiate down the left arm, it has no exacerbating or alleviating symptoms. It does seem to resolve with NTG, though he has not had any of this today. He also has SOB on exertion. No LE edema, no pleurisy.  He does not have pain at this time.  He follows with [facility] in [place], last appointment was on 12/08/2023 and at that time he was having some intermittent chest pain that was unchanged.  He had echocardiogram in July 2025 that showed LVEF of 55 to 60% with normal function and no regional wall wall motion abnormalities and moderate concentric LVH, RV systolic function normal and he has small pericardial effusion.     Chest Pain      Prior to Admission medications   Medication Sig Start Date End Date Taking? Authorizing Provider  acetaminophen  (TYLENOL ) 500 MG tablet Take 1,000 mg by mouth daily as needed for moderate pain.    [provider]  aspirin  EC 81 MG tablet Take 81 mg by mouth every morning.    [provider]  B Complex-C-Zn-Folic Acid  (DIALYVITE 800-ZINC 15) 0.8 MG TABS Take 1 tablet by mouth daily. 11/15/23   [provider]  calcitRIOL  (ROCALTROL ) 0.25 MCG capsule Take 0.25 mcg by mouth daily. 11/15/23   [provider]  calcitRIOL  (ROCALTROL ) 0.5 MCG capsule Take 1 mcg by mouth daily. 02/13/21   [provider]  carvedilol  (COREG ) 25 MG tablet Take 0.5 tablets (12.5 mg total) by mouth 2 (two) times daily. 10/19/23 01/17/24  Dunn, Dayna N, PA-C  Continuous Glucose Sensor (FREESTYLE LIBRE 2 SENSOR)  MISC Inject 1 Device into the skin every 14 (fourteen) days. 06/24/21   [provider]  epoetin alfa (EPOGEN) 4000 UNIT/ML injection Inject 4,000 Units into the vein every 30 (thirty) days.    [provider]  ezetimibe  (ZETIA ) 10 MG tablet Take 10 mg by mouth daily.    [provider]  fluticasone (FLONASE) 50 MCG/ACT nasal spray Place 2 sprays into the nose daily. 11/11/23 11/10/24  [provider]  furosemide  (LASIX ) 40 MG tablet Take 2 tablets by mouth 2 (two) times daily. 11/09/23   [provider]  gabapentin  (NEURONTIN ) 100 MG capsule Take 100-200 mg by mouth 2 (two) times daily. Take 100 mg in the morning and 200 mg at bedtime 02/08/19   [provider]  gemfibrozil  (LOPID ) 600 MG tablet Take 1 tablet by mouth 2 (two) times daily before a meal. 11/09/23   [provider]  hydrALAZINE  (APRESOLINE ) 100 MG tablet Take 100 mg by mouth 3 (three) times daily. 08/19/22   [provider]  insulin  aspart (NOVOLOG ) 100 UNIT/ML injection Inject 6-18 Units into the skin 3 (three) times daily with meals. Per sliding scale, 1 UNIT FOR EVERY 10 GRAMS CARBOHYDRATES, CORRECTION    [provider]  insulin  glargine (LANTUS ) 100 unit/mL SOPN Inject 25 Units into the skin at bedtime. 08/08/23   Rai, Nydia POUR, MD  Multiple Vitamins-Minerals (PRESERVISION AREDS 2) CAPS Take 1  capsule by mouth 2 (two) times daily.    [provider]  NIFEdipine  (ADALAT  CC) 60 MG 24 hr tablet Take 120 mg by mouth daily.    [provider]  nitroGLYCERIN  (NITROSTAT ) 0.4 MG SL tablet Place 1 tablet (0.4 mg total) under the tongue every 5 (five) minutes as needed for chest pain. 08/08/23   Rai, Nydia POUR, MD  omeprazole (PRILOSEC) 20 MG capsule Take 20 mg by mouth daily.    [provider]  ondansetron  (ZOFRAN -ODT) 4 MG disintegrating tablet Take 1 tablet (4 mg total) by mouth every 8 (eight) hours as needed for nausea or vomiting.  08/08/23   Rai, Nydia POUR, MD  rosuvastatin  (CRESTOR ) 20 MG tablet Take 20 mg by mouth once a week. 08/30/23   [provider]  spironolactone  (ALDACTONE ) 25 MG tablet Take 25 mg by mouth daily.    [provider]  tamsulosin  (FLOMAX ) 0.4 MG CAPS capsule Take 0.4 mg by mouth at bedtime.    [provider]    Allergies: Statins and Wound dressing adhesive    Review of Systems  Cardiovascular:  Positive for chest pain.    Updated Vital Signs BP (!) 141/63 (BP Location: Right Arm)   Pulse 77   Temp 97.6 F (36.4 C)   Resp 15   Ht 6' (1.829 m)   Wt 94.4 kg   SpO2 100%   BMI 28.23 kg/m   Physical Exam Vitals and nursing note reviewed.  Constitutional:      General: He is not in acute distress.    Appearance: He is well-developed.  HENT:     Head: Normocephalic and atraumatic.  Eyes:     Conjunctiva/sclera: Conjunctivae normal.     Pupils: Pupils are equal, round, and reactive to light.  Cardiovascular:     Rate and Rhythm: Normal rate and regular rhythm.     Heart sounds: No murmur heard. Pulmonary:     Effort: Pulmonary effort is normal. No respiratory distress.     Breath sounds: Normal breath sounds.  Abdominal:     Palpations: Abdomen is soft.     Tenderness: There is no abdominal tenderness.  Musculoskeletal:        General: No swelling. Normal range of motion.     Cervical back: Neck supple.     Right lower leg: No edema.     Left lower leg: No edema.  Skin:    General: Skin is warm and dry.     Capillary Refill: Capillary refill takes Carlos than 2 seconds.  Neurological:     Mental Status: He is alert.  Psychiatric:        Mood and Affect: Mood normal.     (all labs ordered are listed, but only abnormal results are displayed) Labs Reviewed  CBC WITH DIFFERENTIAL/PLATELET  COMPREHENSIVE METABOLIC PANEL WITH GFR  PRO BRAIN NATRIURETIC PEPTIDE  TROPONIN T, HIGH SENSITIVITY    EKG: None  Radiology: DG Chest 2 View Result  Date: 01/05/2024 EXAM: 2 VIEW(S) XRAY OF THE CHEST 01/05/2024 01:36:51 PM COMPARISON: 08/04/2023 CLINICAL HISTORY: SOB, CP FINDINGS: LUNGS AND PLEURA: EKG lead artifact projects over the right upper lobe on the frontal radiograph. Minimal left lower lobe scarring or subsegmental atelectasis is similar to the prior study. No pleural effusion. No pneumothorax. HEART AND MEDIASTINUM: No acute abnormality of the cardiac and mediastinal silhouettes. BONES AND SOFT TISSUES: No acute osseous abnormality. IMPRESSION: 1. No acute cardiopulmonary process. Electronically signed by: Rockey Kilts MD 01/05/2024  02:10 PM EST RP Workstation: [id number]     Procedures   Medications Ordered in the ED - No data to display                                  Medical Decision Making This patient presents to the ED for concern of intermittent chest pain worsening for the past 2 to 3 days, this involves an extensive number of treatment options, and is a complaint that carries with it a high risk of complications and morbidity.  The differential diagnosis includes ACS, PE, pneumonia, unstable angina, other   Co morbidities that complicate the patient evaluation  Peritoneal dialysis patient, high cholesterol, hypertension   Additional history obtained:  Additional history obtained from EMR External records from outside source obtained and reviewed including recent cardiology note, prior labs   Lab Tests:  I Ordered, and personally interpreted labs.  The pertinent results include: Troponin elevated x 2 but flat at 96 and 93, BNP elevated at 2302, BUN and creatinine elevated consistent with CKD but potassium is normal and patient does not have significantly decreased CO2, CBC with no leukocytosis, mild anemia likely due to ESRD   Imaging Studies ordered:  I ordered imaging studies including chest x-ray I independently visualized and interpreted imaging which showed pulmonary edema or infiltrate I agree with the  radiologist interpretation  Cardiac monitoring: Patient in sinus rhythm-left bundle branch block pattern   Problem List / ED Course / Critical interventions / Medication management  Intermittent left chest pain that is increasing in frequency radiating to the left arm-concerning for possible unstable angina.  He has been pain-free in the ED.  ECG shows left bundle branch block but does not meet Sgarbossa criteria for STEMI equivalent.  Troponins are flat, given his increasing symptoms I consulted cardiology for concern of possible unstable angina.  Dr. Okey consulted and saw the patient in the emergency department.  She feels patient needs a cardiac catheterization as he has had repeated visits for chest pain and has not had a catheterization.  She has contacted the Cath Lab at [facility] and they are going to put him on the first case for tomorrow.  Hospitalist service to admit.  I discussed with Dr. Sherlon for admission.  Patient is going to be admitted to [facility] since he will need catheterization tomorrow, and also will need peritoneal dialysis which is not available here.  I have reviewed the patients home medicines and have made adjustments as needed       Amount and/or Complexity of Data Reviewed Labs: ordered. Radiology: ordered.  Risk OTC drugs. Decision regarding hospitalization.        Final diagnoses:  None    ED Discharge Orders     None          Suellen Sherran DELENA DEVONNA 01/05/24 2352    Suzette Pac, MD 01/06/24 1158

## 2024-01-06 ENCOUNTER — Encounter (HOSPITAL_COMMUNITY): Admission: EM | Disposition: A | Payer: Self-pay | Source: Ambulatory Visit | Attending: Emergency Medicine

## 2024-01-06 DIAGNOSIS — R079 Chest pain, unspecified: Secondary | ICD-10-CM | POA: Diagnosis not present

## 2024-01-06 DIAGNOSIS — I249 Acute ischemic heart disease, unspecified: Secondary | ICD-10-CM

## 2024-01-06 HISTORY — PX: RIGHT/LEFT HEART CATH AND CORONARY ANGIOGRAPHY: CATH118266

## 2024-01-06 LAB — POCT I-STAT 7, (LYTES, BLD GAS, ICA,H+H)
Acid-base deficit: 2 mmol/L (ref 0.0–2.0)
Acid-base deficit: 3 mmol/L — ABNORMAL HIGH (ref 0.0–2.0)
Acid-base deficit: 3 mmol/L — ABNORMAL HIGH (ref 0.0–2.0)
Bicarbonate: 21.5 mmol/L (ref 20.0–28.0)
Bicarbonate: 22.5 mmol/L (ref 20.0–28.0)
Bicarbonate: 23.2 mmol/L (ref 20.0–28.0)
Calcium, Ion: 1.15 mmol/L (ref 1.15–1.40)
Calcium, Ion: 1.19 mmol/L (ref 1.15–1.40)
Calcium, Ion: 1.2 mmol/L (ref 1.15–1.40)
HCT: 25 % — ABNORMAL LOW (ref 39.0–52.0)
HCT: 26 % — ABNORMAL LOW (ref 39.0–52.0)
HCT: 27 % — ABNORMAL LOW (ref 39.0–52.0)
Hemoglobin: 8.5 g/dL — ABNORMAL LOW (ref 13.0–17.0)
Hemoglobin: 8.8 g/dL — ABNORMAL LOW (ref 13.0–17.0)
Hemoglobin: 9.2 g/dL — ABNORMAL LOW (ref 13.0–17.0)
O2 Saturation: 70 %
O2 Saturation: 72 %
O2 Saturation: 93 %
Potassium: 3.4 mmol/L — ABNORMAL LOW (ref 3.5–5.1)
Potassium: 3.4 mmol/L — ABNORMAL LOW (ref 3.5–5.1)
Potassium: 3.5 mmol/L (ref 3.5–5.1)
Sodium: 143 mmol/L (ref 135–145)
Sodium: 143 mmol/L (ref 135–145)
Sodium: 143 mmol/L (ref 135–145)
TCO2: 23 mmol/L (ref 22–32)
TCO2: 24 mmol/L (ref 22–32)
TCO2: 24 mmol/L (ref 22–32)
pCO2 arterial: 35.5 mmHg (ref 32–48)
pCO2 arterial: 40.6 mmHg (ref 32–48)
pCO2 arterial: 41.9 mmHg (ref 32–48)
pH, Arterial: 7.351 (ref 7.35–7.45)
pH, Arterial: 7.352 (ref 7.35–7.45)
pH, Arterial: 7.39 (ref 7.35–7.45)
pO2, Arterial: 38 mmHg — CL (ref 83–108)
pO2, Arterial: 39 mmHg — CL (ref 83–108)
pO2, Arterial: 68 mmHg — ABNORMAL LOW (ref 83–108)

## 2024-01-06 LAB — RENAL FUNCTION PANEL
Albumin: 2.7 g/dL — ABNORMAL LOW (ref 3.5–5.0)
Anion gap: 9 (ref 5–15)
BUN: 64 mg/dL — ABNORMAL HIGH (ref 8–23)
CO2: 24 mmol/L (ref 22–32)
Calcium: 8.6 mg/dL — ABNORMAL LOW (ref 8.9–10.3)
Chloride: 106 mmol/L (ref 98–111)
Creatinine, Ser: 5.95 mg/dL — ABNORMAL HIGH (ref 0.61–1.24)
GFR, Estimated: 9 mL/min — ABNORMAL LOW (ref >60)
Glucose, Bld: 134 mg/dL — ABNORMAL HIGH (ref 70–99)
Phosphorus: 5.8 mg/dL — ABNORMAL HIGH (ref 2.5–4.6)
Potassium: 3.3 mmol/L — ABNORMAL LOW (ref 3.5–5.1)
Sodium: 139 mmol/L (ref 135–145)

## 2024-01-06 LAB — CBC
HCT: 26.1 % — ABNORMAL LOW (ref 39.0–52.0)
Hemoglobin: 8.5 g/dL — ABNORMAL LOW (ref 13.0–17.0)
MCH: 27 pg (ref 26.0–34.0)
MCHC: 32.6 g/dL (ref 30.0–36.0)
MCV: 82.9 fL (ref 80.0–100.0)
Platelets: 183 K/uL (ref 150–400)
RBC: 3.15 MIL/uL — ABNORMAL LOW (ref 4.22–5.81)
RDW: 17.8 % — ABNORMAL HIGH (ref 11.5–15.5)
WBC: 5.4 K/uL (ref 4.0–10.5)
nRBC: 0.4 % — ABNORMAL HIGH (ref 0.0–0.2)

## 2024-01-06 LAB — LIPID PANEL
Cholesterol: 169 mg/dL (ref 0–200)
HDL: 39 mg/dL — ABNORMAL LOW (ref >40)
LDL Cholesterol: 110 mg/dL — ABNORMAL HIGH (ref 0–99)
Total CHOL/HDL Ratio: 4.3 ratio
Triglycerides: 101 mg/dL (ref <150)
VLDL: 20 mg/dL (ref 0–40)

## 2024-01-06 LAB — GLUCOSE, CAPILLARY
Glucose-Capillary: 116 mg/dL — ABNORMAL HIGH (ref 70–99)
Glucose-Capillary: 78 mg/dL (ref 70–99)
Glucose-Capillary: 171 mg/dL — ABNORMAL HIGH (ref 70–99)
Glucose-Capillary: 247 mg/dL — ABNORMAL HIGH (ref 70–99)

## 2024-01-06 SURGERY — RIGHT/LEFT HEART CATH AND CORONARY ANGIOGRAPHY
Anesthesia: LOCAL

## 2024-01-06 MED ORDER — SODIUM CHLORIDE 0.9% FLUSH: 3.0000 mL | INTRAVENOUS | Status: DC | PRN

## 2024-01-06 MED ORDER — MIDAZOLAM HCL 2 MG/2ML IJ SOLN
INTRAMUSCULAR | Status: AC
  Filled 2024-01-06: qty 2

## 2024-01-06 MED ORDER — GENTAMICIN SULFATE 0.1 % EX CREA
1.0000 | TOPICAL_CREAM | Freq: Every day | CUTANEOUS | Status: DC
  Administered 2024-01-06: 1 via TOPICAL
  Filled 2024-01-06: qty 15

## 2024-01-06 MED ORDER — SODIUM CHLORIDE 0.9 % IV SOLN: 250.0000 mL | INTRAVENOUS | Status: DC | PRN

## 2024-01-06 MED ORDER — SODIUM CHLORIDE 0.9% FLUSH
3.0000 mL | Freq: Two times a day (BID) | INTRAVENOUS | Status: DC
  Administered 2024-01-06 – 2024-01-07 (×3): 3 mL via INTRAVENOUS

## 2024-01-06 MED ORDER — HEPARIN SODIUM (PORCINE) 1000 UNIT/ML IJ SOLN
INTRAMUSCULAR | Status: AC
  Filled 2024-01-06: qty 10

## 2024-01-06 MED ORDER — DELFLEX-LC/2.5% DEXTROSE 394 MOSM/L IP SOLN: INTRAPERITONEAL | Status: DC

## 2024-01-06 MED ORDER — HEPARIN (PORCINE) IN NACL 1000-0.9 UT/500ML-% IV SOLN
INTRAVENOUS | Status: DC | PRN
  Administered 2024-01-06: 1000 mL via SURGICAL_CAVITY

## 2024-01-06 MED ORDER — HEPARIN SODIUM (PORCINE) 1000 UNIT/ML IJ SOLN
INTRAMUSCULAR | Status: DC | PRN
  Administered 2024-01-06: 5000 [IU] via INTRAVENOUS

## 2024-01-06 MED ORDER — VERAPAMIL HCL 2.5 MG/ML IV SOLN
INTRAVENOUS | Status: DC | PRN
  Administered 2024-01-06: 10 mL via INTRA_ARTERIAL

## 2024-01-06 MED ORDER — LIDOCAINE HCL (PF) 1 % IJ SOLN
INTRAMUSCULAR | Status: AC
  Filled 2024-01-06: qty 30

## 2024-01-06 MED ORDER — IOHEXOL 350 MG/ML SOLN
INTRAVENOUS | Status: DC | PRN
  Administered 2024-01-06: 35 mL via INTRA_ARTERIAL

## 2024-01-06 MED ORDER — VERAPAMIL HCL 2.5 MG/ML IV SOLN
INTRAVENOUS | Status: AC
  Filled 2024-01-06: qty 2

## 2024-01-06 MED ORDER — HYDRALAZINE HCL 20 MG/ML IJ SOLN: 10.0000 mg | INTRAMUSCULAR | Status: AC | PRN

## 2024-01-06 MED ORDER — MIDAZOLAM HCL (PF) 2 MG/2ML IJ SOLN
INTRAMUSCULAR | Status: DC | PRN
  Administered 2024-01-06: 1 mg via INTRAVENOUS

## 2024-01-06 MED ORDER — LIDOCAINE HCL (PF) 1 % IJ SOLN
INTRAMUSCULAR | Status: DC | PRN
  Administered 2024-01-06 (×2): 2 mL

## 2024-01-06 MED ORDER — CARVEDILOL 25 MG PO TABS
25.0000 mg | ORAL_TABLET | Freq: Two times a day (BID) | ORAL | Status: DC
  Administered 2024-01-06 – 2024-01-07 (×2): 25 mg via ORAL
  Filled 2024-01-06 (×2): qty 1

## 2024-01-06 MED ORDER — FENTANYL CITRATE (PF) 100 MCG/2ML IJ SOLN
INTRAMUSCULAR | Status: DC | PRN
  Administered 2024-01-06: 25 ug via INTRAVENOUS

## 2024-01-06 MED ORDER — HEPARIN SODIUM (PORCINE) 1000 UNIT/ML IJ SOLN
INTRAPERITONEAL | Status: DC | PRN
  Filled 2024-01-06: qty 6000

## 2024-01-06 MED ORDER — FENTANYL CITRATE (PF) 100 MCG/2ML IJ SOLN
INTRAMUSCULAR | Status: AC
  Filled 2024-01-06: qty 2

## 2024-01-06 SURGICAL SUPPLY — 10 items
CATH 5FR JL3.5 JR4 ANG PIG MP (CATHETERS) IMPLANT
CATH BALLN WEDGE 5F 110CM (CATHETERS) IMPLANT
DEVICE RAD COMP TR BAND LRG (VASCULAR PRODUCTS) IMPLANT
ELECT DEFIB PAD ADLT CADENCE (PAD) IMPLANT
GLIDESHEATH SLEND SS 6F .021 (SHEATH) IMPLANT
GUIDEWIRE INQWIRE 1.5J.035X260 (WIRE) IMPLANT
PACK CARDIAC CATHETERIZATION (CUSTOM PROCEDURE TRAY) ×1 IMPLANT
SET ATX-X65L (MISCELLANEOUS) IMPLANT
SHEATH GLIDE SLENDER 4/5FR (SHEATH) IMPLANT
SHEATH PROBE COVER 6X72 (BAG) IMPLANT

## 2024-01-06 NOTE — Progress Notes (Signed)
 Heart Failure Navigator Progress Note  Assessed for Heart & Vascular TOC clinic readiness.  Patient does not meet criteria due to ESRD on hemodialysis. No HF TOC.   Navigator will sign off at this time.   Randie Bustle, BSN, Scientist, clinical (histocompatibility and immunogenetics) Only

## 2024-01-06 NOTE — Plan of Care (Signed)
  Problem: Education: Goal: Knowledge of General Education information will improve Description: Including pain rating scale, medication(s)/side effects and non-pharmacologic comfort measures Outcome: Progressing   Problem: Health Behavior/Discharge Planning: Goal: Ability to manage health-related needs will improve Outcome: Progressing   Problem: Clinical Measurements: Goal: Ability to maintain clinical measurements within normal limits will improve Outcome: Progressing   Problem: Clinical Measurements: Goal: Will remain free from infection Outcome: Progressing   Problem: Clinical Measurements: Goal: Cardiovascular complication will be avoided Outcome: Progressing   Problem: Skin Integrity: Goal: Risk for impaired skin integrity will decrease Outcome: Progressing

## 2024-01-06 NOTE — TOC CM/SW Note (Addendum)
 Transition of Care [facility]) - Inpatient Brief Assessment   Patient Details  Name: MATEO OVERBECK MRN: [id number] Date of Birth: May 26, 1940  [facility]) CM/SW Contact:    Waddell Barnie Rama, RN Phone Number: 01/06/2024, 2:31 PM   Clinical Narrative: From home with spouse, son , grandson and DIL, has PCP and insurance on file, states has no HH services in place at this time, but does have an aide thru [facility] on MWF from 9am to 12, has no DME at  at home.  States family member will transport them home at [employer] and family is support system, states gets medications from [facility] on [address] in [place].  Pta self ambulatory.   There are no ICM needs identified  at this time.  Please place consult for ICM needs. NCM notified April with [facility] that patient is here.    Transition of Care Asessment: Insurance and Status: Insurance coverage has been reviewed Patient has primary care physician: Yes Home environment has been reviewed: home with wife Prior level of function:: indep Prior/Current Home Services: No current home services Social Drivers of Health Review: SDOH reviewed no interventions necessary Readmission risk has been reviewed: Yes Transition of care needs: no transition of care needs at this time

## 2024-01-06 NOTE — Progress Notes (Signed)
   01/06/24 1644  Completion  Treatment Status Started  Procedure Comments  Tolerated treatment well? Yes  Peritoneal Dialysis Comments Pt. voiced no concerns or complaints. Report given to bedside RN if patient need to come off  the PD machine. HD dialysis RN gave Noella gloves, gown, Minicap.  Education / Care Plan  Dialysis Education Provided Yes  Hand-off documentation  Hand-off Given Given to shift RN/LPN  Report given to (Full Name) Verdel Shams RN  Hand-off Received Received from shift RN/LPN  Report received from (Full Name) Zebedee Mace RN   PD tx initation note:   Pre TX VS: 97.9, 158/67, 18, 76  Pre TX weight: 95.3 kg  PD treatment initiated via aseptic technique. Consent signed and in chart. Patient is alert and oriented. No complaints of pain. No specimen collected. PD exit site clean, dry and intact. Gentamycin and new dressing applied. Bedside RN educated on PD machine and how to contact tech support when PD machine alarms.

## 2024-01-06 NOTE — Progress Notes (Addendum)
 PROGRESS NOTE    Carlos Gill  FMW:[id number] DOB: 10/13/1940 DOA: 01/05/2024 PCP: [name], MD  Chronically ill 83/M with type 2 diabetes mellitus, ESRD on peritoneal dialysis, hypertension, diastolic CHF, GERD presented to the ED with chest pain, hospitalized in July for same, intermittent over 2 days, radiating to left arm with some dyspnea as well, resolved with nitro, in the ED chest x-ray was unremarkable, BNP elevated at 03/06/2000, EKG noted LBBB, troponin 96, 93.  Presented to [facility], transferred to [facility] for cardiac workup  Subjective: Feels better, some dyspnea, no further chest pain  Assessment and Plan:  Chest pain LBBB - Cardiology input greatly appreciated -Myoview stress test in fall 2025 with no ischemia. -Some atypical features -Volume overloaded as well which could be contributing to some symptoms - Currently remains chest pain-free, continue aspirin , sublingual nitro - Cards following plan for right and left heart cath today   ESRD on peritoneal HD Volume overload -Renal consulted, on dialysis Sunday Monday Wednesday and Friday, - Continue with home diuretics -May need more volume removed with PD    Essential hypertension -Continue carvedilol , hydralazine , nifedipine    Type 2 diabetes mellitus uncontrolled with hypoglycemia (HCC) - A1c is 8.7, continue current lower dose of insulin  while n.p.o.   Anemia of chronic disease GERD  - Stable, monitor   BPH (benign prostatic hyperplasia) -Continue treatment with Flomax    Anemia of chronic kidney disease - Stable   DVT prophylaxis: Heparin  subcutaneous Code Status: Full code Family Communication: None present Disposition Plan: Home pending above workup  Consultants:    Procedures:   Antimicrobials:    Objective: Vitals:   01/05/24 2011 01/05/24 2347 01/06/24 0016 01/06/24 0801  BP: (!) 149/64  (!) 140/68 (!) 155/69  Pulse: 71  75 80  Resp: 19  17 19   Temp: (!) 97.5 F (36.4  C)  97.8 F (36.6 C) 97.8 F (36.6 C)  TempSrc: Oral  Oral Oral  SpO2: 100%  98% 100%  Weight:  92.8 kg    Height:        Intake/Output Summary (Last 24 hours) at 01/06/2024 1005 Last data filed at 01/06/2024 0600 Gross per 24 hour  Intake 200.25 ml  Output 720 ml  Net -519.75 ml   Filed Weights   01/05/24 1318 01/05/24 2347  Weight: 94.4 kg 92.8 kg    Examination:  General exam: Appears calm and comfortable, AO x 3, no distress Respiratory system: Few basilar rales Cardiovascular system: S1 & S2 heard, RRR.  Abd: nondistended, soft and nontender.Normal bowel sounds heard. Central nervous system: Alert and oriented. No focal neurological deficits. Extremities: Trace edema Skin: No rashes Psychiatry:  Mood & affect appropriate.     Data Reviewed:   CBC: Recent Labs  Lab 01/05/24 1441 01/06/24 0232  WBC 6.5 5.4  NEUTROABS 4.5  --   HGB 8.8* 8.5*  HCT 27.1* 26.1*  MCV 84.4 82.9  PLT 187 183   Basic Metabolic Panel: Recent Labs  Lab 01/05/24 1441 01/06/24 0232  NA 141 139  K 3.5 3.3*  CL 105 106  CO2 19* 24  GLUCOSE 170* 134*  BUN 60* 64*  CREATININE 5.44* 5.95*  CALCIUM  9.3 8.6*  PHOS  --  5.8*   GFR: Estimated Creatinine Clearance: 10.3 mL/min (A) (by C-G formula based on SCr of 5.95 mg/dL (H)). Liver Function Tests: Recent Labs  Lab 01/05/24 1441 01/06/24 0232  AST 19  --   ALT 12  --  ALKPHOS 69  --   BILITOT 0.3  --   PROT 6.9  --   ALBUMIN 3.9 2.7*   No results for input(s): LIPASE, AMYLASE in the last 168 hours. No results for input(s): AMMONIA in the last 168 hours. Coagulation Profile: No results for input(s): INR, PROTIME in the last 168 hours. Cardiac Enzymes: No results for input(s): CKTOTAL, CKMB, CKMBINDEX, TROPONINI in the last 168 hours. BNP (last 3 results) Recent Labs    01/05/24 1441  PROBNP 2,302.0*   HbA1C: Recent Labs    01/05/24 2106  HGBA1C 8.7*   CBG: Recent Labs  Lab 01/05/24 2128  01/06/24 0603  GLUCAP 227* 116*   Lipid Profile: Recent Labs    01/06/24 0232  CHOL 169  HDL 39*  LDLCALC 110*  TRIG 101  CHOLHDL 4.3   Thyroid Function Tests: No results for input(s): TSH, T4TOTAL, FREET4, T3FREE, THYROIDAB in the last 72 hours. Anemia Panel: No results for input(s): VITAMINB12, FOLATE, FERRITIN, TIBC, IRON, RETICCTPCT in the last 72 hours. Urine analysis:    Component Value Date/Time   COLORURINE STRAW (A) 08/04/2023 1959   APPEARANCEUR CLEAR 08/04/2023 1959   LABSPEC 1.005 08/04/2023 1959   PHURINE 6.0 08/04/2023 1959   GLUCOSEU NEGATIVE 08/04/2023 1959   HGBUR SMALL (A) 08/04/2023 1959   BILIRUBINUR NEGATIVE 08/04/2023 1959   KETONESUR NEGATIVE 08/04/2023 1959   PROTEINUR 100 (A) 08/04/2023 1959   UROBILINOGEN 0.2 05/19/2014 1847   NITRITE NEGATIVE 08/04/2023 1959   LEUKOCYTESUR MODERATE (A) 08/04/2023 1959   Sepsis Labs: @LABRCNTIP (procalcitonin:4,lacticidven:4)  )No results found for this or any previous visit (from the past 240 hours).   Radiology Studies: DG Chest 2 View Result Date: 01/05/2024 EXAM: 2 VIEW(S) XRAY OF THE CHEST 01/05/2024 01:36:51 PM COMPARISON: 08/04/2023 CLINICAL HISTORY: SOB, CP FINDINGS: LUNGS AND PLEURA: EKG lead artifact projects over the right upper lobe on the frontal radiograph. Minimal left lower lobe scarring or subsegmental atelectasis is similar to the prior study. No pleural effusion. No pneumothorax. HEART AND MEDIASTINUM: No acute abnormality of the cardiac and mediastinal silhouettes. BONES AND SOFT TISSUES: No acute osseous abnormality. IMPRESSION: 1. No acute cardiopulmonary process. Electronically signed by: Rockey Kilts MD 01/05/2024 02:10 PM EST RP Workstation: [id number]     Scheduled Meds:  aspirin  EC  81 mg Oral q morning   carvedilol   12.5 mg Oral BID WC   ezetimibe   10 mg Oral Daily   furosemide   80 mg Oral BID   gabapentin   100 mg Oral q morning   And   gabapentin   200 mg  Oral QHS   gemfibrozil   600 mg Oral BID AC   heparin   5,000 Units Subcutaneous Q8H   hydrALAZINE   100 mg Oral TID   insulin  aspart  0-5 Units Subcutaneous QHS   insulin  aspart  0-6 Units Subcutaneous TID WC   insulin  glargine-yfgn  10 Units Subcutaneous Daily   NIFEdipine   120 mg Oral Daily   pantoprazole   40 mg Oral Daily   [START ON 01/11/2024] rosuvastatin   20 mg Oral Weekly   spironolactone   25 mg Oral Daily   tamsulosin   0.4 mg Oral QHS   Continuous Infusions:  sodium chloride  10 mL/hr at 01/06/24 0558     LOS: 0 days    Time spent:    Sigurd Pac, MD [facility]   01/06/2024, 10:05 AM

## 2024-01-06 NOTE — Consult Note (Signed)
 [name] Renal Consultation Note    Indication for Consultation:  Management of ESRD/dialysis; anemia, hypertension/volume and secondary hyperparathyroidism   HPI: Carlos Gill is a 83 y.o. male with ESRD on PD, DMT2, HTN, dCHF, macular degeneration who is admitted with chest pain. Presented to [facility] with chest pain radiating to left arm and dyspnea.  CXR negative, EKG w LBBB. Labs notable K 3.3, BUN 64, Hgb 8.5  Trop 96>93 BNP 2302. Cardiology consulted and transferred to [facility] cardiac cath today.   Seen and examined in room. Feels better this am. No chest pain or shortness of breath currently.   Started peritoneal dialysis in January. Course has been uneventful. No complications. Last dialyzed on Wednesday evening. Denies f/c, abd pain, n/v, cloudy effluent.   Past Medical History:  Diagnosis Date   Abnormal result of other cardiovascular function study 04/21/2021   Abnormality of plasma protein, unspecified 04/21/2021   Acute kidney failure, unspecified 04/21/2021   Acute on chronic diastolic (congestive) heart failure (HCC) 04/21/2021   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   Adverse effect of antihyperlipidemic and antiarteriosclerotic drugs, initial encounter 12/19/2017   Benign prostatic hyperplasia with urinary obstruction 04/21/2021   Formatting of this note might be different from the original. Last Assessment & Plan:  Formatting of this note is different from the original.  - We will continue Flomax .   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   Diabetes mellitus without complication (HCC)    Enlarged prostate 04/21/2021   Epididymitis 11/09/2021   Essential hypertension 11/02/2021   Fatigue 12/23/2015   GERD without esophagitis 11/02/2021   Hyperglycemia due to diabetes mellitus (HCC) 11/09/2021   Hyperlipidemia    Hypertension    Macular  degeneration 04/21/2021   Nonexudative age-related macular degeneration 04/19/2018   Obesity 04/11/2017   Osteoarthritis 11/11/2008   Other chest pain 04/21/2021   Proteinuria 01/21/2021   Rhinitis, chronic 03/09/2021   Shortness of breath 04/21/2021   Sleep apnea    cpap   Trochanteric bursitis of right hip 04/27/2017   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   Past Surgical History:  Procedure Laterality Date   ACHILLES TENDON REPAIR Left 2014   AV FISTULA PLACEMENT Left 08/16/2022   Procedure: LEFT ARM  BRACHIOCEPHALIC ARTERIOVENOUS (AV) FISTULA CREATION;  Surgeon: Magda Debby SAILOR, MD;  Location: [facility] OR;  Service: Vascular;  Laterality: Left;  with regional block   CATARACT EXTRACTION W/ INTRAOCULAR LENS  IMPLANT, BILATERAL  2011   IR FLUORO GUIDE CV LINE RIGHT  05/30/2023   IR REMOVAL TUN CV CATH W/O FL  06/06/2023   IR US  GUIDE VASC ACCESS RIGHT  05/30/2023   Family History  Problem Relation Age of Onset   Diabetes Mother    Prostate cancer Maternal Grandfather    Colon cancer Neg Hx    Social History:  reports that he quit smoking about 33 years ago. His smoking use included cigarettes. He has never used smokeless tobacco. He reports that he does not drink alcohol  and does not use drugs. Allergies  Allergen Reactions   Statins Swelling and Other (See Comments)    Muscle aches   Wound Dressing Adhesive Dermatitis    Pt has rash on L arm from tape used with stitches   Prior to Admission medications   Medication Sig Start Date End Date Taking? Authorizing Provider  aspirin  EC 81 MG  tablet Take 81 mg by mouth every morning.   Yes [provider]  B Complex-C-Zn-Folic Acid  (DIALYVITE 800-ZINC 15) 0.8 MG TABS Take 1 tablet by mouth daily. 11/15/23  Yes [provider]  carvedilol  (COREG ) 25 MG tablet Take 0.5 tablets (12.5 mg total) by mouth 2 (two) times daily. 10/19/23 01/17/24 Yes Dunn, Dayna N, PA-C  ezetimibe  (ZETIA ) 10 MG  tablet Take 10 mg by mouth daily.   Yes [provider]  fluticasone (FLONASE) 50 MCG/ACT nasal spray Place 2 sprays into the nose daily. 11/11/23 11/10/24 Yes [provider]  furosemide  (LASIX ) 40 MG tablet Take 2 tablets by mouth 2 (two) times daily. 11/09/23  Yes [provider]  gabapentin  (NEURONTIN ) 100 MG capsule Take 100-200 mg by mouth 2 (two) times daily. Take 100 mg in the morning and 200 mg at bedtime 02/08/19  Yes [provider]  gemfibrozil  (LOPID ) 600 MG tablet Take 1 tablet by mouth 2 (two) times daily before a meal. 11/09/23  Yes [provider]  hydrALAZINE  (APRESOLINE ) 100 MG tablet Take 100 mg by mouth 3 (three) times daily. 08/19/22  Yes [provider]  insulin  aspart (NOVOLOG ) 100 UNIT/ML injection Inject 6-18 Units into the skin 3 (three) times daily with meals. Per sliding scale, 1 UNIT FOR EVERY 10 GRAMS CARBOHYDRATES, CORRECTION   Yes [provider]  insulin  glargine (LANTUS ) 100 unit/mL SOPN Inject 25 Units into the skin at bedtime. Patient taking differently: Inject 30 Units into the skin every morning. 08/08/23  Yes Rai, Ripudeep K, MD  Multiple Vitamins-Minerals (PRESERVISION AREDS 2) CAPS Take 1 capsule by mouth 2 (two) times daily.   Yes [provider]  NIFEdipine  (ADALAT  CC) 60 MG 24 hr tablet Take 120 mg by mouth daily.   Yes [provider]  nitroGLYCERIN  (NITROSTAT ) 0.4 MG SL tablet Place 1 tablet (0.4 mg total) under the tongue every 5 (five) minutes as needed for chest pain. 08/08/23  Yes Rai, Ripudeep K, MD  omeprazole (PRILOSEC) 20 MG capsule Take 20 mg by mouth daily.   Yes [provider]  rosuvastatin  (CRESTOR ) 20 MG tablet Take 20 mg by mouth once a week. 08/30/23  Yes [provider]  spironolactone  (ALDACTONE ) 25 MG tablet Take 25 mg by mouth daily.   Yes [provider]  tamsulosin  (FLOMAX ) 0.4 MG CAPS capsule Take 0.4 mg by mouth at bedtime.   Yes  [provider]  acetaminophen  (TYLENOL ) 500 MG tablet Take 1,000 mg by mouth daily as needed for moderate pain.    [provider]  calcitRIOL  (ROCALTROL ) 0.25 MCG capsule Take 0.25 mcg by mouth daily. 11/15/23   [provider]  calcitRIOL  (ROCALTROL ) 0.5 MCG capsule Take 1 mcg by mouth daily. 02/13/21   [provider]  Continuous Glucose Sensor (FREESTYLE LIBRE 2 SENSOR) MISC Inject 1 Device into the skin every 14 (fourteen) days. 06/24/21   [provider]  epoetin alfa (EPOGEN) 4000 UNIT/ML injection Inject 4,000 Units into the vein every 30 (thirty) days.    [provider]  ondansetron  (ZOFRAN -ODT) 4 MG disintegrating tablet Take 1 tablet (4 mg total) by mouth every 8 (eight) hours as needed for nausea or vomiting. 08/08/23   Rai, Nydia POUR, MD  sevelamer carbonate (RENVELA) 800 MG tablet Take 1 tablet by mouth 3 (three) times daily with meals. Patient not taking: Reported on 01/05/2024 12/27/23 12/26/24  [provider]   Current Facility-Administered Medications  Medication Dose Route Frequency Provider Last  Rate Last Admin   0.9 %  sodium chloride  infusion   Intravenous Continuous Okey Vina GAILS, MD 10 mL/hr at 01/06/24 0558 New Bag at 01/06/24 0558   acetaminophen  (TYLENOL ) tablet 650 mg  650 mg Oral Q4H PRN Elgergawy, Dawood S, MD       aspirin  EC tablet 81 mg  81 mg Oral q morning Elgergawy, Dawood S, MD       carvedilol  (COREG ) tablet 12.5 mg  12.5 mg Oral BID WC Elgergawy, Dawood S, MD   12.5 mg at 01/05/24 2202   ezetimibe  (ZETIA ) tablet 10 mg  10 mg Oral Daily Elgergawy, Dawood S, MD       furosemide  (LASIX ) tablet 80 mg  80 mg Oral BID Elgergawy, Dawood S, MD       gabapentin  (NEURONTIN ) capsule 100 mg  100 mg Oral q morning Elgergawy, Brayton RAMAN, MD       And   gabapentin  (NEURONTIN ) capsule 200 mg  200 mg Oral QHS Elgergawy, Dawood S, MD   200 mg at 01/05/24 2201   gemfibrozil  (LOPID ) tablet 600 mg  600 mg Oral BID AC  Elgergawy, Dawood S, MD       heparin  injection 5,000 Units  5,000 Units Subcutaneous Q8H Elgergawy, Dawood S, MD   5,000 Units at 01/06/24 9447   hydrALAZINE  (APRESOLINE ) tablet 100 mg  100 mg Oral TID Elgergawy, Dawood S, MD   100 mg at 01/05/24 2202   insulin  aspart (novoLOG ) injection 0-5 Units  0-5 Units Subcutaneous QHS Elgergawy, Dawood S, MD   2 Units at 01/05/24 2202   insulin  aspart (novoLOG ) injection 0-6 Units  0-6 Units Subcutaneous TID WC Elgergawy, Brayton RAMAN, MD       insulin  glargine-yfgn (SEMGLEE ) injection 10 Units  10 Units Subcutaneous Daily Elgergawy, Brayton RAMAN, MD       NIFEdipine  (PROCARDIA  XL/NIFEDICAL XL) 24 hr tablet 120 mg  120 mg Oral Daily Elgergawy, Dawood S, MD       nitroGLYCERIN  (NITROSTAT ) SL tablet 0.4 mg  0.4 mg Sublingual Q5 min PRN Elgergawy, Brayton RAMAN, MD       ondansetron  (ZOFRAN ) injection 4 mg  4 mg Intravenous Q6H PRN Elgergawy, Dawood S, MD       pantoprazole  (PROTONIX ) EC tablet 40 mg  40 mg Oral Daily Elgergawy, Dawood S, MD       [START ON 01/11/2024] rosuvastatin  (CRESTOR ) tablet 20 mg  20 mg Oral Weekly Elgergawy, Brayton RAMAN, MD       spironolactone  (ALDACTONE ) tablet 25 mg  25 mg Oral Daily Elgergawy, Dawood S, MD       tamsulosin  (FLOMAX ) capsule 0.4 mg  0.4 mg Oral QHS Elgergawy, Dawood S, MD   0.4 mg at 01/05/24 2202     ROS: As per HPI otherwise negative.  Physical Exam: Vitals:   01/05/24 2011 01/05/24 2347 01/06/24 0016 01/06/24 0801  BP: (!) 149/64  (!) 140/68 (!) 155/69  Pulse: 71  75 80  Resp: 19  17 19   Temp: (!) 97.5 F (36.4 C)  97.8 F (36.6 C) 97.8 F (36.6 C)  TempSrc: Oral  Oral Oral  SpO2: 100%  98% 100%  Weight:  92.8 kg    Height:         General: Appears comfortable, in no distress  Head: NCAT sclera not icteric MMM CV: Regular rate, no murmur, no rub  Pulm: normal respiratory effort, lungs clear  Abdomen: non-tender, no masses  Lower extremities: no edema or ischemic  changes, no open wounds  Neuro: A & O X 3.  Moves all extremities spontaneously. Psych:  Responds to questions appropriately with a normal affect. Dialysis Access: PD cath in place; LUE AVF +bruit   Labs: Basic Metabolic Panel: Recent Labs  Lab 01/05/24 1441 01/06/24 0232  NA 141 139  K 3.5 3.3*  CL 105 106  CO2 19* 24  GLUCOSE 170* 134*  BUN 60* 64*  CREATININE 5.44* 5.95*  CALCIUM  9.3 8.6*  PHOS  --  5.8*   Liver Function Tests: Recent Labs  Lab 01/05/24 1441 01/06/24 0232  AST 19  --   ALT 12  --   ALKPHOS 69  --   BILITOT 0.3  --   PROT 6.9  --   ALBUMIN 3.9 2.7*   No results for input(s): LIPASE, AMYLASE in the last 168 hours. No results for input(s): AMMONIA in the last 168 hours. CBC: Recent Labs  Lab 01/05/24 1441 01/06/24 0232  WBC 6.5 5.4  NEUTROABS 4.5  --   HGB 8.8* 8.5*  HCT 27.1* 26.1*  MCV 84.4 82.9  PLT 187 183   Cardiac Enzymes: No results for input(s): CKTOTAL, CKMB, CKMBINDEX, TROPONINI in the last 168 hours. CBG: Recent Labs  Lab 01/05/24 2128 01/06/24 0603  GLUCAP 227* 116*   Iron Studies: No results for input(s): IRON, TIBC, TRANSFERRIN, FERRITIN in the last 72 hours. Studies/Results: DG Chest 2 View Result Date: 01/05/2024 EXAM: 2 VIEW(S) XRAY OF THE CHEST 01/05/2024 01:36:51 PM COMPARISON: 08/04/2023 CLINICAL HISTORY: SOB, CP FINDINGS: LUNGS AND PLEURA: EKG lead artifact projects over the right upper lobe on the frontal radiograph. Minimal left lower lobe scarring or subsegmental atelectasis is similar to the prior study. No pleural effusion. No pneumothorax. HEART AND MEDIASTINUM: No acute abnormality of the cardiac and mediastinal silhouettes. BONES AND SOFT TISSUES: No acute osseous abnormality. IMPRESSION: 1. No acute cardiopulmonary process. Electronically signed by: Rockey Kilts MD 01/05/2024 02:10 PM EST RP Workstation: [id number]    Dialysis Orders:  CCPD 5x/wk  4 exchanges, 3L dwell, 1.5H Outpatient nephrologist: Dr. Rachele    Assessment/Plan: Chest pain. Cardiology consulted. For cardiac catheterization today  ESRD.  CCPD. Resume PD tonight. Using all 2.5% dextrose .  Hypertension/Volume  BP elevated. Continue home meds. On Lasix  and spironolactone   UF as able.  Anemia. Hgb 8.5. Follow trends. Resume ESA as indicated if stays in hospital  Metabolic bone disease.  Ca/Phos acceptable  DMT2. Hgb A1c 8.7 Per primary   Maisie Ronnald Acosta PA-C [facility] 01/06/2024, 8:43 AM

## 2024-01-06 NOTE — Interval H&P Note (Signed)
 History and Physical Interval Note:  01/06/2024 12:07 PM  Carlos Gill  has presented today for surgery, with the diagnosis of CP/SOB.  The various methods of treatment have been discussed with the patient and family. After consideration of risks, benefits and other options for treatment, the patient has consented to  Procedure(s): RIGHT/LEFT HEART CATH AND CORONARY ANGIOGRAPHY (N/A) as a surgical intervention.  The patient's history has been reviewed, patient examined, no change in status, stable for surgery.  I have reviewed the patient's chart and labs.  Questions were answered to the patient's satisfaction.   Cath Lab Visit (complete for each Cath Lab visit)  Clinical Evaluation Leading to the Procedure:   ACS: Yes.    Non-ACS:    Anginal Classification: CCS III  Anti-ischemic medical therapy: Maximal Therapy (2 or more classes of medications)  Non-Invasive Test Results: Low-risk stress test findings: cardiac mortality <1%/year  Prior CABG: No previous CABG        Carlos Gill [facility] 01/06/2024 12:07 PM

## 2024-01-06 NOTE — H&P (View-Only) (Signed)
  Progress Note  Patient Name: Carlos Gill Date of Encounter: 01/06/2024 [facility] Cardiologist: Vishnu P Mallipeddi, MD   Interval Summary   No acute events overnight No recurrent chest pain.  Breathing well  Vital Signs Vitals:   01/05/24 2011 01/05/24 2347 01/06/24 0016 01/06/24 0801  BP: (!) 149/64  (!) 140/68 (!) 155/69  Pulse: 71  75 80  Resp: 19  17 19   Temp: (!) 97.5 F (36.4 C)  97.8 F (36.6 C) 97.8 F (36.6 C)  TempSrc: Oral  Oral Oral  SpO2: 100%  98% 100%  Weight:  92.8 kg    Height:        Intake/Output Summary (Last 24 hours) at 01/06/2024 1022 Last data filed at 01/06/2024 0600 Gross per 24 hour  Intake 200.25 ml  Output 720 ml  Net -519.75 ml      01/05/2024   11:47 PM 01/05/2024    1:18 PM 12/08/2023   12:57 PM  Last 3 Weights  Weight (lbs) 204 lb 8 oz 208 lb 1.8 oz 208 lb 3.2 oz  Weight (kg) 92.761 kg 94.4 kg 94.439 kg      Telemetry/ECG  Sinus rhythm with left bundle branch block, PACs- Personally Reviewed  Physical Exam  GEN: No acute distress.   Neck: No JVD Cardiac: RRR, no murmurs, rubs, or gallops.  Respiratory: Clear to auscultation bilaterally. GI: Soft, nontender, non-distended  MS: No edema  Assessment & Plan  ACS Had an episode of chest pain relieved with nitroglycerin  Had stress test at [facility] October 2025 which demonstrated small inferior ischemia thought to be diaphragmatic attenuation Trops only mildly elevated as would be expected with end-stage renal disease without a delta EKG with chronic left bundle branch block For coronary angiography and right heart catheterization today BNP was elevated to 2302 Continue aspirin  81 mg Increase Coreg  to 25 mg twice daily Continue rosuvastatin  20 mg +2 radial pulses, +2 femoral pulses, no contraindications to DAPT End-stage renal disease On peritoneal dialysis since January Hypertension Increase Coreg  to 25 mg twice daily Continue nifedipine  and 20 mg Hydralazine  100  mg Spironolactone  25 mg T2DM Continue aspirin  81 mg Not a candidate for SGLT2 inhibitor Continue Crestor  20 mg Hyperlipidemia Continue Crestor  20 mg  I have reviewed the risks, indications, and alternatives to cardiac catheterization, possible angioplasty, and stenting with the patient. Risks include but are not limited to bleeding, infection, vascular injury, stroke, myocardial infection, arrhythmia, kidney injury, radiation-related injury in the case of prolonged fluoroscopy use, emergency cardiac surgery, and death. The patient understands the risks of serious complication is 1-2 in 1000 with diagnostic cardiac cath and 1-2% or less with angioplasty/stenting.    For questions or updates, please contact [facility] Please consult [email or web address] for contact info under         Signed, Facundo Allemand K Symphoni Helbling, MD

## 2024-01-06 NOTE — Progress Notes (Addendum)
  Progress Note  Patient Name: Carlos Gill Date of Encounter: 01/06/2024 [facility] Cardiologist: Vishnu P Mallipeddi, MD   Interval Summary   No acute events overnight No recurrent chest pain.  Breathing well  Vital Signs Vitals:   01/05/24 2011 01/05/24 2347 01/06/24 0016 01/06/24 0801  BP: (!) 149/64  (!) 140/68 (!) 155/69  Pulse: 71  75 80  Resp: 19  17 19   Temp: (!) 97.5 F (36.4 C)  97.8 F (36.6 C) 97.8 F (36.6 C)  TempSrc: Oral  Oral Oral  SpO2: 100%  98% 100%  Weight:  92.8 kg    Height:        Intake/Output Summary (Last 24 hours) at 01/06/2024 1022 Last data filed at 01/06/2024 0600 Gross per 24 hour  Intake 200.25 ml  Output 720 ml  Net -519.75 ml      01/05/2024   11:47 PM 01/05/2024    1:18 PM 12/08/2023   12:57 PM  Last 3 Weights  Weight (lbs) 204 lb 8 oz 208 lb 1.8 oz 208 lb 3.2 oz  Weight (kg) 92.761 kg 94.4 kg 94.439 kg      Telemetry/ECG  Sinus rhythm with left bundle branch block, PACs- Personally Reviewed  Physical Exam  GEN: No acute distress.   Neck: No JVD Cardiac: RRR, no murmurs, rubs, or gallops.  Respiratory: Clear to auscultation bilaterally. GI: Soft, nontender, non-distended  MS: No edema  Assessment & Plan  ACS Had an episode of chest pain relieved with nitroglycerin  Had stress test at [facility] October 2025 which demonstrated small inferior ischemia thought to be diaphragmatic attenuation Trops only mildly elevated as would be expected with end-stage renal disease without a delta EKG with chronic left bundle branch block For coronary angiography and right heart catheterization today BNP was elevated to 2302 Continue aspirin  81 mg Increase Coreg  to 25 mg twice daily Continue rosuvastatin  20 mg +2 radial pulses, +2 femoral pulses, no contraindications to DAPT End-stage renal disease On peritoneal dialysis since January Hypertension Increase Coreg  to 25 mg twice daily Continue nifedipine  and 20 mg Hydralazine  100  mg Spironolactone  25 mg T2DM Continue aspirin  81 mg Not a candidate for SGLT2 inhibitor Continue Crestor  20 mg Hyperlipidemia Continue Crestor  20 mg  I have reviewed the risks, indications, and alternatives to cardiac catheterization, possible angioplasty, and stenting with the patient. Risks include but are not limited to bleeding, infection, vascular injury, stroke, myocardial infection, arrhythmia, kidney injury, radiation-related injury in the case of prolonged fluoroscopy use, emergency cardiac surgery, and death. The patient understands the risks of serious complication is 1-2 in 1000 with diagnostic cardiac cath and 1-2% or less with angioplasty/stenting.    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  Patient's Coreg  had already been increased earlier today.  Will continue medical management.  No further cardiac evaluation is necessary.  Continue medical therapy.  [facility]  Will sign off.  Call with questions.   For questions or updates, please contact [facility] Please consult [email or web address] for contact info under         Signed, Horatio Bertz K Mervil Wacker, MD

## 2024-01-07 ENCOUNTER — Other Ambulatory Visit (HOSPITAL_COMMUNITY): Payer: Self-pay

## 2024-01-07 DIAGNOSIS — R079 Chest pain, unspecified: Secondary | ICD-10-CM | POA: Diagnosis not present

## 2024-01-07 LAB — CBC
HCT: 27.7 % — ABNORMAL LOW (ref 39.0–52.0)
Hemoglobin: 9 g/dL — ABNORMAL LOW (ref 13.0–17.0)
MCH: 26.9 pg (ref 26.0–34.0)
MCHC: 32.5 g/dL (ref 30.0–36.0)
MCV: 82.9 fL (ref 80.0–100.0)
Platelets: 177 K/uL (ref 150–400)
RBC: 3.34 MIL/uL — ABNORMAL LOW (ref 4.22–5.81)
RDW: 18 % — ABNORMAL HIGH (ref 11.5–15.5)
WBC: 5 K/uL (ref 4.0–10.5)
nRBC: 0 % (ref 0.0–0.2)

## 2024-01-07 LAB — BASIC METABOLIC PANEL WITH GFR
Anion gap: 12 (ref 5–15)
BUN: 61 mg/dL — ABNORMAL HIGH (ref 8–23)
CO2: 23 mmol/L (ref 22–32)
Calcium: 9.1 mg/dL (ref 8.9–10.3)
Chloride: 104 mmol/L (ref 98–111)
Creatinine, Ser: 5.96 mg/dL — ABNORMAL HIGH (ref 0.61–1.24)
GFR, Estimated: 9 mL/min — ABNORMAL LOW (ref >60)
Glucose, Bld: 219 mg/dL — ABNORMAL HIGH (ref 70–99)
Potassium: 3.2 mmol/L — ABNORMAL LOW (ref 3.5–5.1)
Sodium: 139 mmol/L (ref 135–145)

## 2024-01-07 LAB — GLUCOSE, CAPILLARY: Glucose-Capillary: 134 mg/dL — ABNORMAL HIGH (ref 70–99)

## 2024-01-07 MED ORDER — CARVEDILOL 25 MG PO TABS
25.0000 mg | ORAL_TABLET | Freq: Two times a day (BID) | ORAL | 3 refills | Status: AC
  Filled 2024-01-07: qty 90, 45d supply, fill #0

## 2024-01-07 MED ORDER — POTASSIUM CHLORIDE CRYS ER 20 MEQ PO TBCR
20.0000 meq | EXTENDED_RELEASE_TABLET | Freq: Once | ORAL | Status: AC
  Administered 2024-01-07: 20 meq via ORAL
  Filled 2024-01-07: qty 1

## 2024-01-07 NOTE — Plan of Care (Signed)
  Problem: Education: Goal: Knowledge of General Education information will improve Description: Including pain rating scale, medication(s)/side effects and non-pharmacologic comfort measures Outcome: Progressing   Problem: Clinical Measurements: Goal: Ability to maintain clinical measurements within normal limits will improve Outcome: Progressing   Problem: Clinical Measurements: Goal: Will remain free from infection Outcome: Progressing   Problem: Clinical Measurements: Goal: Respiratory complications will improve Outcome: Progressing   Problem: Activity: Goal: Risk for activity intolerance will decrease Outcome: Progressing   Problem: Pain Managment: Goal: General experience of comfort will improve and/or be controlled Outcome: Progressing   Problem: Safety: Goal: Ability to remain free from injury will improve Outcome: Progressing   Problem: Skin Integrity: Goal: Risk for impaired skin integrity will decrease Outcome: Progressing

## 2024-01-07 NOTE — Progress Notes (Signed)
 [facility] Progress Note   Subjective:   Patient seen and examined at bedside.  Reports feeling better. Tolerated PD well overnight.  No issues.  Denies CP, abdominal pain, palpitations, SOB, muscle weakness, dizziness and fatigue.  Hoping to go home.  States occasionally he has to take K supplements but it has been a while.   Objective Vitals:   01/06/24 1900 01/06/24 2300 01/07/24 0300 01/07/24 0735  BP: (!) 189/80 (!) 172/66 (!) 139/59 (!) 130/59  Pulse: 75 75 73 74  Resp: 18 18 18 17   Temp: 97.8 F (36.6 C) 97.7 F (36.5 C) 99.3 F (37.4 C)   TempSrc: Oral Oral Oral   SpO2: 100% 100% 99%   Weight:   91.7 kg   Height:   6' (1.829 m)    Physical Exam General:alert male in NAD Heart:RRR, no mrg Lungs:CTAB Abdomen:soft, NTND Extremities:no LE edema Dialysis Access: PD cath in RUQ   [facility] Weights   01/05/24 1318 01/05/24 2347 01/07/24 0300  Weight: 94.4 kg 92.8 kg 91.7 kg    Intake/Output Summary (Last 24 hours) at 01/07/2024 1037 Last data filed at 01/07/2024 0648 Gross per 24 hour  Intake --  Output 2411 ml  Net -2411 ml    Additional Objective Labs: Basic Metabolic Panel: Recent Labs  Lab 01/05/24 1441 01/06/24 0232 01/06/24 1240 01/06/24 1244 01/07/24 0252  NA 141 139 143 143  143 139  K 3.5 3.3* 3.4* 3.5  3.4* 3.2*  CL 105 106  --   --  104  CO2 19* 24  --   --  23  GLUCOSE 170* 134*  --   --  219*  BUN 60* 64*  --   --  61*  CREATININE 5.44* 5.95*  --   --  5.96*  CALCIUM  9.3 8.6*  --   --  9.1  PHOS  --  5.8*  --   --   --    Liver Function Tests: Recent Labs  Lab 01/05/24 1441 01/06/24 0232  AST 19  --   ALT 12  --   ALKPHOS 69  --   BILITOT 0.3  --   PROT 6.9  --   ALBUMIN 3.9 2.7*   CBC: Recent Labs  Lab 01/05/24 1441 01/06/24 0232 01/06/24 1240 01/06/24 1244 01/07/24 0252  WBC 6.5 5.4  --   --  5.0  NEUTROABS 4.5  --   --   --   --   HGB 8.8* 8.5* 8.8* 9.2*  8.5* 9.0*  HCT 27.1* 26.1* 26.0* 27.0*  25.0*  27.7*  MCV 84.4 82.9  --   --  82.9  PLT 187 183  --   --  177   CBG: Recent Labs  Lab 01/06/24 0603 01/06/24 1132 01/06/24 1617 01/06/24 2151 01/07/24 0603  GLUCAP 116* 78 171* 247* 134*    Studies/Results: CARDIAC CATHETERIZATION Result Date: 01/06/2024   1st Diag lesion is 80% stenosed.   LV end diastolic pressure is normal. Single vessel CAD with ostial stenosis at the ostium of the first diagonal Normal LV filling pressures. LVEDP 8 mm Hg. PCWP 12/11, mean 10 mm Hg Normal right heart pressures. PAP 34/6, mean 18 mm Hg Cardiac output 11.2 L/min, index 5.23 Plan: recommend medical management   DG Chest 2 View Result Date: 01/05/2024 EXAM: 2 VIEW(S) XRAY OF THE CHEST 01/05/2024 01:36:51 PM COMPARISON: 08/04/2023 CLINICAL HISTORY: SOB, CP FINDINGS: LUNGS AND PLEURA: EKG lead artifact projects over the right upper lobe on  the frontal radiograph. Minimal left lower lobe scarring or subsegmental atelectasis is similar to the prior study. No pleural effusion. No pneumothorax. HEART AND MEDIASTINUM: No acute abnormality of the cardiac and mediastinal silhouettes. BONES AND SOFT TISSUES: No acute osseous abnormality. IMPRESSION: 1. No acute cardiopulmonary process. Electronically signed by: Rockey Kilts MD 01/05/2024 02:10 PM EST RP Workstation: [id number]    Medications:  sodium chloride      dialysis solution 2.5% low-MG/low-CA      aspirin  EC  81 mg Oral q morning   carvedilol   25 mg Oral BID WC   ezetimibe   10 mg Oral Daily   furosemide   80 mg Oral BID   gabapentin   100 mg Oral q morning   And   gabapentin   200 mg Oral QHS   gentamicin  cream  1 Application Topical Daily   heparin   5,000 Units Subcutaneous Q8H   hydrALAZINE   100 mg Oral TID   insulin  aspart  0-5 Units Subcutaneous QHS   insulin  aspart  0-6 Units Subcutaneous TID WC   insulin  glargine-yfgn  10 Units Subcutaneous Daily   NIFEdipine   120 mg Oral Daily   pantoprazole   40 mg Oral Daily   [START ON 01/11/2024]  rosuvastatin   20 mg Oral Weekly   sodium chloride  flush  3 mL Intravenous Q12H   spironolactone   25 mg Oral Daily   tamsulosin   0.4 mg Oral QHS    Dialysis Orders: CCPD 5x/wk  4 exchanges, 3L dwell, 1.5H Outpatient nephrologist: Dr. Rachele    Assessment/Plan: Chest pain - Cardiology consulted. LHC with 1st Diag lesion is 80% stenosed. Plan for medical management. ESRD- CCPD. PD overnight using all 2.5% dextrose .  Hypokalemia - K persistently <3.5, 3.2 this AM.  Given 1 dose 20 mEq KCl. Hypertension/Volume  BP improved with home meds. Continue home meds- Lasix , carvedilol  and spironolactone  Net UF 1.5 L overnight.  Does not appear overloaded.  Anemia. Hgb improved to 9.0. Follow trends. No indications for ESA at this time.  Metabolic bone disease.  Ca/Phos acceptable. Continue home meds. DMT2. Hgb A1c 8.7 Per primary Dipso - to d/c today and follow up with cardiology as outpatient. Ok for d/c from nephrology standpoint.   Manuelita Labella, PA-C [facility] 01/07/2024,10:37 AM  LOS: 0 days

## 2024-01-07 NOTE — TOC Transition Note (Signed)
 Transition of Care [facility]) - Discharge Note   Patient Details  Name: Carlos Gill MRN: [id number] Date of Birth: [birth year]  Transition of Care [facility]) CM/SW Contact:  Tom-Johnson, Mohd Clemons Daphne, RN Phone Number: 01/07/2024, 11:59 AM   Clinical Narrative:     Patient is scheduled for discharge today.  Outpatient f/u, hospital f/u and discharge instructions on AVS. Prescriptions sent to [facility] pharmacy and patient will receive meds prior discharge. No ICM needs or recommendations noted. Grandson, Jamani to transport at discharge.  No further ICM needs noted.     Final next level of care: Home/Self Care Barriers to Discharge: Barriers Resolved   Patient Goals and CMS Choice Patient states their goals for this hospitalization and ongoing recovery are:: To return home CMS Medicare.gov Compare Post Acute Care list provided to:: Patient Choice offered to / list presented to : NA      Discharge Placement                Patient to be transferred to facility by: Grandson Name of family member notified: Jamani    Discharge Plan and Services Additional resources added to the After Visit Summary for                                       Social Drivers of Health (SDOH) Interventions SDOH Screenings   Food Insecurity: No Food Insecurity (01/05/2024)  Housing: Low Risk  (01/05/2024)  Transportation Needs: No Transportation Needs (01/05/2024)  Utilities: Not At Risk (01/05/2024)  Financial Resource Strain: Low Risk  (03/07/2023)   Received from [facility]  Social Connections: Socially Integrated (01/05/2024)  Stress: No Stress Concern Present (11/10/2021)   Received from [facility]  Tobacco Use: Medium Risk (01/05/2024)     Readmission Risk Interventions    08/08/2023   12:34 PM 07/01/2023   11:15 AM 05/30/2023    3:21 PM  Readmission Risk Prevention Plan  Transportation Screening Complete Complete Complete  PCP or Specialist Appt within 3-5 Days  Complete  Complete  HRI or Home Care Consult  Complete Complete  Social Work Consult for Recovery Care Planning/Counseling  Complete Complete  Palliative Care Screening  Not Applicable Not Applicable  Medication Review [employer]) Referral to Pharmacy Referral to Pharmacy Referral to Pharmacy  PCP or Specialist appointment within 3-5 days of discharge Complete    HRI or Home Care Consult Complete    SW Recovery Care/Counseling Consult Complete    Palliative Care Screening Not Applicable    Skilled Nursing Facility Not Applicable

## 2024-01-07 NOTE — Progress Notes (Deleted)
 Physician Discharge Summary  Carlos Gill FMW:[id number] DOB: 1940/07/21 DOA: 01/05/2024  PCP: [name], MD  Admit date: 01/05/2024 Discharge date: 01/07/2024  Time spent: 45 minutes  Recommendations for Outpatient Follow-up:  Cardiology Dr. Mallipedi in 1 month Nephrology in few weeks   Discharge Diagnoses:  Principal Problem:   Chest pain Active Problems:   ESRD on peritoneal dialysis [facility])   Essential hypertension   Hyperglycemia due to diabetes mellitus (HCC)   Acute on chronic diastolic (congestive) heart failure (HCC)   OSA on CPAP   Acute coronary syndrome [facility])   Discharge Condition: Improved  Diet recommendation: Renal diabetic  Filed Weights   01/05/24 1318 01/05/24 2347 01/07/24 0300  Weight: 94.4 kg 92.8 kg 91.7 kg    History of present illness:  Chronically ill 83/M with type 2 diabetes mellitus, ESRD on peritoneal dialysis, hypertension, diastolic CHF, GERD presented to the ED with chest pain, hospitalized in July for same, intermittent over 2 days, radiating to left arm with some dyspnea as well, resolved with nitro, in the ED chest x-ray was unremarkable, BNP elevated at 03/06/2000, EKG noted LBBB, troponin 96, 93.  Presented to [facility], transferred to [facility] for cardiac workup    Hospital Course:   Chest pain LBBB - Cardiology input greatly appreciated -Myoview stress test in fall 2025 with no ischemia. - Ruled out for NSTEMI, underwent left heart cath yesterday which noted 80% ostial D1 disease, cardiology increased Coreg  dose, recommended to continue aspirin  and statin -Discharged home in stable condition, follow-up with [facility] heart care in 1 month   ESRD on peritoneal HD Volume overload -Renal consulted, on dialysis Sunday Monday Wednesday and Friday, - Continue with home diuretics - Dialyzed with PD in the hospital yesterday    Essential hypertension -Continue carvedilol , hydralazine , nifedipine    Type 2 diabetes mellitus  uncontrolled with hypoglycemia (HCC) - A1c is 8.7, resume home regimen of insulin , advised avoiding sodas and juices   Anemia of chronic disease GERD  - Stable, monitor   BPH (benign prostatic hyperplasia) -Continue treatment with Flomax    Anemia of chronic kidney disease - Stable    Discharge Exam: Vitals:   01/07/24 0300 01/07/24 0735  BP: (!) 139/59 (!) 130/59  Pulse: 73 74  Resp: 18 17  Temp: 99.3 F (37.4 C)   SpO2: 99%     Gen: Awake, Alert, Oriented X 3,  HEENT: no JVD Lungs: Good air movement bilaterally, CTAB CVS: S1S2/RRR Abd: soft, Non tender, non distended, BS present Extremities: No edema Skin: no new rashes on exposed skin   Discharge Instructions   Discharge Instructions     Diet - low sodium heart healthy   Complete by: As directed    Diet renal/carb modified with fluid restriction   Complete by: As directed    Discharge wound care:   Complete by: As directed    routine   Increase activity slowly   Complete by: As directed       Allergies as of 01/07/2024       Reactions   Statins Swelling, Other (See Comments)   Muscle aches   Wound Dressing Adhesive Dermatitis   Pt has rash on L arm from tape used with stitches        Medication List     TAKE these medications    acetaminophen  500 MG tablet Commonly known as: TYLENOL  Take 1,000 mg by mouth daily as needed for moderate pain.   aspirin  EC 81 MG tablet Take  81 mg by mouth every morning.   calcitRIOL  0.5 MCG capsule Commonly known as: ROCALTROL  Take 1 mcg by mouth daily.   calcitRIOL  0.25 MCG capsule Commonly known as: ROCALTROL  Take 0.25 mcg by mouth daily.   carvedilol  25 MG tablet Commonly known as: COREG  Take 1 tablet (25 mg total) by mouth 2 (two) times daily. What changed: how much to take   Dialyvite 800-Zinc 15 0.8 MG Tabs Take 1 tablet by mouth daily.   epoetin alfa 4000 UNIT/ML injection Commonly known as: EPOGEN Inject 4,000 Units into the vein every  30 (thirty) days.   ezetimibe  10 MG tablet Commonly known as: ZETIA  Take 10 mg by mouth daily.   fluticasone 50 MCG/ACT nasal spray Commonly known as: FLONASE Place 2 sprays into the nose daily.   FreeStyle Libre 2 Sensor Misc Inject 1 Device into the skin every 14 (fourteen) days.   furosemide  40 MG tablet Commonly known as: LASIX  Take 2 tablets by mouth 2 (two) times daily.   gabapentin  100 MG capsule Commonly known as: NEURONTIN  Take 100-200 mg by mouth 2 (two) times daily. Take 100 mg in the morning and 200 mg at bedtime   gemfibrozil  600 MG tablet Commonly known as: LOPID  Take 1 tablet by mouth 2 (two) times daily before a meal.   hydrALAZINE  100 MG tablet Commonly known as: APRESOLINE  Take 100 mg by mouth 3 (three) times daily.   insulin  aspart 100 UNIT/ML injection Commonly known as: novoLOG  Inject 6-18 Units into the skin 3 (three) times daily with meals. Per sliding scale, 1 UNIT FOR EVERY 10 GRAMS CARBOHYDRATES, CORRECTION   insulin  glargine 100 unit/mL Sopn Commonly known as: LANTUS  Inject 25 Units into the skin at bedtime. What changed:  how much to take when to take this   NIFEdipine  60 MG 24 hr tablet Commonly known as: ADALAT  CC Take 120 mg by mouth daily.   nitroGLYCERIN  0.4 MG SL tablet Commonly known as: NITROSTAT  Place 1 tablet (0.4 mg total) under the tongue every 5 (five) minutes as needed for chest pain.   omeprazole 20 MG capsule Commonly known as: PRILOSEC Take 20 mg by mouth daily.   ondansetron  4 MG disintegrating tablet Commonly known as: ZOFRAN -ODT Take 1 tablet (4 mg total) by mouth every 8 (eight) hours as needed for nausea or vomiting.   PreserVision AREDS 2 Caps Take 1 capsule by mouth 2 (two) times daily.   rosuvastatin  20 MG tablet Commonly known as: CRESTOR  Take 20 mg by mouth once a week.   sevelamer carbonate 800 MG tablet Commonly known as: RENVELA Take 1 tablet by mouth 3 (three) times daily with meals.    spironolactone  25 MG tablet Commonly known as: ALDACTONE  Take 25 mg by mouth daily.   tamsulosin  0.4 MG Caps capsule Commonly known as: FLOMAX  Take 0.4 mg by mouth at bedtime.               Discharge Care Instructions  (From admission, onward)           Start     Ordered   01/07/24 0000  Discharge wound care:       Comments: routine   01/07/24 1021           Allergies  Allergen Reactions   Statins Swelling and Other (See Comments)    Muscle aches   Wound Dressing Adhesive Dermatitis    Pt has rash on L arm from tape used with stitches    Follow-up Information  [name], MD Follow up.   Specialty: Internal Medicine Contact information: [address] [place] [address] [phone]                  The results of significant diagnostics from this hospitalization (including imaging, microbiology, ancillary and laboratory) are listed below for reference.    Significant Diagnostic Studies: CARDIAC CATHETERIZATION Result Date: 01/06/2024   1st Diag lesion is 80% stenosed.   LV end diastolic pressure is normal. Single vessel CAD with ostial stenosis at the ostium of the first diagonal Normal LV filling pressures. LVEDP 8 mm Hg. PCWP 12/11, mean 10 mm Hg Normal right heart pressures. PAP 34/6, mean 18 mm Hg Cardiac output 11.2 L/min, index 5.23 Plan: recommend medical management   DG Chest 2 View Result Date: 01/05/2024 EXAM: 2 VIEW(S) XRAY OF THE CHEST 01/05/2024 01:36:51 PM COMPARISON: 08/04/2023 CLINICAL HISTORY: SOB, CP FINDINGS: LUNGS AND PLEURA: EKG lead artifact projects over the right upper lobe on the frontal radiograph. Minimal left lower lobe scarring or subsegmental atelectasis is similar to the prior study. No pleural effusion. No pneumothorax. HEART AND MEDIASTINUM: No acute abnormality of the cardiac and mediastinal silhouettes. BONES AND SOFT TISSUES: No acute osseous abnormality. IMPRESSION: 1. No acute cardiopulmonary  process. Electronically signed by: Rockey Kilts MD 01/05/2024 02:10 PM EST RP Workstation: [id number]    Microbiology: No results found for this or any previous visit (from the past 240 hours).   Labs: Basic Metabolic Panel: Recent Labs  Lab 01/05/24 1441 01/06/24 0232 01/06/24 1240 01/06/24 1244 01/07/24 0252  NA 141 139 143 143  143 139  K 3.5 3.3* 3.4* 3.5  3.4* 3.2*  CL 105 106  --   --  104  CO2 19* 24  --   --  23  GLUCOSE 170* 134*  --   --  219*  BUN 60* 64*  --   --  61*  CREATININE 5.44* 5.95*  --   --  5.96*  CALCIUM  9.3 8.6*  --   --  9.1  PHOS  --  5.8*  --   --   --    Liver Function Tests: Recent Labs  Lab 01/05/24 1441 01/06/24 0232  AST 19  --   ALT 12  --   ALKPHOS 69  --   BILITOT 0.3  --   PROT 6.9  --   ALBUMIN 3.9 2.7*   No results for input(s): LIPASE, AMYLASE in the last 168 hours. No results for input(s): AMMONIA in the last 168 hours. CBC: Recent Labs  Lab 01/05/24 1441 01/06/24 0232 01/06/24 1240 01/06/24 1244 01/07/24 0252  WBC 6.5 5.4  --   --  5.0  NEUTROABS 4.5  --   --   --   --   HGB 8.8* 8.5* 8.8* 9.2*  8.5* 9.0*  HCT 27.1* 26.1* 26.0* 27.0*  25.0* 27.7*  MCV 84.4 82.9  --   --  82.9  PLT 187 183  --   --  177   Cardiac Enzymes: No results for input(s): CKTOTAL, CKMB, CKMBINDEX, TROPONINI in the last 168 hours. BNP: BNP (last 3 results) No results for input(s): BNP in the last 8760 hours.  ProBNP (last 3 results) Recent Labs    01/05/24 1441  PROBNP 2,302.0*    CBG: Recent Labs  Lab 01/06/24 0603 01/06/24 1132 01/06/24 1617 01/06/24 2151 01/07/24 0603  GLUCAP 116* 78 171* 247* 134*       Signed:  Sigurd Pac  MD.  [facility] 01/07/2024, 10:23 AM

## 2024-01-07 NOTE — Discharge Summary (Signed)
 Physician Discharge Summary  Carlos Gill FMW:[id number] DOB: Sep 16, 1940 DOA: 01/05/2024  PCP: [name], MD  Admit date: 01/05/2024 Discharge date: 01/07/2024  Time spent: 45 minutes  Recommendations for Outpatient Follow-up:  Cardiology Dr. Mallipedi in 1 month Nephrology in few weeks   Discharge Diagnoses:  Principal Problem:   Chest pain Active Problems:   ESRD on peritoneal dialysis [facility])   Essential hypertension   Hyperglycemia due to diabetes mellitus (HCC)   Acute on chronic diastolic (congestive) heart failure (HCC)   OSA on CPAP   Acute coronary syndrome [facility])   Discharge Condition: Improved  Diet recommendation: Renal diabetic  Filed Weights   01/05/24 1318 01/05/24 2347 01/07/24 0300  Weight: 94.4 kg 92.8 kg 91.7 kg    History of present illness:  Chronically ill 83/M with type 2 diabetes mellitus, ESRD on peritoneal dialysis, hypertension, diastolic CHF, GERD presented to the ED with chest pain, hospitalized in July for same, intermittent over 2 days, radiating to left arm with some dyspnea as well, resolved with nitro, in the ED chest x-ray was unremarkable, BNP elevated at 03/06/2000, EKG noted LBBB, troponin 96, 93.  Presented to [facility], transferred to [facility] for cardiac workup    Hospital Course:   Chest pain LBBB - Cardiology input greatly appreciated -Myoview stress test in fall 2025 with no ischemia. - Ruled out for NSTEMI, underwent left heart cath yesterday which noted 80% ostial D1 disease, cardiology increased Coreg  dose, recommended to continue aspirin  and statin -Discharged home in stable condition, follow-up with [facility] heart care in 1 month   ESRD on peritoneal HD Volume overload -Renal consulted, on dialysis Sunday Monday Wednesday and Friday, - Continue with home diuretics - Dialyzed with PD in the hospital yesterday    Essential hypertension -Continue carvedilol , hydralazine , nifedipine    Type 2 diabetes mellitus  uncontrolled with hypoglycemia (HCC) - A1c is 8.7, resume home regimen of insulin , advised avoiding sodas and juices   Anemia of chronic disease GERD  - Stable, monitor   BPH (benign prostatic hyperplasia) -Continue treatment with Flomax    Anemia of chronic kidney disease - Stable    Discharge Exam: Vitals:   01/07/24 0300 01/07/24 0735  BP: (!) 139/59 (!) 130/59  Pulse: 73 74  Resp: 18 17  Temp: 99.3 F (37.4 C)   SpO2: 99%     Gen: Awake, Alert, Oriented X 3,  HEENT: no JVD Lungs: Good air movement bilaterally, CTAB CVS: S1S2/RRR Abd: soft, Non tender, non distended, BS present Extremities: No edema Skin: no new rashes on exposed skin   Discharge Instructions   Discharge Instructions     Diet - low sodium heart healthy   Complete by: As directed    Diet renal/carb modified with fluid restriction   Complete by: As directed    Discharge wound care:   Complete by: As directed    routine   Increase activity slowly   Complete by: As directed       Allergies as of 01/07/2024       Reactions   Statins Swelling, Other (See Comments)   Muscle aches   Wound Dressing Adhesive Dermatitis   Pt has rash on L arm from tape used with stitches        Medication List     TAKE these medications    acetaminophen  500 MG tablet Commonly known as: TYLENOL  Take 1,000 mg by mouth daily as needed for moderate pain.   aspirin  EC 81 MG tablet Take  81 mg by mouth every morning.   calcitRIOL  0.5 MCG capsule Commonly known as: ROCALTROL  Take 1 mcg by mouth daily.   calcitRIOL  0.25 MCG capsule Commonly known as: ROCALTROL  Take 0.25 mcg by mouth daily.   carvedilol  25 MG tablet Commonly known as: COREG  Take 1 tablet (25 mg total) by mouth 2 (two) times daily. What changed: how much to take   Dialyvite 800-Zinc 15 0.8 MG Tabs Take 1 tablet by mouth daily.   epoetin alfa 4000 UNIT/ML injection Commonly known as: EPOGEN Inject 4,000 Units into the vein every  30 (thirty) days.   ezetimibe  10 MG tablet Commonly known as: ZETIA  Take 10 mg by mouth daily.   fluticasone 50 MCG/ACT nasal spray Commonly known as: FLONASE Place 2 sprays into the nose daily.   FreeStyle Libre 2 Sensor Misc Inject 1 Device into the skin every 14 (fourteen) days.   furosemide  40 MG tablet Commonly known as: LASIX  Take 2 tablets by mouth 2 (two) times daily.   gabapentin  100 MG capsule Commonly known as: NEURONTIN  Take 100-200 mg by mouth 2 (two) times daily. Take 100 mg in the morning and 200 mg at bedtime   gemfibrozil  600 MG tablet Commonly known as: LOPID  Take 1 tablet by mouth 2 (two) times daily before a meal.   hydrALAZINE  100 MG tablet Commonly known as: APRESOLINE  Take 100 mg by mouth 3 (three) times daily.   insulin  aspart 100 UNIT/ML injection Commonly known as: novoLOG  Inject 6-18 Units into the skin 3 (three) times daily with meals. Per sliding scale, 1 UNIT FOR EVERY 10 GRAMS CARBOHYDRATES, CORRECTION   insulin  glargine 100 unit/mL Sopn Commonly known as: LANTUS  Inject 25 Units into the skin at bedtime. What changed:  how much to take when to take this   NIFEdipine  60 MG 24 hr tablet Commonly known as: ADALAT  CC Take 120 mg by mouth daily.   nitroGLYCERIN  0.4 MG SL tablet Commonly known as: NITROSTAT  Place 1 tablet (0.4 mg total) under the tongue every 5 (five) minutes as needed for chest pain.   omeprazole 20 MG capsule Commonly known as: PRILOSEC Take 20 mg by mouth daily.   ondansetron  4 MG disintegrating tablet Commonly known as: ZOFRAN -ODT Take 1 tablet (4 mg total) by mouth every 8 (eight) hours as needed for nausea or vomiting.   PreserVision AREDS 2 Caps Take 1 capsule by mouth 2 (two) times daily.   rosuvastatin  20 MG tablet Commonly known as: CRESTOR  Take 20 mg by mouth once a week.   sevelamer carbonate 800 MG tablet Commonly known as: RENVELA Take 1 tablet by mouth 3 (three) times daily with meals.    spironolactone  25 MG tablet Commonly known as: ALDACTONE  Take 25 mg by mouth daily.   tamsulosin  0.4 MG Caps capsule Commonly known as: FLOMAX  Take 0.4 mg by mouth at bedtime.               Discharge Care Instructions  (From admission, onward)           Start     Ordered   01/07/24 0000  Discharge wound care:       Comments: routine   01/07/24 1021           Allergies  Allergen Reactions   Statins Swelling and Other (See Comments)    Muscle aches   Wound Dressing Adhesive Dermatitis    Pt has rash on L arm from tape used with stitches    Follow-up Information  [name], MD Follow up.   Specialty: Internal Medicine Contact information: [address] [place] [address] [phone]                  The results of significant diagnostics from this hospitalization (including imaging, microbiology, ancillary and laboratory) are listed below for reference.    Significant Diagnostic Studies: CARDIAC CATHETERIZATION Result Date: 01/06/2024   1st Diag lesion is 80% stenosed.   LV end diastolic pressure is normal. Single vessel CAD with ostial stenosis at the ostium of the first diagonal Normal LV filling pressures. LVEDP 8 mm Hg. PCWP 12/11, mean 10 mm Hg Normal right heart pressures. PAP 34/6, mean 18 mm Hg Cardiac output 11.2 L/min, index 5.23 Plan: recommend medical management   DG Chest 2 View Result Date: 01/05/2024 EXAM: 2 VIEW(S) XRAY OF THE CHEST 01/05/2024 01:36:51 PM COMPARISON: 08/04/2023 CLINICAL HISTORY: SOB, CP FINDINGS: LUNGS AND PLEURA: EKG lead artifact projects over the right upper lobe on the frontal radiograph. Minimal left lower lobe scarring or subsegmental atelectasis is similar to the prior study. No pleural effusion. No pneumothorax. HEART AND MEDIASTINUM: No acute abnormality of the cardiac and mediastinal silhouettes. BONES AND SOFT TISSUES: No acute osseous abnormality. IMPRESSION: 1. No acute cardiopulmonary  process. Electronically signed by: Rockey Kilts MD 01/05/2024 02:10 PM EST RP Workstation: [id number]    Microbiology: No results found for this or any previous visit (from the past 240 hours).   Labs: Basic Metabolic Panel: Recent Labs  Lab 01/05/24 1441 01/06/24 0232 01/06/24 1240 01/06/24 1244 01/07/24 0252  NA 141 139 143 143  143 139  K 3.5 3.3* 3.4* 3.5  3.4* 3.2*  CL 105 106  --   --  104  CO2 19* 24  --   --  23  GLUCOSE 170* 134*  --   --  219*  BUN 60* 64*  --   --  61*  CREATININE 5.44* 5.95*  --   --  5.96*  CALCIUM  9.3 8.6*  --   --  9.1  PHOS  --  5.8*  --   --   --    Liver Function Tests: Recent Labs  Lab 01/05/24 1441 01/06/24 0232  AST 19  --   ALT 12  --   ALKPHOS 69  --   BILITOT 0.3  --   PROT 6.9  --   ALBUMIN 3.9 2.7*   No results for input(s): LIPASE, AMYLASE in the last 168 hours. No results for input(s): AMMONIA in the last 168 hours. CBC: Recent Labs  Lab 01/05/24 1441 01/06/24 0232 01/06/24 1240 01/06/24 1244 01/07/24 0252  WBC 6.5 5.4  --   --  5.0  NEUTROABS 4.5  --   --   --   --   HGB 8.8* 8.5* 8.8* 9.2*  8.5* 9.0*  HCT 27.1* 26.1* 26.0* 27.0*  25.0* 27.7*  MCV 84.4 82.9  --   --  82.9  PLT 187 183  --   --  177   Cardiac Enzymes: No results for input(s): CKTOTAL, CKMB, CKMBINDEX, TROPONINI in the last 168 hours. BNP: BNP (last 3 results) No results for input(s): BNP in the last 8760 hours.  ProBNP (last 3 results) Recent Labs    01/05/24 1441  PROBNP 2,302.0*    CBG: Recent Labs  Lab 01/06/24 0603 01/06/24 1132 01/06/24 1617 01/06/24 2151 01/07/24 0603  GLUCAP 116* 78 171* 247* 134*       Signed:  Sigurd Pac  MD.  [facility] 01/07/2024, 1:09 PM

## 2024-01-08 ENCOUNTER — Encounter (HOSPITAL_COMMUNITY): Payer: Self-pay | Admitting: Cardiology

## 2024-01-08 LAB — LIPOPROTEIN A (LPA): Lipoprotein (a): 77.3 nmol/L — ABNORMAL HIGH (ref <75.0)

## 2024-01-09 NOTE — Progress Notes (Signed)
 Late Note Entry- January 09, 2024  Pt d/c on Saturday. Contacted [facility] home therapy dept this morning and left a message regarding pt's d/c date.   Randine Mungo Dialysis Navigator [phone]  Addendum on 01/10/24 at 10:35 am: Spoke to staff at [facility] therapy dept. Staff aware of pt's d/c and have no further needs at this time.

## 2024-02-01 ENCOUNTER — Ambulatory Visit: Admitting: Student

## 2024-03-03 ENCOUNTER — Other Ambulatory Visit: Payer: Self-pay

## 2024-03-03 ENCOUNTER — Emergency Department (HOSPITAL_COMMUNITY)
Admission: EM | Admit: 2024-03-03 | Discharge: 2024-03-04 | Disposition: A | Attending: Emergency Medicine | Admitting: Emergency Medicine

## 2024-03-03 ENCOUNTER — Emergency Department (HOSPITAL_COMMUNITY)

## 2024-03-03 DIAGNOSIS — R600 Localized edema: Secondary | ICD-10-CM

## 2024-03-03 DIAGNOSIS — N3000 Acute cystitis without hematuria: Secondary | ICD-10-CM

## 2024-03-03 LAB — URINALYSIS, ROUTINE W REFLEX MICROSCOPIC
Bilirubin Urine: NEGATIVE
Glucose, UA: 50 mg/dL — AB
Hgb urine dipstick: NEGATIVE
Ketones, ur: NEGATIVE mg/dL
Nitrite: NEGATIVE
Protein, ur: 30 mg/dL — AB
Specific Gravity, Urine: 1.005 (ref 1.005–1.030)
pH: 5 (ref 5.0–8.0)

## 2024-03-03 LAB — TROPONIN T, HIGH SENSITIVITY
Troponin T High Sensitivity: 98 ng/L — ABNORMAL HIGH (ref 0–19)
Troponin T High Sensitivity: 97 ng/L — ABNORMAL HIGH (ref 0–19)

## 2024-03-03 LAB — COMPREHENSIVE METABOLIC PANEL WITH GFR
ALT: 13 U/L (ref 0–44)
AST: 16 U/L (ref 15–41)
Albumin: 3.4 g/dL — ABNORMAL LOW (ref 3.5–5.0)
Alkaline Phosphatase: 52 U/L (ref 38–126)
Anion gap: 17 — ABNORMAL HIGH (ref 5–15)
BUN: 55 mg/dL — ABNORMAL HIGH (ref 8–23)
CO2: 22 mmol/L (ref 22–32)
Calcium: 9.6 mg/dL (ref 8.9–10.3)
Chloride: 102 mmol/L (ref 98–111)
Creatinine, Ser: 6.68 mg/dL — ABNORMAL HIGH (ref 0.61–1.24)
GFR, Estimated: 8 mL/min — ABNORMAL LOW
Glucose, Bld: 142 mg/dL — ABNORMAL HIGH (ref 70–99)
Potassium: 3.2 mmol/L — ABNORMAL LOW (ref 3.5–5.1)
Sodium: 140 mmol/L (ref 135–145)
Total Bilirubin: <0.2 mg/dL (ref 0.0–1.2)
Total Protein: 6.3 g/dL — ABNORMAL LOW (ref 6.5–8.1)

## 2024-03-03 LAB — CBC WITH DIFFERENTIAL/PLATELET
Abs Immature Granulocytes: 0.01 10*3/uL (ref 0.00–0.07)
Basophils Absolute: 0 10*3/uL (ref 0.0–0.1)
Basophils Relative: 0 %
Eosinophils Absolute: 0.2 10*3/uL (ref 0.0–0.5)
Eosinophils Relative: 4 %
HCT: 31.2 % — ABNORMAL LOW (ref 39.0–52.0)
Hemoglobin: 9.9 g/dL — ABNORMAL LOW (ref 13.0–17.0)
Immature Granulocytes: 0 %
Lymphocytes Relative: 23 %
Lymphs Abs: 1.3 10*3/uL (ref 0.7–4.0)
MCH: 28.8 pg (ref 26.0–34.0)
MCHC: 31.7 g/dL (ref 30.0–36.0)
MCV: 90.7 fL (ref 80.0–100.0)
Monocytes Absolute: 0.5 10*3/uL (ref 0.1–1.0)
Monocytes Relative: 10 %
Neutro Abs: 3.5 10*3/uL (ref 1.7–7.7)
Neutrophils Relative %: 63 %
Platelets: 168 10*3/uL (ref 150–400)
RBC: 3.44 MIL/uL — ABNORMAL LOW (ref 4.22–5.81)
RDW: 13.9 % (ref 11.5–15.5)
WBC: 5.5 10*3/uL (ref 4.0–10.5)
nRBC: 0 % (ref 0.0–0.2)

## 2024-03-03 LAB — PRO BRAIN NATRIURETIC PEPTIDE: Pro Brain Natriuretic Peptide: 2157 pg/mL — ABNORMAL HIGH

## 2024-03-03 LAB — CBG MONITORING, ED
Glucose-Capillary: 167 mg/dL — ABNORMAL HIGH (ref 70–99)
Glucose-Capillary: 41 mg/dL — CL (ref 70–99)
Glucose-Capillary: 124 mg/dL — ABNORMAL HIGH (ref 70–99)
Glucose-Capillary: 127 mg/dL — ABNORMAL HIGH (ref 70–99)

## 2024-03-03 MED ORDER — CEPHALEXIN 500 MG PO CAPS: 500.0000 mg | ORAL_CAPSULE | Freq: Two times a day (BID) | ORAL | 0 refills | Status: AC

## 2024-03-03 MED ORDER — DEXTROSE 50 % IV SOLN
1.0000 | Freq: Once | INTRAVENOUS | Status: AC
  Administered 2024-03-03: 50 mL via INTRAVENOUS
  Filled 2024-03-03: qty 50

## 2024-03-03 MED ORDER — CEPHALEXIN 500 MG PO CAPS
500.0000 mg | ORAL_CAPSULE | Freq: Once | ORAL | Status: AC
  Administered 2024-03-03: 500 mg via ORAL
  Filled 2024-03-03: qty 1

## 2024-03-03 NOTE — ED Provider Notes (Signed)
 " [place] EMERGENCY DEPARTMENT AT [facility] Provider Note   CSN: [id number] Arrival date & time: 03/03/24  1754     Patient presents with: Leg Swelling   Carlos Gill is a 84 y.o. male.  84 year old male presents Emergency Department via EMS, family reports he was hard to arouse when waking today and they were concerned with increased leg swelling and abdominal swelling.  Patient reports he feels perfectly fine and has no complaints.  He does get dialysis 6 days a week and missed his dialysis today.  He usually does dialysis Monday through Saturday with a rest day on Sunday.  Patient receives peritoneal dialysis at home.  Patient has significant history of CKD, diabetes, hypertension.      Prior to Admission medications  Medication Sig Start Date End Date Taking? Authorizing Provider  cephALEXin  (KEFLEX ) 500 MG capsule Take 1 capsule (500 mg total) by mouth 2 (two) times daily. 03/03/24  Yes Myriam Fonda RAMAN, PA-C  acetaminophen  (TYLENOL ) 500 MG tablet Take 1,000 mg by mouth daily as needed for moderate pain.    [provider]  aspirin  EC 81 MG tablet Take 81 mg by mouth every morning.    [provider]  B Complex-C-Zn-Folic Acid  (DIALYVITE 800-ZINC 15) 0.8 MG TABS Take 1 tablet by mouth daily. 11/15/23   [provider]  calcitRIOL  (ROCALTROL ) 0.25 MCG capsule Take 0.25 mcg by mouth daily. 11/15/23   [provider]  calcitRIOL  (ROCALTROL ) 0.5 MCG capsule Take 1 mcg by mouth daily. 02/13/21   [provider]  carvedilol  (COREG ) 25 MG tablet Take 1 tablet (25 mg total) by mouth 2 (two) times daily. 01/07/24 04/06/24  Fairy Frames, MD  Continuous Glucose Sensor (FREESTYLE LIBRE 2 SENSOR) MISC Inject 1 Device into the skin every 14 (fourteen) days. 06/24/21   [provider]  epoetin alfa (EPOGEN) 4000 UNIT/ML injection Inject 4,000 Units into the vein every 30 (thirty) days.    [provider]  ezetimibe  (ZETIA ) 10  MG tablet Take 10 mg by mouth daily.    [provider]  fluticasone (FLONASE) 50 MCG/ACT nasal spray Place 2 sprays into the nose daily. 11/11/23 11/10/24  [provider]  furosemide  (LASIX ) 40 MG tablet Take 2 tablets by mouth 2 (two) times daily. 11/09/23   [provider]  gabapentin  (NEURONTIN ) 100 MG capsule Take 100-200 mg by mouth 2 (two) times daily. Take 100 mg in the morning and 200 mg at bedtime 02/08/19   [provider]  gemfibrozil  (LOPID ) 600 MG tablet Take 1 tablet by mouth 2 (two) times daily before a meal. 11/09/23   [provider]  hydrALAZINE  (APRESOLINE ) 100 MG tablet Take 100 mg by mouth 3 (three) times daily. 08/19/22   [provider]  insulin  aspart (NOVOLOG ) 100 UNIT/ML injection Inject 6-18 Units into the skin 3 (three) times daily with meals. Per sliding scale, 1 UNIT FOR EVERY 10 GRAMS CARBOHYDRATES, CORRECTION    [provider]  insulin  glargine (LANTUS ) 100 unit/mL SOPN Inject 25 Units into the skin at bedtime. Patient taking differently: Inject 30 Units into the skin every morning. 08/08/23   Rai, Nydia POUR, MD  Multiple Vitamins-Minerals (PRESERVISION AREDS 2) CAPS Take 1 capsule by mouth 2 (two) times daily.    [provider]  NIFEdipine  (ADALAT  CC) 60 MG 24 hr tablet Take 120 mg by mouth daily.    [provider]  nitroGLYCERIN  (NITROSTAT ) 0.4 MG SL tablet Place 1 tablet (  0.4 mg total) under the tongue every 5 (five) minutes as needed for chest pain. 08/08/23   Rai, Nydia POUR, MD  omeprazole (PRILOSEC) 20 MG capsule Take 20 mg by mouth daily.    [provider]  ondansetron  (ZOFRAN -ODT) 4 MG disintegrating tablet Take 1 tablet (4 mg total) by mouth every 8 (eight) hours as needed for nausea or vomiting. 08/08/23   Rai, Ripudeep POUR, MD  rosuvastatin  (CRESTOR ) 20 MG tablet Take 20 mg by mouth once a week. 08/30/23   [provider]  sevelamer carbonate (RENVELA) 800 MG tablet  Take 1 tablet by mouth 3 (three) times daily with meals. Patient not taking: Reported on 01/05/2024 12/27/23 12/26/24  [provider]  spironolactone  (ALDACTONE ) 25 MG tablet Take 25 mg by mouth daily.    [provider]  tamsulosin  (FLOMAX ) 0.4 MG CAPS capsule Take 0.4 mg by mouth at bedtime.    [provider]    Allergies: Statins and Wound dressing adhesive    Review of Systems  Constitutional:  Positive for fatigue.  Cardiovascular:  Positive for leg swelling.  All other systems reviewed and are negative.   Updated Vital Signs BP 128/74 (BP Location: Left Arm)   Pulse 74   Temp 98 F (36.7 C) (Oral)   Resp (!) 22   Ht 6' (1.829 m)   Wt 99.8 kg   SpO2 97%   BMI 29.84 kg/m   Physical Exam Vitals and nursing note reviewed.  Constitutional:      General: He is in acute distress.     Appearance: Normal appearance. He is not ill-appearing or toxic-appearing.  HENT:     Head: Normocephalic and atraumatic.     Nose: Nose normal.  Eyes:     Extraocular Movements: Extraocular movements intact.     Conjunctiva/sclera: Conjunctivae normal.     Pupils: Pupils are equal, round, and reactive to light.  Cardiovascular:     Rate and Rhythm: Normal rate and regular rhythm.  Pulmonary:     Effort: Pulmonary effort is normal. No respiratory distress.  Abdominal:     General: Abdomen is flat.     Palpations: Abdomen is soft.     Tenderness: There is no abdominal tenderness.     Comments: No ascites  Musculoskeletal:        General: Swelling present. Normal range of motion.     Cervical back: Normal range of motion.     Comments: +1 pitting edema noted in lower extremities.  Skin:    General: Skin is warm.     Capillary Refill: Capillary refill takes less than 2 seconds.  Neurological:     General: No focal deficit present.     Mental Status: He is alert.  Psychiatric:        Mood and Affect: Mood normal.        Behavior: Behavior normal.      (all labs ordered are listed, but only abnormal results are displayed) Labs Reviewed  COMPREHENSIVE METABOLIC PANEL WITH GFR - Abnormal; Notable for the following components:      Result Value   Potassium 3.2 (*)    Glucose, Bld 142 (*)    BUN 55 (*)    Creatinine, Ser 6.68 (*)    Total Protein 6.3 (*)    Albumin 3.4 (*)    GFR, Estimated 8 (*)    Anion gap 17 (*)    All other components within normal limits  CBC WITH DIFFERENTIAL/PLATELET - Abnormal;  Notable for the following components:   RBC 3.44 (*)    Hemoglobin 9.9 (*)    HCT 31.2 (*)    All other components within normal limits  PRO BRAIN NATRIURETIC PEPTIDE - Abnormal; Notable for the following components:   Pro Brain Natriuretic Peptide 2,157.0 (*)    All other components within normal limits  URINALYSIS, ROUTINE W REFLEX MICROSCOPIC - Abnormal; Notable for the following components:   APPearance HAZY (*)    Glucose, UA 50 (*)    Protein, ur 30 (*)    Leukocytes,Ua LARGE (*)    Bacteria, UA RARE (*)    All other components within normal limits  CBG MONITORING, ED - Abnormal; Notable for the following components:   Glucose-Capillary 41 (*)    All other components within normal limits  CBG MONITORING, ED - Abnormal; Notable for the following components:   Glucose-Capillary 167 (*)    All other components within normal limits  CBG MONITORING, ED - Abnormal; Notable for the following components:   Glucose-Capillary 124 (*)    All other components within normal limits  CBG MONITORING, ED - Abnormal; Notable for the following components:   Glucose-Capillary 127 (*)    All other components within normal limits  TROPONIN T, HIGH SENSITIVITY - Abnormal; Notable for the following components:   Troponin T High Sensitivity 98 (*)    All other components within normal limits  TROPONIN T, HIGH SENSITIVITY - Abnormal; Notable for the following components:   Troponin T High Sensitivity 97 (*)    All other components  within normal limits  CBG MONITORING, ED    EKG: EKG Interpretation Date/Time:  Saturday March 03 2024 18:36:05 EST Ventricular Rate:  73 PR Interval:  162 QRS Duration:  160 QT Interval:  435 QTC Calculation: 480 R Axis:   -74  Text Interpretation: Sinus rhythm Left bundle branch block similar to prior Confirmed by Elnor Savant ([id number]) on 03/03/2024 6:41:52 PM  Radiology: [facility] Chest Port 1 View Result Date: 03/03/2024 CLINICAL DATA:  Bilateral leg pain and swelling. EXAM: PORTABLE CHEST 1 VIEW COMPARISON:  January 05, 2024 FINDINGS: The cardiac silhouette is mildly enlarged. Low lung volumes are noted. There is mild prominence of the central pulmonary vasculature. Mild atelectatic changes are suspected within the bilateral lung bases, left slightly greater than right. No pleural effusion or pneumothorax is identified. The visualized skeletal structures are unremarkable. IMPRESSION: Stable cardiomegaly with mild bibasilar atelectasis. Electronically Signed   By: Suzen Dials M.D.   On: 03/03/2024 19:46     Procedures   Medications Ordered in the ED  dextrose  50 % solution 50 mL (50 mLs Intravenous Given 03/03/24 2122)  cephALEXin  (KEFLEX ) capsule 500 mg (500 mg Oral Given 03/03/24 2237)    Clinical Course as of 03/03/24 2250  Sat Mar 03, 2024  2113 Glucose-Capillary(!!): 41 Will give po, d50, recheck cbg [SG]    Clinical Course User Index [SG] Elnor Savant LABOR, DO   84 y.o. male presents to the ED with complaints of lower extremity edema missed dialysis,  The differential diagnosis includes CHF exacerbation, electrolyte abnormality, ACS, pulmonary edema, pneumonia, UTI, AKI (Ddx)  On arrival pt is nontoxic, vitals unremarkable. Exam significant for mild edema in lower extremities.  No other concerning findings.  Additional history obtained from last echo in July 2025 notable for ejection fraction of 55-60.  Lab Tests:  CMP CBC UA CBG troponin BNP Troponin 98, delta 97.   Similar to previous labs likely secondary  to CKD. BNP elevated 2100 similar to previous labs. CBC notable for hemoglobin 9.9 improved from last month. CMP notable for mild hypokalemia 3.2.  Creatinine is 6.6 and GFR 8.  Patient is dialysis patient and labs are similar to previous. UA notable for leukocytes and 20-50 white blood cells.  Imaging Studies ordered:  I ordered imaging studies which included chest x-ray.  Notable for Stable cardiomegaly with mild bibasilar atelectasis.   ED Course:   Initial evaluation patient sitting comfortably in ED bed in no acute distress nontoxic-appearing.  Patient denies any complaints or symptoms.  He does endorse passing dialysis today and typically has a skip day tomorrow.  Family had concern for difficulty to arouse today along with increased edema.  Mild edema noted to lower extremity.  Will obtain labs chest x-ray.  Labs consistent with previous lab work and CKD.  Chest x-ray shows stable cardiomegaly and atelectasis.  UA concerning for UTI, no reported urinary symptoms.  CBC was noted at 41.  Patient was given snacks and D50.  Elevated at 167 repeat was 124.  CBG was stable at 127 approximately 30 minutes later.  Patient was advised to follow-up with primary care and monitor glucose closely.  Patient was advised of strict return precautions and comfortable with discharge at this time.  Portions of this note were generated with Scientist, clinical (histocompatibility and immunogenetics). Dictation errors may occur despite best attempts at proofreading.   Final diagnoses:  Leg edema  Acute cystitis without hematuria    ED Discharge Orders          Ordered    cephALEXin  (KEFLEX ) 500 MG capsule  2 times daily        03/03/24 2246               Myriam Fonda RAMAN, [place] 03/03/24 2252  "

## 2024-03-03 NOTE — ED Notes (Signed)
 Pt grandson will be enroute to pick him up. This medic advised the grandson that we will discharge the pt when he arrives.

## 2024-03-03 NOTE — ED Notes (Signed)
 Pt was given 8oz of juice and also gave him some graham crackers after CBG check with value of 41. Pt was aox4 at 41.

## 2024-03-03 NOTE — ED Triage Notes (Signed)
 PT BIB EMS from home with complaints of bilateral leg and abd swelling. Pt gets dialysis at home 6 days a week. Has not had dialysis today. PT denies any pain.   VS 168/71 CBG 168

## 2024-03-03 NOTE — Discharge Instructions (Addendum)
 Imaging lab work and exam are overall reassuring.  Please continue to do dialysis as prescribed.  You can use compression stockings and elevation as needed for lower extremity edema.  Please monitor blood glucose closely and follow-up with your primary care for reevaluation of glucose control.  If you experience any returning new or worsening symptoms please return emergency department for further evaluation.  Examples of symptoms would be shortness of breath, chest pain, dizziness, or fainting.   Your urine had evidence of UTI.  I am going to start you on an antibiotic.  Please follow-up with your primary care doctor for reassessment.

## 2024-03-04 LAB — CBG MONITORING, ED: Glucose-Capillary: 139 mg/dL — ABNORMAL HIGH (ref 70–99)

## 2024-03-04 NOTE — ED Notes (Signed)
 Attempted to call grandson no answer. Voicemail left.

## 2024-03-04 NOTE — ED Notes (Signed)
 Grandson called no answer.
